# Patient Record
Sex: Female | Born: 1940
Health system: Southern US, Community
[De-identification: ages and names within clinical notes are randomized; demographics above are authoritative.]

## PROBLEM LIST (undated history)

## (undated) DIAGNOSIS — K219 Gastro-esophageal reflux disease without esophagitis: Secondary | ICD-10-CM

## (undated) DIAGNOSIS — C8590 Non-Hodgkin lymphoma, unspecified, unspecified site: Secondary | ICD-10-CM

## (undated) DIAGNOSIS — C8290 Follicular lymphoma, unspecified, unspecified site: Secondary | ICD-10-CM

## (undated) DIAGNOSIS — E785 Hyperlipidemia, unspecified: Secondary | ICD-10-CM

## (undated) DIAGNOSIS — I351 Nonrheumatic aortic (valve) insufficiency: Secondary | ICD-10-CM

## (undated) DIAGNOSIS — Z8589 Personal history of malignant neoplasm of other organs and systems: Secondary | ICD-10-CM

## (undated) DIAGNOSIS — I5189 Other ill-defined heart diseases: Secondary | ICD-10-CM

## (undated) DIAGNOSIS — Z87442 Personal history of urinary calculi: Secondary | ICD-10-CM

## (undated) DIAGNOSIS — I071 Rheumatic tricuspid insufficiency: Secondary | ICD-10-CM

## (undated) DIAGNOSIS — Z85828 Personal history of other malignant neoplasm of skin: Secondary | ICD-10-CM

## (undated) DIAGNOSIS — I340 Nonrheumatic mitral (valve) insufficiency: Secondary | ICD-10-CM

## (undated) DIAGNOSIS — F03A4 Unspecified dementia, mild, with anxiety: Secondary | ICD-10-CM

## (undated) DIAGNOSIS — C44311 Basal cell carcinoma of skin of nose: Secondary | ICD-10-CM

## (undated) DIAGNOSIS — J40 Bronchitis, not specified as acute or chronic: Secondary | ICD-10-CM

## (undated) DIAGNOSIS — Z86018 Personal history of other benign neoplasm: Secondary | ICD-10-CM

## (undated) DIAGNOSIS — I10 Essential (primary) hypertension: Secondary | ICD-10-CM

## (undated) DIAGNOSIS — K649 Unspecified hemorrhoids: Secondary | ICD-10-CM

## (undated) DIAGNOSIS — Z9289 Personal history of other medical treatment: Secondary | ICD-10-CM

## (undated) DIAGNOSIS — Z860100 Personal history of colon polyps, unspecified: Secondary | ICD-10-CM

## (undated) DIAGNOSIS — R011 Cardiac murmur, unspecified: Secondary | ICD-10-CM

## (undated) DIAGNOSIS — I4891 Unspecified atrial fibrillation: Secondary | ICD-10-CM

## (undated) DIAGNOSIS — G3184 Mild cognitive impairment, so stated: Secondary | ICD-10-CM

## (undated) DIAGNOSIS — C761 Malignant neoplasm of thorax: Secondary | ICD-10-CM

## (undated) HISTORY — DX: Basal cell carcinoma of skin of nose: C44.311

## (undated) HISTORY — DX: Personal history of colon polyps, unspecified: Z86.0100

## (undated) HISTORY — DX: Cardiac murmur, unspecified: R01.1

## (undated) HISTORY — DX: Bronchitis, not specified as acute or chronic: J40

## (undated) HISTORY — PX: OOPHORECTOMY: SHX86

## (undated) HISTORY — DX: Personal history of other malignant neoplasm of skin: Z85.828

## (undated) HISTORY — DX: Gastro-esophageal reflux disease without esophagitis: K21.9

## (undated) HISTORY — DX: Essential (primary) hypertension: I10

## (undated) HISTORY — DX: Personal history of other medical treatment: Z92.89

## (undated) HISTORY — DX: Personal history of colonic polyps: Z86.010

## (undated) HISTORY — DX: Unspecified hemorrhoids: K64.9

## (undated) HISTORY — DX: Personal history of urinary calculi: Z87.442

## (undated) HISTORY — DX: Malignant neoplasm of thorax: C76.1

## (undated) HISTORY — DX: Hyperlipidemia, unspecified: E78.5

## (undated) HISTORY — DX: Personal history of other benign neoplasm: Z86.018

## (undated) HISTORY — DX: Personal history of malignant neoplasm of other organs and systems: Z85.89

---

## 1969-05-15 HISTORY — PX: TONSILLECTOMY: SHX5217

## 1973-05-15 HISTORY — PX: TUBAL LIGATION: SHX77

## 1982-05-15 HISTORY — PX: ABDOMINAL HYSTERECTOMY: SHX81

## 1999-11-13 HISTORY — PX: VAGINAL PROLAPSE REPAIR: SHX830

## 2001-05-15 HISTORY — PX: COLONOSCOPY: SHX174

## 2001-12-05 DIAGNOSIS — Z86018 Personal history of other benign neoplasm: Secondary | ICD-10-CM

## 2001-12-05 HISTORY — DX: Personal history of other benign neoplasm: Z86.018

## 2002-05-15 DIAGNOSIS — C761 Malignant neoplasm of thorax: Secondary | ICD-10-CM

## 2002-05-15 HISTORY — PX: OTHER SURGICAL HISTORY: SHX169

## 2002-05-15 HISTORY — DX: Malignant neoplasm of thorax: C76.1

## 2003-05-16 HISTORY — PX: LITHOTRIPSY: SUR834

## 2003-06-01 HISTORY — PX: CHOLECYSTECTOMY: SHX55

## 2004-05-19 ENCOUNTER — Ambulatory Visit: Payer: Self-pay | Admitting: Urology

## 2004-05-22 ENCOUNTER — Emergency Department: Payer: Self-pay | Admitting: Internal Medicine

## 2004-11-07 ENCOUNTER — Ambulatory Visit: Payer: Self-pay | Admitting: Gastroenterology

## 2004-11-09 ENCOUNTER — Ambulatory Visit: Payer: Self-pay | Admitting: Gastroenterology

## 2004-12-08 ENCOUNTER — Ambulatory Visit: Payer: Self-pay | Admitting: Surgery

## 2005-02-07 DIAGNOSIS — Z8589 Personal history of malignant neoplasm of other organs and systems: Secondary | ICD-10-CM

## 2005-02-07 HISTORY — DX: Personal history of malignant neoplasm of other organs and systems: Z85.89

## 2005-10-31 ENCOUNTER — Ambulatory Visit: Payer: Self-pay | Admitting: Internal Medicine

## 2006-08-25 ENCOUNTER — Emergency Department: Payer: Self-pay | Admitting: Emergency Medicine

## 2006-11-08 ENCOUNTER — Ambulatory Visit: Payer: Self-pay | Admitting: Internal Medicine

## 2007-05-16 LAB — HM COLONOSCOPY: HM Colonoscopy: NORMAL

## 2007-07-31 ENCOUNTER — Ambulatory Visit: Payer: Self-pay | Admitting: Unknown Physician Specialty

## 2007-11-11 ENCOUNTER — Ambulatory Visit: Payer: Self-pay | Admitting: Gastroenterology

## 2008-02-11 ENCOUNTER — Ambulatory Visit: Payer: Self-pay | Admitting: Internal Medicine

## 2008-02-13 ENCOUNTER — Ambulatory Visit: Payer: Self-pay | Admitting: Internal Medicine

## 2008-05-15 HISTORY — PX: BLADDER SUSPENSION: SHX72

## 2008-11-20 ENCOUNTER — Ambulatory Visit: Payer: Self-pay | Admitting: Urology

## 2008-11-30 ENCOUNTER — Ambulatory Visit: Payer: Self-pay | Admitting: Urology

## 2009-10-20 ENCOUNTER — Ambulatory Visit: Payer: Self-pay | Admitting: Internal Medicine

## 2010-10-11 ENCOUNTER — Ambulatory Visit: Payer: Self-pay | Admitting: Internal Medicine

## 2010-10-26 ENCOUNTER — Ambulatory Visit: Payer: Self-pay | Admitting: Internal Medicine

## 2011-03-31 ENCOUNTER — Emergency Department: Payer: Self-pay | Admitting: *Deleted

## 2011-05-16 LAB — HM MAMMOGRAPHY: HM Mammogram: NORMAL

## 2011-06-17 ENCOUNTER — Ambulatory Visit: Payer: Self-pay | Admitting: Sports Medicine

## 2012-01-23 ENCOUNTER — Ambulatory Visit: Payer: Self-pay | Admitting: Internal Medicine

## 2012-04-03 ENCOUNTER — Ambulatory Visit: Payer: Self-pay | Admitting: Family Medicine

## 2012-09-30 ENCOUNTER — Ambulatory Visit: Payer: Self-pay | Admitting: General Surgery

## 2012-10-24 ENCOUNTER — Encounter: Payer: Self-pay | Admitting: General Surgery

## 2012-10-24 ENCOUNTER — Ambulatory Visit (INDEPENDENT_AMBULATORY_CARE_PROVIDER_SITE_OTHER): Payer: Medicare Other | Admitting: General Surgery

## 2012-10-24 VITALS — BP 140/78 | HR 72 | Resp 12 | Ht 63.0 in | Wt 146.0 lb

## 2012-10-24 DIAGNOSIS — Z1211 Encounter for screening for malignant neoplasm of colon: Secondary | ICD-10-CM | POA: Insufficient documentation

## 2012-10-24 DIAGNOSIS — Z86010 Personal history of colonic polyps: Secondary | ICD-10-CM

## 2012-10-24 LAB — POC HEMOCCULT BLD/STL (OFFICE/1-CARD/DIAGNOSTIC): Fecal Occult Blood, POC: NEGATIVE

## 2012-10-24 NOTE — Progress Notes (Signed)
Patient ID: Whitney Molina, female   DOB: [birth year], 72 y.o.   MRN: [id number]  Chief Complaint  Patient presents with  . Other    screening colonoscopy    HPI Whitney Molina is a 72 y.o. female who presents for a consult regarding a screening colonoscopy. Patient had her last colonoscopy in 2009 done by Dr. Mechele Collin. The patient reports that she has had a long history of intermittent difficulty with bowel movements and occasional hemorrhoids. On rare occasion she will see a spot of blood on the toilet tissue when wiping. This is markedly improved over last 2 years and she has begun a Gen. Exercise program. She had previously undergone hemorrhoid surgery with Dr. Katrinka Blazing.    HPI  Past Medical History  Diagnosis Date  . Bronchitis   . Hypertension   . Hyperlipidemia   . Cancer     skin  . Hemorrhoids     Past Surgical History  Procedure Laterality Date  . Tonsillectomy  1971  . Tubal ligation  1975  . Abdominal hysterectomy  1984  . Vaginal prolapse repair  July 2001    Pelvic Prolapse  . Cholecystectomy  06-01-03  . Lithotripsy  2005  . Bladder suspension  2010  . Colonoscopy  2009    Family History  Problem Relation Age of Onset  . Cancer Mother     breast   . Stroke Mother   . Stroke Father   . Cancer Brother     bladder  . Stroke Brother     Social History History  Substance Use Topics  . Smoking status: Never Smoker   . Smokeless tobacco: Never Used  . Alcohol Use: No    No Known Allergies  Current Outpatient Prescriptions  Medication Sig Dispense Refill  . amLODipine (NORVASC) 5 MG tablet Take 5 mg by mouth daily.      Marland Kitchen atorvastatin (LIPITOR) 20 MG tablet Take 20 mg by mouth 2 (two) times a week.      . Coenzyme Q10 (CO Q 10) 100 MG CAPS Take 1 capsule by mouth daily.      Marland Kitchen docusate calcium (SURFAK) 240 MG capsule Take 240 mg by mouth daily.      Marland Kitchen escitalopram (LEXAPRO) 10 MG tablet Take 10 mg by mouth daily.      . fish oil-omega-3  fatty acids 1000 MG capsule Take 2 g by mouth daily.      . hydrochlorothiazide (HYDRODIURIL) 25 MG tablet Take 25 mg by mouth as needed.      Marland Kitchen omeprazole (PRILOSEC) 40 MG capsule Take 40 mg by mouth daily.      . Probiotic Product (PROBIOTIC DAILY PO) Take 1 tablet by mouth daily.      . Red Yeast Rice Extract 600 MG CAPS Take 2 capsules by mouth daily.       No current facility-administered medications for this visit.    Review of Systems Review of Systems  Constitutional: Negative.   Respiratory: Negative.   Cardiovascular: Negative.   Gastrointestinal: Negative.     Blood pressure 140/78, pulse 72, resp. rate 12, height 5\' 3"  (1.6 m), weight 146 lb (66.225 kg).  Physical Exam Physical Exam  Constitutional: She appears well-developed and well-nourished.  Cardiovascular: Normal rate, regular rhythm and normal heart sounds.   Pulmonary/Chest: Breath sounds normal.  Lymphadenopathy:    She has no cervical adenopathy.  The external anal examination was unremarkable. Digital rectal exam was undertaken without discomfort. Normal sphincter  tone. No masses. Hemoccult-negative stool.  Data Reviewed The colonoscopy of 2009 showed a single polyp in the rectum. This was a hyperplastic polyp. The 2006 hemorrhoidectomy and polyp removal showed a hyperplastic polyp as well as hemorrhoidal varices with chronic inflammation.  Assessment    Hyperplastic polyps, no history of adenomatous polyps. No family history of colon cancer.     Plan    Unless the patient notices a significant change in her rare episodes of bright red blood on the toilet paper or change in her bowel function, a screening colonoscopy would not be indicated into 2019. She was encouraged to call if any changes occur in regards to her bowel function.        Whitney Molina 10/24/2012, 8:41 PM

## 2012-10-24 NOTE — Patient Instructions (Addendum)
Patient to have a colonoscopy  2019

## 2013-03-30 ENCOUNTER — Emergency Department: Payer: Self-pay | Admitting: Emergency Medicine

## 2013-03-30 LAB — URINALYSIS, COMPLETE
Bilirubin,UR: NEGATIVE
Blood: NEGATIVE
Glucose,UR: NEGATIVE mg/dL (ref 0–75)
Ketone: NEGATIVE
Leukocyte Esterase: NEGATIVE
Ph: 7 (ref 4.5–8.0)
Protein: NEGATIVE
RBC,UR: 1 /HPF (ref 0–5)
Specific Gravity: 1.008 (ref 1.003–1.030)
Squamous Epithelial: 3

## 2013-03-30 LAB — COMPREHENSIVE METABOLIC PANEL
Alkaline Phosphatase: 112 U/L (ref 50–136)
Anion Gap: 4 — ABNORMAL LOW (ref 7–16)
BUN: 16 mg/dL (ref 7–18)
Chloride: 101 mmol/L (ref 98–107)
Co2: 33 mmol/L — ABNORMAL HIGH (ref 21–32)
Creatinine: 0.98 mg/dL (ref 0.60–1.30)
EGFR (Non-African Amer.): 58 — ABNORMAL LOW
Potassium: 2.8 mmol/L — ABNORMAL LOW (ref 3.5–5.1)
SGOT(AST): 64 U/L — ABNORMAL HIGH (ref 15–37)
SGPT (ALT): 60 U/L (ref 12–78)
Sodium: 138 mmol/L (ref 136–145)

## 2013-03-30 LAB — CBC
HGB: 12.6 g/dL (ref 12.0–16.0)
RDW: 13.5 % (ref 11.5–14.5)
WBC: 21.8 10*3/uL — ABNORMAL HIGH (ref 3.6–11.0)

## 2013-04-04 LAB — CULTURE, BLOOD (SINGLE)

## 2013-08-06 ENCOUNTER — Telehealth: Payer: Self-pay

## 2013-08-06 NOTE — Telephone Encounter (Signed)
When appt is scheduled, please call [name] at [facility] at [phone] to keep her updated with the appt info.  Thank you.

## 2013-08-06 NOTE — Telephone Encounter (Signed)
Spoke with [name] at [facility].  Pt needs to be seen in [place] office with BQ but there is no rush on this appt.  She just needs to establish with pulmonary.  I Spoke to the pt and explained that we could get her in much quicker at the [place] office.  She prefers to wait until a later date at [place].  After we spoke, I saw that our schedule is open through May in the [place] office.  LMTCB X1 to offer pt a first available appt, currently looking at 5.4.15.  Please schedule pt for a 30 minute slot in [place] when she returns call.

## 2013-08-07 NOTE — Telephone Encounter (Signed)
LMTCB

## 2013-08-07 NOTE — Telephone Encounter (Signed)
[name] is returning Whitney Molina's call. [phone]

## 2013-08-07 NOTE — Telephone Encounter (Signed)
lmomtcb x1 for nicole-pt just needs to schedule consult appt with BQ in [place]

## 2013-08-07 NOTE — Telephone Encounter (Signed)
nicole called back ok to leave msg on her phone [phone]

## 2013-08-11 NOTE — Telephone Encounter (Signed)
Pt has appt scheduled for 09/15/13 @11  in [place].  LM for [name] at [facility] to make her aware.  Nothing further needed at this time.

## 2013-09-10 ENCOUNTER — Emergency Department: Payer: Self-pay | Admitting: Emergency Medicine

## 2013-09-10 LAB — COMPREHENSIVE METABOLIC PANEL
ALK PHOS: 78 U/L
Albumin: 3.5 g/dL (ref 3.4–5.0)
Anion Gap: 6 — ABNORMAL LOW (ref 7–16)
BUN: 13 mg/dL (ref 7–18)
Bilirubin,Total: 0.6 mg/dL (ref 0.2–1.0)
CALCIUM: 9.2 mg/dL (ref 8.5–10.1)
Chloride: 100 mmol/L (ref 98–107)
Co2: 32 mmol/L (ref 21–32)
Creatinine: 0.73 mg/dL (ref 0.60–1.30)
EGFR (African American): >60
EGFR (Non-African Amer.): >60
Glucose: 85 mg/dL (ref 65–99)
OSMOLALITY: 275 (ref 275–301)
Potassium: 3.3 mmol/L — ABNORMAL LOW (ref 3.5–5.1)
SGOT(AST): 23 U/L (ref 15–37)
SGPT (ALT): 28 U/L (ref 12–78)
Sodium: 138 mmol/L (ref 136–145)
Total Protein: 8.3 g/dL — ABNORMAL HIGH (ref 6.4–8.2)

## 2013-09-10 LAB — URINALYSIS, COMPLETE
BACTERIA: NONE SEEN
Bilirubin,UR: NEGATIVE
GLUCOSE, UR: NEGATIVE mg/dL (ref 0–75)
KETONE: NEGATIVE
Leukocyte Esterase: NEGATIVE
Nitrite: NEGATIVE
Ph: 8 (ref 4.5–8.0)
Protein: NEGATIVE
SPECIFIC GRAVITY: 1.006 (ref 1.003–1.030)
Squamous Epithelial: 1
WBC UR: 1 /HPF (ref 0–5)

## 2013-09-10 LAB — CBC
HCT: 39.5 % (ref 35.0–47.0)
HGB: 13.1 g/dL (ref 12.0–16.0)
MCH: 30.9 pg (ref 26.0–34.0)
MCHC: 33.1 g/dL (ref 32.0–36.0)
MCV: 93 fL (ref 80–100)
Platelet: 233 10*3/uL (ref 150–440)
RBC: 4.23 10*6/uL (ref 3.80–5.20)
RDW: 13.2 % (ref 11.5–14.5)
WBC: 6.9 10*3/uL (ref 3.6–11.0)

## 2013-09-15 ENCOUNTER — Encounter: Payer: Self-pay | Admitting: Pulmonary Disease

## 2013-09-15 ENCOUNTER — Ambulatory Visit (INDEPENDENT_AMBULATORY_CARE_PROVIDER_SITE_OTHER): Payer: Medicare Other | Admitting: Pulmonary Disease

## 2013-09-15 VITALS — BP 138/68 | HR 77 | Ht 63.0 in | Wt 141.0 lb

## 2013-09-15 DIAGNOSIS — J479 Bronchiectasis, uncomplicated: Secondary | ICD-10-CM

## 2013-09-15 DIAGNOSIS — R0609 Other forms of dyspnea: Secondary | ICD-10-CM

## 2013-09-15 DIAGNOSIS — R0989 Other specified symptoms and signs involving the circulatory and respiratory systems: Secondary | ICD-10-CM

## 2013-09-15 DIAGNOSIS — R0600 Dyspnea, unspecified: Secondary | ICD-10-CM

## 2013-09-15 DIAGNOSIS — R059 Cough, unspecified: Secondary | ICD-10-CM | POA: Insufficient documentation

## 2013-09-15 DIAGNOSIS — R05 Cough: Secondary | ICD-10-CM

## 2013-09-15 MED ORDER — AEROCHAMBER MV MISC: Status: DC

## 2013-09-15 NOTE — Patient Instructions (Signed)
Take the symbicort twice a day with a spacer Bring [place] a sample of your sputum (phlegm) when you can produce some We will set up a lung function test for you We will see you back in 3-4 months or sooner if needed

## 2013-09-15 NOTE — Assessment & Plan Note (Signed)
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  Plan: -As outlined above

## 2013-09-15 NOTE — Assessment & Plan Note (Signed)
I think her cough is multifactorial: Postnasal drip from allergic rhinitis, acid reflux, and chronic bronchitis are all contributing.  I agree completely with the plan of action outlined by Dr. Richardson Landry. She should continue acid reflux lifestyle modification, taking the proton pump inhibitor, and continue with plans for upcoming allergy testing.  I do question whether or not she has some degree of chronic bronchitis or even bronchiectasis. There appeared to be some very mild bronchiectasis on the CT chest which I reviewed. There was bronchial thickening noted. There is also significant amount of mucus plugging in the left lower lobe. She denies aspiration. 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 Today in clinic she described a variety of chemical agents which could have contributed to airways disease.  I also question whether or not she could have a chronic respiratory infection with Mycobacterium avium complex.  Plan: -Obtain full pulmonary function testing -Continue Symbicort, advised to use twice a day and with a spacer -Sputum culture> bacterial fungal and AFB - Continue care with Dr. Richardson Landry

## 2013-09-15 NOTE — Progress Notes (Signed)
Subjective:    Patient ID: Whitney Molina, female    DOB: [birth year], 73 y.o.   MRN: [id number]  HPI  This is a very pleasant 73 year old female who is referred to [place] by Dr. Richardson Landry with otolaryngology here in town. She has had a cough for 3-4 years which is just not gone away. She states that she never smoked cigarettes and has never been told in the past that she has a lung problem. She had a normal childhood without respiratory illnesses or asthma. She has never had a bad case of pneumonia that she can remember.  For the last 3-4 years she has had ongoing cough with mucus production. This illness has been characterized by periodic flareups which required treatment with antibiotics. She has never had a hospital for admission but she has had multiple ER visits for this. She says that even when she is doing well she feels like there is mucus in her chest that she needs to cough out. Often the mucus will be green to black in color. Typically antibiotics will clear it up. She does have sinus congestion and postnasal drip as well as acid reflux symptoms. She has recently started seeing [facility] and has been treated with a proton pump inhibitor which has helped with the acid reflux. However, she continues to have cough with mucus production on a daily basis.  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   Past Medical History  Diagnosis Date  . Bronchitis   . Hypertension   . Hyperlipidemia   .  Cancer     skin  . Hemorrhoids      Family History  Problem Relation Age of Onset  . Cancer Mother     breast   . Stroke Mother   . Stroke Father   . Cancer Brother     bladder  . Stroke Brother      History   Social History  . Marital Status: Married    Spouse Name: N/A    Number of Children: N/A  . Years of Education: N/A   Occupational History  . Not on file.   Social History Main Topics  . Smoking status: Never Smoker   . Smokeless tobacco: Never Used  . Alcohol Use: No  . Drug Use: No  . Sexual Activity: Not on file   Other Topics Concern  . Not on file   Social History Narrative  . No narrative on file     No Known Allergies   Outpatient Prescriptions Prior to Visit  Medication Sig Dispense Refill  . amLODipine (NORVASC) 5 MG tablet Take 5 mg by mouth daily.      Marland Kitchen atorvastatin (LIPITOR) 20 MG tablet Take 20 mg by mouth 2 (two) times a week.      . docusate calcium (SURFAK) 240 MG capsule Take 240 mg by mouth daily.      Marland Kitchen escitalopram (LEXAPRO) 10 MG tablet Take 10 mg by mouth daily.      Marland Kitchen  hydrochlorothiazide (HYDRODIURIL) 25 MG tablet Take 25 mg by mouth as needed.      . Probiotic Product (PROBIOTIC DAILY PO) Take 1 tablet by mouth every other day.       . Red Yeast Rice Extract 600 MG CAPS Take 1 capsule by mouth every other day.       . Coenzyme Q10 (CO Q 10) 100 MG CAPS Take 1 capsule by mouth daily.      . fish oil-omega-3 fatty acids 1000 MG capsule Take 2 g by mouth daily.      Marland Kitchen omeprazole (PRILOSEC) 40 MG capsule Take 40 mg by mouth daily.       No facility-administered medications prior to visit.      Review of Systems  Constitutional: Negative for fever and unexpected weight change.  HENT: Positive for congestion. Negative for dental problem, ear pain, nosebleeds, postnasal drip, rhinorrhea, sinus pressure, sneezing, sore throat and trouble swallowing.   Eyes: Negative for redness and itching.  Respiratory: Positive for cough and  shortness of breath. Negative for chest tightness and wheezing.   Cardiovascular: Negative for palpitations and leg swelling.  Gastrointestinal: Negative for nausea and vomiting.  Genitourinary: Negative for dysuria.  Musculoskeletal: Negative for joint swelling.  Skin: Negative for rash.  Neurological: Negative for headaches.  Hematological: Does not bruise/bleed easily.  Psychiatric/Behavioral: Negative for dysphoric mood. The patient is not nervous/anxious.        Objective:   Physical Exam Filed Vitals:   09/15/13 1113  BP: 138/68  Pulse: 77  Height: 5\' 3"  (1.6 m)  Weight: 141 lb (63.957 kg)  SpO2: 97%  RA  Gen: well appearing, no acute distress HEENT: NCAT, PERRL, EOMi, OP clear, neck supple without masses PULM: crackles left base greater than right, no wheezes CV: RRR, slight systolic murmur, no JVD AB: BS+, soft, nontender, no hsm Ext: warm, trace ankle edema, no clubbing, no cyanosis Derm: no rash or skin breakdown Neuro: A&Ox4, CN II-XII intact, strength 5/5 in all 4 extremities        Assessment & Plan:   Cough I think her cough is multifactorial: Postnasal drip from allergic rhinitis, acid reflux, and chronic bronchitis are all contributing.  I agree completely with the plan of action outlined by Dr. Richardson Landry. She should continue acid reflux lifestyle modification, taking the proton pump inhibitor, and continue with plans for upcoming allergy testing.  I do question whether or not she has some degree of chronic bronchitis or even bronchiectasis. There appeared to be some very mild bronchiectasis on the CT chest which I reviewed. There was bronchial thickening noted. There is also significant amount of mucus plugging in the left lower lobe. She denies aspiration. 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 Today in clinic she described a variety of chemical agents which  could have contributed to airways disease.  I also question whether or not she could have a chronic respiratory infection with Mycobacterium avium complex.  Plan: -Obtain full pulmonary function testing -Continue Symbicort, advised to use twice a day and with a spacer -Sputum culture> bacterial fungal and AFB - Continue care with Dr. Richardson Landry  Bronchiectasis  I think she has either mild bronchiectasis or chronic bronchitis related to years of inhalational injury from working with cleaning solutions.  Plan: -As outlined above   Updated Medication List Outpatient Encounter Prescriptions as of 09/15/2013  Medication Sig  . amLODipine (NORVASC)  5 MG tablet Take 5 mg by mouth daily.  Marland Kitchen atorvastatin (LIPITOR) 20 MG tablet Take 20 mg by mouth 2 (two) times a week.  . docusate calcium (SURFAK) 240 MG capsule Take 240 mg by mouth daily.  Marland Kitchen escitalopram (LEXAPRO) 10 MG tablet Take 10 mg by mouth daily.  Marland Kitchen guaiFENesin-codeine (ROBITUSSIN AC) 100-10 MG/5ML syrup Take 5 mLs by mouth at bedtime as needed for cough.  . hydrochlorothiazide (HYDRODIURIL) 25 MG tablet Take 25 mg by mouth as needed.  . pantoprazole (PROTONIX) 40 MG tablet Take 40 mg by mouth daily.  . Probiotic Product (PROBIOTIC DAILY PO) Take 1 tablet by mouth every other day.   . Red Yeast Rice Extract 600 MG CAPS Take 1 capsule by mouth every other day.   Marland Kitchen Spacer/Aero-Holding Chambers (AEROCHAMBER MV) inhaler Use as instructed  . [DISCONTINUED] Coenzyme Q10 (CO Q 10) 100 MG CAPS Take 1 capsule by mouth daily.  . [DISCONTINUED] fish oil-omega-3 fatty acids 1000 MG capsule Take 2 g by mouth daily.  . [DISCONTINUED] omeprazole (PRILOSEC) 40 MG capsule Take 40 mg by mouth daily.

## 2013-09-22 ENCOUNTER — Ambulatory Visit: Payer: Self-pay | Admitting: Pulmonary Disease

## 2013-10-03 ENCOUNTER — Ambulatory Visit (INDEPENDENT_AMBULATORY_CARE_PROVIDER_SITE_OTHER): Payer: Medicare Other | Admitting: Adult Health

## 2013-10-03 ENCOUNTER — Encounter: Payer: Self-pay | Admitting: Adult Health

## 2013-10-03 VITALS — BP 129/73 | HR 68 | Temp 98.2°F | Resp 14 | Ht 63.0 in | Wt 141.2 lb

## 2013-10-03 DIAGNOSIS — Z1239 Encounter for other screening for malignant neoplasm of breast: Secondary | ICD-10-CM

## 2013-10-03 DIAGNOSIS — Z23 Encounter for immunization: Secondary | ICD-10-CM

## 2013-10-03 MED ORDER — ZOSTER VACCINE LIVE 19400 UNT/0.65ML ~~LOC~~ SOLR: 0.6500 mL | Freq: Once | SUBCUTANEOUS | Status: DC

## 2013-10-03 NOTE — Patient Instructions (Signed)
   Call to schedule your mammogram at [facility].  I have sent in the prescription for the shingles vaccine to [facility] for them to administer.  Please schedule your Medicare Wellness Exam for August.  Have a wonderful summer. Call with any questions or concerns.

## 2013-10-03 NOTE — Progress Notes (Signed)
Pre visit review using our clinic review tool, if applicable. No additional management support is needed unless otherwise documented below in the visit note. 

## 2013-10-03 NOTE — Progress Notes (Signed)
Patient ID: Whitney Molina, female   DOB: 27-Jan-1941, 73 y.o.   MRN: [id number]    Subjective:    Patient ID: Whitney Molina, female    DOB: 05/19/1940, 73 y.o.   MRN: [id number]  HPI Pt is a pleasant 73 y/o female who presents to establish care. Previously followed by Dr. Benita Stabile. She has recently established with Dr. Lake Bells and reports feeling so good that she "has to pinch herself to make sure it is her". She has had hx of chronic episodes of bronchitis on multiple antibiotics but never really improving. When she started seeing Dr. Lake Bells and medications were changed she reports having a significant improvement. She is active and feels very well. Medicare Wellness exam will be due in August. She missed her mammogram last year 2/2 taking care of her mother, so she would like to get that done as soon as possible. No longer needs PAP since she is s/p total hysterectomy without any previous hx of cervical cancer. She is up to date on her vaccine except for the shingles and I will send in a prescription to her pharmacy today for administration.    Past Medical History  Diagnosis Date  . Bronchitis   . Hypertension   . Hyperlipidemia   . Cancer     skin  . Hemorrhoids   . Heart murmur   . GERD (gastroesophageal reflux disease)   . History of kidney stones   . History of colon polyps      Past Surgical History  Procedure Laterality Date  . Tonsillectomy  1971  . Tubal ligation  1975  . Abdominal hysterectomy  1984  . Vaginal prolapse repair  July 2001    Pelvic Prolapse  . Cholecystectomy  06-01-03  . Lithotripsy  2005  . Bladder suspension  2010     Family History  Problem Relation Age of Onset  . Cancer Mother     breast   . Stroke Mother   . Stroke Father   . Cancer Brother     bladder  . Stroke Brother      History   Social History  . Marital Status: Married    Spouse Name: N/A    Number of Children: N/A  . Years of Education: N/A    Occupational History  . Not on file.   Social History Main Topics  . Smoking status: Never Smoker   . Smokeless tobacco: Never Used  . Alcohol Use: No  . Drug Use: No  . Sexual Activity: Not on file   Other Topics Concern  . Not on file   Social History Narrative  . No narrative on file     Current Outpatient Prescriptions on File Prior to Visit  Medication Sig Dispense Refill  . amLODipine (NORVASC) 5 MG tablet Take 5 mg by mouth daily.      Marland Kitchen atorvastatin (LIPITOR) 20 MG tablet Take 20 mg by mouth 2 (two) times a week.      . docusate calcium (SURFAK) 240 MG capsule Take 240 mg by mouth daily.      Marland Kitchen escitalopram (LEXAPRO) 10 MG tablet Take 10 mg by mouth daily.      . hydrochlorothiazide (HYDRODIURIL) 25 MG tablet Take 25 mg by mouth as needed.      . pantoprazole (PROTONIX) 40 MG tablet Take 40 mg by mouth daily.      . Probiotic Product (PROBIOTIC DAILY PO) Take 1 tablet by mouth every other day.       Marland Kitchen  Red Yeast Rice Extract 600 MG CAPS Take 1 capsule by mouth every other day.       Marland Kitchen Spacer/Aero-Holding Chambers (AEROCHAMBER MV) inhaler Use as instructed  1 each  0   No current facility-administered medications on file prior to visit.     Review of Systems  Constitutional: Negative.   HENT: Negative.   Eyes: Negative.   Respiratory: Negative.   Cardiovascular: Negative.   Gastrointestinal: Negative.   Endocrine: Negative.   Genitourinary: Negative.   Musculoskeletal: Negative.   Skin: Negative.   Allergic/Immunologic: Negative.   Neurological: Negative.   Hematological: Negative.   Psychiatric/Behavioral: Negative.        Objective:  BP 129/73  Pulse 68  Temp(Src) 98.2 F (36.8 C) (Oral)  Resp 14  Ht 5\' 3"  (1.6 m)  Wt 141 lb 4 oz (64.071 kg)  BMI 25.03 kg/m2  SpO2 96%   Physical Exam  Constitutional: She is oriented to person, place, and time. No distress.  HENT:  Head: Normocephalic and atraumatic.  Eyes: Conjunctivae and EOM are normal.   Neck: Normal range of motion. Neck supple.  Cardiovascular: Normal rate, regular rhythm, normal heart sounds and intact distal pulses.  Exam reveals no gallop and no friction rub.   No murmur heard. Pulmonary/Chest: Effort normal and breath sounds normal. No respiratory distress. She has no wheezes. She has no rales.  Musculoskeletal: Normal range of motion.  Neurological: She is alert and oriented to person, place, and time. She has normal reflexes. Coordination normal.  Skin: Skin is warm and dry.  Psychiatric: She has a normal mood and affect. Her behavior is normal. Judgment and thought content normal.      Assessment & Plan:   1. Screening for breast cancer Order for mammogram. She will self schedule at [facility]; Future  2. Need for shingles vaccine Prescription sent to [facility].

## 2013-10-14 ENCOUNTER — Encounter: Payer: Self-pay | Admitting: Pulmonary Disease

## 2013-10-15 ENCOUNTER — Other Ambulatory Visit: Payer: Self-pay

## 2013-10-15 ENCOUNTER — Telehealth: Payer: Self-pay

## 2013-10-15 MED ORDER — BUDESONIDE-FORMOTEROL FUMARATE 160-4.5 MCG/ACT IN AERO: 2.0000 | INHALATION_SPRAY | Freq: Two times a day (BID) | RESPIRATORY_TRACT | Status: DC

## 2013-10-15 NOTE — Telephone Encounter (Signed)
Message copied by Len Blalock on Wed Oct 15, 2013  9:31 AM ------      Message from: Juanito Doom      Created: Tue Oct 14, 2013  9:40 PM       A,            Please let her know that her PFT did not show significant airflow obstruction which would be seen in severe bronchiectasis.  This is good.            Thanks      B ------

## 2013-10-15 NOTE — Telephone Encounter (Signed)
Spoke with pt, she is aware of her results.  Also stated that she needed a refill on her symbicort that we had given her at the office.  I refilled this for her.  Nothing further needed at this time.

## 2013-11-19 ENCOUNTER — Ambulatory Visit: Payer: Self-pay | Admitting: Adult Health

## 2013-11-19 LAB — HM MAMMOGRAPHY: HM Mammogram: NORMAL

## 2013-12-01 ENCOUNTER — Encounter: Payer: Self-pay | Admitting: Adult Health

## 2013-12-16 ENCOUNTER — Encounter: Payer: Medicare Other | Admitting: Adult Health

## 2013-12-18 ENCOUNTER — Ambulatory Visit (INDEPENDENT_AMBULATORY_CARE_PROVIDER_SITE_OTHER): Payer: Medicare Other | Admitting: Adult Health

## 2013-12-18 ENCOUNTER — Encounter: Payer: Self-pay | Admitting: Adult Health

## 2013-12-18 VITALS — BP 120/70 | HR 80 | Temp 98.3°F | Resp 14 | Ht 63.0 in | Wt 145.5 lb

## 2013-12-18 DIAGNOSIS — Z131 Encounter for screening for diabetes mellitus: Secondary | ICD-10-CM

## 2013-12-18 DIAGNOSIS — Z0000 Encounter for general adult medical examination without abnormal findings: Secondary | ICD-10-CM

## 2013-12-18 DIAGNOSIS — Z13820 Encounter for screening for osteoporosis: Secondary | ICD-10-CM

## 2013-12-18 DIAGNOSIS — Z136 Encounter for screening for cardiovascular disorders: Secondary | ICD-10-CM

## 2013-12-18 NOTE — Progress Notes (Signed)
Pre visit review using our clinic review tool, if applicable. No additional management support is needed unless otherwise documented below in the visit note. 

## 2013-12-18 NOTE — Progress Notes (Signed)
Subjective:    Whitney Molina is a 73 y.o. female who presents for Medicare Annual/Subsequent preventive examination.  Preventive Screening-Counseling & Management  Tobacco History  Smoking status  . Never Smoker   Smokeless tobacco  . Never Used     Problems Prior to Visit 1.   Current Problems (verified) Patient Active Problem List   Diagnosis Date Noted  . Cough 09/15/2013  . Bronchiectasis  09/15/2013  . History of colon polyps 10/24/2012    Medications Prior to Visit Current Outpatient Prescriptions on File Prior to Visit  Medication Sig Dispense Refill  . amLODipine (NORVASC) 5 MG tablet Take 5 mg by mouth daily.      Marland Kitchen atorvastatin (LIPITOR) 20 MG tablet Take 20 mg by mouth 2 (two) times a week.      . budesonide-formoterol (SYMBICORT) 160-4.5 MCG/ACT inhaler Inhale 2 puffs into the lungs 2 (two) times daily.  1 Inhaler  6  . docusate calcium (SURFAK) 240 MG capsule Take 240 mg by mouth daily.      Marland Kitchen escitalopram (LEXAPRO) 10 MG tablet Take 10 mg by mouth daily.      . fluticasone (FLONASE) 50 MCG/ACT nasal spray Place 2 sprays into both nostrils daily as needed for allergies or rhinitis.      . hydrochlorothiazide (HYDRODIURIL) 25 MG tablet Take 25 mg by mouth as needed.      . pantoprazole (PROTONIX) 40 MG tablet Take 40 mg by mouth daily.      . Probiotic Product (PROBIOTIC DAILY PO) Take 1 tablet by mouth every other day.       . Red Yeast Rice Extract 600 MG CAPS Take 1 capsule by mouth every other day.       Marland Kitchen Spacer/Aero-Holding Chambers (AEROCHAMBER MV) inhaler Use as instructed  1 each  0  . zoster vaccine live, PF, (ZOSTAVAX) 10272 UNT/0.65ML injection Inject 19,400 Units into the skin once.  1 each  0   No current facility-administered medications on file prior to visit.    Current Medications (verified) Current Outpatient Prescriptions  Medication Sig Dispense Refill  . amLODipine (NORVASC) 5 MG tablet Take 5 mg by mouth daily.      Marland Kitchen  atorvastatin (LIPITOR) 20 MG tablet Take 20 mg by mouth 2 (two) times a week.      . budesonide-formoterol (SYMBICORT) 160-4.5 MCG/ACT inhaler Inhale 2 puffs into the lungs 2 (two) times daily.  1 Inhaler  6  . docusate calcium (SURFAK) 240 MG capsule Take 240 mg by mouth daily.      Marland Kitchen escitalopram (LEXAPRO) 10 MG tablet Take 10 mg by mouth daily.      . fluticasone (FLONASE) 50 MCG/ACT nasal spray Place 2 sprays into both nostrils daily as needed for allergies or rhinitis.      . hydrochlorothiazide (HYDRODIURIL) 25 MG tablet Take 25 mg by mouth as needed.      . pantoprazole (PROTONIX) 40 MG tablet Take 40 mg by mouth daily.      . Probiotic Product (PROBIOTIC DAILY PO) Take 1 tablet by mouth every other day.       . Red Yeast Rice Extract 600 MG CAPS Take 1 capsule by mouth every other day.       Marland Kitchen Spacer/Aero-Holding Chambers (AEROCHAMBER MV) inhaler Use as instructed  1 each  0  . zoster vaccine live, PF, (ZOSTAVAX) 53664 UNT/0.65ML injection Inject 19,400 Units into the skin once.  1 each  0   No current facility-administered  medications for this visit.     Allergies (verified) Review of patient's allergies indicates no known allergies.   PAST HISTORY  Family History Family History  Problem Relation Age of Onset  . Cancer Mother     breast   . Stroke Mother   . Stroke Father   . Cancer Brother     bladder  . Stroke Brother     Social History History  Substance Use Topics  . Smoking status: Never Smoker   . Smokeless tobacco: Never Used  . Alcohol Use: No     Are there smokers in your home (other than you)? No  Risk Factors Current exercise habits: The patient does not participate in regular exercise at present.  Dietary issues discussed: eats healthy diet. Small, frequent meals. Fruits, vegetables, lean protein   Cardiac risk factors: advanced age (older than 44 for men, 92 for women), dyslipidemia and hypertension.  Depression Screen (Note: if answer to either  of the following is "Yes", a more complete depression screening is indicated)   Over the past two weeks, have you felt down, depressed or hopeless? No  Over the past two weeks, have you felt little interest or pleasure in doing things? No  Have you lost interest or pleasure in daily life? No  Do you often feel hopeless? No  Do you cry easily over simple problems? No  Activities of Daily Living In your present state of health, do you have any difficulty performing the following activities?:  Driving? No Managing money?  No Feeding yourself? No Getting from bed to chair? NoNo exam performed today, wellness exam Climbing a flight of stairs? No Preparing food and eating?: No Bathing or showering? No Getting dressed: No Getting to the toilet? No Using the toilet:No Moving around from place to place: No In the past year have you fallen or had a near fall?:No   Are you sexually active?  Yes  Do you have more than one partner?  No  Hearing Difficulties: No Do you often ask people to speak up or repeat themselves? No Do you experience ringing or noises in your ears? No Do you have difficulty understanding soft or whispered voices? No   Do you feel that you have a problem with memory? No  Do you often misplace items? No  Do you feel safe at home?  Yes  Cognitive Testing  Alert? Yes  Normal Appearance?Yes  Oriented to person? Yes  Place? Yes   Time? Yes  Recall of three objects?  Yes  Can perform simple calculations? Yes  Displays appropriate judgment?Yes  Can read the correct time from a watch face?Yes   Advanced Directives have been discussed with the patient? No  List the Names of Other Physician/Practitioners you currently use: 1.    Indicate any recent Medical Services you may have received from other than [facility] providers in the past year (date may be approximate).  Immunization History  Administered Date(s) Administered  . Influenza Split 01/16/2013  .  Pneumococcal-Unspecified 05/16/2011  . Td 11/12/2012    Screening Tests Health Maintenance  Topic Date Due  . Zostavax  07/28/2001  . Influenza Vaccine  12/13/2013  . Mammogram  11/20/2015  . Colonoscopy  05/15/2017  . Tetanus/tdap  11/13/2022  . Pneumococcal Polysaccharide Vaccine Age 31 And Over  Completed    All answers were reviewed with the patient and necessary referrals were made:  Rey,Raquel, NP   12/18/2013   History reviewed: allergies, current medications, past family  history, past medical history, past social history, past surgical history and problem list  Review of Systems Medicare Wellness Exam    Objective:     Vision by Snellen chart: right eye:20/20, left eye:20/20 Uses glasses and sees well. Bifocals for reading.  Body mass index is 25.78 kg/(m^2). BP 120/70  Pulse 80  Temp(Src) 98.3 F (36.8 C) (Oral)  Resp 14  Ht 5\' 3"  (1.6 m)  Wt 145 lb 8 oz (65.998 kg)  BMI 25.78 kg/m2  SpO2 95%  No exam performed today, Medicare Wellness.     Assessment:      This is a routine wellness  examination for this patient . I reviewed all health maintenance protocols including mammography, colonoscopy, bone density Needed referrals were placed. Age and diagnosis  appropriate screening labs were ordered. Her immunization history was reviewed and appropriate vaccinations were ordered. Her current medications and allergies were reviewed and needed refills of her chronic medications were ordered. The plan for yearly health maintenance was discussed all orders and referrals were made as appropriate.      Plan:     During the course of the visit the patient was educated and counseled about appropriate screening and preventive services including:    Bone densitometry screening  Diabetes screening  Advanced directives: has NO advanced directive  - add't info requested. Referral to SW: no  HLD  Diet review for nutrition referral? Yes ____  Not Indicated  __x__   Patient Instructions (the written plan) was given to the patient.  Medicare Attestation I have personally reviewed: The patient's medical and social history Their use of alcohol, tobacco or illicit drugs Their current medications and supplements The patient's functional ability including ADLs,fall risks, home safety risks, cognitive, and hearing and visual impairment Diet and physical activities Evidence for depression or mood disorders  The patient's weight, height, BMI, and visual acuity have been recorded in the chart.  I have made referrals, counseling, and provided education to the patient based on review of the above and I have provided the patient with a written personalized care plan for preventive services.     Rey,Raquel, NP   12/18/2013

## 2013-12-18 NOTE — Patient Instructions (Signed)
  You had your medicare wellness exam today.  You will have this exam for free once yearly.  Please return for your fasting labs to check cholesterol and blood sugar/electrolytes.   Please see Hoyle Sauer today to schedule your bone density.

## 2013-12-22 ENCOUNTER — Encounter: Payer: Self-pay | Admitting: *Deleted

## 2013-12-22 ENCOUNTER — Other Ambulatory Visit (INDEPENDENT_AMBULATORY_CARE_PROVIDER_SITE_OTHER): Payer: Medicare Other

## 2013-12-22 DIAGNOSIS — Z0000 Encounter for general adult medical examination without abnormal findings: Secondary | ICD-10-CM

## 2013-12-22 LAB — LIPID PANEL
CHOL/HDL RATIO: 5
Cholesterol: 176 mg/dL (ref 0–200)
HDL: 34 mg/dL — AB (ref >39.00)
LDL CALC: 118 mg/dL — AB (ref 0–99)
NONHDL: 142
Triglycerides: 119 mg/dL (ref 0.0–149.0)
VLDL: 23.8 mg/dL (ref 0.0–40.0)

## 2013-12-23 ENCOUNTER — Encounter: Payer: Self-pay | Admitting: *Deleted

## 2013-12-23 LAB — BASIC METABOLIC PANEL WITH GFR
BUN: 15 mg/dL (ref 6–23)
CO2: 29 mEq/L (ref 19–32)
Calcium: 9 mg/dL (ref 8.4–10.5)
Chloride: 101 mEq/L (ref 96–112)
Creat: 0.96 mg/dL (ref 0.50–1.10)
GFR, Est African American: 68 mL/min
GFR, Est Non African American: 59 mL/min — ABNORMAL LOW
GLUCOSE: 91 mg/dL (ref 70–99)
POTASSIUM: 3.5 meq/L (ref 3.5–5.3)
Sodium: 140 mEq/L (ref 135–145)

## 2014-02-03 ENCOUNTER — Other Ambulatory Visit: Payer: Self-pay | Admitting: *Deleted

## 2014-02-03 MED ORDER — HYDROCHLOROTHIAZIDE 25 MG PO TABS: 25.0000 mg | ORAL_TABLET | Freq: Every day | ORAL | Status: DC

## 2014-02-04 ENCOUNTER — Encounter: Payer: Self-pay | Admitting: *Deleted

## 2014-02-04 ENCOUNTER — Ambulatory Visit: Payer: Self-pay | Admitting: Adult Health

## 2014-02-04 LAB — HM DEXA SCAN

## 2014-02-05 ENCOUNTER — Telehealth: Payer: Self-pay | Admitting: Internal Medicine

## 2014-02-05 DIAGNOSIS — M810 Age-related osteoporosis without current pathological fracture: Secondary | ICD-10-CM

## 2014-02-05 NOTE — Telephone Encounter (Signed)
Whitney Molina's patinet had a DEXA that was diagnostic for osteoporosis in the right hip.  She can make an appt to discuss therapy.  She will be assigned a new provider once ouyr new nurse practictioner starts, so she can wait until then if she prefers/   I recommend getting the majority of your calcium and Vitamin D  through diet rather than supplements given the recent association of calcium supplements with increased coronary artery calcium scores (You need 1800 mg daily of calcium and 1000 units of Vitamin D  )   Unsweetened almond/coconut milk is a great low calorie low carb, cholesterol free  way to increase your dietary calcium and vitamin D.

## 2014-02-09 NOTE — Telephone Encounter (Signed)
Pt notified and verbalized understanding. Will wait until already scheduled follow up appointment in February with Dr. Derrel Nip to discuss.

## 2014-02-10 ENCOUNTER — Telehealth: Payer: Self-pay | Admitting: Internal Medicine

## 2014-02-10 NOTE — Telephone Encounter (Signed)
docusate calcium (SURFAK) 240 MG capsule

## 2014-02-11 ENCOUNTER — Telehealth: Payer: Self-pay | Admitting: Internal Medicine

## 2014-02-11 MED ORDER — POTASSIUM CHLORIDE CRYS ER 20 MEQ PO TBCR: 20.0000 meq | EXTENDED_RELEASE_TABLET | Freq: Every day | ORAL | Status: DC

## 2014-02-11 MED ORDER — DOCUSATE CALCIUM 240 MG PO CAPS: 240.0000 mg | ORAL_CAPSULE | Freq: Every day | ORAL | Status: DC

## 2014-02-11 NOTE — Telephone Encounter (Signed)
Yes you can call in potassium chloride 20 MeQ one tablet daily with food.  #90 1 refill

## 2014-02-11 NOTE — Telephone Encounter (Signed)
Rx sent to pharmacy by escript, pt notified.

## 2014-02-11 NOTE — Telephone Encounter (Signed)
Patient staed she has always taken potassium and failed to notify at appointment please advise can I fill potassium 20 MeQ ?

## 2014-03-09 ENCOUNTER — Other Ambulatory Visit: Payer: Self-pay | Admitting: *Deleted

## 2014-03-09 MED ORDER — ESCITALOPRAM OXALATE 10 MG PO TABS: 10.0000 mg | ORAL_TABLET | Freq: Every day | ORAL | Status: DC

## 2014-03-16 ENCOUNTER — Encounter: Payer: Self-pay | Admitting: Adult Health

## 2014-03-23 ENCOUNTER — Other Ambulatory Visit: Payer: Self-pay

## 2014-03-23 MED ORDER — AMLODIPINE BESYLATE 5 MG PO TABS: 5.0000 mg | ORAL_TABLET | Freq: Every day | ORAL | Status: DC

## 2014-05-15 DIAGNOSIS — Z9289 Personal history of other medical treatment: Secondary | ICD-10-CM

## 2014-05-15 HISTORY — DX: Personal history of other medical treatment: Z92.89

## 2014-06-24 ENCOUNTER — Ambulatory Visit (INDEPENDENT_AMBULATORY_CARE_PROVIDER_SITE_OTHER): Payer: PPO | Admitting: Internal Medicine

## 2014-06-24 ENCOUNTER — Encounter: Payer: Self-pay | Admitting: Internal Medicine

## 2014-06-24 ENCOUNTER — Other Ambulatory Visit: Payer: Self-pay | Admitting: *Deleted

## 2014-06-24 VITALS — BP 150/84 | HR 77 | Temp 98.3°F | Resp 14 | Ht 63.0 in | Wt 145.0 lb

## 2014-06-24 DIAGNOSIS — Z23 Encounter for immunization: Secondary | ICD-10-CM

## 2014-06-24 DIAGNOSIS — N952 Postmenopausal atrophic vaginitis: Secondary | ICD-10-CM

## 2014-06-24 DIAGNOSIS — I129 Hypertensive chronic kidney disease with stage 1 through stage 4 chronic kidney disease, or unspecified chronic kidney disease: Secondary | ICD-10-CM | POA: Insufficient documentation

## 2014-06-24 DIAGNOSIS — E785 Hyperlipidemia, unspecified: Secondary | ICD-10-CM

## 2014-06-24 DIAGNOSIS — I10 Essential (primary) hypertension: Secondary | ICD-10-CM | POA: Insufficient documentation

## 2014-06-24 LAB — LIPID PANEL
Cholesterol: 165 mg/dL (ref 0–200)
HDL: 39.2 mg/dL (ref >39.00)
LDL Cholesterol: 104 mg/dL — ABNORMAL HIGH (ref 0–99)
NonHDL: 125.8
Total CHOL/HDL Ratio: 4
Triglycerides: 110 mg/dL (ref 0.0–149.0)
VLDL: 22 mg/dL (ref 0.0–40.0)

## 2014-06-24 LAB — COMPREHENSIVE METABOLIC PANEL
ALT: 22 U/L (ref 0–35)
AST: 21 U/L (ref 0–37)
Albumin: 4.3 g/dL (ref 3.5–5.2)
Alkaline Phosphatase: 81 U/L (ref 39–117)
BUN: 14 mg/dL (ref 6–23)
CO2: 28 mEq/L (ref 19–32)
Calcium: 9.5 mg/dL (ref 8.4–10.5)
Chloride: 103 mEq/L (ref 96–112)
Creatinine, Ser: 0.86 mg/dL (ref 0.40–1.20)
GFR: 68.76 mL/min (ref >60.00)
Glucose, Bld: 97 mg/dL (ref 70–99)
POTASSIUM: 3.8 meq/L (ref 3.5–5.1)
Sodium: 138 mEq/L (ref 135–145)
Total Bilirubin: 0.6 mg/dL (ref 0.2–1.2)
Total Protein: 7.9 g/dL (ref 6.0–8.3)

## 2014-06-24 MED ORDER — LOSARTAN POTASSIUM 100 MG PO TABS: 100.0000 mg | ORAL_TABLET | Freq: Every day | ORAL | Status: DC

## 2014-06-24 MED ORDER — PANTOPRAZOLE SODIUM 40 MG PO TBEC: 40.0000 mg | DELAYED_RELEASE_TABLET | Freq: Every day | ORAL | Status: DC

## 2014-06-24 MED ORDER — ATORVASTATIN CALCIUM 20 MG PO TABS: 20.0000 mg | ORAL_TABLET | ORAL | Status: DC

## 2014-06-24 MED ORDER — ESCITALOPRAM OXALATE 10 MG PO TABS
10.0000 mg | ORAL_TABLET | Freq: Every day | ORAL | Status: DC
Start: 2014-06-24 — End: 2015-01-25

## 2014-06-24 MED ORDER — HYOSCYAMINE SULFATE ER 0.375 MG PO TB12
0.3750 mg | ORAL_TABLET | Freq: Every day | ORAL | Status: DC
Start: 2014-06-24 — End: 2015-07-16

## 2014-06-24 MED ORDER — ATORVASTATIN CALCIUM 20 MG PO TABS: 20.0000 mg | ORAL_TABLET | Freq: Two times a day (BID) | ORAL | Status: DC

## 2014-06-24 NOTE — Progress Notes (Signed)
Pre-visit discussion using our clinic review tool. No additional management support is needed unless otherwise documented below in the visit note.  

## 2014-06-24 NOTE — Patient Instructions (Signed)
I am CHANGING YOUR MEDICATIONS TODAY  I AM STOPPING POTASSIUM,  AMLODIPINE AND HCTZ    I AM STARTING LOSARTAN FOR YOUR BLOOD PRESSURE ,  ONCE DAILY   YOU RECEIVED THE NEW PNEUMONIA VACCINE   PLEASE RETURN IN 1 WEEK FOR A POTASSIUM AND AND A BP CHECK   YOU CAN RESUME VAGINAL ESTROGEN NIGHTLY FOR 2 WEEKS,  THEN MAINTENANCE DOSE IS 2/WEEKLY.  THIS WILL HELP YOUR DISCOMFORT DURING INTERCOURSE

## 2014-06-24 NOTE — Progress Notes (Signed)
Patient ID: Whitney Molina, female   DOB: 09-09-1940, 74 y.o.   MRN: [id number]  Patient Active Problem List   Diagnosis Date Noted  . Hyperlipidemia LDL goal <100 06/26/2014  . Essential hypertension 06/24/2014  . Osteoporosis, unspecified 02/05/2014  . Cough 09/15/2013  . Bronchiectasis  09/15/2013  . History of colon polyps 10/24/2012    Subjective:  CC:   Chief Complaint  Patient presents with  . Follow-up    6 month, patient experiencing nose bleed patient has been using saline nasal spray.  . Fatigue    Patient wants to ask about the HCTZ. making her feel tired    HPI:   Whitney Molina is a 74 y.o. female who presents for 6 month follow up on multiple chronic conditions including hypertension, hyoperlipidemia  and GAD.    She has noted that daily use of hctz has been making her feel very drained and pre syncopal.  She is currently only taking it prn leg swelling ,  Not daily    She has been having recurrent nosebleeds which have been more frequent  over the winter months.  Has seen ENT  Who stopped flonase a few weeks ago.  She notes that she did not have one the ntire week she was staying in [facility].  Does not have a humidifier in her [place] home.   She has been taking Lexapro for her GAD with lexapro,  And finds that it helps keep her BP down  Vaginal dryness;  She has been using her premarin cream too infrequently and has been having discomfort during intercourse.  Denies discahrge and bleeding . Using it less than once a month, with intercourse.  Marland Kitchen     Past Medical History  Diagnosis Date  . Bronchitis   . Hypertension   . Hyperlipidemia   . Cancer     skin  . Hemorrhoids   . Heart murmur   . GERD (gastroesophageal reflux disease)   . History of kidney stones   . History of colon polyps     Past Surgical History  Procedure Laterality Date  . Tonsillectomy  1971  . Tubal ligation  1975  . Abdominal hysterectomy  1984  . Vaginal prolapse repair   July 2001    Pelvic Prolapse  . Cholecystectomy  06-01-03  . Lithotripsy  2005  . Bladder suspension  2010       The following portions of the patient's history were reviewed and updated as appropriate: Allergies, current medications, and problem list.    Review of Systems:   Patient denies headache, fevers, malaise, unintentional weight loss, skin rash, eye pain, sinus congestion and sinus pain, sore throat, dysphagia,  hemoptysis , cough, dyspnea, wheezing, chest pain, palpitations, orthopnea, edema, abdominal pain, nausea, melena, diarrhea, constipation, flank pain, dysuria, hematuria, urinary  Frequency, nocturia, numbness, tingling, seizures,  Focal weakness, Loss of consciousness,  Tremor, insomnia, depression, anxiety, and suicidal ideation.     History   Social History  . Marital Status: Married    Spouse Name: N/A  . Number of Children: N/A  . Years of Education: N/A   Occupational History  . Not on file.   Social History Main Topics  . Smoking status: Never Smoker   . Smokeless tobacco: Never Used  . Alcohol Use: No  . Drug Use: No  . Sexual Activity: Not on file   Other Topics Concern  . Not on file   Social History Narrative    Objective:  Filed Vitals:   06/24/14 0913  BP: 150/84  Pulse: 77  Temp: 98.3 F (36.8 C)  Resp: 14     General appearance: alert, cooperative and appears stated age Ears: normal TM's and external ear canals both ears Throat: lips, mucosa, and tongue normal; teeth and gums normal Neck: no adenopathy, no carotid bruit, supple, symmetrical, trachea midline and thyroid not enlarged, symmetric, no tenderness/mass/nodules Back: symmetric, no curvature. ROM normal. No CVA tenderness. Lungs: clear to auscultation bilaterally Heart: regular rate and rhythm, S1, S2 normal, no murmur, click, rub or gallop Abdomen: soft, non-tender; bowel sounds normal; no masses,  no organomegaly Pulses: 2+ and symmetric Skin: Skin color,  texture, turgor normal. No rashes or lesions Lymph nodes: Cervical, supraclavicular, and axillary nodes normal.  Assessment and Plan:  Problem List Items Addressed This Visit    Postmenopausal atrophic vaginitis    Advised to resume nightly use for two weeks,  Then continue twice weekly as maintenance.       Hyperlipidemia LDL goal <100    LDL and triglycerides are at goal on current medications. sHe has no side effects and liver enzymes are normal. No changes today.  Lab Results  Component Value Date   CHOL 165 06/24/2014   HDL 39.20 06/24/2014   LDLCALC 104* 06/24/2014   TRIG 110.0 06/24/2014   CHOLHDL 4 06/24/2014   Lab Results  Component Value Date   ALT 22 06/24/2014   AST 21 06/24/2014   ALKPHOS 81 06/24/2014   BILITOT 0.6 06/24/2014          Relevant Medications   losartan (COZAAR) tablet   atorvastatin (LIPITOR) tablet   Essential hypertension - Primary    changing regimen to simply and aviod side effects.  Stopping amlodipine, potassium and hctz.  Starting losartan.  Lab Results  Component Value Date   NA 138 06/24/2014   K 3.8 06/24/2014   CL 103 06/24/2014   CO2 28 06/24/2014   Lab Results  Component Value Date   CREATININE 0.86 06/24/2014         Relevant Medications   losartan (COZAAR) tablet   atorvastatin (LIPITOR) tablet   Other Relevant Orders   Comprehensive metabolic panel (Completed)    Other Visit Diagnoses    Hyperlipidemia        Relevant Medications    losartan (COZAAR) tablet    atorvastatin (LIPITOR) tablet    Other Relevant Orders    Lipid panel (Completed)    Need for prophylactic vaccination against Streptococcus pneumoniae (pneumococcus)        Relevant Orders    Pneumococcal conjugate vaccine 13-valent (Completed)      A total of 25 minutes of face to face time was spent with patient more than half of which was spent in counselling on the above mentioned issues.

## 2014-06-26 ENCOUNTER — Telehealth: Payer: Self-pay | Admitting: Internal Medicine

## 2014-06-26 ENCOUNTER — Encounter: Payer: Self-pay | Admitting: Internal Medicine

## 2014-06-26 DIAGNOSIS — N952 Postmenopausal atrophic vaginitis: Secondary | ICD-10-CM | POA: Insufficient documentation

## 2014-06-26 DIAGNOSIS — E785 Hyperlipidemia, unspecified: Secondary | ICD-10-CM | POA: Insufficient documentation

## 2014-06-26 MED ORDER — ATORVASTATIN CALCIUM 20 MG PO TABS: 40.0000 mg | ORAL_TABLET | Freq: Every day | ORAL | Status: DC

## 2014-06-26 NOTE — Assessment & Plan Note (Signed)
changing regimen to simply and aviod side effects.  Stopping amlodipine, potassium and hctz.  Starting losartan.  Lab Results  Component Value Date   NA 138 06/24/2014   K 3.8 06/24/2014   CL 103 06/24/2014   CO2 28 06/24/2014   Lab Results  Component Value Date   CREATININE 0.86 06/24/2014

## 2014-06-26 NOTE — Telephone Encounter (Signed)
emmi mailed  °

## 2014-06-26 NOTE — Assessment & Plan Note (Signed)
LDL and triglycerides are at goal on current medications. sHe has no side effects and liver enzymes are normal. No changes today.  Lab Results  Component Value Date   CHOL 165 06/24/2014   HDL 39.20 06/24/2014   LDLCALC 104* 06/24/2014   TRIG 110.0 06/24/2014   CHOLHDL 4 06/24/2014   Lab Results  Component Value Date   ALT 22 06/24/2014   AST 21 06/24/2014   ALKPHOS 81 06/24/2014   BILITOT 0.6 06/24/2014

## 2014-06-26 NOTE — Assessment & Plan Note (Signed)
Advised to resume nightly use for two weeks,  Then continue twice weekly as maintenance.

## 2014-07-01 ENCOUNTER — Other Ambulatory Visit: Payer: PPO

## 2014-07-16 ENCOUNTER — Telehealth: Payer: Self-pay | Admitting: Internal Medicine

## 2014-07-16 NOTE — Telephone Encounter (Signed)
Patient Name: Whitney Molina Gender: Female DOB: December 09, 1940 Age: 74 Y 11 M 16 D Return Phone Number: [phone] (Primary) Address: City/State/Zip: [place] [address] Client [facility] Client Site [place] - Day Physician [place], [place] Type Call Call Type Triage / Clinical Relationship To Patient Self Return Phone Number [phone] (Primary) Chief Complaint Headache Initial Comment Caller states just had BP med changed Feb 10th , has been getting high reading 150-165/ 89 -90's, having headaches, and doesn't feel well PreDisposition Call Doctor Nurse Assessment Nurse: Genoveva Ill, RN, Lattie Haw Date/Time (Eastern Time): 07/16/2014 2:35:53 PM Confirm and document reason for call. If symptomatic, describe symptoms. ---Caller states just had BP med changed Feb 10th , has been getting high reading daily, this am was 165/94, has mild h/a now and tired Has the patient traveled out of the country within the last 30 days? ---No Does the patient require triage? ---Yes Related visit to physician within the last 2 weeks? ---No Does the PT have any chronic conditions? (i.e. diabetes, asthma, etc.) ---Yes List chronic conditions. ---HTN Guidelines Guideline Title Affirmed Question Affirmed Notes Nurse Date/Time (Eastern Time) High Blood Pressure BP # 160/100 Burress, RN, Lattie Haw 07/16/2014 2:38:08 PM Disp. Time Eilene Ghazi Time) Disposition Final User 07/16/2014 2:43:25 PM See PCP When Office is Open (within 3 days) Yes Burress, RN, Leland Johns Understands: Yes Disagree/Comply: Comply PLEASE NOTE: All timestamps contained within this report are represented as [place] Standard Time. CONFIDENTIALTY NOTICE: This fax transmission is intended only for the addressee. It contains information that is legally privileged, confidential or otherwise protected from use or disclosure. If you are not the intended recipient, you  are strictly prohibited from reviewing, disclosing, copying using or disseminating any of this information or taking any action in reliance on or regarding this information. If you have received this fax in error, please notify us immediately by telephone so that we can arrange for its return to [place]. Phone: [phone], Toll-Free: [phone], Fax: [phone] Page: 2 of 2 Call Id: [id number] Care Advice Given Per Guideline SEE PCP WITHIN 3 DAYS: You need to be examined within 2 or 3 days. Call your doctor during regular office hours and make an appointment. (Note: if office will be open tomorrow, tell caller to call then, not in 3 days). * Untreated high blood pressure may cause damage to your heart, brain, kidneys, and eyes. * EAT HEALTHY: Eat a diet rich in fresh fruits and vegetables, dietary fiber, nonanimal protein (e.g., soy), and low-fat dairy products. Avoid foods with a high content of saturated fat or cholesterol. * DECREASE SODIUM INTAKE: Aim to eat less than 1,500 mg of sodium each day. Unfortunately 75% of the salt in the average person's diet is in pre-processed foods. * EXERCISE, BE MORE PHYSICALLY ACTIVE: Do 30-60 minutes of moderate intensity exercise four to seven times a week. Examples include aerobic activities like brisk walking, cycling, and swimming. * REDUCE WEIGHT AND WAIST LINE: It is important to maintain a normal body weight. The goal should be a BMI (body mass index) under 25 for men and women, a waist circumference under 40 inches (102 cm) in men, and a waist circumference under 35 inches (88 cm) in women. * REDUCE STRESS: Find activities that help reduce your stress. Examples might include meditation, yoga, or even a restful walk in a park. CALL BACK IF: * Weakness or numbness of the face, arm or leg on one side of the body occurs *  Difficulty walking, difficulty talking, or severe headache occurs * Chest pain or difficulty breathing occurs * Your blood pressure is  over 180/110 * You become worse. CARE ADVICE given per High Blood Pressure (Adult) guideline. After Care Instructions Given Call Event

## 2014-07-17 ENCOUNTER — Ambulatory Visit: Payer: Self-pay | Admitting: Nurse Practitioner

## 2014-07-20 ENCOUNTER — Ambulatory Visit (INDEPENDENT_AMBULATORY_CARE_PROVIDER_SITE_OTHER): Payer: PPO | Admitting: Internal Medicine

## 2014-07-20 ENCOUNTER — Encounter: Payer: Self-pay | Admitting: Internal Medicine

## 2014-07-20 VITALS — BP 160/92 | HR 71 | Temp 98.9°F | Resp 16 | Ht 63.0 in | Wt 141.5 lb

## 2014-07-20 DIAGNOSIS — R040 Epistaxis: Secondary | ICD-10-CM

## 2014-07-20 DIAGNOSIS — E785 Hyperlipidemia, unspecified: Secondary | ICD-10-CM

## 2014-07-20 DIAGNOSIS — I10 Essential (primary) hypertension: Secondary | ICD-10-CM

## 2014-07-20 MED ORDER — HYDROCHLOROTHIAZIDE 25 MG PO TABS: 25.0000 mg | ORAL_TABLET | Freq: Every day | ORAL | Status: DC

## 2014-07-20 NOTE — Progress Notes (Signed)
Pre-visit discussion using our clinic review tool. No additional management support is needed unless otherwise documented below in the visit note.  

## 2014-07-20 NOTE — Assessment & Plan Note (Signed)
Adding Hct 25 mg daily.  Return in one week for BP check re and BMET.

## 2014-07-20 NOTE — Patient Instructions (Addendum)
Continue the losartan and add 25 mg of hctz daily in the morning.  If you want to take the losartan at night  That's ok to split them up  If you tolerate them and the combination works,  We can combine them or keep them separate for night and day dosing   Return in one week to check your potassium level and have your BP rechecked.     Your cholesterol is fine alternating between red yeast rice and lipitor    If you want to stop the protonix,  You can use famotidine or ranitidine as needed for indigestion ,  Can be taken before or after meals

## 2014-07-20 NOTE — Progress Notes (Signed)
Patient ID: Whitney Molina, female   DOB: 1940-08-25, 74 y.o.   MRN: [id number] .   Patient Active Problem List   Diagnosis Date Noted  . Epistaxis 07/20/2014  . Hyperlipidemia LDL goal <100 06/26/2014  . Postmenopausal atrophic vaginitis 06/26/2014  . Essential hypertension 06/24/2014  . Osteoporosis, unspecified 02/05/2014  . Bronchiectasis  09/15/2013  . History of colon polyps 10/24/2012    Subjective:  CC:   Chief Complaint  Patient presents with  . Hypertension    Monitor at home AM 156/94    HPI:   Whitney Molina is a 74 y.o. female who presents for  Follow up on multiple issus   Saw [facility] for recurrent nosebleeds.  He  cautizerized nose,  But two days later had another severe episode and called 911 who stopped it, but it recurred again after EMS came out  Had to use AFrin nasal spray .  Using Barnes and an antibiotic ointment for MRSA,  Has not bled i n a week   BPs have been elevated for the past  2 weeks on losartan alone,  Has taken hctz intermittently .  Has been measuring at home. 148/98  Pulse 75,   Last week 159/85    137/87  150/90  156/94.  Not on medications for congestion      Hyperlipidemia:  Has been alternating between lipitor 20 mg and RYR daily to prevent leg cramps.  And lipids have improved.    Past Medical History  Diagnosis Date  . Bronchitis   . Hypertension   . Hyperlipidemia   . Cancer     skin  . Hemorrhoids   . Heart murmur   . GERD (gastroesophageal reflux disease)   . History of kidney stones   . History of colon polyps     Past Surgical History  Procedure Laterality Date  . Tonsillectomy  1971  . Tubal ligation  1975  . Abdominal hysterectomy  1984  . Vaginal prolapse repair  July 2001    Pelvic Prolapse  . Cholecystectomy  06-01-03  . Lithotripsy  2005  . Bladder suspension  2010       The following portions of the patient's history were reviewed and updated as appropriate: Allergies, current medications,  and problem list.    Review of Systems:   Patient denies headache, fevers, malaise, unintentional weight loss, skin rash, eye pain, sinus congestion and sinus pain, sore throat, dysphagia,  hemoptysis , cough, dyspnea, wheezing, chest pain, palpitations, orthopnea, edema, abdominal pain, nausea, melena, diarrhea, constipation, flank pain, dysuria, hematuria, urinary  Frequency, nocturia, numbness, tingling, seizures,  Focal weakness, Loss of consciousness,  Tremor, insomnia, depression, anxiety, and suicidal ideation.     History   Social History  . Marital Status: Married    Spouse Name: N/A  . Number of Children: N/A  . Years of Education: N/A   Occupational History  . Not on file.   Social History Main Topics  . Smoking status: Never Smoker   . Smokeless tobacco: Never Used  . Alcohol Use: No  . Drug Use: No  . Sexual Activity: Not on file   Other Topics Concern  . Not on file   Social History Narrative    Objective:  Filed Vitals:   07/20/14 0953  BP: 160/92  Pulse: 71  Temp: 98.9 F (37.2 C)  Resp: 16     General appearance: alert, cooperative and appears stated age Ears: normal TM's and external ear canals both  ears Throat: lips, mucosa, and tongue normal; teeth and gums normal Neck: no adenopathy, no carotid bruit, supple, symmetrical, trachea midline and thyroid not enlarged, symmetric, no tenderness/mass/nodules Back: symmetric, no curvature. ROM normal. No CVA tenderness. Lungs: clear to auscultation bilaterally Heart: regular rate and rhythm, S1, S2 normal, no murmur, click, rub or gallop Abdomen: soft, non-tender; bowel sounds normal; no masses,  no organomegaly Pulses: 2+ and symmetric Skin: Skin color, texture, turgor normal. No rashes or lesions Lymph nodes: Cervical, supraclavicular, and axillary nodes normal.  Assessment and Plan:  Essential hypertension Adding Hct 25 mg daily.  Return in one week for BP check re and BMET.     Hyperlipidemia LDL goal <100 LDL and triglycerides are at goal on current medications. She is alternating between lipitor and RYR daily to avoid leg cramps,  No changes today    Epistaxis Continue Ayr and antibacterial topical ointment.   A total of 25 minutes of face to face time was spent with patient more than half of which was spent in counselling about the above mentioned conditions, reviewing recent labs  and coordination of care   Updated Medication List Outpatient Encounter Prescriptions as of 07/20/2014  Medication Sig  . atorvastatin (LIPITOR) 20 MG tablet Take 2 tablets (40 mg total) by mouth at bedtime.  . budesonide-formoterol (SYMBICORT) 160-4.5 MCG/ACT inhaler Inhale 2 puffs into the lungs 2 (two) times daily.  Marland Kitchen docusate calcium (SURFAK) 240 MG capsule Take 1 capsule (240 mg total) by mouth daily.  Marland Kitchen escitalopram (LEXAPRO) 10 MG tablet Take 1 tablet (10 mg total) by mouth daily.  . hyoscyamine (LEVBID) 0.375 MG 12 hr tablet Take 1 tablet (0.375 mg total) by mouth daily.  Marland Kitchen losartan (COZAAR) 100 MG tablet Take 1 tablet (100 mg total) by mouth daily.  Marland Kitchen Spacer/Aero-Holding Chambers (AEROCHAMBER MV) inhaler Use as instructed  . hydrochlorothiazide (HYDRODIURIL) 25 MG tablet Take 1 tablet (25 mg total) by mouth daily.  Marland Kitchen zoster vaccine live, PF, (ZOSTAVAX) 44920 UNT/0.65ML injection Inject 19,400 Units into the skin once. (Patient not taking: Reported on 07/20/2014)  . [DISCONTINUED] pantoprazole (PROTONIX) 40 MG tablet Take 1 tablet (40 mg total) by mouth daily. (Patient not taking: Reported on 07/20/2014)     Orders Placed This Encounter  Procedures  . Basic metabolic panel    Return in about 1 week (around 07/27/2014).

## 2014-07-20 NOTE — Assessment & Plan Note (Signed)
LDL and triglycerides are at goal on current medications. She is alternating between lipitor and RYR daily to avoid leg cramps,  No changes today

## 2014-07-20 NOTE — Assessment & Plan Note (Signed)
Continue Ayr and antibacterial topical ointment.

## 2014-07-22 ENCOUNTER — Telehealth: Payer: Self-pay | Admitting: Internal Medicine

## 2014-07-22 NOTE — Telephone Encounter (Signed)
Patient cough up black liver consistency, Patient had nose cauterized about 4 weeks ago, for nose bleeds no discharge from nose.  Patient 180/110  HR 74, 10 minutes later 163/106 no heart rate recorded by patient,  About 2.5 hrs later BP 142/96 no heart rate recorded. Patient coughed black liver consistency one other time when patient was diagnosed with pneumonia.

## 2014-07-22 NOTE — Telephone Encounter (Signed)
Ok.  The bp is up due to anxiety but she has also been prescribed a new medication and needs to return in one week for BP check and BMEt   she needs to see ENT uf she is still coughing up black stuff becuae of her history of nosebleeds. She had no signs or symptoms of pneumonia when she saw me two days ago

## 2014-07-22 NOTE — Telephone Encounter (Signed)
Patient notified and voiced understanding.

## 2014-07-28 ENCOUNTER — Other Ambulatory Visit (INDEPENDENT_AMBULATORY_CARE_PROVIDER_SITE_OTHER): Payer: PPO

## 2014-07-28 ENCOUNTER — Ambulatory Visit: Payer: PPO | Admitting: *Deleted

## 2014-07-28 VITALS — BP 156/84 | HR 86

## 2014-07-28 DIAGNOSIS — I10 Essential (primary) hypertension: Secondary | ICD-10-CM

## 2014-07-28 LAB — BASIC METABOLIC PANEL
BUN: 15 mg/dL (ref 6–23)
CALCIUM: 9.6 mg/dL (ref 8.4–10.5)
CO2: 33 mEq/L — ABNORMAL HIGH (ref 19–32)
Chloride: 100 mEq/L (ref 96–112)
Creatinine, Ser: 0.98 mg/dL (ref 0.40–1.20)
GFR: 59.13 mL/min — ABNORMAL LOW (ref >60.00)
Glucose, Bld: 98 mg/dL (ref 70–99)
Potassium: 3.4 mEq/L — ABNORMAL LOW (ref 3.5–5.1)
Sodium: 138 mEq/L (ref 135–145)

## 2014-07-28 NOTE — Progress Notes (Addendum)
Pt presents for labs and BP check. Doing well without complaints. BP 156/84, HR 86. Confirmed taking Losartan 100 mg daily and HCTZ 25 mg daily. Advised we would contact with any changes once Dr. Derrel Nip has reviewed readings and labs,  verbalized understanding    I have reviewed the above information and agree with above.  Medication changes will be addressed.  Deborra Medina, MD

## 2014-07-29 ENCOUNTER — Other Ambulatory Visit: Payer: Self-pay | Admitting: Internal Medicine

## 2014-07-29 MED ORDER — TRIAMTERENE-HCTZ 37.5-25 MG PO TABS: 1.0000 | ORAL_TABLET | Freq: Every day | ORAL | Status: DC

## 2014-07-29 MED ORDER — METOPROLOL SUCCINATE ER 25 MG PO TB24: 25.0000 mg | ORAL_TABLET | Freq: Every day | ORAL | Status: DC

## 2014-07-30 ENCOUNTER — Telehealth: Payer: Self-pay | Admitting: Internal Medicine

## 2014-07-30 NOTE — Progress Notes (Unsigned)
Patient verbalized understanding of added medication and change, patient reported she has been taking 20 Meq of potassium given at hospital would you like for her to continue she has 30 day supply left ?

## 2014-07-30 NOTE — Telephone Encounter (Signed)
Yes since her level was slightly low, she can continue one tablet daily for a week,  Then stop . The new diuretic should take care of the low potassium

## 2014-07-30 NOTE — Telephone Encounter (Signed)
Patient notified and voiced understanding with repeat of directions.

## 2014-08-05 ENCOUNTER — Telehealth: Payer: Self-pay | Admitting: Internal Medicine

## 2014-08-05 ENCOUNTER — Other Ambulatory Visit: Payer: Self-pay | Admitting: Internal Medicine

## 2014-08-05 ENCOUNTER — Ambulatory Visit: Payer: PPO | Admitting: *Deleted

## 2014-08-05 ENCOUNTER — Other Ambulatory Visit (INDEPENDENT_AMBULATORY_CARE_PROVIDER_SITE_OTHER): Payer: PPO

## 2014-08-05 DIAGNOSIS — I10 Essential (primary) hypertension: Secondary | ICD-10-CM

## 2014-08-05 LAB — BASIC METABOLIC PANEL
BUN: 16 mg/dL (ref 6–23)
CHLORIDE: 101 meq/L (ref 96–112)
CO2: 30 meq/L (ref 19–32)
Calcium: 9.2 mg/dL (ref 8.4–10.5)
Creatinine, Ser: 0.92 mg/dL (ref 0.40–1.20)
GFR: 63.6 mL/min (ref >60.00)
Glucose, Bld: 99 mg/dL (ref 70–99)
POTASSIUM: 4.7 meq/L (ref 3.5–5.1)
Sodium: 135 mEq/L (ref 135–145)

## 2014-08-05 NOTE — Addendum Note (Signed)
Addended by: Johnsie Cancel on: 08/05/2014 10:41 AM   Modules accepted: Orders

## 2014-08-05 NOTE — Telephone Encounter (Signed)
BP is now at goal  Continue maxzide in am and losartan/metoprolol in PM

## 2014-08-05 NOTE — Telephone Encounter (Signed)
Patient presented for Blood pressure check per One week follow up after adding Maxzide ( patient reported taking at night was taking Metoprolol in the morning.) directions were to take each morning.with Losartan and Metoprolol in the evening. Patient gave list of BP since change  as follows,  3/17[ BP 148/87 Hr 74 AM ], [ 139/92 HR 90 PM], [ 143/83 Hr 86 bedtime], 07/31/14 [141/79 HR 61 AM], later in the AM same day [ 139/78 HR 61] 08/01/14 [ 137/81 HR 75 PM] , 08/02/14 [139/83 HR 70 AM], 08/03/14 [ 98/64 HR 82 AM], [ 97/58 HR 76 PM } patient held Maxzide one night and 08/04/14 [ 127/79 Hr 68 AM], [ 126/79 HR 73 PM] started Maxzide back at evening dose,08/05/14 (127/77 HR 69 AM]] :  Patient BP in office today for nurse visit was 122/66 heart rate of 67, advised patient that Maxzide was to be taken in the Am and MD orders stated losartan and Metoprolol in the PM. Patient voiced understanding and wrote directions for patient.

## 2014-08-05 NOTE — Progress Notes (Signed)
Patient presented for Blood pressure check per One week follow up after adding Maxzide ( patient reported taking at night was taking Metoprolol in the morning.) directions were to take each morning.with Losartan and Metoprolol in the evening. Patient gave list of BP since change  as follows,  3/17[ BP 148/87 Hr 74 AM ], [ 139/92 HR 90 PM], [ 143/83 Hr 86 bedtime], 07/31/14 [141/79 HR 61 AM], later in the AM same day [ 139/78 HR 61] 08/01/14 [ 137/81 HR 75 PM] , 08/02/14 [139/83 HR 70 AM], 08/03/14 [ 98/64 HR 82 AM], [ 97/58 HR 76 PM } patient held Maxzide one night and 08/04/14 [ 127/79 Hr 68 AM], [ 126/79 HR 73 PM] started Maxzide back at evening dose,08/05/14 (127/77 HR 69 AM]] :  Patient BP in office today for nurse visit was 122/66 heart rate of 67, advised patient that Maxzide was to be taken in the Am and MD orders stated losartan and Metoprolol in the PM. Patient voiced understanding and wrote directions for patient.

## 2014-08-06 NOTE — Telephone Encounter (Signed)
Patient notified and voiced understanding.

## 2014-12-23 ENCOUNTER — Encounter (INDEPENDENT_AMBULATORY_CARE_PROVIDER_SITE_OTHER): Payer: Self-pay

## 2014-12-23 ENCOUNTER — Encounter: Payer: Self-pay | Admitting: Internal Medicine

## 2014-12-23 ENCOUNTER — Ambulatory Visit (INDEPENDENT_AMBULATORY_CARE_PROVIDER_SITE_OTHER): Payer: PPO | Admitting: Internal Medicine

## 2014-12-23 VITALS — BP 128/80 | HR 64 | Temp 97.9°F | Resp 14 | Ht 63.0 in | Wt 148.0 lb

## 2014-12-23 DIAGNOSIS — Z716 Tobacco abuse counseling: Secondary | ICD-10-CM

## 2014-12-23 DIAGNOSIS — G4700 Insomnia, unspecified: Secondary | ICD-10-CM

## 2014-12-23 DIAGNOSIS — M810 Age-related osteoporosis without current pathological fracture: Secondary | ICD-10-CM

## 2014-12-23 DIAGNOSIS — E785 Hyperlipidemia, unspecified: Secondary | ICD-10-CM | POA: Diagnosis not present

## 2014-12-23 DIAGNOSIS — E876 Hypokalemia: Secondary | ICD-10-CM | POA: Diagnosis not present

## 2014-12-23 DIAGNOSIS — I10 Essential (primary) hypertension: Secondary | ICD-10-CM

## 2014-12-23 DIAGNOSIS — J479 Bronchiectasis, uncomplicated: Secondary | ICD-10-CM

## 2014-12-23 LAB — COMPREHENSIVE METABOLIC PANEL
ALBUMIN: 4.3 g/dL (ref 3.5–5.2)
ALT: 17 U/L (ref 0–35)
AST: 19 U/L (ref 0–37)
Alkaline Phosphatase: 72 U/L (ref 39–117)
BILIRUBIN TOTAL: 0.7 mg/dL (ref 0.2–1.2)
BUN: 16 mg/dL (ref 6–23)
CO2: 30 mEq/L (ref 19–32)
CREATININE: 1.05 mg/dL (ref 0.40–1.20)
Calcium: 9.6 mg/dL (ref 8.4–10.5)
Chloride: 102 mEq/L (ref 96–112)
GFR: 54.54 mL/min — ABNORMAL LOW (ref >60.00)
GLUCOSE: 93 mg/dL (ref 70–99)
Potassium: 4.1 mEq/L (ref 3.5–5.1)
Sodium: 140 mEq/L (ref 135–145)
Total Protein: 7.9 g/dL (ref 6.0–8.3)

## 2014-12-23 LAB — LIPID PANEL
Cholesterol: 167 mg/dL (ref 0–200)
HDL: 34 mg/dL — AB (ref >39.00)
LDL Cholesterol: 100 mg/dL — ABNORMAL HIGH (ref 0–99)
NonHDL: 132.82
Total CHOL/HDL Ratio: 5
Triglycerides: 166 mg/dL — ABNORMAL HIGH (ref 0.0–149.0)
VLDL: 33.2 mg/dL (ref 0.0–40.0)

## 2014-12-23 MED ORDER — DOCUSATE CALCIUM 240 MG PO CAPS: 100.0000 mg | ORAL_CAPSULE | Freq: Every day | ORAL | Status: DC

## 2014-12-23 MED ORDER — TRIAMTERENE-HCTZ 37.5-25 MG PO TABS
0.5000 | ORAL_TABLET | Freq: Every day | ORAL | Status: DC
Start: 2014-12-23 — End: 2017-04-16

## 2014-12-23 NOTE — Progress Notes (Signed)
Subjective:  Patient ID: Whitney Molina, female    DOB: 02-27-1941  Age: 74 y.o. MRN: [id number]  CC: The primary encounter diagnosis was Hyperlipidemia. Diagnoses of Hypokalemia, Tobacco abuse counseling, Insomnia, Hyperlipidemia LDL goal <100, Essential hypertension, Bronchiectasis without complication, and Osteoporosis were also pertinent to this visit.  HPI [name] presents for  Follow up on hypertension and bronchiectasis Her chief complaint today is fatigue and chronic  insomnia.   Doesn't fall asleepfor over an hour.   Has a daily nap anywhere from 30 min to 2 hours.  Then lies awake  until 11 pm.  Watches TV after dinner or gardens, no computer work,  No caffeine after lunch;  coffee is decaf. One void per night.  No use of blue lit screens. Has not tried melatonin   Bronchiectasis:  Has had the cough improved. Has reduced symbicort to once weekly for bronchiectasis,  Has not seen mcquaid  Getting allergy shots , has helped a lot .    Otitis externa right ear.  using cream pn [facility]   HTN: she has reduce her dose of maxzide to half of maxzide in the am bc it made her too tired.Has not been checking her BPs       Outpatient Prescriptions Prior to Visit  Medication Sig Dispense Refill  . atorvastatin (LIPITOR) 20 MG tablet Take 2 tablets (40 mg total) by mouth at bedtime. 180 tablet 1  . budesonide-formoterol (SYMBICORT) 160-4.5 MCG/ACT inhaler Inhale 2 puffs into the lungs 2 (two) times daily. 1 Inhaler 6  . escitalopram (LEXAPRO) 10 MG tablet Take 1 tablet (10 mg total) by mouth daily. 90 tablet 1  . hyoscyamine (LEVBID) 0.375 MG 12 hr tablet Take 1 tablet (0.375 mg total) by mouth daily. 90 tablet 1  . losartan (COZAAR) 100 MG tablet Take 1 tablet (100 mg total) by mouth daily. 90 tablet 3  . metoprolol succinate (TOPROL-XL) 25 MG 24 hr tablet Take 1 tablet (25 mg total) by mouth daily. 90 tablet 3  . Spacer/Aero-Holding Chambers (AEROCHAMBER MV) inhaler  Use as instructed 1 each 0  . zoster vaccine live, PF, (ZOSTAVAX) 85631 UNT/0.65ML injection Inject 19,400 Units into the skin once. 1 each 0  . docusate calcium (SURFAK) 240 MG capsule Take 1 capsule (240 mg total) by mouth daily. (Patient taking differently: Take 100 mg by mouth daily. ) 30 capsule 5  . hydrochlorothiazide (HYDRODIURIL) 25 MG tablet Take 1 tablet (25 mg total) by mouth daily. 90 tablet 3  . triamterene-hydrochlorothiazide (MAXZIDE-25) 37.5-25 MG per tablet Take 1 tablet by mouth daily. 90 tablet 3   No facility-administered medications prior to visit.    Review of Systems;  Patient denies headache, fevers, malaise, unintentional weight loss, skin rash, eye pain, sinus congestion and sinus pain, sore throat, dysphagia,  hemoptysis , cough, dyspnea, wheezing, chest pain, palpitations, orthopnea, edema, abdominal pain, nausea, melena, diarrhea, constipation, flank pain, dysuria, hematuria, urinary  Frequency, nocturia, numbness, tingling, seizures,  Focal weakness, Loss of consciousness,  Tremor, insomnia, depression, anxiety, and suicidal ideation.      Objective:  BP 128/80 mmHg  Pulse 64  Temp(Src) 97.9 F (36.6 C) (Oral)  Resp 14  Ht 5\' 3"  (1.6 m)  Wt 148 lb (67.132 kg)  BMI 26.22 kg/m2  SpO2 97%  BP Readings from Last 3 Encounters:  12/23/14 128/80  07/28/14 156/84  07/20/14 160/92    Wt Readings from Last 3 Encounters:  12/23/14 148 lb (67.132 kg)  07/20/14  141 lb 8 oz (64.184 kg)  06/24/14 145 lb (65.772 kg)    General appearance: alert, cooperative and appears stated age Ears: normal TM's and external ear erythematous without discharge bilaterally  Throat: lips, mucosa, and tongue normal; teeth and gums normal Neck: no adenopathy, no carotid bruit, supple, symmetrical, trachea midline and thyroid not enlarged, symmetric, no tenderness/mass/nodules Back: symmetric, no curvature. ROM normal. No CVA tenderness. Lungs: clear to auscultation  bilaterally Heart: regular rate and rhythm, S1, S2 normal, no murmur, click, rub or gallop Abdomen: soft, non-tender; bowel sounds normal; no masses,  no organomegaly Pulses: 2+ and symmetric Skin: Skin color, texture, turgor normal. No rashes or lesions Lymph nodes: Cervical, supraclavicular, and axillary nodes normal.  No results found for: HGBA1C  Lab Results  Component Value Date   CREATININE 1.05 12/23/2014   CREATININE 0.92 08/05/2014   CREATININE 0.98 07/28/2014    Lab Results  Component Value Date   WBC 6.9 09/10/2013   HGB 13.1 09/10/2013   HCT 39.5 09/10/2013   PLT 233 09/10/2013   GLUCOSE 93 12/23/2014   CHOL 167 12/23/2014   TRIG 166.0* 12/23/2014   HDL 34.00* 12/23/2014   LDLCALC 100* 12/23/2014   ALT 17 12/23/2014   AST 19 12/23/2014   NA 140 12/23/2014   K 4.1 12/23/2014   CL 102 12/23/2014   CREATININE 1.05 12/23/2014   BUN 16 12/23/2014   CO2 30 12/23/2014    No results found.  Assessment & Plan:   Problem List Items Addressed This Visit      Medium   Hyperlipidemia LDL goal <100    LDL and triglycerides are at goal on current medications. She is alternating between lipitor and RYR daily to avoid leg cramps,  No changes today   Lab Results  Component Value Date   CHOL 167 12/23/2014   HDL 34.00* 12/23/2014   LDLCALC 100* 12/23/2014   TRIG 166.0* 12/23/2014   CHOLHDL 5 12/23/2014         Relevant Medications   triamterene-hydrochlorothiazide (MAXZIDE-25) 37.5-25 MG per tablet     Unprioritized   Bronchiectasis     Has reduced use of symbicort to once weekly with no  Recurrence of symptoms.       Osteoporosis    T scores -2.4 Sept 2015.  No history of fractures.  No hhistoryof prior treatment. Will recommend starting alendronate.      Essential hypertension    Well controlled on current regimen of 1/2 maxzide tablet and topril 25 mg . Renal function stable, no changes today.  Lab Results  Component Value Date   NA 140  12/23/2014   K 4.1 12/23/2014   CL 102 12/23/2014   CO2 30 12/23/2014   Lab Results  Component Value Date   CREATININE 1.05 12/23/2014         Relevant Medications   triamterene-hydrochlorothiazide (MAXZIDE-25) 37.5-25 MG per tablet   Insomnia    Chronic, with no trial of using over-the-counter first generation antihistamines. Reviewed principles of good sleep hygiene. Although she is a snorer, there is no report of apneic spells by husband.  Recommended trial of melatonin and limiting daytime naps to 30 mintues.       RESOLVED: Tobacco abuse counseling    Other Visit Diagnoses    Hyperlipidemia    -  Primary    Relevant Medications    triamterene-hydrochlorothiazide (MAXZIDE-25) 37.5-25 MG per tablet    Other Relevant Orders    Lipid panel (Completed)  Hypokalemia        Relevant Orders    Comprehensive metabolic panel (Completed)       I have discontinued Ms. Ferber's hydrochlorothiazide. I have also changed her triamterene-hydrochlorothiazide. Additionally, I am having her maintain her AEROCHAMBER MV, zoster vaccine live (PF), budesonide-formoterol, escitalopram, hyoscyamine, losartan, atorvastatin, metoprolol succinate, and docusate calcium.  Meds ordered this encounter  Medications  . triamterene-hydrochlorothiazide (MAXZIDE-25) 37.5-25 MG per tablet    Sig: Take 0.5 tablets by mouth daily.    Dispense:  90 tablet    Refill:  3  . docusate calcium (SURFAK) 240 MG capsule    Sig: Take 1 capsule (240 mg total) by mouth daily.    Dispense:  30 capsule    Refill:  5    Medications Discontinued During This Encounter  Medication Reason  . hydrochlorothiazide (HYDRODIURIL) 25 MG tablet   . triamterene-hydrochlorothiazide (MAXZIDE-25) 37.5-25 MG per tablet Reorder  . docusate calcium (SURFAK) 240 MG capsule Reorder   A total of 40 minutes of face to face time was spent with patient more than half of which was spent in counselling and coordination of care   Follow-up: Return in about 3 months (around 03/25/2015).   [name], MD

## 2014-12-23 NOTE — Progress Notes (Signed)
Pre-visit discussion using our clinic review tool. No additional management support is needed unless otherwise documented below in the visit note.  

## 2014-12-23 NOTE — Patient Instructions (Addendum)
I recommend trying melatonin  3 to 5 mg one hour before bedtime ) or take after dinner) for your insomnia   try to limit your daytime naps to 30 minutes or less  Return i n 3 months  For your annual wellness exam

## 2014-12-25 DIAGNOSIS — G4700 Insomnia, unspecified: Secondary | ICD-10-CM | POA: Insufficient documentation

## 2014-12-25 NOTE — Assessment & Plan Note (Signed)
Chronic, with no trial of using over-the-counter first generation antihistamines. Reviewed principles of good sleep hygiene. Although she is a snorer, there is no report of apneic spells by husband.  Recommended trial of melatonin and limiting daytime naps to 30 mintues.

## 2014-12-25 NOTE — Assessment & Plan Note (Signed)
Well controlled on current regimen of 1/2 maxzide tablet and topril 25 mg . Renal function stable, no changes today.  Lab Results  Component Value Date   NA 140 12/23/2014   K 4.1 12/23/2014   CL 102 12/23/2014   CO2 30 12/23/2014   Lab Results  Component Value Date   CREATININE 1.05 12/23/2014

## 2014-12-25 NOTE — Assessment & Plan Note (Signed)
LDL and triglycerides are at goal on current medications. She is alternating between lipitor and RYR daily to avoid leg cramps,  No changes today   Lab Results  Component Value Date   CHOL 167 12/23/2014   HDL 34.00* 12/23/2014   LDLCALC 100* 12/23/2014   TRIG 166.0* 12/23/2014   CHOLHDL 5 12/23/2014

## 2014-12-25 NOTE — Assessment & Plan Note (Signed)
Has reduced use of symbicort to once weekly with no  Recurrence of symptoms.

## 2014-12-25 NOTE — Assessment & Plan Note (Addendum)
T scores -2.4 Sept 2015.  No history of fractures.  No hhistoryof prior treatment. Will recommend starting alendronate.

## 2014-12-28 ENCOUNTER — Encounter: Payer: Self-pay | Admitting: *Deleted

## 2015-01-25 ENCOUNTER — Other Ambulatory Visit: Payer: Self-pay | Admitting: Internal Medicine

## 2015-02-14 ENCOUNTER — Emergency Department
Admission: EM | Admit: 2015-02-14 | Discharge: 2015-02-14 | Disposition: A | Discharge status: Home / Self Care | Payer: PPO | Attending: Emergency Medicine | Admitting: Emergency Medicine

## 2015-02-14 ENCOUNTER — Emergency Department: Payer: PPO

## 2015-02-14 ENCOUNTER — Encounter: Payer: Self-pay | Admitting: Emergency Medicine

## 2015-02-14 DIAGNOSIS — Z79899 Other long term (current) drug therapy: Secondary | ICD-10-CM | POA: Diagnosis not present

## 2015-02-14 DIAGNOSIS — Z7951 Long term (current) use of inhaled steroids: Secondary | ICD-10-CM | POA: Insufficient documentation

## 2015-02-14 DIAGNOSIS — R109 Unspecified abdominal pain: Secondary | ICD-10-CM | POA: Diagnosis not present

## 2015-02-14 DIAGNOSIS — I10 Essential (primary) hypertension: Secondary | ICD-10-CM | POA: Insufficient documentation

## 2015-02-14 LAB — CBC
HCT: 37.6 % (ref 35.0–47.0)
Hemoglobin: 12.7 g/dL (ref 12.0–16.0)
MCH: 32.1 pg (ref 26.0–34.0)
MCHC: 33.8 g/dL (ref 32.0–36.0)
MCV: 95.1 fL (ref 80.0–100.0)
PLATELETS: 207 10*3/uL (ref 150–440)
RBC: 3.95 MIL/uL (ref 3.80–5.20)
RDW: 13.5 % (ref 11.5–14.5)
WBC: 8 10*3/uL (ref 3.6–11.0)

## 2015-02-14 LAB — URINALYSIS COMPLETE WITH MICROSCOPIC (ARMC ONLY)
BILIRUBIN URINE: NEGATIVE
Bacteria, UA: NONE SEEN
GLUCOSE, UA: NEGATIVE mg/dL
KETONES UR: NEGATIVE mg/dL
NITRITE: NEGATIVE
PROTEIN: NEGATIVE mg/dL
Specific Gravity, Urine: 1.01 (ref 1.005–1.030)
pH: 5 (ref 5.0–8.0)

## 2015-02-14 LAB — BASIC METABOLIC PANEL
Anion gap: 6 (ref 5–15)
BUN: 12 mg/dL (ref 6–20)
CALCIUM: 9.1 mg/dL (ref 8.9–10.3)
CO2: 30 mmol/L (ref 22–32)
CREATININE: 1.11 mg/dL — AB (ref 0.44–1.00)
Chloride: 99 mmol/L — ABNORMAL LOW (ref 101–111)
GFR, EST AFRICAN AMERICAN: 56 mL/min — AB (ref >60)
GFR, EST NON AFRICAN AMERICAN: 48 mL/min — AB (ref >60)
GLUCOSE: 99 mg/dL (ref 65–99)
Potassium: 3.3 mmol/L — ABNORMAL LOW (ref 3.5–5.1)
Sodium: 135 mmol/L (ref 135–145)

## 2015-02-14 LAB — HEPATIC FUNCTION PANEL
ALK PHOS: 63 U/L (ref 38–126)
ALT: 18 U/L (ref 14–54)
AST: 20 U/L (ref 15–41)
Albumin: 4 g/dL (ref 3.5–5.0)
BILIRUBIN TOTAL: 0.8 mg/dL (ref 0.3–1.2)
Total Protein: 7.5 g/dL (ref 6.5–8.1)

## 2015-02-14 MED ORDER — TAPENTADOL HCL 50 MG PO TABS
50.0000 mg | ORAL_TABLET | Freq: Once | ORAL | Status: AC
  Administered 2015-02-14: 50 mg via ORAL

## 2015-02-14 MED ORDER — TRAMADOL HCL 50 MG PO TABS
50.0000 mg | ORAL_TABLET | Freq: Once | ORAL | Status: AC
  Administered 2015-02-14: 50 mg via ORAL
  Filled 2015-02-14: qty 1

## 2015-02-14 MED ORDER — TAPENTADOL HCL 50 MG PO TABS: 50.0000 mg | ORAL_TABLET | Freq: Four times a day (QID) | ORAL | Status: DC | PRN

## 2015-02-14 NOTE — ED Provider Notes (Signed)
[facility] Emergency Department Provider Note  Time seen: 11:26 AM  I have reviewed the triage vital signs and the nursing notes.   HISTORY  Chief Complaint Abdominal Pain and Flank Pain    HPI Whitney Molina is a 74 y.o. female with a past medical history of hypertension, hyperlipidemia, kidney stones, presents the emergency department with left flank pain. According to the patient for the past 5-6 days she has had left flank pain.States a history of kidney stones with the last one approximately 1-2 years ago. States the pain feels more like a burning sensation, almost like a numbness to the skin per patient. There is also been itching. Denies dysuria or hematuria. Denies fever, nausea, vomiting, diarrhea. Describes the pain as moderate to severe, currently an 8/10.     Past Medical History  Diagnosis Date  . Bronchitis   . Hypertension   . Hyperlipidemia   . Cancer ([place])     skin  . Hemorrhoids   . Heart murmur   . GERD (gastroesophageal reflux disease)   . History of kidney stones   . History of colon polyps     Patient Active Problem List   Diagnosis Date Noted  . Insomnia 12/25/2014  . Epistaxis 07/20/2014  . Hyperlipidemia LDL goal <100 06/26/2014  . Postmenopausal atrophic vaginitis 06/26/2014  . Essential hypertension 06/24/2014  . Osteoporosis 02/05/2014  . Bronchiectasis  09/15/2013  . History of colon polyps 10/24/2012    Past Surgical History  Procedure Laterality Date  . Tonsillectomy  1971  . Tubal ligation  1975  . Abdominal hysterectomy  1984  . Vaginal prolapse repair  July 2001    Pelvic Prolapse  . Cholecystectomy  06-01-03  . Lithotripsy  2005  . Bladder suspension  2010    Current Outpatient Rx  Name  Route  Sig  Dispense  Refill  . atorvastatin (LIPITOR) 20 MG tablet   Oral   Take 2 tablets (40 mg total) by mouth at bedtime.   180 tablet   1   . budesonide-formoterol (SYMBICORT) 160-4.5 MCG/ACT  inhaler   Inhalation   Inhale 2 puffs into the lungs 2 (two) times daily.   1 Inhaler   6   . docusate calcium (SURFAK) 240 MG capsule   Oral   Take 1 capsule (240 mg total) by mouth daily.   30 capsule   5   . escitalopram (LEXAPRO) 10 MG tablet      TAKE ONE (1) TABLET BY MOUTH EVERY DAY   90 tablet   4   . hyoscyamine (LEVBID) 0.375 MG 12 hr tablet   Oral   Take 1 tablet (0.375 mg total) by mouth daily.   90 tablet   1   . losartan (COZAAR) 100 MG tablet   Oral   Take 1 tablet (100 mg total) by mouth daily.   90 tablet   3   . metoprolol succinate (TOPROL-XL) 25 MG 24 hr tablet   Oral   Take 1 tablet (25 mg total) by mouth daily.   90 tablet   3   . Spacer/Aero-Holding Chambers (AEROCHAMBER MV) inhaler      Use as instructed   1 each   0   . triamterene-hydrochlorothiazide (MAXZIDE-25) 37.5-25 MG per tablet   Oral   Take 0.5 tablets by mouth daily.   90 tablet   3   . zoster vaccine live, PF, (ZOSTAVAX) 94854 UNT/0.65ML injection   Subcutaneous   Inject  19,400 Units into the skin once.   1 each   0     Allergies Review of patient's allergies indicates no known allergies.  Family History  Problem Relation Age of Onset  . Cancer Mother     breast   . Stroke Mother   . Stroke Father   . Cancer Brother     bladder  . Stroke Brother     Social History Social History  Substance Use Topics  . Smoking status: Never Smoker   . Smokeless tobacco: Never Used  . Alcohol Use: No    Review of Systems Constitutional: Negative for fever. Cardiovascular: Negative for chest pain. Respiratory: Negative for shortness of breath. Gastrointestinal: Positive for left flank pain. Genitourinary: Negative for dysuria. Hematuria. Musculoskeletal: Negative for back pain. Neurological: Negative for headache 10-point ROS otherwise negative.  ____________________________________________   PHYSICAL EXAM:  VITAL SIGNS: ED Triage Vitals  Enc Vitals Group      BP 02/14/15 0725 140/78 mmHg     Pulse Rate 02/14/15 0725 57     Resp 02/14/15 0725 18     Temp 02/14/15 0725 98.4 F (36.9 C)     Temp Source 02/14/15 0725 Oral     SpO2 02/14/15 0725 99 %     Weight 02/14/15 0725 145 lb (65.772 kg)     Height 02/14/15 0725 5\' 4"  (1.626 m)     Head Cir --      Peak Flow --      Pain Score 02/14/15 0725 6     Pain Loc --      Pain Edu? --      Excl. in [place]? --     Constitutional: Alert and oriented. Well appearing and in no distress. Eyes: Normal exam ENT   Head: Normocephalic and atraumatic.   Mouth/Throat: Mucous membranes are moist. Cardiovascular: Normal rate, regular rhythm. No murmur Respiratory: Normal respiratory effort without tachypnea nor retractions. Breath sounds are clear and equal bilaterally. No wheezes/rales/rhonchi. Gastrointestinal: Soft and nontender. No distention.  There is no CVA tenderness. Musculoskeletal: Nontender with normal range of motion in all extremities Neurologic:  Normal speech and language. No gross focal neurologic deficits are appreciated. Speech is normal. Skin:  Skin is warm, dry and intact. No rash noted. Psychiatric: Mood and affect are normal.  ____________________________________________   RADIOLOGY  CT shows no acute abnormality, but enlarged lymph nodes.  ____________________________________________   INITIAL IMPRESSION / ASSESSMENT AND PLAN / ED COURSE  Pertinent labs & imaging results that were available during my care of the patient were reviewed by me and considered in my medical decision making (see chart for details).  Patient presents with left flank pain for the last 5-6 days. She describes more of a superficial pain. There is no rash present. Patient has a history of kidney stones but denies dysuria or hematuria. Her labs are largely within normal limits. I will add on a urine culture given equivocal urinalysis results. Her symptoms sound most suggestive of possible  shingles, but no rashes present at this time. We will treat the patient's discomfort, she states she is taking Nucynta in the past with good response other narcotics have made her nauseated. We will check a CT renal scan to rule out ureterolithiasis.  CT shows no acute abnormalities. No ureterolithiasis. It does show enlarged lymph nodes around the aorta. I discussed this finding with the patient will follow-up with Dr. Loni Muse of oncology to arrange a PET scan. She will also follow up  with her primary care physician. I printed a report of the CT scan for her to take to her primary care physician. We will treat the patient's discomfort until she can follow up. Given the area of discomfort, burning/itching pain, it does appear consistent with shingles, however there is no rash present at this time. Up with a primary care doctor. ____________________________________________   FINAL CLINICAL IMPRESSION(S) / ED DIAGNOSES  Left flank pain   Harvest Dark, MD 02/14/15 1353

## 2015-02-14 NOTE — Discharge Instructions (Signed)
Please take your pain medication as needed, as prescribed. Please follow up with her primary care physician as soon as possible regarding her left flank pain. Please follow-up with Dr. Loni Muse by calling the number provided to arrange for a PET scan given urine large abdominal lymph nodes.   Flank Pain Flank pain refers to pain that is located on the side of the body between the upper abdomen and the back. The pain may occur over a short period of time (acute) or may be long-term or reoccurring (chronic). It may be mild or severe. Flank pain can be caused by many things. CAUSES  Some of the more common causes of flank pain include:  Muscle strains.   Muscle spasms.   A disease of your spine (vertebral disk disease).   A lung infection (pneumonia).   Fluid around your lungs (pulmonary edema).   A kidney infection.   Kidney stones.   A very painful skin rash caused by the chickenpox virus (shingles).   Gallbladder disease.  [place] care will depend on the cause of your pain. In general,  Rest as directed by your caregiver.  Drink enough fluids to keep your urine clear or pale yellow.  Only take over-the-counter or prescription medicines as directed by your caregiver. Some medicines may help relieve the pain.  Tell your caregiver about any changes in your pain.  Follow up with your caregiver as directed. SEEK IMMEDIATE MEDICAL CARE IF:   Your pain is not controlled with medicine.   You have new or worsening symptoms.  Your pain increases.   You have abdominal pain.   You have shortness of breath.   You have persistent nausea or vomiting.   You have swelling in your abdomen.   You feel faint or pass out.   You have blood in your urine.  You have a fever or persistent symptoms for more than 2-3 days.  You have a fever and your symptoms suddenly get worse. MAKE SURE YOU:   Understand these instructions.  Will watch your  condition.  Will get help right away if you are not doing well or get worse. Document Released: 06/22/2005 Document Revised: 01/24/2012 Document Reviewed: 12/14/2011 [facility] Patient Information 2015 [place], [place]. This information is not intended to replace advice given to you by your health care provider. Make sure you discuss any questions you have with your health care provider.

## 2015-02-14 NOTE — ED Notes (Signed)
Discharge instructions, follow-up care, and medications reviewed with patient. No questions or concerns at this time. Pt stable at discharge.

## 2015-02-14 NOTE — ED Notes (Signed)
Pt states she has a "hot/burning" pain in her LLQ and flank that began on Tu. Denies burning with urination, does have a hx of kidney stones.

## 2015-02-14 NOTE — ED Notes (Signed)
Pt appears in no pain at this time, states a warm bath usually helps her kidney stone pain, this time, however, it has provided no relief.

## 2015-02-15 ENCOUNTER — Ambulatory Visit (INDEPENDENT_AMBULATORY_CARE_PROVIDER_SITE_OTHER): Payer: PPO | Admitting: Internal Medicine

## 2015-02-15 ENCOUNTER — Encounter: Payer: Self-pay | Admitting: Internal Medicine

## 2015-02-15 VITALS — BP 122/76 | HR 89 | Temp 98.5°F | Resp 14 | Ht 63.0 in | Wt 150.0 lb

## 2015-02-15 DIAGNOSIS — E876 Hypokalemia: Secondary | ICD-10-CM | POA: Diagnosis not present

## 2015-02-15 DIAGNOSIS — R1032 Left lower quadrant pain: Secondary | ICD-10-CM | POA: Diagnosis not present

## 2015-02-15 DIAGNOSIS — R1012 Left upper quadrant pain: Secondary | ICD-10-CM

## 2015-02-15 DIAGNOSIS — R109 Unspecified abdominal pain: Secondary | ICD-10-CM

## 2015-02-15 MED ORDER — LACTULOSE 20 GM/30ML PO SOLN: ORAL | Status: DC

## 2015-02-15 MED ORDER — POTASSIUM CHLORIDE CRYS ER 20 MEQ PO TBCR: 20.0000 meq | EXTENDED_RELEASE_TABLET | Freq: Every day | ORAL | Status: DC

## 2015-02-15 NOTE — Patient Instructions (Addendum)
Please drink lots of gatorade tonight , but keep yorurdiet simple (mashed potatoes, no salads, no grease)  I will call in lactulose to help you move your  Bowels, and potassium to replace what you lost  CT scan with IV and oral contrast to be set up to rule out appendicitis and diverticulitis

## 2015-02-15 NOTE — Progress Notes (Signed)
Subjective:    Patient ID: Whitney Molina, female    DOB: 20-May-1940  Age: 74 y.o. MRN: [id number]  CC: The primary encounter diagnosis was Hypokalemia. Diagnoses of Acute left flank pain and Abdominal pain, left lower quadrant were also pertinent to this visit.  ER follow up for retroperitoneal and peraortic lymphadenopathy in the setting of persistent left lower back pain radiating to left flank.    History: Left side started hurting a week ago.  .  Thought it was due to  a kidney a stone so she tried treating it with increased  Hydration. Lots of gatorade daily.Lost a lot of sleep every night due to increased pain    .   Finally went to Er on Sunday for persistent pain . Labs reviewedL   CBC normal,    UA 6-30 WBCS, NO RBCS .  BUT CT SCAN was done  WITHOUT CONTRAST due to   history of stones.  No stones were seen but r/p and  perioaortic lymphadenopathy was noted. ED phyisicna tolder her she probably had early shingles and recommended oncology follow up with PET scan due to "history of cancer " (non melanoma skin cancer per patient) .  Was not given acyclovir. No rash has occurred in over one week of pain.   Addititonal history :  Symptoms of left back pain radiating to flank were preceded by  a 2 week period of loose watery stools, multiple,  3-5 times every morning upon rising,  before breakfast , none throughout  the day as long as she didn't eat much.  Denies nausea, did have some  mild cramping,  No blood im stools or urine.  No fevers during that time. Mar[place] Kitchen  Resolved spontaneously over a week ago, followed by constipation and flank pain.   No travel,  No farm animals .    Left flank too tender to touch, and numb feeling on the affected side.  The  back pain is aggravated by bending over to garden and vacuum.     She has Diverticulosis  On CT.   Outpatient Prescriptions Prior to Visit  Medication Sig Dispense Refill  . atorvastatin (LIPITOR) 20 MG tablet Take 2 tablets (40 mg total) by  mouth at bedtime. 180 tablet 1  . budesonide-formoterol (SYMBICORT) 160-4.5 MCG/ACT inhaler Inhale 2 puffs into the lungs 2 (two) times daily. 1 Inhaler 6  . docusate calcium (SURFAK) 240 MG capsule Take 1 capsule (240 mg total) by mouth daily. 30 capsule 5  . escitalopram (LEXAPRO) 10 MG tablet TAKE ONE (1) TABLET BY MOUTH EVERY DAY 90 tablet 4  . hyoscyamine (LEVBID) 0.375 MG 12 hr tablet Take 1 tablet (0.375 mg total) by mouth daily. 90 tablet 1  . losartan (COZAAR) 100 MG tablet Take 1 tablet (100 mg total) by mouth daily. 90 tablet 3  . metoprolol succinate (TOPROL-XL) 25 MG 24 hr tablet Take 1 tablet (25 mg total) by mouth daily. 90 tablet 3  . Spacer/Aero-Holding Chambers (AEROCHAMBER MV) inhaler Use as instructed 1 each 0  . tapentadol (NUCYNTA) 50 MG TABS tablet Take 1 tablet (50 mg total) by mouth every 6 (six) hours as needed for moderate pain. 20 tablet 0  . triamterene-hydrochlorothiazide (MAXZIDE-25) 37.5-25 MG per tablet Take 0.5 tablets by mouth daily. 90 tablet 3  . zoster vaccine live, PF, (ZOSTAVAX) 82800 UNT/0.65ML injection Inject 19,400 Units into the skin once. 1 each 0   No facility-administered medications prior to visit.    Review  of Systems;  Patient denies headache, fevers, malaise, unintentional weight loss, skin rash, eye pain, sinus congestion and sinus pain, sore throat, dysphagia,  hemoptysis , cough, dyspnea, wheezing, chest pain, palpitations, orthopnea, edema, abdominal pain, nausea, melena, diarrhea, constipation, flank pain, dysuria, hematuria, urinary  Frequency, nocturia, numbness, tingling, seizures,  Focal weakness, Loss of consciousness,  Tremor, insomnia, depression, anxiety, and suicidal ideation.      Objective:  BP 122/76 mmHg  Pulse 89  Temp(Src) 98.5 F (36.9 C) (Oral)  Resp 14  Ht 5\' 3"  (1.6 m)  Wt 150 lb (68.04 kg)  BMI 26.58 kg/m2  SpO2 97%  BP Readings from Last 3 Encounters:  02/15/15 122/76  02/14/15 165/88  12/23/14 128/80     Wt Readings from Last 3 Encounters:  02/15/15 150 lb (68.04 kg)  02/14/15 145 lb (65.772 kg)  12/23/14 148 lb (67.132 kg)    General appearance: alert, cooperative and appears stated age Ears: normal TM's and external ear canals both ears Throat: lips, mucosa, and tongue normal; teeth and gums normal Neck: no adenopathy, no carotid bruit, supple, symmetrical, trachea midline and thyroid not enlarged, symmetric, no tenderness/mass/nodules Back: symmetric, no curvature. ROM normal. No CVA tenderness. Lungs: clear to auscultation bilaterally Heart: regular rate and rhythm, S1, S2 normal, no murmur, click, rub or gallop Abdomen: soft,  Tender in LLQ , bowel sounds quiet ; no masses,  no organomegaly Pulses: 2+ and symmetric Skin: Skin color, texture, turgor normal. No rashes or lesions Lymph nodes: Cervical, supraclavicular, and axillary nodes normal.  No results found for: HGBA1C  Lab Results  Component Value Date   CREATININE 1.11* 02/14/2015   CREATININE 1.05 12/23/2014   CREATININE 0.92 08/05/2014    Lab Results  Component Value Date   WBC 8.0 02/14/2015   HGB 12.7 02/14/2015   HCT 37.6 02/14/2015   PLT 207 02/14/2015   GLUCOSE 99 02/14/2015   CHOL 167 12/23/2014   TRIG 166.0* 12/23/2014   HDL 34.00* 12/23/2014   LDLCALC 100* 12/23/2014   ALT 18 02/14/2015   AST 20 02/14/2015   NA 135 02/14/2015   K 3.3* 02/14/2015   CL 99* 02/14/2015   CREATININE 1.11* 02/14/2015   BUN 12 02/14/2015   CO2 30 02/14/2015    Ct Renal Stone Study  02/14/2015   CLINICAL DATA:  Left lower quadrant and left flank pain beginning on Tuesday. History kidney stones.  EXAM: CT ABDOMEN AND PELVIS WITHOUT CONTRAST  TECHNIQUE: Multidetector CT imaging of the abdomen and pelvis was performed following the standard protocol without IV contrast.  COMPARISON:  03/30/2013  FINDINGS: Lower chest: Clear lung bases. Mild cardiomegaly, without pericardial or pleural effusion.  Hepatobiliary: Normal  liver. Cholecystectomy, without biliary ductal dilatation.  Pancreas: Pancreatic atrophy which is moderate.  No duct dilatation.  Spleen: Normal in size, without focal abnormality.  Adrenals/Urinary Tract: Normal adrenal glands. Renal cortical thinning bilaterally. An upper pole left renal fluid density lesion measures 4.0 cm and is likely a cyst. Again demonstrated is a left renal sinus dominant cyst. 11.8 x 8.2 cm, similar. Interpolar left-sided stone measures 7 mm. Minimal mass effect upon the left renal collecting system secondary to the dominant cyst. This is not significantly changed. No hydroureter or ureteric calculi. No bladder calculi.  Stomach/Bowel: Normal stomach, without wall thickening. Scattered colonic diverticula. Normal terminal ileum. Appendix is not visualized but there is no evidence of right lower quadrant inflammation. Normal small bowel.  Vascular/Lymphatic: Aortic and branch vessel atherosclerosis. Moderate left periaortic  retroperitoneal adenopathy. 2.6 x 2.1 cm on image 30. Compare 9 x 9 mm at the same level on the prior. No pelvic adenopathy.  Reproductive: Hysterectomy.  No adnexal mass.  Other: No significant free fluid.  Lumbosacral spondylosis.  Musculoskeletal: Lumbosacral spondylosis.  IMPRESSION: 1. Similar appearance of the left kidney. Left nephrolithiasis with mild caliectasis, likely secondary to a dominant left renal sinus cyst. 2. No hydroureter or obstructive uropathy. 3. Development of moderate left periaortic retroperitoneal adenopathy. The clinical history describes "Cancer/HCC" . Therefore, considerations include metastatic disease or a lymphoproliferative process such as lymphoma. Consider oncology consultation with eventual PET.   Electronically Signed   By: Abigail Miyamoto M.D.   On: 02/14/2015 12:39    Assessment & Plan:   Problem List Items Addressed This Visit    Abdominal pain, left lower quadrant    She will need a contrasted CT to rule out chronic  appendicitis and diverticulitis given her persistent symptoms and LAD seen on noncontrasted CT. Advised to increase her hydration overnight given the bump in cr and repeat BMET tonight. Lactulosr prn constipation       Hypokalemia - Primary    Not addressed during ER visit,  Repeat k and checking mg given decreased PO intake.   Lab Results  Component Value Date   NA 135 02/14/2015   K 3.3* 02/14/2015   CL 99* 02/14/2015   CO2 30 02/14/2015   Lab Results  Component Value Date   NA 135 02/14/2015   K 3.3* 02/14/2015   CL 99* 02/14/2015   CO2 30 02/14/2015   Lab Results  Component Value Date   CREATININE 1.11* 02/14/2015         Relevant Orders   Magnesium   Basic metabolic panel    Other Visit Diagnoses    Acute left flank pain        Relevant Orders    Sedimentation rate    C-reactive protein    CBC with Differential/Platelet    CT Abdomen Pelvis W Contrast      A total of 40 minutes was spent with patient in face to face time,  more than half of which was spent in reviewing ER visit including labs and CT images , explaining recent labs and imaging studies done, and coordination of care.   I am having Ms. Cotten start on Lactulose and potassium chloride SA. I am also having her maintain her AEROCHAMBER MV, zoster vaccine live (PF), budesonide-formoterol, hyoscyamine, losartan, atorvastatin, metoprolol succinate, triamterene-hydrochlorothiazide, docusate calcium, escitalopram, and tapentadol.  Meds ordered this encounter  Medications  . Lactulose 20 GM/30ML SOLN    Sig: 30 ml every 4 hours until constipation is relieved    Dispense:  236 mL    Refill:  3  . potassium chloride SA (K-DUR,KLOR-CON) 20 MEQ tablet    Sig: Take 1 tablet (20 mEq total) by mouth daily.    Dispense:  30 tablet    Refill:  3    There are no discontinued medications.  Follow-up: No Follow-up on file.   [name], MD

## 2015-02-16 ENCOUNTER — Encounter: Payer: Self-pay | Admitting: Internal Medicine

## 2015-02-16 DIAGNOSIS — R1032 Left lower quadrant pain: Secondary | ICD-10-CM | POA: Insufficient documentation

## 2015-02-16 DIAGNOSIS — E876 Hypokalemia: Secondary | ICD-10-CM | POA: Insufficient documentation

## 2015-02-16 LAB — CBC WITH DIFFERENTIAL/PLATELET
BASOS PCT: 0.3 % (ref 0.0–3.0)
Basophils Absolute: 0 10*3/uL (ref 0.0–0.1)
EOS ABS: 0.1 10*3/uL (ref 0.0–0.7)
EOS PCT: 1.5 % (ref 0.0–5.0)
HEMATOCRIT: 36.2 % (ref 36.0–46.0)
Hemoglobin: 12.1 g/dL (ref 12.0–15.0)
LYMPHS PCT: 27.6 % (ref 12.0–46.0)
Lymphs Abs: 2.4 10*3/uL (ref 0.7–4.0)
MCHC: 33.3 g/dL (ref 30.0–36.0)
MCV: 97.2 fl (ref 78.0–100.0)
Monocytes Absolute: 0.6 10*3/uL (ref 0.1–1.0)
Monocytes Relative: 7.3 % (ref 3.0–12.0)
NEUTROS ABS: 5.5 10*3/uL (ref 1.4–7.7)
Neutrophils Relative %: 63.3 % (ref 43.0–77.0)
PLATELETS: 213 10*3/uL (ref 150.0–400.0)
RBC: 3.72 Mil/uL — ABNORMAL LOW (ref 3.87–5.11)
RDW: 14.1 % (ref 11.5–15.5)
WBC: 8.7 10*3/uL (ref 4.0–10.5)

## 2015-02-16 LAB — BASIC METABOLIC PANEL
BUN: 12 mg/dL (ref 6–23)
CALCIUM: 9 mg/dL (ref 8.4–10.5)
CO2: 32 mEq/L (ref 19–32)
CREATININE: 1.11 mg/dL (ref 0.40–1.20)
Chloride: 101 mEq/L (ref 96–112)
GFR: 51.13 mL/min — AB (ref >60.00)
GLUCOSE: 81 mg/dL (ref 70–99)
POTASSIUM: 4.1 meq/L (ref 3.5–5.1)
Sodium: 140 mEq/L (ref 135–145)

## 2015-02-16 LAB — MAGNESIUM: Magnesium: 1.9 mg/dL (ref 1.5–2.5)

## 2015-02-16 LAB — C-REACTIVE PROTEIN: CRP: 1.2 mg/dL (ref 0.5–20.0)

## 2015-02-16 LAB — SEDIMENTATION RATE: Sed Rate: 28 mm/hr — ABNORMAL HIGH (ref 0–22)

## 2015-02-16 NOTE — Assessment & Plan Note (Signed)
She will need a contrasted CT to rule out chronic appendicitis and diverticulitis given her persistent symptoms and LAD seen on noncontrasted CT. Advised to increase her hydration overnight given the bump in cr and repeat BMET tonight.

## 2015-02-16 NOTE — Assessment & Plan Note (Signed)
Not addressed during ER visit,  Repeat k and checking mg given decreased PO intake.   Lab Results  Component Value Date   NA 135 02/14/2015   K 3.3* 02/14/2015   CL 99* 02/14/2015   CO2 30 02/14/2015   Lab Results  Component Value Date   NA 135 02/14/2015   K 3.3* 02/14/2015   CL 99* 02/14/2015   CO2 30 02/14/2015   Lab Results  Component Value Date   CREATININE 1.11* 02/14/2015

## 2015-02-17 ENCOUNTER — Telehealth: Payer: Self-pay

## 2015-02-17 NOTE — Telephone Encounter (Signed)
Patient called the triage line and requested a call back, attempted to call and left message to call back herself.

## 2015-02-19 ENCOUNTER — Ambulatory Visit
Admission: RE | Admit: 2015-02-19 | Discharge: 2015-02-19 | Disposition: A | Discharge status: Home / Self Care | Payer: PPO | Source: Ambulatory Visit | Attending: Internal Medicine | Admitting: Internal Medicine

## 2015-02-19 DIAGNOSIS — N281 Cyst of kidney, acquired: Secondary | ICD-10-CM | POA: Insufficient documentation

## 2015-02-19 DIAGNOSIS — R109 Unspecified abdominal pain: Secondary | ICD-10-CM | POA: Diagnosis present

## 2015-02-19 DIAGNOSIS — K5730 Diverticulosis of large intestine without perforation or abscess without bleeding: Secondary | ICD-10-CM | POA: Insufficient documentation

## 2015-02-19 MED ORDER — IOHEXOL 300 MG/ML  SOLN
80.0000 mL | Freq: Once | INTRAMUSCULAR | Status: AC | PRN
  Administered 2015-02-19: 80 mL via INTRAVENOUS

## 2015-02-22 ENCOUNTER — Ambulatory Visit (INDEPENDENT_AMBULATORY_CARE_PROVIDER_SITE_OTHER): Payer: PPO | Admitting: Family Medicine

## 2015-02-22 ENCOUNTER — Encounter: Payer: Self-pay | Admitting: Family Medicine

## 2015-02-22 VITALS — BP 140/70 | HR 61 | Temp 98.0°F | Wt 142.0 lb

## 2015-02-22 DIAGNOSIS — B029 Zoster without complications: Secondary | ICD-10-CM | POA: Diagnosis not present

## 2015-02-22 DIAGNOSIS — N179 Acute kidney failure, unspecified: Secondary | ICD-10-CM | POA: Diagnosis not present

## 2015-02-22 DIAGNOSIS — Q6100 Congenital renal cyst, unspecified: Secondary | ICD-10-CM

## 2015-02-22 DIAGNOSIS — N281 Cyst of kidney, acquired: Secondary | ICD-10-CM | POA: Insufficient documentation

## 2015-02-22 DIAGNOSIS — K5732 Diverticulitis of large intestine without perforation or abscess without bleeding: Secondary | ICD-10-CM | POA: Diagnosis not present

## 2015-02-22 DIAGNOSIS — R590 Localized enlarged lymph nodes: Secondary | ICD-10-CM

## 2015-02-22 LAB — BASIC METABOLIC PANEL
BUN: 11 mg/dL (ref 6–23)
CALCIUM: 9 mg/dL (ref 8.4–10.5)
CHLORIDE: 103 meq/L (ref 96–112)
CO2: 30 meq/L (ref 19–32)
CREATININE: 0.97 mg/dL (ref 0.40–1.20)
GFR: 59.74 mL/min — ABNORMAL LOW (ref >60.00)
Glucose, Bld: 74 mg/dL (ref 70–99)
Potassium: 3.8 mEq/L (ref 3.5–5.1)
Sodium: 139 mEq/L (ref 135–145)

## 2015-02-22 MED ORDER — TAPENTADOL HCL 50 MG PO TABS: 50.0000 mg | ORAL_TABLET | Freq: Four times a day (QID) | ORAL | Status: DC | PRN

## 2015-02-22 MED ORDER — AMOXICILLIN-POT CLAVULANATE 875-125 MG PO TABS: 1.0000 | ORAL_TABLET | Freq: Two times a day (BID) | ORAL | Status: DC

## 2015-02-22 NOTE — Progress Notes (Signed)
Pre visit review using our clinic review tool, if applicable. No additional management support is needed unless otherwise documented below in the visit note. 

## 2015-02-22 NOTE — Assessment & Plan Note (Signed)
Rash appearance and distribution consistent with shingles. She is >72 hours from onset of rash and thus antiviral therapy would not be of great benefit. Will treat with pain control with nucynta. Discussed potential for serotonin syndrome with nucynta and lexapro. Patient has taken this combination in the past with no adverse effects. Advised of symptoms of this and patient acknowledged the possibility of this and would like to proceed with this for pain management. Given return precautions.

## 2015-02-22 NOTE — Assessment & Plan Note (Signed)
Patient with periaortic mass likely an enlarged lymph node and retroperitoneal lymph nodes concerning for possible malignant process. Could potentially be inflammatory reaction, though location does not seem to correlate with colonic area of inflammation. Has history of non-melanoma skin cancer. No other known cancers. No sweats, chills, or weight loss of unknown origin. Had discussion regarding further work up of these lesions and patient opted for oncology referral. Will place referral.

## 2015-02-22 NOTE — Patient Instructions (Addendum)
Nice to meet you. We will treat you with augmentin for the diverticulitis.  We will treat your pain from shingles.  Please drink a clear liquid diet for the next 3-4 days. If you develop mental status changes (eg, agitation, hallucinations, delirium, coma); autonomic instability (eg, tachycardia, labile blood pressure, diaphoresis); neuromuscular changes (eg, tremor, rigidity, myoclonus); GI symptoms (eg, nausea, vomiting, diarrhea); and/or seizures please seek medical attention.  If you develop abdominal pain, nausea, vomiting, diarrhea, fever, chills, sweats, worsening rash, or feel poorly please seek medical attention.

## 2015-02-22 NOTE — Assessment & Plan Note (Signed)
Findings of acute diverticulitis on CT scan last week. Has continued symptoms of pain in left flank and left lower quadrant. Given persistent symptoms will treat for diverticulitis with augmentin. Benign abdominal exam today. Discomfort could also be related to shingles. Clear liquid diet. Given return precautions.

## 2015-02-22 NOTE — Progress Notes (Signed)
Patient ID: [name], female   DOB: [birth year], 74 y.o.   MRN: [id number]  Tommi Rumps, MD Phone: [phone]  Whitney Molina is a 75 y.o. female who presents today for f/u.  Shingles: notes onset of pain in left flank and left anterior abdomen 1.5 weeks ago. No rash until last Thursday. Has grouping of erythematous papules, though no vesicles yet. The rash is painful. No new lesions since yesterday. No history of shingles. No rash elsewhere. No crusting. Has been seen in ED and by PCP for this pain, though had no rash at that time.   Diverticulitis: patient with onset of pain 1.5 weeks ago. Seen in ED for this and by PCP for this. CT abd/pelvis w contrast performed last week and patient found to have diverticulitis in left lower abdomen. Also noted to have periaortic mass and retroperitoneal lymph nodes. Also with renal cysts noted as well. She denies abdominal pain, nausea, vomiting, diarrhea, fevers, chills, and sweats. Has history of total hysterectomy and BSO. Has been losing weight, though has been trying to lose weight. She is up to date on her cancer screening. Urinating normally. No elevation of WBC last week. CRP in normal range. ESR normal range for age.   PMH: nonsmoker.    ROS see HPI  Objective  Physical Exam Filed Vitals:   02/22/15 0950  BP: 140/70  Pulse: 61  Temp: 98 F (36.7 C)    Physical Exam  Constitutional: She is well-developed, well-nourished, and in no distress.  Cardiovascular: Normal rate, regular rhythm and normal heart sounds.  Exam reveals no gallop and no friction rub.   No murmur heard. Pulmonary/Chest: Effort normal and breath sounds normal. No respiratory distress. She has no wheezes. She has no rales.  Abdominal: Soft. Bowel sounds are normal. She exhibits no distension. There is no tenderness. There is no rebound and no guarding.  Neurological: She is alert. Gait normal.  Skin: Skin is warm and dry. She is not diaphoretic.    Left L1 dermatome with 2-3 clusters of 3-4 erythematous papules with no vesicles or pustules, no induration or fluctuance, no drainage     Assessment/Plan: Please see individual problem list.  Shingles Rash appearance and distribution consistent with shingles. She is >72 hours from onset of rash and thus antiviral therapy would not be of great benefit. Will treat with pain control with nucynta. Discussed potential for serotonin syndrome with nucynta and lexapro. Patient has taken this combination in the past with no adverse effects. Advised of symptoms of this and patient acknowledged the possibility of this and would like to proceed with this for pain management. Given return precautions.   Diverticulitis of colon Findings of acute diverticulitis on CT scan last week. Has continued symptoms of pain in left flank and left lower quadrant. Given persistent symptoms will treat for diverticulitis with augmentin. Benign abdominal exam today. Discomfort could also be related to shingles. Clear liquid diet. Given return precautions.   Abdominal lymphadenopathy Patient with periaortic mass likely an enlarged lymph node and retroperitoneal lymph nodes concerning for possible malignant process. Could potentially be inflammatory reaction, though location does not seem to correlate with colonic area of inflammation. Has history of non-melanoma skin cancer. No other known cancers. No sweats, chills, or weight loss of unknown origin. Had discussion regarding further work up of these lesions and patient opted for oncology referral. Will place referral.   Renal cyst Noted on CT scan. Has mild caliectasis with this. Mildly decreased renal  function in the past several months. Normal urination. Will check BMET. Will refer to urology for further evaluation.     Orders Placed This Encounter  Procedures  . Basic Metabolic Panel (BMET)  . Ambulatory referral to Oncology    Referral Priority:  Routine    Referral  Type:  Consultation    Referral Reason:  Specialty Services Required    Number of Visits Requested:  1  . Ambulatory referral to Urology    Referral Priority:  Routine    Referral Type:  Consultation    Referral Reason:  Specialty Services Required    Requested Specialty:  Urology    Number of Visits Requested:  1    Meds ordered this encounter  Medications  . amoxicillin-clavulanate (AUGMENTIN) 875-125 MG tablet    Sig: Take 1 tablet by mouth 2 (two) times daily.    Dispense:  14 tablet    Refill:  0  . tapentadol (NUCYNTA) 50 MG TABS tablet    Sig: Take 1 tablet (50 mg total) by mouth every 6 (six) hours as needed for moderate pain.    Dispense:  20 tablet    Refill:  0   Tommi Rumps

## 2015-02-22 NOTE — Assessment & Plan Note (Addendum)
Noted on CT scan. Has mild caliectasis with this. Mildly decreased renal function in the past several months. Normal urination. Will check BMET. Will refer to urology for further evaluation.

## 2015-02-23 ENCOUNTER — Encounter: Payer: Self-pay | Admitting: Family Medicine

## 2015-02-25 ENCOUNTER — Encounter: Payer: Self-pay | Admitting: Family Medicine

## 2015-02-25 ENCOUNTER — Ambulatory Visit (INDEPENDENT_AMBULATORY_CARE_PROVIDER_SITE_OTHER): Payer: PPO | Admitting: Family Medicine

## 2015-02-25 VITALS — BP 122/82 | HR 66 | Temp 98.3°F | Ht 63.0 in | Wt 146.4 lb

## 2015-02-25 DIAGNOSIS — R590 Localized enlarged lymph nodes: Secondary | ICD-10-CM | POA: Diagnosis not present

## 2015-02-25 DIAGNOSIS — Q6100 Congenital renal cyst, unspecified: Secondary | ICD-10-CM | POA: Diagnosis not present

## 2015-02-25 DIAGNOSIS — K5732 Diverticulitis of large intestine without perforation or abscess without bleeding: Secondary | ICD-10-CM | POA: Diagnosis not present

## 2015-02-25 DIAGNOSIS — B029 Zoster without complications: Secondary | ICD-10-CM

## 2015-02-25 DIAGNOSIS — N281 Cyst of kidney, acquired: Secondary | ICD-10-CM

## 2015-02-25 MED ORDER — LORATADINE 10 MG PO TABS: 10.0000 mg | ORAL_TABLET | Freq: Every day | ORAL | Status: DC

## 2015-02-25 MED ORDER — VALACYCLOVIR HCL 1 G PO TABS: 1000.0000 mg | ORAL_TABLET | Freq: Two times a day (BID) | ORAL | Status: DC

## 2015-02-25 NOTE — Assessment & Plan Note (Signed)
Pain is resolved. Patient is much improved. No symptoms at this time. She will complete the course of Augmentin. She would prefer to stick with a clear liquid diet until she completes the course of antibiotics. Abdominal exam benign today. Patient well appearing. We will continue to monitor. Given return precautions.

## 2015-02-25 NOTE — Patient Instructions (Signed)
Nice to see you. Please complete the course of antibiotics.  Please continue to take the nucynta as needed for pain.  We will start you on claritin for itching.  Keep the rash covered. Avoid touching or scratching the rash. Wash your hands often to prevent the spread of varicella zoster virus. Until your rash has developed crusts, avoid contact with  pregnant women who have never had chickenpox or the chickenpox vaccine;  premature or low birth weight infants; and  people with weakened immune systems, such as people receiving immunosuppressive medications or undergoing chemotherapy, organ transplant recipients, and people with human immunodeficiency virus (HIV) infection.   If you develop abdominal pain, fever, worsening rash, pain, nausea, vomiting, diarrhea, or feel poorly please seek medical attention.

## 2015-02-25 NOTE — Progress Notes (Signed)
Patient ID: Whitney Molina, female   DOB: 1940-10-17, 74 y.o.   MRN: [id number]  Tommi Rumps, MD Phone: [phone]  Whitney Molina is a 74 y.o. female who presents today for follow-up.  Diverticulitis: Patient reports pain is much improved. Previously had left lower quadrant abdominal pain. Now there is no pain. Denies nausea vomiting and diarrhea. No she has been taking the Augmentin. Has 4 more days of this. No fever.   Shingles: Patient reports she still has rash in the left L1 dermatome. Notes there have been a few more patches of erythematous papules come up yesterday. They are painful. Some of the previous papules have begun to crust. She has been taking the Nucynta once a day. Since this helps with her pain. There is some mild itching with this. She notes no additional rash elsewhere. No fevers. She denies contact with pregnant women, immunosuppressed people, and people who have not had chickenpox (with the exception of her husband who is unsure if he has had chickenpox).  Kidney cyst: Patient notes she saw urology yesterday. States that they were aware of the cyst. She has a kidney stone as well. Per the patient urology will plan to monitor this moving forward.  Patient additionally notes that she is going to see oncology next week. This is for follow-up of her prior CT scan with possible intra-abdominal and retroperitoneal enlarged lymph nodes. She notes that she has had a single area in her thoracic paraspinous muscles that have been tender intermittently for decades. She was unsure if this had anything to do with the enlarged lymph nodes or mass in her abdomen.  PMH: nonsmoker   ROS See history of present illness.  Objective  Physical Exam Filed Vitals:   02/25/15 1014  BP: 122/82  Pulse: 66  Temp: 98.3 F (36.8 C)    Physical Exam  Constitutional: She is well-developed, well-nourished, and in no distress.  HENT:  Head: Normocephalic and atraumatic.    Cardiovascular: Normal rate, regular rhythm and normal heart sounds.  Exam reveals no gallop and no friction rub.   No murmur heard. Pulmonary/Chest: Effort normal and breath sounds normal. No respiratory distress. She has no wheezes. She has no rales.  Abdominal: Soft. Bowel sounds are normal. She exhibits no distension. There is no tenderness. There is no rebound and no guarding.  Musculoskeletal:  No midline spine tenderness, minimal tenderness right paraspinous muscles midthoracic region, no step off. No swelling. No erythema. No mass lesion.  Neurological:  5 out of 5 strength in bilateral quads, hamstrings, plantar flexion, dorsiflexion, sensation to light touch intact in bilateral lower extremities, 2+ patellar reflex bilaterally  Skin: Skin is warm and dry. She is not diaphoretic.  L1 dermatome with scattered erythematous papules, there are 2-3 more clusters of these papules today, prior papules appear to have a scab on them, no surrounding erythema and no warmth     Assessment/Plan: Please see individual problem list.  Diverticulitis of colon Pain is resolved. Patient is much improved. No symptoms at this time. She will complete the course of Augmentin. She would prefer to stick with a clear liquid diet until she completes the course of antibiotics. Abdominal exam benign today. Patient well appearing. We will continue to monitor. Given return precautions.  Shingles Patient with continued rash in the left L1 dermatome. Rash is consistent with shingles given distribution and appearance. She had new lesions yesterday. Had a discussion with the patient regarding benefit of antiviral medication. Advised that given  greater than 72 hours of symptoms there is an unknown benefit to antiviral medicine though with new lesions she could benefit from this medicine potentially to help limit further replication of the virus and curtail the illness. Patient opted for treatment with Valtrex given  continued occurrence of lesions. Creatinine clearance is calculated to be a range of 47-54 and given this we'll treat with Valtrex 1 g twice a day for 7 days. Claritin for itching. Advised patient to avoid contact with immunosuppressed people, pregnant women, and people who have not had chickenpox. She is advised to keep the lesions covered up. She was advised to do these things until the lesions crust over. Discussed avoiding contact with her husband who is unsure of his chickenpox status. Given return precautions.   Abdominal lymphadenopathy Has follow-up with oncology next week. Do not suspect that the area in the paraspinous muscles of her back is related to the lymphadenopathy/mass seen on CT abdomen and pelvis. She is neurologically intact with regards to the tenderness. There was minimal discomfort today. This is a chronic issue that has been going on for many decades. There is no apparent abnormality or mass lesion. Could be that this area is a trigger point. Advised patient to mention this to oncologist when she sees them.  Renal cyst Evaluated by urology yesterday. Per patient they'll continue to monitor this.    Meds ordered this encounter  Medications  . loratadine (CLARITIN) 10 MG tablet    Sig: Take 1 tablet (10 mg total) by mouth daily.    Dispense:  30 tablet    Refill:  0  . valACYclovir (VALTREX) 1000 MG tablet    Sig: Take 1 tablet (1,000 mg total) by mouth 2 (two) times daily.    Dispense:  14 tablet    Refill:  0    Tommi Rumps

## 2015-02-25 NOTE — Progress Notes (Signed)
Pre visit review using our clinic review tool, if applicable. No additional management support is needed unless otherwise documented below in the visit note. 

## 2015-02-25 NOTE — Assessment & Plan Note (Signed)
Evaluated by urology yesterday. Per patient they'll continue to monitor this.

## 2015-02-25 NOTE — Assessment & Plan Note (Signed)
Has follow-up with oncology next week. Do not suspect that the area in the paraspinous muscles of her back is related to the lymphadenopathy/mass seen on CT abdomen and pelvis. She is neurologically intact with regards to the tenderness. There was minimal discomfort today. This is a chronic issue that has been going on for many decades. There is no apparent abnormality or mass lesion. Could be that this area is a trigger point. Advised patient to mention this to oncologist when she sees them.

## 2015-02-25 NOTE — Assessment & Plan Note (Addendum)
Patient with continued rash in the left L1 dermatome. Rash is consistent with shingles given distribution and appearance. She had new lesions yesterday. Had a discussion with the patient regarding benefit of antiviral medication. Advised that given greater than 72 hours of symptoms there is an unknown benefit to antiviral medicine though with new lesions she could benefit from this medicine potentially to help limit further replication of the virus and curtail the illness. Patient opted for treatment with Valtrex given continued occurrence of lesions. Creatinine clearance is calculated to be a range of 47-54 and given this we'll treat with Valtrex 1 g twice a day for 7 days. Claritin for itching. Advised patient to avoid contact with immunosuppressed people, pregnant women, and people who have not had chickenpox. She is advised to keep the lesions covered up. She was advised to do these things until the lesions crust over. Discussed avoiding contact with her husband who is unsure of his chickenpox status. Given return precautions.

## 2015-03-02 ENCOUNTER — Encounter: Payer: Self-pay | Admitting: Internal Medicine

## 2015-03-02 ENCOUNTER — Inpatient Hospital Stay: Payer: PPO | Attending: Internal Medicine | Admitting: Internal Medicine

## 2015-03-02 VITALS — BP 166/82 | HR 56 | Temp 97.8°F | Resp 18 | Ht 64.0 in | Wt 141.1 lb

## 2015-03-02 DIAGNOSIS — Z87442 Personal history of urinary calculi: Secondary | ICD-10-CM | POA: Insufficient documentation

## 2015-03-02 DIAGNOSIS — I7090 Unspecified atherosclerosis: Secondary | ICD-10-CM | POA: Diagnosis not present

## 2015-03-02 DIAGNOSIS — N281 Cyst of kidney, acquired: Secondary | ICD-10-CM | POA: Diagnosis not present

## 2015-03-02 DIAGNOSIS — C8590 Non-Hodgkin lymphoma, unspecified, unspecified site: Secondary | ICD-10-CM | POA: Insufficient documentation

## 2015-03-02 DIAGNOSIS — E785 Hyperlipidemia, unspecified: Secondary | ICD-10-CM | POA: Diagnosis not present

## 2015-03-02 DIAGNOSIS — Z79899 Other long term (current) drug therapy: Secondary | ICD-10-CM | POA: Diagnosis not present

## 2015-03-02 DIAGNOSIS — R590 Localized enlarged lymph nodes: Secondary | ICD-10-CM | POA: Diagnosis not present

## 2015-03-02 DIAGNOSIS — R011 Cardiac murmur, unspecified: Secondary | ICD-10-CM | POA: Diagnosis not present

## 2015-03-02 DIAGNOSIS — K5792 Diverticulitis of intestine, part unspecified, without perforation or abscess without bleeding: Secondary | ICD-10-CM | POA: Diagnosis not present

## 2015-03-02 DIAGNOSIS — I517 Cardiomegaly: Secondary | ICD-10-CM | POA: Diagnosis not present

## 2015-03-02 DIAGNOSIS — Z8589 Personal history of malignant neoplasm of other organs and systems: Secondary | ICD-10-CM | POA: Diagnosis not present

## 2015-03-02 DIAGNOSIS — I10 Essential (primary) hypertension: Secondary | ICD-10-CM | POA: Insufficient documentation

## 2015-03-02 DIAGNOSIS — R634 Abnormal weight loss: Secondary | ICD-10-CM | POA: Diagnosis not present

## 2015-03-02 DIAGNOSIS — C8280 Other types of follicular lymphoma, unspecified site: Secondary | ICD-10-CM

## 2015-03-02 DIAGNOSIS — Z86010 Personal history of colonic polyps: Secondary | ICD-10-CM | POA: Diagnosis not present

## 2015-03-02 DIAGNOSIS — K219 Gastro-esophageal reflux disease without esophagitis: Secondary | ICD-10-CM

## 2015-03-02 NOTE — Patient Instructions (Addendum)
Lymphadenopathy  Lymphadenopathy refers to swollen or enlarged lymph glands, also called lymph nodes. Lymph glands are part of your body's defense (immune) system, which protects the body from infections, germs, and diseases. Lymph glands are found in many locations in your body, including the neck, underarm, and groin.  Many things can cause lymph glands to become enlarged. When your immune system responds to germs, such as viruses or bacteria, infection-fighting cells and fluid build up. This causes the glands to grow in size. Usually, this is not something to worry about. The swelling and any soreness often go away without treatment. However, swollen lymph glands can also be caused by a number of diseases. Your health care provider may do various tests to help determine the cause. If the cause of your swollen lymph glands cannot be found, it is important to monitor your condition to make sure the swelling goes away.  HOME CARE INSTRUCTIONS Watch your condition for any changes. The following actions may help to lessen any discomfort you are feeling:  Get plenty of rest.  Take medicines only as directed by your health care provider. Your health care provider may recommend over-the-counter medicines for pain.  Apply moist heat compresses to the site of swollen lymph nodes as directed by your health care provider. This can help reduce any pain.  Check your lymph nodes daily for any changes.  Keep all follow-up visits as directed by your health care provider. This is important.  SEEK MEDICAL CARE IF:  Your lymph nodes are still swollen after 2 weeks.  Your swelling increases or spreads to other areas.  Your lymph nodes are hard, seem fixed to the skin, or are growing rapidly.  Your skin over the lymph nodes is red and inflamed.  You have a fever.  You have chills.  You have fatigue.  You develop a sore throat.  You have abdominal pain.  You have weight loss.  You have night  sweats.  SEEK IMMEDIATE MEDICAL CARE IF:  You notice fluid leaking from the area of the enlarged lymph node.  You have severe pain in any area of your body.  You have chest pain.  You have shortness of breath.   This information is not intended to replace advice given to you by your health care provider. Make sure you discuss any questions you have with your health care provider.   Document Released: 02/08/2008 Document Revised: 05/22/2014 Document Reviewed: 12/04/2013 Elsevier Interactive Patient Education 2016 [employer]   PET Scan A PET scan, also called positron emission tomography, is a test that creates pictures of the inside of the body. A PET scan requires a small dose of a harmless radioactive material to be injected into a vein. When this material combines with certain substances in the body, it produces tiny particles that can be detected by a scanner and converted into pictures.  The pictures created during a PET scan can be used to study a disease. They are often used to study cancer and cancer therapy. The colors and brightness on the pictures show different levels of organ and tissue function. For example, cancer tissue appears brighter than normal tissue on a PET scan picture. LET [facility] CARE PROVIDER KNOW ABOUT:   Any allergies you have.  All medicines you are taking, including vitamins, herbs, eye drops, creams, and over-the-counter medicines.  Previous problems you or members of your family have had with the use of anesthetics.  Any blood disorders you have.  Previous surgeries  you have had.  Medical conditions you have.  If you are afraid of cramped spaces (claustrophobic). If claustrophobia is a problem, it usually can be relieved with mild sedatives or antianxiety medicines.  If you have trouble staying still for long periods of time. BEFORE THE PROCEDURE   Do not eat or drink anything after midnight on the night before the procedure or as  directed by your health care provider.  Take medicines only as directed by your health care provider.  If you have diabetes, ask your health care provider for diet guidelines to control sugar (glucose) levels on the day of the test. PROCEDURE   A small amount of radioactive material will be injected into a vein. The test will begin 30-60 minutes after the injection, when the material has traveled around your body.  You will lie on a cushioned table, and the table will be moved through the center of a machine that looks like a large donut. It will take about 30-60 minutes for the machine to produce pictures of your body. You will need to stay still during this time. AFTER THE PROCEDURE  You may resume your normal diet and activities.  Drink several 8 oz glasses of water following the test to flush the radioactive material out of your body.   This information is not intended to replace advice given to you by your health care provider. Make sure you discuss any questions you have with your health care provider.   Document Released: 11/05/2002 Document Revised: 05/22/2014 Document Reviewed: 08/13/2013 Elsevier Interactive Patient Education [employer].

## 2015-03-02 NOTE — Progress Notes (Signed)
[place] NOTE  Patient Care Team: [name], MD as PCP - General (Internal Medicine) Robert Bellow, MD (General Surgery)  CHIEF COMPLAINTS/PURPOSE OF CONSULTATION:   Abdominal/retroperitoneal adenopathy  HISTORY OF PRESENTING ILLNESS:  Whitney Molina 74 y.o.  female with no prior history of malignancy noted to have recent bout of diverticulitis with diarrhea for about 3 weeks. She was recently seen in the emergency room for flank pain for which she got a renal protocol CT that was suggestive of adenopathy in the retroperitoneum. This was appropriately followed by a CT of the abdomen and pelvis with contrast that showed about a 2 cm left periaortic lymph node.   Patient admits to mild weight loss of about 6 pounds in the last few weeks which is attributed to her Liquid diet for diverticulitis. She denies any unusual fevers or chills. Denies any night sweats. Denies any new lumps or bumps.  ROS: A complete 10 point review of system is done which is negative except mentioned above in history of present illness  MEDICAL HISTORY:  Past Medical History  Diagnosis Date  . Bronchitis   . Hypertension   . Hyperlipidemia   . Hemorrhoids   . Heart murmur   . GERD (gastroesophageal reflux disease)   . History of kidney stones   . History of colon polyps   . 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 Basal cell carcinoma of nose   . History of mammogram 2016    SURGICAL HISTORY: Past Surgical History  Procedure Laterality Date  . Tonsillectomy  1971  . Tubal ligation  1975  . Abdominal hysterectomy  1984  . Vaginal prolapse repair  July 2001    Pelvic Prolapse  . Cholecystectomy  06-01-03  . Lithotripsy  2005  . Bladder suspension  2010  . Local exicsion skin cancer  2004    squamous cell of chest wall. left chest  . Colonoscopy  2003    SOCIAL HISTORY: Social History   Social History  . Marital  Status: Married    Spouse Name: N/A  . Number of Children: N/A  . Years of Education: N/A   Occupational History  . Not on file.   Social History Main Topics  . Smoking status: Never Smoker   . Smokeless tobacco: Never Used  . Alcohol Use: No  . Drug Use: No  . Sexual Activity: Not on file   Other Topics Concern  . Not on file   Social History Narrative    FAMILY HISTORY: Family History  Problem Relation Age of Onset  . Cancer Mother     breast   . Stroke Mother   . Stroke Father   . Cancer Brother     bladder  . Stroke Brother     ALLERGIES:  has No Known Allergies.  MEDICATIONS:  Current Outpatient Prescriptions  Medication Sig Dispense Refill  . atorvastatin (LIPITOR) 20 MG tablet Take 2 tablets (40 mg total) by mouth at bedtime. 180 tablet 1  . budesonide-formoterol (SYMBICORT) 160-4.5 MCG/ACT inhaler Inhale 2 puffs into the lungs 2 (two) times daily. 1 Inhaler 6  . escitalopram (LEXAPRO) 10 MG tablet TAKE ONE (1) TABLET BY MOUTH EVERY DAY 90 tablet 4  . hyoscyamine (LEVBID) 0.375 MG 12 hr tablet Take 1 tablet (0.375 mg total) by mouth daily. 90 tablet 1  . loratadine (CLARITIN) 10 MG tablet Take 1 tablet (10 mg  total) by mouth daily. 30 tablet 0  . losartan (COZAAR) 100 MG tablet Take 1 tablet (100 mg total) by mouth daily. 90 tablet 3  . metoprolol succinate (TOPROL-XL) 25 MG 24 hr tablet Take 1 tablet (25 mg total) by mouth daily. 90 tablet 3  . potassium chloride SA (K-DUR,KLOR-CON) 20 MEQ tablet Take 1 tablet (20 mEq total) by mouth daily. 30 tablet 3  . Spacer/Aero-Holding Chambers (AEROCHAMBER MV) inhaler Use as instructed 1 each 0  . tapentadol (NUCYNTA) 50 MG TABS tablet Take 1 tablet (50 mg total) by mouth every 6 (six) hours as needed for moderate pain. 20 tablet 0  . triamterene-hydrochlorothiazide (MAXZIDE-25) 37.5-25 MG per tablet Take 0.5 tablets by mouth daily. 90 tablet 3  . valACYclovir (VALTREX) 1000 MG tablet Take 1 tablet (1,000 mg total) by  mouth 2 (two) times daily. 14 tablet 0  . zoster vaccine live, PF, (ZOSTAVAX) 41030 UNT/0.65ML injection Inject 19,400 Units into the skin once. 1 each 0   No current facility-administered medications for this visit.      Marland Kitchen  PHYSICAL EXAMINATION: ECOG PERFORMANCE STATUS: 0 - Asymptomatic  Filed Vitals:   03/02/15 1153  BP: 166/82  Pulse: 56  Temp: 97.8 F (36.6 C)  Resp: 18   Filed Weights   03/02/15 1153  Weight: 141 lb 1.5 oz (64 kg)    GENERAL: Well-nourished well-developed; Alert, no distress and comfortable.   Accompanied her husband. EYES: no pallor or icterus OROPHARYNX: no thrush or ulceration; good dentition  NECK: supple, no masses felt LYMPH:  no palpable lymphadenopathy in the cervical, axillary or inguinal regions LUNGS: clear to auscultation and  No wheeze or crackles HEART/CVS: regular rate & rhythm and no murmurs; No lower extremity edema ABDOMEN: abdomen soft, non-tender and normal bowel sounds Musculoskeletal:no cyanosis of digits and no clubbing  PSYCH: alert & oriented x 3 with fluent speech NEURO: no focal motor/sensory deficits SKIN:  no rashes or significant lesions  LABORATORY DATA:  I have reviewed the data as listed Lab Results  Component Value Date   WBC 8.7 02/15/2015   HGB 12.1 02/15/2015   HCT 36.2 02/15/2015   MCV 97.2 02/15/2015   PLT 213.0 02/15/2015    Recent Labs  06/24/14 0956  12/23/14 1049 02/14/15 0726 02/15/15 1833 02/22/15 1034  NA 138  < > 140 135 140 139  K 3.8  < > 4.1 3.3* 4.1 3.8  CL 103  < > 102 99* 101 103  CO2 28  < > 30 30 32 30  GLUCOSE 97  < > 93 99 81 74  BUN 14  < > 16 12 12 11   CREATININE 0.86  < > 1.05 1.11* 1.11 0.97  CALCIUM 9.5  < > 9.6 9.1 9.0 9.0  GFRNONAA  --   --   --  48*  --   --   GFRAA  --   --   --  56*  --   --   PROT 7.9  --  7.9 7.5  --   --   ALBUMIN 4.3  --  4.3 4.0  --   --   AST 21  --  19 20  --   --   ALT 22  --  17 18  --   --   ALKPHOS 81  --  72 63  --   --   BILITOT  0.6  --  0.7 0.8  --   --   BILIDIR  --   --   --  <  0.1*  --   --   IBILI  --   --   --  NOT CALCULATED  --   --   < > = values in this interval not displayed.  RADIOGRAPHIC STUDIES: I have personally reviewed the radiological images as listed and agreed with the findings in the report. Ct Abdomen Pelvis W Contrast  02/19/2015  CLINICAL DATA:  Patient with left flank pain radiating to the left lower quadrant for 2 weeks. Diarrhea. EXAM: CT ABDOMEN AND PELVIS WITH CONTRAST TECHNIQUE: Multidetector CT imaging of the abdomen and pelvis was performed using the standard protocol following bolus administration of intravenous contrast. CONTRAST:  10mL OMNIPAQUE IOHEXOL 300 MG/ML  SOLN COMPARISON:  CT abdomen pelvis 02/14/2015 FINDINGS: Lower chest: No consolidative opacities. No pleural effusion. Normal heart size. Hepatobiliary: Liver is normal in size and contour. No focal hepatic lesion is identified. Status post cholecystectomy. No intrahepatic or extrahepatic biliary ductal dilatation. Pancreas: Stable 5 mm cystic lesion within the uncinate process (image 28; series 2). No evidence for ductal dilatation. No inflammatory process. Spleen: Unremarkable Adrenals/Urinary Tract: The adrenal glands are normal. Unchanged 11.7 x 8.7 cm cyst within the left renal sinus. Additionally there is an unchanged 4 cm cyst within the superior pole of the left kidney. Unchanged nonobstructing stone measuring 7 mm within the inferior pole of the left kidney. Persistent mass effect from these large cysts on the left renal collecting system which is mildly dilated. The right kidney is unremarkable. Urinary bladder is unremarkable. Stomach/Bowel: Extensive sigmoid colonic diverticulosis. Mild inflammatory change about a descending colonic diverticulum (image 49; series 2). No evidence for bowel obstruction. Vascular/Lymphatic: Normal caliber abdominal aorta. Unchanged 2.3 x 2.2 cm left periaortic mass favored represent an enlarged  lymph node (image 27; series 2). Multiple additional predominantly sub cm retroperitoneal lymph nodes are demonstrated. Other: Status post hysterectomy. Musculoskeletal: Lower lumbar spine degenerative changes. No aggressive or acute appearing osseous lesions. IMPRESSION: Focal inflammation about a diverticulum off of the descending colon compatible with acute diverticulitis. No evidence for perforation or surrounding abscess formation. Re- demonstrated left periaortic mass favored to represent adenopathy. This may represent metastatic disease or a lymphoproliferative process such as lymphoma. Consider oncologic referral and/or PET-CT for further evaluation. Re- demonstrated dominant left renal cyst which results in mild left caliectasis likely secondary to mass effect. Unchanged 7 mm nonobstructing stone inferior pole left kidney. Electronically Signed   By: Lovey Newcomer M.D.   On: 02/19/2015 16:14   Ct Renal Stone Study  02/14/2015  CLINICAL DATA:  Left lower quadrant and left flank pain beginning on Tuesday. History kidney stones. EXAM: CT ABDOMEN AND PELVIS WITHOUT CONTRAST TECHNIQUE: Multidetector CT imaging of the abdomen and pelvis was performed following the standard protocol without IV contrast. COMPARISON:  03/30/2013 FINDINGS: Lower chest: Clear lung bases. Mild cardiomegaly, without pericardial or pleural effusion. Hepatobiliary: Normal liver. Cholecystectomy, without biliary ductal dilatation. Pancreas: Pancreatic atrophy which is moderate.  No duct dilatation. Spleen: Normal in size, without focal abnormality. Adrenals/Urinary Tract: Normal adrenal glands. Renal cortical thinning bilaterally. An upper pole left renal fluid density lesion measures 4.0 cm and is likely a cyst. Again demonstrated is a left renal sinus dominant cyst. 11.8 x 8.2 cm, similar. Interpolar left-sided stone measures 7 mm. Minimal mass effect upon the left renal collecting system secondary to the dominant cyst. This is not  significantly changed. No hydroureter or ureteric calculi. No bladder calculi. Stomach/Bowel: Normal stomach, without wall thickening. Scattered colonic diverticula. Normal terminal ileum.  Appendix is not visualized but there is no evidence of right lower quadrant inflammation. Normal small bowel. Vascular/Lymphatic: Aortic and branch vessel atherosclerosis. Moderate left periaortic retroperitoneal adenopathy. 2.6 x 2.1 cm on image 30. Compare 9 x 9 mm at the same level on the prior. No pelvic adenopathy. Reproductive: Hysterectomy.  No adnexal mass. Other: No significant free fluid.  Lumbosacral spondylosis. Musculoskeletal: Lumbosacral spondylosis. IMPRESSION: 1. Similar appearance of the left kidney. Left nephrolithiasis with mild caliectasis, likely secondary to a dominant left renal sinus cyst. 2. No hydroureter or obstructive uropathy. 3. Development of moderate left periaortic retroperitoneal adenopathy. The clinical history describes "Cancer/HCC" . Therefore, considerations include metastatic disease or a lymphoproliferative process such as lymphoma. Consider oncology consultation with eventual PET. Electronically Signed   By: Abigail Miyamoto M.D.   On: 02/14/2015 12:39    ASSESSMENT & PLAN:   # Retroperitoneal adenopathy approximately 2 cm in size- approximately 2 years ago; that lymph node was was approximately a centimeter in size. The etiology is unclear; lymphoma versus metastatic disease versus inflammatory cause.  I would recommend a PET scan for further evaluation. Patient will follow-up with me in approximately one to 2 weeks to review the results. If this is concerning for malignancy then biopsy would be recommended.  Thank you Dr.Tullo for allowing me to participate in the care of your pleasant patient. Please do not hesitate to contact me with questions or concerns in the interim.  All questions were answered. The patient knows to call the clinic with any problems, questions or  concerns.   I spent 20 minutes counseling the patient face to face. The total time spent in the appointment was 30 minutes and more than 50% was on counseling.     Cammie Sickle, MD 03/02/2015 12:36 PM

## 2015-03-09 ENCOUNTER — Other Ambulatory Visit: Payer: Self-pay | Admitting: *Deleted

## 2015-03-09 DIAGNOSIS — R590 Localized enlarged lymph nodes: Secondary | ICD-10-CM

## 2015-03-09 DIAGNOSIS — R9389 Abnormal findings on diagnostic imaging of other specified body structures: Secondary | ICD-10-CM

## 2015-03-11 ENCOUNTER — Ambulatory Visit: Payer: Self-pay

## 2015-03-11 ENCOUNTER — Ambulatory Visit
Admission: RE | Admit: 2015-03-11 | Discharge: 2015-03-11 | Disposition: A | Discharge status: Home / Self Care | Payer: PPO | Source: Ambulatory Visit | Attending: Internal Medicine | Admitting: Internal Medicine

## 2015-03-11 DIAGNOSIS — C8280 Other types of follicular lymphoma, unspecified site: Secondary | ICD-10-CM | POA: Diagnosis not present

## 2015-03-11 LAB — GLUCOSE, CAPILLARY: Glucose-Capillary: 73 mg/dL (ref 65–99)

## 2015-03-11 MED ORDER — FLUDEOXYGLUCOSE F - 18 (FDG) INJECTION
12.3300 | Freq: Once | INTRAVENOUS | Status: DC | PRN
  Administered 2015-03-11: 12.33 via INTRAVENOUS
  Filled 2015-03-11: qty 12.33

## 2015-03-12 ENCOUNTER — Inpatient Hospital Stay (HOSPITAL_BASED_OUTPATIENT_CLINIC_OR_DEPARTMENT_OTHER): Payer: PPO | Admitting: Internal Medicine

## 2015-03-12 VITALS — BP 156/87 | HR 64 | Temp 96.3°F | Wt 142.2 lb

## 2015-03-12 DIAGNOSIS — R590 Localized enlarged lymph nodes: Secondary | ICD-10-CM

## 2015-03-12 DIAGNOSIS — Z86010 Personal history of colonic polyps: Secondary | ICD-10-CM

## 2015-03-12 DIAGNOSIS — C8218 Follicular lymphoma grade II, lymph nodes of multiple sites: Secondary | ICD-10-CM

## 2015-03-12 DIAGNOSIS — I7090 Unspecified atherosclerosis: Secondary | ICD-10-CM

## 2015-03-12 DIAGNOSIS — Z79899 Other long term (current) drug therapy: Secondary | ICD-10-CM

## 2015-03-12 DIAGNOSIS — K5792 Diverticulitis of intestine, part unspecified, without perforation or abscess without bleeding: Secondary | ICD-10-CM

## 2015-03-12 DIAGNOSIS — I10 Essential (primary) hypertension: Secondary | ICD-10-CM

## 2015-03-12 DIAGNOSIS — Z87442 Personal history of urinary calculi: Secondary | ICD-10-CM

## 2015-03-12 DIAGNOSIS — K219 Gastro-esophageal reflux disease without esophagitis: Secondary | ICD-10-CM

## 2015-03-12 DIAGNOSIS — R011 Cardiac murmur, unspecified: Secondary | ICD-10-CM

## 2015-03-12 DIAGNOSIS — Z8589 Personal history of malignant neoplasm of other organs and systems: Secondary | ICD-10-CM

## 2015-03-12 DIAGNOSIS — N281 Cyst of kidney, acquired: Secondary | ICD-10-CM | POA: Diagnosis not present

## 2015-03-12 DIAGNOSIS — R634 Abnormal weight loss: Secondary | ICD-10-CM | POA: Diagnosis not present

## 2015-03-12 DIAGNOSIS — E785 Hyperlipidemia, unspecified: Secondary | ICD-10-CM

## 2015-03-12 DIAGNOSIS — I517 Cardiomegaly: Secondary | ICD-10-CM

## 2015-03-12 NOTE — Progress Notes (Signed)
[place] OFFICE PROGRESS NOTE  Patient Care Team: [name], MD as PCP - General (Internal Medicine) Robert Bellow, MD (General Surgery)   SUMMARY OF ONCOLOGIC HISTORY:  # RETROPERITONEAL/LEFT Peri-aortic LN ~ 2.5CM [incidental on CT scan in 2016; CT- Nov 2014- ~1CM]/ PET 2016-Left Peri-aortic LN; Suv ~15; Ext Iliac LN 6 mm- Recm Bx  INTERVAL HISTORY:  A very pleasant 74 year old female patient with above history of incidental finding of retroperitoneal adenopathy on the CT scan that was done for diarrhea. She had a PET scan/she is here to review the results.  Diarrhea has improved. Patient still has intermittent pain on the abdominal wall from her recent shingles. Continues to deny any weight loss or any night sweats or fevers.   REVIEW OF SYSTEMS:  A complete 10 point review of system is done which is negative except mentioned above/history of present illness.   PAST MEDICAL HISTORY :  Past Medical History  Diagnosis Date  . Bronchitis   . Hypertension   . Hyperlipidemia   . Hemorrhoids   . Heart murmur   . GERD (gastroesophageal reflux disease)   . History of kidney stones   . History of colon polyps   . Primary squamous cell carcinoma of chest wall ([place]) 2004    removed at [facility]  . Basal cell carcinoma of nose   . History of mammogram 2016    PAST SURGICAL HISTORY :   Past Surgical History  Procedure Laterality Date  . Tonsillectomy  1971  . Tubal ligation  1975  . Abdominal hysterectomy  1984  . Vaginal prolapse repair  July 2001    Pelvic Prolapse  . Cholecystectomy  06-01-03  . Lithotripsy  2005  . Bladder suspension  2010  . Local exicsion skin cancer  2004    squamous cell of chest wall. left chest  . Colonoscopy  2003    FAMILY HISTORY :   Family History  Problem Relation Age of Onset  . Cancer Mother     breast   . Stroke Mother   . Stroke Father   . Cancer Brother     bladder  . Stroke Brother     SOCIAL HISTORY:    Social History  Substance Use Topics  . Smoking status: Never Smoker   . Smokeless tobacco: Never Used  . Alcohol Use: No    ALLERGIES:  has No Known Allergies.  MEDICATIONS:  Current Outpatient Prescriptions  Medication Sig Dispense Refill  . atorvastatin (LIPITOR) 20 MG tablet Take 2 tablets (40 mg total) by mouth at bedtime. 180 tablet 1  . escitalopram (LEXAPRO) 10 MG tablet TAKE ONE (1) TABLET BY MOUTH EVERY DAY 90 tablet 4  . hyoscyamine (LEVBID) 0.375 MG 12 hr tablet Take 1 tablet (0.375 mg total) by mouth daily. 90 tablet 1  . losartan (COZAAR) 100 MG tablet Take 1 tablet (100 mg total) by mouth daily. 90 tablet 3  . metoprolol succinate (TOPROL-XL) 25 MG 24 hr tablet Take 1 tablet (25 mg total) by mouth daily. 90 tablet 3  . potassium chloride SA (K-DUR,KLOR-CON) 20 MEQ tablet Take 1 tablet (20 mEq total) by mouth daily. 30 tablet 3  . triamterene-hydrochlorothiazide (MAXZIDE-25) 37.5-25 MG per tablet Take 0.5 tablets by mouth daily. 90 tablet 3  . valACYclovir (VALTREX) 1000 MG tablet Take 1 tablet (1,000 mg total) by mouth 2 (two) times daily. 14 tablet 0  . zoster vaccine live, PF, (ZOSTAVAX) 49675 UNT/0.65ML injection Inject 19,400  Units into the skin once. 1 each 0  . Spacer/Aero-Holding Chambers (AEROCHAMBER MV) inhaler Use as instructed 1 each 0   No current facility-administered medications for this visit.   Facility-Administered Medications Ordered in Other Visits  Medication Dose Route Frequency Provider Last Rate Last Dose  . fludeoxyglucose F - 18 (FDG) injection 12.33 milli Curie  12.33 milli Curie Intravenous Once PRN Cammie Sickle, MD   12.33 milli Curie at 03/11/15 [phone]    PHYSICAL EXAMINATION: ECOG PERFORMANCE STATUS: 0 - Asymptomatic  BP 156/87 mmHg  Pulse 64  Temp(Src) 96.3 F (35.7 C) (Tympanic)  Wt 142 lb 3.2 oz (64.5 kg)  Filed Weights   03/12/15 1153  Weight: 142 lb 3.2 oz (64.5 kg)    GENERAL: Well-nourished well-developed; Alert,  no distress and comfortable.  Accompanied by husband.  LABORATORY DATA:  I have reviewed the data as listed    Component Value Date/Time   NA 139 02/22/2015 1034   NA 138 09/10/2013 0758   K 3.8 02/22/2015 1034   K 3.3* 09/10/2013 0758   CL 103 02/22/2015 1034   CL 100 09/10/2013 0758   CO2 30 02/22/2015 1034   CO2 32 09/10/2013 0758   GLUCOSE 74 02/22/2015 1034   GLUCOSE 85 09/10/2013 0758   BUN 11 02/22/2015 1034   BUN 13 09/10/2013 0758   CREATININE 0.97 02/22/2015 1034   CREATININE 0.96 12/22/2013 0857   CREATININE 0.73 09/10/2013 0758   CALCIUM 9.0 02/22/2015 1034   CALCIUM 9.2 09/10/2013 0758   PROT 7.5 02/14/2015 0726   PROT 8.3* 09/10/2013 0758   ALBUMIN 4.0 02/14/2015 0726   ALBUMIN 3.5 09/10/2013 0758   AST 20 02/14/2015 0726   AST 23 09/10/2013 0758   ALT 18 02/14/2015 0726   ALT 28 09/10/2013 0758   ALKPHOS 63 02/14/2015 0726   ALKPHOS 78 09/10/2013 0758   BILITOT 0.8 02/14/2015 0726   BILITOT 0.6 09/10/2013 0758   GFRNONAA 48* 02/14/2015 0726   GFRNONAA 59* 12/22/2013 0857   GFRNONAA >60 09/10/2013 0758   GFRAA 56* 02/14/2015 0726   GFRAA 68 12/22/2013 0857   GFRAA >60 09/10/2013 0758    No results found for: SPEP, UPEP  Lab Results  Component Value Date   WBC 8.7 02/15/2015   NEUTROABS 5.5 02/15/2015   HGB 12.1 02/15/2015   HCT 36.2 02/15/2015   MCV 97.2 02/15/2015   PLT 213.0 02/15/2015      Chemistry      Component Value Date/Time   NA 139 02/22/2015 1034   NA 138 09/10/2013 0758   K 3.8 02/22/2015 1034   K 3.3* 09/10/2013 0758   CL 103 02/22/2015 1034   CL 100 09/10/2013 0758   CO2 30 02/22/2015 1034   CO2 32 09/10/2013 0758   BUN 11 02/22/2015 1034   BUN 13 09/10/2013 0758   CREATININE 0.97 02/22/2015 1034   CREATININE 0.96 12/22/2013 0857   CREATININE 0.73 09/10/2013 0758      Component Value Date/Time   CALCIUM 9.0 02/22/2015 1034   CALCIUM 9.2 09/10/2013 0758   ALKPHOS 63 02/14/2015 0726   ALKPHOS 78 09/10/2013 0758    AST 20 02/14/2015 0726   AST 23 09/10/2013 0758   ALT 18 02/14/2015 0726   ALT 28 09/10/2013 0758   BILITOT 0.8 02/14/2015 0726   BILITOT 0.6 09/10/2013 0758       RADIOGRAPHIC STUDIES: I have personally reviewed the radiological images as listed and agreed with the findings in the  report. Nm Pet Image Initial (pi) Skull Base To Thigh  03/11/2015  CLINICAL DATA:  Initial treatment strategy for retroperitoneal adenopathy. EXAM: NUCLEAR MEDICINE PET SKULL BASE TO THIGH TECHNIQUE: 12.4 and mCi F-18 FDG was injected intravenously. Full-ring PET imaging was performed from the skull base to thigh after the radiotracer. CT data was obtained and used for attenuation correction and anatomic localization. FASTING BLOOD GLUCOSE:  Value: 73 mg/dl COMPARISON:  CT 02/19/2015, 02/14/2015 FINDINGS: NECK No hypermetabolic lymph nodes in the neck. CHEST No hypermetabolic mediastinal or hilar nodes. No suspicious pulmonary nodules on the CT scan. ABDOMEN/PELVIS There is enlarged hypermetabolic lymph node within the retroperitoneum LEFT aorta at the level of the LEFT renal vein measuring 2.5 cm with SUV max equal 15.5. There is adjacent 9 mm short axis lymph node just inferior on image 155 with more mild metabolic activity with SUV max equal 4.9. Sub cm lymph nodes LEFT aorta on image 162 likely has metabolic activity. In the pelvis single hypermetabolic LEFT external iliac lymph node measures 6 mm short axis (image 209, series 4) with moderate metabolic activity (SUV max equal 5.0) Again demonstrated large cyst within the LEFT renal pelvis. No abnormal metabolic activity in the spleen. No abnormal metabolic activity in the liver, pancreas, or adrenal glands SKELETON No hypermetabolic foci within the marrow. IMPRESSION: 1. Intensely hypermetabolic large LEFT periaortic retroperitoneal lymph node is most consistent with lymphoma. 2. Several smaller hypermetabolic lymph nodes  LEFT of the aorta. 3. Single hypermetabolic  pelves lymph node at the LEFT external iliac nodal station. This may be most assessable for biopsy although the lymph node is small. 4. No evidence of bone marrow metastasis. Electronically Signed   By: Suzy Bouchard M.D.   On: 03/11/2015 11:08     ASSESSMENT & PLAN:   # RETROPERITONEAL LN [incidental/asymptomatic]- PET scan shows SUV around 15/2.5 cm left periaortic lymph node; with few subcentimeter lymph nodes. This is highly suspicious for lymphoma; however only proven with biopsy.  I reviewed the CT scan done in November 2014 noncontrast- that showed a centimeter sized lymph node in the retroperitoneum/left periaortic area.   # Clinically this appears to be a low-grade lymphoma; however biopsy is absolutely needed to confirm the diagnosis. Also to reassure the patient I did tell them that sometimes we watch and wait low-grade lymphomas. Again no decisions yet; as we are still waiting on the biopsy.  Patient will follow-up with me in approximately 4-5 days after the biopsy to review the next plan Of care.  Orders Placed This Encounter  Procedures  . CT Biopsy    Standing Status: Future     Number of Occurrences:      Standing Expiration Date: 03/11/2016    Order Specific Question:  Lab orders requested (DO NOT place separate lab orders, these will be automatically ordered during procedure specimen collection):    Answer:  Surgical Pathology     Comments:  send for flowcytometry    Order Specific Question:  Reason for Exam (SYMPTOM  OR DIAGNOSIS REQUIRED)    Answer:  enlarged retroperitoneal LN/Left external Iliac LN    Order Specific Question:  Preferred imaging location?    Answer:  [facility]    Order Specific Question:  Call Report- Best Contact Number?    Answer:  call Dr.Brahmanday- at [phone] if questions  . Lactate dehydrogenase    Standing Status: Future     Number of Occurrences:      Standing Expiration Date: 03/11/2016  All questions were answered. The  patient knows to call the clinic with any problems, questions or concerns. No barriers to learning was detected. I spent 25 minutes counseling the patient face to face. The total time spent in the appointment was 30 minutes and more than 50% was on counseling and review of test results     Cammie Sickle, MD 03/12/2015 12:29 PM

## 2015-03-12 NOTE — Progress Notes (Signed)
Pt alert x 3, ambulates with out assistance.  Patient denies pain or discomfort.  Family in room with patient.

## 2015-03-15 ENCOUNTER — Inpatient Hospital Stay: Payer: PPO | Admitting: Internal Medicine

## 2015-03-23 ENCOUNTER — Other Ambulatory Visit: Payer: Self-pay | Admitting: Radiology

## 2015-03-23 ENCOUNTER — Ambulatory Visit
Admission: RE | Admit: 2015-03-23 | Discharge: 2015-03-23 | Disposition: A | Discharge status: Home / Self Care | Payer: PPO | Source: Ambulatory Visit | Attending: Internal Medicine | Admitting: Internal Medicine

## 2015-03-24 ENCOUNTER — Ambulatory Visit
Admission: RE | Admit: 2015-03-24 | Discharge: 2015-03-24 | Disposition: A | Discharge status: Home / Self Care | Payer: PPO | Source: Ambulatory Visit | Attending: Internal Medicine | Admitting: Internal Medicine

## 2015-03-24 DIAGNOSIS — C8218 Follicular lymphoma grade II, lymph nodes of multiple sites: Secondary | ICD-10-CM

## 2015-03-24 DIAGNOSIS — Z01812 Encounter for preprocedural laboratory examination: Secondary | ICD-10-CM | POA: Insufficient documentation

## 2015-03-24 DIAGNOSIS — R590 Localized enlarged lymph nodes: Secondary | ICD-10-CM | POA: Diagnosis present

## 2015-03-24 DIAGNOSIS — C8290 Follicular lymphoma, unspecified, unspecified site: Secondary | ICD-10-CM | POA: Diagnosis not present

## 2015-03-24 LAB — CBC
HCT: 38.8 % (ref 35.0–47.0)
Hemoglobin: 13.4 g/dL (ref 12.0–16.0)
MCH: 32.5 pg (ref 26.0–34.0)
MCHC: 34.5 g/dL (ref 32.0–36.0)
MCV: 94.1 fL (ref 80.0–100.0)
Platelets: 211 10*3/uL (ref 150–440)
RBC: 4.12 MIL/uL (ref 3.80–5.20)
RDW: 13.1 % (ref 11.5–14.5)
WBC: 7.2 10*3/uL (ref 3.6–11.0)

## 2015-03-24 LAB — APTT: aPTT: 27 seconds (ref 24–36)

## 2015-03-24 LAB — PROTIME-INR
INR: 1.02
Prothrombin Time: 13.6 seconds (ref 11.4–15.0)

## 2015-03-24 MED ORDER — SODIUM CHLORIDE 0.9 % IV SOLN
Freq: Once | INTRAVENOUS | Status: AC
  Administered 2015-03-24: 09:00:00 via INTRAVENOUS

## 2015-03-24 MED ORDER — MIDAZOLAM HCL 5 MG/5ML IJ SOLN
INTRAMUSCULAR | Status: AC | PRN
  Administered 2015-03-24 (×2): 1 mg via INTRAVENOUS

## 2015-03-24 MED ORDER — FENTANYL CITRATE (PF) 100 MCG/2ML IJ SOLN
INTRAMUSCULAR | Status: AC | PRN
  Administered 2015-03-24: 50 ug via INTRAVENOUS

## 2015-03-24 NOTE — Procedures (Signed)
Interventional Radiology Procedure Note  Procedure: Successful CT guided bx of left retroperitoneal adenopathy  Complications: None  Estimated Blood Loss: 0  Recommendations: - Bedrest x 1hr - DC home  Signed,  Criselda Peaches, MD

## 2015-03-24 NOTE — Discharge Instructions (Signed)
Take dressing off biopsy site tonight put band aid on you may wash around the site but do not shower until tomorrow.  Leave band aid on for 2 days

## 2015-03-26 ENCOUNTER — Inpatient Hospital Stay: Payer: PPO

## 2015-03-26 ENCOUNTER — Telehealth: Payer: Self-pay | Admitting: *Deleted

## 2015-03-26 ENCOUNTER — Inpatient Hospital Stay: Payer: PPO | Admitting: Internal Medicine

## 2015-03-26 NOTE — Telephone Encounter (Signed)
Left msg on patient's home phone and cell phone. Need to r/s her appointment to next Thursday 04/01/15 at 3pm for labs and 330pm for md. Results are not yet back.

## 2015-04-01 ENCOUNTER — Inpatient Hospital Stay (HOSPITAL_BASED_OUTPATIENT_CLINIC_OR_DEPARTMENT_OTHER): Payer: PPO | Admitting: Internal Medicine

## 2015-04-01 ENCOUNTER — Inpatient Hospital Stay: Payer: PPO | Attending: Internal Medicine

## 2015-04-01 VITALS — BP 139/72 | HR 76 | Temp 97.0°F | Wt 145.5 lb

## 2015-04-01 DIAGNOSIS — C8218 Follicular lymphoma grade II, lymph nodes of multiple sites: Secondary | ICD-10-CM

## 2015-04-01 DIAGNOSIS — Z8052 Family history of malignant neoplasm of bladder: Secondary | ICD-10-CM

## 2015-04-01 DIAGNOSIS — Z8589 Personal history of malignant neoplasm of other organs and systems: Secondary | ICD-10-CM | POA: Insufficient documentation

## 2015-04-01 DIAGNOSIS — E785 Hyperlipidemia, unspecified: Secondary | ICD-10-CM | POA: Insufficient documentation

## 2015-04-01 DIAGNOSIS — Z86010 Personal history of colonic polyps: Secondary | ICD-10-CM | POA: Diagnosis not present

## 2015-04-01 DIAGNOSIS — B029 Zoster without complications: Secondary | ICD-10-CM

## 2015-04-01 DIAGNOSIS — C8210 Follicular lymphoma grade II, unspecified site: Secondary | ICD-10-CM | POA: Insufficient documentation

## 2015-04-01 DIAGNOSIS — Z803 Family history of malignant neoplasm of breast: Secondary | ICD-10-CM | POA: Diagnosis not present

## 2015-04-01 DIAGNOSIS — I10 Essential (primary) hypertension: Secondary | ICD-10-CM | POA: Diagnosis not present

## 2015-04-01 DIAGNOSIS — Z79899 Other long term (current) drug therapy: Secondary | ICD-10-CM | POA: Diagnosis not present

## 2015-04-01 DIAGNOSIS — R011 Cardiac murmur, unspecified: Secondary | ICD-10-CM | POA: Diagnosis not present

## 2015-04-01 DIAGNOSIS — K649 Unspecified hemorrhoids: Secondary | ICD-10-CM | POA: Diagnosis not present

## 2015-04-01 DIAGNOSIS — K219 Gastro-esophageal reflux disease without esophagitis: Secondary | ICD-10-CM | POA: Diagnosis not present

## 2015-04-01 DIAGNOSIS — Z87442 Personal history of urinary calculi: Secondary | ICD-10-CM | POA: Diagnosis not present

## 2015-04-01 LAB — LACTATE DEHYDROGENASE: LDH: 160 U/L (ref 98–192)

## 2015-04-01 LAB — SURGICAL PATHOLOGY

## 2015-04-01 NOTE — Progress Notes (Signed)
[facility] OFFICE PROGRESS NOTE  Patient Care Team: [name], MD as PCP - General (Internal Medicine) Robert Bellow, MD (General Surgery)   SUMMARY OF ONCOLOGIC HISTORY:  # NOV 3810- FOLLICULAR LYMPHOMA G-2; [s/p Bx- RETROPERITONEAL/LEFT Peri-aortic LN ~ 2.5CM [incidental on CT scan in 2016; CT- Nov 2014- ~1CM]/ PET 2016-Left Peri-aortic LN; Suv ~15; Ext Iliac LN 6 mm;   INTERVAL HISTORY: A very pleasant 74 year old female patient with above history of incidental finding of retroperitoneal adenopathy is here to review the results of her biopsy.  Patient continues to have has intermittent pain on the abdominal wall from her recent shingles. Continues to deny any weight loss or any night sweats or fevers.   REVIEW OF SYSTEMS:  A complete 10 point review of system is done which is negative except mentioned above/history of present illness.   PAST MEDICAL HISTORY :  Past Medical History  Diagnosis Date  . Bronchitis   . Hypertension   . Hyperlipidemia   . Hemorrhoids   . Heart murmur   . GERD (gastroesophageal reflux disease)   . History of kidney stones   . History of colon polyps   . 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 Basal cell carcinoma of nose   . History of mammogram 2016    PAST SURGICAL HISTORY :   Past Surgical History  Procedure Laterality Date  . Tonsillectomy  1971  . Tubal ligation  1975  . Abdominal hysterectomy  1984  . Vaginal prolapse repair  July 2001    Pelvic Prolapse  . Cholecystectomy  06-01-03  . Lithotripsy  2005  . Bladder suspension  2010  . Local exicsion skin cancer  2004    squamous cell of chest wall. left chest  . Colonoscopy  2003    FAMILY HISTORY :   Family History  Problem Relation Age of Onset  . Cancer Mother     breast   . Stroke Mother   . Stroke Father   . Cancer Brother     bladder  . Stroke Brother     SOCIAL HISTORY:   Social History  Substance  Use Topics  . Smoking status: Never Smoker   . Smokeless tobacco: Never Used  . Alcohol Use: No    ALLERGIES:  has No Known Allergies.  MEDICATIONS:  Current Outpatient Prescriptions  Medication Sig Dispense Refill  . atorvastatin (LIPITOR) 20 MG tablet Take 2 tablets (40 mg total) by mouth at bedtime. 180 tablet 1  . escitalopram (LEXAPRO) 10 MG tablet TAKE ONE (1) TABLET BY MOUTH EVERY DAY 90 tablet 4  . hyoscyamine (LEVBID) 0.375 MG 12 hr tablet Take 1 tablet (0.375 mg total) by mouth daily. 90 tablet 1  . losartan (COZAAR) 100 MG tablet Take 1 tablet (100 mg total) by mouth daily. 90 tablet 3  . metoprolol succinate (TOPROL-XL) 25 MG 24 hr tablet Take 1 tablet (25 mg total) by mouth daily. 90 tablet 3  . potassium chloride SA (K-DUR,KLOR-CON) 20 MEQ tablet Take 1 tablet (20 mEq total) by mouth daily. 30 tablet 3  . Spacer/Aero-Holding Chambers (AEROCHAMBER MV) inhaler Use as instructed 1 each 0  . triamterene-hydrochlorothiazide (MAXZIDE-25) 37.5-25 MG per tablet Take 0.5 tablets by mouth daily. 90 tablet 3  . valACYclovir (VALTREX) 1000 MG tablet Take 1 tablet (1,000 mg total) by mouth 2 (two) times daily. 14 tablet 0  . zoster vaccine live, PF, (  ZOSTAVAX) 93810 UNT/0.65ML injection Inject 19,400 Units into the skin once. 1 each 0   No current facility-administered medications for this visit.    PHYSICAL EXAMINATION: ECOG PERFORMANCE STATUS: 0 - Asymptomatic  There were no vitals taken for this visit.  There were no vitals filed for this visit.  GENERAL: Well-nourished well-developed; Alert, no distress and comfortable.  Accompanied by husband.  LABORATORY DATA:  I have reviewed the data as listed    Component Value Date/Time   NA 139 02/22/2015 1034   NA 138 09/10/2013 0758   K 3.8 02/22/2015 1034   K 3.3* 09/10/2013 0758   CL 103 02/22/2015 1034   CL 100 09/10/2013 0758   CO2 30 02/22/2015 1034   CO2 32 09/10/2013 0758   GLUCOSE 74 02/22/2015 1034   GLUCOSE 85  09/10/2013 0758   BUN 11 02/22/2015 1034   BUN 13 09/10/2013 0758   CREATININE 0.97 02/22/2015 1034   CREATININE 0.96 12/22/2013 0857   CREATININE 0.73 09/10/2013 0758   CALCIUM 9.0 02/22/2015 1034   CALCIUM 9.2 09/10/2013 0758   PROT 7.5 02/14/2015 0726   PROT 8.3* 09/10/2013 0758   ALBUMIN 4.0 02/14/2015 0726   ALBUMIN 3.5 09/10/2013 0758   AST 20 02/14/2015 0726   AST 23 09/10/2013 0758   ALT 18 02/14/2015 0726   ALT 28 09/10/2013 0758   ALKPHOS 63 02/14/2015 0726   ALKPHOS 78 09/10/2013 0758   BILITOT 0.8 02/14/2015 0726   BILITOT 0.6 09/10/2013 0758   GFRNONAA 48* 02/14/2015 0726   GFRNONAA 59* 12/22/2013 0857   GFRNONAA >60 09/10/2013 0758   GFRAA 56* 02/14/2015 0726   GFRAA 68 12/22/2013 0857   GFRAA >60 09/10/2013 0758    No results found for: SPEP, UPEP  Lab Results  Component Value Date   WBC 7.2 03/24/2015   NEUTROABS 5.5 02/15/2015   HGB 13.4 03/24/2015   HCT 38.8 03/24/2015   MCV 94.1 03/24/2015   PLT 211 03/24/2015      Chemistry      Component Value Date/Time   NA 139 02/22/2015 1034   NA 138 09/10/2013 0758   K 3.8 02/22/2015 1034   K 3.3* 09/10/2013 0758   CL 103 02/22/2015 1034   CL 100 09/10/2013 0758   CO2 30 02/22/2015 1034   CO2 32 09/10/2013 0758   BUN 11 02/22/2015 1034   BUN 13 09/10/2013 0758   CREATININE 0.97 02/22/2015 1034   CREATININE 0.96 12/22/2013 0857   CREATININE 0.73 09/10/2013 0758      Component Value Date/Time   CALCIUM 9.0 02/22/2015 1034   CALCIUM 9.2 09/10/2013 0758   ALKPHOS 63 02/14/2015 0726   ALKPHOS 78 09/10/2013 0758   AST 20 02/14/2015 0726   AST 23 09/10/2013 0758   ALT 18 02/14/2015 0726   ALT 28 09/10/2013 0758   BILITOT 0.8 02/14/2015 0726   BILITOT 0.6 09/10/2013 0758       RADIOGRAPHIC STUDIES: I have personally reviewed the radiological images as listed and agreed with the findings in the report. No results found.   ASSESSMENT & PLAN:  # RETROPERITONEAL LN [incidental/asymptomatic]-  follicular lymphoma grade 2; likely stage II [bone marrow biopsy not done]. I discussed the role of bone marrow biopsy- however since her CBC is within normal limits; I think it's reasonable to hold off bone marrow biopsy at this time.   Again the treatment options including #1 radiation #2 use of systemic therapy like Rituxan plus minus chemotherapy bendamustine/CVP etc. #  3- observation/surveillance. Discussed the pros and cons of each modality; radiation might put her risk of side effects like diarrhea. However, chemotherapy can put her risk of shingles- which she is recently recovering from.  # I would defer to Dr. Donella Stade for further evaluation and recommendations. She'll follow-up with me in approximately 4 weeks/or sooner based upon the visit with Dr. Donella Stade.    All questions were answered. The patient knows to call the clinic with any problems, questions or concerns. No barriers to learning was detected.  I spent 25 minutes counseling the patient face to face. The total time spent in the appointment was 30 minutes and more than 50% was on counseling and review of test results     Cammie Sickle, MD 04/01/2015 3:15 PM

## 2015-04-01 NOTE — Progress Notes (Signed)
Patient brought to exam room 4, ambulates independently w/o assistance.  Patient denies pain or discomfort.

## 2015-04-02 ENCOUNTER — Encounter: Payer: Self-pay | Admitting: Internal Medicine

## 2015-04-05 ENCOUNTER — Ambulatory Visit
Admission: RE | Admit: 2015-04-05 | Discharge: 2015-04-05 | Disposition: A | Discharge status: Home / Self Care | Payer: PPO | Source: Ambulatory Visit | Attending: Radiation Oncology | Admitting: Radiation Oncology

## 2015-04-05 ENCOUNTER — Encounter: Payer: Self-pay | Admitting: Radiation Oncology

## 2015-04-05 VITALS — BP 146/83 | HR 74 | Temp 97.4°F | Resp 18 | Wt 146.2 lb

## 2015-04-05 DIAGNOSIS — C8290 Follicular lymphoma, unspecified, unspecified site: Secondary | ICD-10-CM

## 2015-04-05 NOTE — Consult Note (Signed)
Except an outstanding is perfect of Radiation Oncology NEW PATIENT EVALUATION  Name: Whitney Molina  MRN: [id number]  Date:   04/05/2015     DOB: 05-22-1940   This 74 y.o. female patient presents to the clinic for initial evaluation of B-cell follicular lymphoma.  REFERRING PHYSICIAN: [name], MD  CHIEF COMPLAINT:  Chief Complaint  Patient presents with  . Cancer    Initial consult regarding radiation treatment for Folicular Lymphoma    DIAGNOSIS: The encounter diagnosis was Follicular lymphoma, unspecified body region, unspecified follicular lymphoma type ([place]).   PREVIOUS INVESTIGATIONS:  PET CT scans and CT scans reviewed Clinical notes reviewed Surgical pathology report reviewed  HPI: Patient is a pleasant 74 year old female who presented with questionable abdominal pain possibly renal stone was found to have an enlarged retroperitoneal lymph node. She's recently all had also had a bout of herpes zoster effecting her abdominal wall. PET CT scan demonstrated hypermetabolic nodes in the pelvis as well as enlarged lymph node in the retroperitoneum. She underwent biopsy of her enlarged retroperitoneal lymph node which was positive for B-cell follicular lymphoma [id number] positive. This is most consistent with grade 2 disease. She's been seen by medical oncology and treatment options including observation to chemotherapy and Rituxan as well as radiation therapy were all discussed. She seen today for radiation oncology opinion. She is doing well she does have some back pain but this is mostly associated with chronic condition. She denies fever sweats chills or night sweats.  PLANNED TREATMENT REGIMEN: Observation this time with possible Rituxan  PAST MEDICAL HISTORY:  has a past medical history of Bronchitis; Hypertension; Hyperlipidemia; Hemorrhoids; Heart murmur; GERD (gastroesophageal reflux disease); History of kidney stones; History of colon polyps; Primary squamous cell  carcinoma of chest wall ([place]) (2004); Basal cell carcinoma of nose; and History of mammogram (2016).    PAST SURGICAL HISTORY:  Past Surgical History  Procedure Laterality Date  . Tonsillectomy  1971  . Tubal ligation  1975  . Abdominal hysterectomy  1984  . Vaginal prolapse repair  July 2001    Pelvic Prolapse  . Cholecystectomy  06-01-03  . Lithotripsy  2005  . Bladder suspension  2010  . Local exicsion skin cancer  2004    squamous cell of chest wall. left chest  . Colonoscopy  2003    FAMILY HISTORY: family history includes Cancer in her brother and mother; Stroke in her brother, father, and mother.  SOCIAL HISTORY:  reports that she has never smoked. She has never used smokeless tobacco. She reports that she does not drink alcohol or use illicit drugs.  ALLERGIES: Review of patient's allergies indicates no known allergies.  MEDICATIONS:  Current Outpatient Prescriptions  Medication Sig Dispense Refill  . atorvastatin (LIPITOR) 20 MG tablet Take 2 tablets (40 mg total) by mouth at bedtime. 180 tablet 1  . escitalopram (LEXAPRO) 10 MG tablet TAKE ONE (1) TABLET BY MOUTH EVERY DAY 90 tablet 4  . hyoscyamine (LEVBID) 0.375 MG 12 hr tablet Take 1 tablet (0.375 mg total) by mouth daily. 90 tablet 1  . losartan (COZAAR) 100 MG tablet Take 1 tablet (100 mg total) by mouth daily. 90 tablet 3  . metoprolol succinate (TOPROL-XL) 25 MG 24 hr tablet Take 1 tablet (25 mg total) by mouth daily. 90 tablet 3  . potassium chloride SA (K-DUR,KLOR-CON) 20 MEQ tablet Take 1 tablet (20 mEq total) by mouth daily. 30 tablet 3  . Spacer/Aero-Holding Chambers (AEROCHAMBER MV) inhaler Use as instructed  1 each 0  . triamterene-hydrochlorothiazide (MAXZIDE-25) 37.5-25 MG per tablet Take 0.5 tablets by mouth daily. 90 tablet 3  . zoster vaccine live, PF, (ZOSTAVAX) 16109 UNT/0.65ML injection Inject 19,400 Units into the skin once. 1 each 0   No current facility-administered medications for this encounter.     ECOG PERFORMANCE STATUS:  0 - Asymptomatic  REVIEW OF SYSTEMS:  Patient denies any weight loss, fatigue, weakness, fever, chills or night sweats. Patient denies any loss of vision, blurred vision. Patient denies any ringing  of the ears or hearing loss. No irregular heartbeat. Patient denies heart murmur or history of fainting. Patient denies any chest pain or pain radiating to her upper extremities. Patient denies any shortness of breath, difficulty breathing at night, cough or hemoptysis. Patient denies any swelling in the lower legs. Patient denies any nausea vomiting, vomiting of blood, or coffee ground material in the vomitus. Patient denies any stomach pain. Patient states has had normal bowel movements no significant constipation or diarrhea. Patient denies any dysuria, hematuria or significant nocturia. Patient denies any problems walking, swelling in the joints or loss of balance. Patient denies any skin changes, loss of hair or loss of weight. Patient denies any excessive worrying or anxiety or significant depression. Patient denies any problems with insomnia. Patient denies excessive thirst, polyuria, polydipsia. Patient denies any swollen glands, patient denies easy bruising or easy bleeding. Patient denies any recent infections, allergies or URI. Patient "s visual fields have not changed significantly in recent time.    PHYSICAL EXAM: BP 146/83 mmHg  Pulse 74  Temp(Src) 97.4 F (36.3 C)  Resp 18  Wt 146 lb 2.6 oz (66.3 kg) No peripheral adenopathy identified. No evidence of cervical supra clavicular axillary or inguinal adenopathy is noted. Well-developed well-nourished patient in NAD. HEENT reveals PERLA, EOMI, discs not visualized.  Oral cavity is clear. No oral mucosal lesions are identified. Neck is clear without evidence of cervical or supraclavicular adenopathy. Lungs are clear to A&P. Cardiac examination is essentially unremarkable with regular rate and rhythm without murmur  rub or thrill. Abdomen is benign with no organomegaly or masses noted. Motor sensory and DTR levels are equal and symmetric in the upper and lower extremities. Cranial nerves II through XII are grossly intact. Proprioception is intact. No peripheral adenopathy or edema is identified. No motor or sensory levels are noted. Crude visual fields are within normal range.  LABORATORY DATA: Surgical pathology report is reviewed    RADIOLOGY RESULTS: PET CT scan and CT scans reviewed   IMPRESSION: Infradiaphragmatic stage II follicular lymphoma B cell type CDXX positive asymptomatic in 74 year old female  PLAN: At this time I favor either observation or going ahead with Rituxan. Do not believe radiation therapy office any significant benefit at this point in time since she is asymptomatic. Possibly down the road of his Zevalin treatment was also discussed with the patient and her husband. I will discuss the case personally with medical oncology. She or he has follow-up appointments with him. Could always use radiation therapy to treat any lesion that becomes symptomatic without significant morbidity. Again all of this was discussed with patient she will continue follow-up care with medical oncology.  I would like to take this opportunity for allowing me to participate in the care of your patient.Armstead Peaks., MD

## 2015-04-21 ENCOUNTER — Ambulatory Visit (INDEPENDENT_AMBULATORY_CARE_PROVIDER_SITE_OTHER): Payer: PPO

## 2015-04-21 DIAGNOSIS — Z23 Encounter for immunization: Secondary | ICD-10-CM

## 2015-04-30 ENCOUNTER — Telehealth: Payer: Self-pay | Admitting: Internal Medicine

## 2015-04-30 NOTE — Telephone Encounter (Signed)
Left msg for pt to call the office to schedule AWV/msn °

## 2015-05-03 ENCOUNTER — Inpatient Hospital Stay: Payer: PPO | Attending: Internal Medicine | Admitting: Internal Medicine

## 2015-05-03 VITALS — BP 149/79 | HR 60 | Temp 97.8°F | Resp 18 | Ht 64.0 in | Wt 145.5 lb

## 2015-05-03 DIAGNOSIS — Z85828 Personal history of other malignant neoplasm of skin: Secondary | ICD-10-CM | POA: Insufficient documentation

## 2015-05-03 DIAGNOSIS — Z87442 Personal history of urinary calculi: Secondary | ICD-10-CM | POA: Insufficient documentation

## 2015-05-03 DIAGNOSIS — K219 Gastro-esophageal reflux disease without esophagitis: Secondary | ICD-10-CM | POA: Insufficient documentation

## 2015-05-03 DIAGNOSIS — E785 Hyperlipidemia, unspecified: Secondary | ICD-10-CM

## 2015-05-03 DIAGNOSIS — Z86010 Personal history of colonic polyps: Secondary | ICD-10-CM | POA: Insufficient documentation

## 2015-05-03 DIAGNOSIS — Z8589 Personal history of malignant neoplasm of other organs and systems: Secondary | ICD-10-CM | POA: Insufficient documentation

## 2015-05-03 DIAGNOSIS — Z8052 Family history of malignant neoplasm of bladder: Secondary | ICD-10-CM | POA: Insufficient documentation

## 2015-05-03 DIAGNOSIS — C8218 Follicular lymphoma grade II, lymph nodes of multiple sites: Secondary | ICD-10-CM

## 2015-05-03 DIAGNOSIS — Z803 Family history of malignant neoplasm of breast: Secondary | ICD-10-CM | POA: Diagnosis not present

## 2015-05-03 DIAGNOSIS — C8210 Follicular lymphoma grade II, unspecified site: Secondary | ICD-10-CM | POA: Diagnosis not present

## 2015-05-03 DIAGNOSIS — Z79899 Other long term (current) drug therapy: Secondary | ICD-10-CM | POA: Insufficient documentation

## 2015-05-03 DIAGNOSIS — Z872 Personal history of diseases of the skin and subcutaneous tissue: Secondary | ICD-10-CM | POA: Diagnosis not present

## 2015-05-03 DIAGNOSIS — I10 Essential (primary) hypertension: Secondary | ICD-10-CM | POA: Diagnosis not present

## 2015-05-03 NOTE — Patient Instructions (Signed)
Rituximab injection What is this medicine? RITUXIMAB (ri TUX i mab) is a monoclonal antibody. It is used commonly to treat non-Hodgkin lymphoma and other conditions. It is also used to treat rheumatoid arthritis (RA). In RA, this medicine slows the inflammatory process and help reduce joint pain and swelling. This medicine is often used with other cancer or arthritis medications. This medicine may be used for other purposes; ask your health care provider or pharmacist if you have questions. What should I tell my health care provider before I take this medicine? They need to know if you have any of these conditions: -blood disorders -heart disease -history of hepatitis B -infection (especially a virus infection such as chickenpox, cold sores, or herpes) -irregular heartbeat -kidney disease -lung or breathing disease, like asthma -lupus -an unusual or allergic reaction to rituximab, mouse proteins, other medicines, foods, dyes, or preservatives -pregnant or trying to get pregnant -breast-feeding How should I use this medicine? This medicine is for infusion into a vein. It is administered in a hospital or clinic by a specially trained health care professional. A special MedGuide will be given to you by the pharmacist with each prescription and refill. Be sure to read this information carefully each time. Talk to your pediatrician regarding the use of this medicine in children. This medicine is not approved for use in children. Overdosage: If you think you have taken too much of this medicine contact a poison control center or emergency room at once. NOTE: This medicine is only for you. Do not share this medicine with others. What if I miss a dose? It is important not to miss a dose. Call your doctor or health care professional if you are unable to keep an appointment. What may interact with this medicine? -cisplatin -medicines for blood pressure -some other medicines for  arthritis -vaccines This list may not describe all possible interactions. Give your health care provider a list of all the medicines, herbs, non-prescription drugs, or dietary supplements you use. Also tell them if you smoke, drink alcohol, or use illegal drugs. Some items may interact with your medicine. What should I watch for while using this medicine? Report any side effects that you notice during your treatment right away, such as changes in your breathing, fever, chills, dizziness or lightheadedness. These effects are more common with the first dose. Visit your prescriber or health care professional for checks on your progress. You will need to have regular blood work. Report any other side effects. The side effects of this medicine can continue after you finish your treatment. Continue your course of treatment even though you feel ill unless your doctor tells you to stop. Call your doctor or health care professional for advice if you get a fever, chills or sore throat, or other symptoms of a cold or flu. Do not treat yourself. This drug decreases your body's ability to fight infections. Try to avoid being around people who are sick. This medicine may increase your risk to bruise or bleed. Call your doctor or health care professional if you notice any unusual bleeding. Be careful brushing and flossing your teeth or using a toothpick because you may get an infection or bleed more easily. If you have any dental work done, tell your dentist you are receiving this medicine. Avoid taking products that contain aspirin, acetaminophen, ibuprofen, naproxen, or ketoprofen unless instructed by your doctor. These medicines may hide a fever. Do not become pregnant while taking this medicine. Women should inform their doctor if   they wish to become pregnant or think they might be pregnant. There is a potential for serious side effects to an unborn child. Talk to your health care professional or pharmacist for more  information. Do not breast-feed an infant while taking this medicine. What side effects may I notice from receiving this medicine? Side effects that you should report to your doctor or health care professional as soon as possible: -allergic reactions like skin rash, itching or hives, swelling of the face, lips, or tongue -low blood counts - this medicine may decrease the number of white blood cells, red blood cells and platelets. You may be at increased risk for infections and bleeding. -signs of infection - fever or chills, cough, sore throat, pain or difficulty passing urine -signs of decreased platelets or bleeding - bruising, pinpoint red spots on the skin, black, tarry stools, blood in the urine -signs of decreased red blood cells - unusually weak or tired, fainting spells, lightheadedness -breathing problems -confused, not responsive -chest pain -fast, irregular heartbeat -feeling faint or lightheaded, falls -mouth sores -redness, blistering, peeling or loosening of the skin, including inside the mouth -stomach pain -swelling of the ankles, feet, or hands -trouble passing urine or change in the amount of urine Side effects that usually do not require medical attention (report to your doctor or other health care professional if they continue or are bothersome): -anxiety -headache -loss of appetite -muscle aches -nausea -night sweats This list may not describe all possible side effects. Call your doctor for medical advice about side effects. You may report side effects to FDA at 1-800-FDA-1088. Where should I keep my medicine? This drug is given in a hospital or clinic and will not be stored at home. NOTE: This sheet is a summary. It may not cover all possible information. If you have questions about this medicine, talk to your doctor, pharmacist, or health care provider.    2016, [employer]vier/Gold Standard. (2014-07-08 22:30:56)  

## 2015-05-03 NOTE — Progress Notes (Signed)
[place] OFFICE PROGRESS NOTE  Patient Care Team: [name], MD as PCP - General (Internal Medicine) Robert Bellow, MD (General Surgery)   SUMMARY OF ONCOLOGIC HISTORY:  # NOV 0272- FOLLICULAR LYMPHOMA G-2; [s/p Bx- RETROPERITONEAL/LEFT Peri-aortic LN ~ 2.5CM [incidental on CT scan in 2016; CT- Nov 2014- ~1CM]/ PET 2016-Left Peri-aortic LN; Suv ~15; Ext Iliac LN 6 mm; Surveillance  # Hx of Shingles [Sep 2016]  INTERVAL HISTORY: A very pleasant 74 year old female patient with above history of incidental finding of retroperitoneal adenopathy is here after her recent evaluation with radiation oncology.   Patient denies any worsening abdominal pain. Continues to deny any weight loss or any night sweats or fevers.   REVIEW OF SYSTEMS:  A complete 10 point review of system is done which is negative except mentioned above/history of present illness.   PAST MEDICAL HISTORY :  Past Medical History  Diagnosis Date  . Bronchitis   . Hypertension   . Hyperlipidemia   . Hemorrhoids   . Heart murmur   . GERD (gastroesophageal reflux disease)   . History of kidney stones   . History of colon polyps   . 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 Basal cell carcinoma of nose   . History of mammogram 2016    PAST SURGICAL HISTORY :   Past Surgical History  Procedure Laterality Date  . Tonsillectomy  1971  . Tubal ligation  1975  . Abdominal hysterectomy  1984  . Vaginal prolapse repair  July 2001    Pelvic Prolapse  . Cholecystectomy  06-01-03  . Lithotripsy  2005  . Bladder suspension  2010  . Local exicsion skin cancer  2004    squamous cell of chest wall. left chest  . Colonoscopy  2003    FAMILY HISTORY :   Family History  Problem Relation Age of Onset  . Cancer Mother     breast   . Stroke Mother   . Stroke Father   . Cancer Brother     bladder  . Stroke Brother     SOCIAL HISTORY:   Social History   Substance Use Topics  . Smoking status: Never Smoker   . Smokeless tobacco: Never Used  . Alcohol Use: No    ALLERGIES:  has No Known Allergies.  MEDICATIONS:  Current Outpatient Prescriptions  Medication Sig Dispense Refill  . atorvastatin (LIPITOR) 20 MG tablet Take 20 mg by mouth daily.    Marland Kitchen escitalopram (LEXAPRO) 10 MG tablet TAKE ONE (1) TABLET BY MOUTH EVERY DAY 90 tablet 4  . hyoscyamine (LEVBID) 0.375 MG 12 hr tablet Take 1 tablet (0.375 mg total) by mouth daily. 90 tablet 1  . losartan (COZAAR) 100 MG tablet Take 1 tablet (100 mg total) by mouth daily. 90 tablet 3  . metoprolol succinate (TOPROL-XL) 25 MG 24 hr tablet Take 1 tablet (25 mg total) by mouth daily. 90 tablet 3  . potassium chloride SA (K-DUR,KLOR-CON) 20 MEQ tablet Take 1 tablet (20 mEq total) by mouth daily. 30 tablet 3  . Probiotic Product (PROBIOTIC PO) Take 1 capsule by mouth daily.    Marland Kitchen triamterene-hydrochlorothiazide (MAXZIDE-25) 37.5-25 MG per tablet Take 0.5 tablets by mouth daily. 90 tablet 3  . zoster vaccine live, PF, (ZOSTAVAX) 53664 UNT/0.65ML injection Inject 19,400 Units into the skin once. 1 each 0  . Spacer/Aero-Holding Chambers (AEROCHAMBER MV) inhaler Use as instructed (Patient not taking:  Reported on 05/03/2015) 1 each 0   No current facility-administered medications for this visit.    PHYSICAL EXAMINATION: ECOG PERFORMANCE STATUS: 0 - Asymptomatic  BP 149/79 mmHg  Pulse 60  Temp(Src) 97.8 F (36.6 C) (Tympanic)  Resp 18  Ht _0  (1.626 m)  Wt 145 lb 8.1 oz (66 kg)  BMI 24.96 kg/m2  Filed Weights   05/03/15 1400  Weight: 145 lb 8.1 oz (66 kg)    GENERAL: Well-nourished well-developed; Alert, no distress and comfortable.  Accompanied by husband.  LABORATORY DATA:  I have reviewed the data as listed    Component Value Date/Time   NA 139 02/22/2015 1034   NA 138 09/10/2013 0758   K 3.8 02/22/2015 1034   K 3.3* 09/10/2013 0758   CL 103 02/22/2015 1034   CL 100 09/10/2013  0758   CO2 30 02/22/2015 1034   CO2 32 09/10/2013 0758   GLUCOSE 74 02/22/2015 1034   GLUCOSE 85 09/10/2013 0758   BUN 11 02/22/2015 1034   BUN 13 09/10/2013 0758   CREATININE 0.97 02/22/2015 1034   CREATININE 0.96 12/22/2013 0857   CREATININE 0.73 09/10/2013 0758   CALCIUM 9.0 02/22/2015 1034   CALCIUM 9.2 09/10/2013 0758   PROT 7.5 02/14/2015 0726   PROT 8.3* 09/10/2013 0758   ALBUMIN 4.0 02/14/2015 0726   ALBUMIN 3.5 09/10/2013 0758   AST 20 02/14/2015 0726   AST 23 09/10/2013 0758   ALT 18 02/14/2015 0726   ALT 28 09/10/2013 0758   ALKPHOS 63 02/14/2015 0726   ALKPHOS 78 09/10/2013 0758   BILITOT 0.8 02/14/2015 0726   BILITOT 0.6 09/10/2013 0758   GFRNONAA 48* 02/14/2015 0726   GFRNONAA 59* 12/22/2013 0857   GFRNONAA >60 09/10/2013 0758   GFRAA 56* 02/14/2015 0726   GFRAA 68 12/22/2013 0857   GFRAA >60 09/10/2013 0758    No results found for: SPEP, UPEP  Lab Results  Component Value Date   WBC 7.2 03/24/2015   NEUTROABS 5.5 02/15/2015   HGB 13.4 03/24/2015   HCT 38.8 03/24/2015   MCV 94.1 03/24/2015   PLT 211 03/24/2015      Chemistry      Component Value Date/Time   NA 139 02/22/2015 1034   NA 138 09/10/2013 0758   K 3.8 02/22/2015 1034   K 3.3* 09/10/2013 0758   CL 103 02/22/2015 1034   CL 100 09/10/2013 0758   CO2 30 02/22/2015 1034   CO2 32 09/10/2013 0758   BUN 11 02/22/2015 1034   BUN 13 09/10/2013 0758   CREATININE 0.97 02/22/2015 1034   CREATININE 0.96 12/22/2013 0857   CREATININE 0.73 09/10/2013 0758      Component Value Date/Time   CALCIUM 9.0 02/22/2015 1034   CALCIUM 9.2 09/10/2013 0758   ALKPHOS 63 02/14/2015 0726   ALKPHOS 78 09/10/2013 0758   AST 20 02/14/2015 0726   AST 23 09/10/2013 0758   ALT 18 02/14/2015 0726   ALT 28 09/10/2013 0758   BILITOT 0.8 02/14/2015 0726   BILITOT 0.6 09/10/2013 0758       RADIOGRAPHIC STUDIES: I have personally reviewed the radiological images as listed and agreed with the findings in the  report. No results found.   ASSESSMENT & PLAN:  # RETROPERITONEAL LN [incidental/asymptomatic]- follicular lymphoma grade 2; likely stage II [bone marrow biopsy not done]. Patient is currently asymptomatic. We'll plan to get a PET scan in approximately 2 months from now/4 months from her previous PET scan.  Based upon the results of the scan- plan treatment. In specific discussed the treatment options with single agent Rituxan IV every week 4. Discussed the potential infusion reactions; issues with infections. Given her history of prior shingles; I would recommend shingles prophylaxis while getting rituximab. Also reviewed the options of chemotherapy immunotherapy- which I think would be too aggressive for her low-grade lymphoma at this time.   # 25 minutes face-to-face with the patient discussing the above plan of care; more than 50% of time spent on prognosis/ natural history; counseling and coordination.     Cammie Sickle, MD 05/03/2015 2:19 PM

## 2015-05-19 DIAGNOSIS — J301 Allergic rhinitis due to pollen: Secondary | ICD-10-CM | POA: Diagnosis not present

## 2015-05-20 ENCOUNTER — Ambulatory Visit (INDEPENDENT_AMBULATORY_CARE_PROVIDER_SITE_OTHER): Payer: Medicare Other

## 2015-05-20 VITALS — BP 130/80 | HR 76 | Temp 97.1°F | Resp 12 | Ht 63.0 in | Wt 146.0 lb

## 2015-05-20 DIAGNOSIS — Z0000 Encounter for general adult medical examination without abnormal findings: Secondary | ICD-10-CM | POA: Diagnosis not present

## 2015-05-20 NOTE — Progress Notes (Signed)
Subjective:   Whitney Molina is a 75 y.o. female who presents for Medicare Annual (Subsequent) preventive examination.  Review of Systems:  No ROS.  Medicare Wellness Visit.  Cardiac Risk Factors include: advanced age (>44men, >79 women);hypertension     Objective:     Vitals: BP 130/80 mmHg  Pulse 76  Temp(Src) 97.1 F (36.2 C) (Oral)  Resp 12  Ht 5\' 3"  (1.6 m)  Wt 146 lb (66.225 kg)  BMI 25.87 kg/m2  SpO2 98%  LMP   Tobacco History  Smoking status  . Never Smoker   Smokeless tobacco  . Never Used     Counseling given: Not Answered   Past Medical History  Diagnosis Date  . Bronchitis   . Hypertension   . Hyperlipidemia   . Hemorrhoids   . Heart murmur   . GERD (gastroesophageal reflux disease)   . History of kidney stones   . History of colon polyps   . Primary squamous cell carcinoma of chest wall ([place]) 2004    removed at [facility]  . Basal cell carcinoma of nose   . History of mammogram 2016   Past Surgical History  Procedure Laterality Date  . Tonsillectomy  1971  . Tubal ligation  1975  . Abdominal hysterectomy  1984  . Vaginal prolapse repair  July 2001    Pelvic Prolapse  . Cholecystectomy  06-01-03  . Lithotripsy  2005  . Bladder suspension  2010  . Local exicsion skin cancer  2004    squamous cell of chest wall. left chest  . Colonoscopy  2003   Family History  Problem Relation Age of Onset  . Cancer Mother     breast   . Stroke Mother   . Stroke Father   . Cancer Brother     bladder  . Stroke Brother    History  Sexual Activity  . Sexual Activity: Yes    Outpatient Encounter Prescriptions as of 05/20/2015  Medication Sig  . atorvastatin (LIPITOR) 20 MG tablet Take 20 mg by mouth daily.  Marland Kitchen escitalopram (LEXAPRO) 10 MG tablet TAKE ONE (1) TABLET BY MOUTH EVERY DAY  . hyoscyamine (LEVBID) 0.375 MG 12 hr tablet Take 1 tablet (0.375 mg total) by mouth daily.  Marland Kitchen losartan (COZAAR) 100 MG tablet Take 1 tablet (100 mg total) by  mouth daily.  . metoprolol succinate (TOPROL-XL) 25 MG 24 hr tablet Take 1 tablet (25 mg total) by mouth daily.  . potassium chloride SA (K-DUR,KLOR-CON) 20 MEQ tablet Take 1 tablet (20 mEq total) by mouth daily.  . Probiotic Product (PROBIOTIC PO) Take 1 capsule by mouth daily.  Marland Kitchen Spacer/Aero-Holding Chambers (AEROCHAMBER MV) inhaler Use as instructed  . triamterene-hydrochlorothiazide (MAXZIDE-25) 37.5-25 MG per tablet Take 0.5 tablets by mouth daily.  Marland Kitchen zoster vaccine live, PF, (ZOSTAVAX) 60454 UNT/0.65ML injection Inject 19,400 Units into the skin once. (Patient not taking: Reported on 05/20/2015)   No facility-administered encounter medications on file as of 05/20/2015.    Activities of Daily Living In your present state of health, do you have any difficulty performing the following activities: 05/20/2015  Hearing? N  Vision? N  Difficulty concentrating or making decisions? N  Walking or climbing stairs? N  Dressing or bathing? N  Doing errands, shopping? N  Preparing Food and eating ? N  Using the Toilet? N  In the past six months, have you accidently leaked urine? N  Do you have problems with loss of bowel control? N  Managing  your Medications? N  Managing your Finances? N  Housekeeping or managing your Housekeeping? N    Patient Care Team: [name], MD as PCP - General (Internal Medicine) Robert Bellow, MD (General Surgery)    Assessment:   This is a routine wellness examination for Whitney Molina. The goal of the wellness visit is to assist the patient how to close the gaps in care and create a preventative care plan for the patient.   Osteoporosis risk reviewed.  Medications reviewed; taking without issues or barriers.   Safety issues reviewed; smoke detectors in the home. Firearms locked in a secure area in the home. Wears seatbelts when driving or riding with others. No violence in the home.  No identified risk were noted; The patient was oriented x 3;  appropriate in dress and manner and no objective failures at ADL's or IADL's.   Oth lymp unsp xtrnd-stable and followed by Dr. Rogue Bussing  Non-Hodgkin lymph-stable and followed by Dr. Rogue Bussing Bronchiectas w/o ac-stable and followed by Dr. Derrel Nip Bronchiectasis, uncomplicated-stable and followed by Dr. Derrel Nip  Patient Concerns:  Some hard to watery bowel movements 1-2 weekly; Onset 3 months ago.  Denies pain.  Deferred to PCP for follow up.  Declined appointment to be scheduled today.  Encouraged to inform PCP if condition worsens and/or pain begins.  Exercise Activities and Dietary recommendations Current Exercise Habits:: Home exercise routine (Chair and floor exercises), Type of exercise: stretching, Time (Minutes): 15, Intensity: Mild  Goals    . Increase physical activity     Begin walking again up to 1- 2 miles daily.      Fall Risk Fall Risk  05/20/2015 12/23/2014 12/18/2013 10/03/2013 10/03/2013  Falls in the past year? No No No No No   Depression Screen PHQ 2/9 Scores 05/20/2015 12/23/2014 12/18/2013 10/03/2013  PHQ - 2 Score 0 0 0 0     Cognitive Testing MMSE - Mini Mental State Exam 05/20/2015  Orientation to time 5  Orientation to Place 5  Registration 3  Attention/ Calculation 5  Recall 3  Language- name 2 objects 2  Language- repeat 1  Language- follow 3 step command 3  Language- read & follow direction 1  Write a sentence 1  Copy design 1  Total score 30    Immunization History  Administered Date(s) Administered  . Influenza Split 01/16/2013, 02/26/2014  . Influenza,inj,Quad PF,36+ Mos 04/21/2015  . Pneumococcal Conjugate-13 06/24/2014  . Pneumococcal-Unspecified 05/16/2011  . Td 11/12/2012  . Tdap 11/21/2012  . Zoster 02/23/2015   Screening Tests Health Maintenance  Topic Date Due  . MAMMOGRAM  11/20/2015  . INFLUENZA VACCINE  12/14/2015  . COLONOSCOPY  05/15/2017  . TETANUS/TDAP  11/22/2022  . DEXA SCAN  Completed  . ZOSTAVAX  Completed  . PNA vac Low  Risk Adult  Completed      Plan:   End of life planning; Advance aging; Advanced directives discussed. Currently working on [employer] with husband.  Copy requested upon completion.  Follow up with PCP as needed.   During the course of the visit the patient was educated and counseled about the following appropriate screening and preventive services:   Vaccines to include Pneumoccal, Influenza, Hepatitis B, Td, Zostavax, HCV  Electrocardiogram  Cardiovascular Disease  Colorectal cancer screening  Bone density screening  Diabetes screening  Glaucoma screening  Mammography/PAP  Nutrition counseling   Patient Instructions (the written plan) was given to the patient.   Varney Biles, LPN  [phone]

## 2015-05-20 NOTE — Patient Instructions (Addendum)
Whitney Molina,  Thank you for taking time to come for your Medicare Wellness Visit.  I appreciate your ongoing commitment to your health goals. Please review the following plan we discussed and let me know if I can assist you in the future.  Return in April for annual physical with Dr. Derrel Nip  Happy New Year!  Health Maintenance, Female Adopting a healthy lifestyle and getting preventive care can go a long way to promote health and wellness. Talk with your health care provider about what schedule of regular examinations is right for you. This is a good chance for you to check in with your provider about disease prevention and staying healthy. In between checkups, there are plenty of things you can do on your own. Experts have done a lot of research about which lifestyle changes and preventive measures are most likely to keep you healthy. Ask your health care provider for more information. WEIGHT AND DIET  Eat a healthy diet  Be sure to include plenty of vegetables, fruits, low-fat dairy products, and lean protein.  Do not eat a lot of foods high in solid fats, added sugars, or salt.  Get regular exercise. This is one of the most important things you can do for your health.  Most adults should exercise for at least 150 minutes each week. The exercise should increase your heart rate and make you sweat (moderate-intensity exercise).  Most adults should also do strengthening exercises at least twice a week. This is in addition to the moderate-intensity exercise.  Maintain a healthy weight  Body mass index (BMI) is a measurement that can be used to identify possible weight problems. It estimates body fat based on height and weight. Your health care provider can help determine your BMI and help you achieve or maintain a healthy weight.  For females 88 years of age and older:   A BMI below 18.5 is considered underweight.  A BMI of 18.5 to 24.9 is normal.  A BMI of 25 to 29.9 is  considered overweight.  A BMI of 30 and above is considered obese.  Watch levels of cholesterol and blood lipids  You should start having your blood tested for lipids and cholesterol at 74 years of age, then have this test every 5 years.  You may need to have your cholesterol levels checked more often if:  Your lipid or cholesterol levels are high.  You are older than 74 years of age.  You are at high risk for heart disease.  CANCER SCREENING   Lung Cancer  Lung cancer screening is recommended for adults 23-75 years old who are at high risk for lung cancer because of a history of smoking.  A yearly low-dose CT scan of the lungs is recommended for people who:  Currently smoke.  Have quit within the past 15 years.  Have at least a 30-pack-year history of smoking. A pack year is smoking an average of one pack of cigarettes a day for 1 year.  Yearly screening should continue until it has been 15 years since you quit.  Yearly screening should stop if you develop a health problem that would prevent you from having lung cancer treatment.  Breast Cancer  Practice breast self-awareness. This means understanding how your breasts normally appear and feel.  It also means doing regular breast self-exams. Let your health care provider know about any changes, no matter how small.  If you are in your 20s or 30s, you should have a clinical breast exam (  CBE) by a health care provider every 1-3 years as part of a regular health exam.  If you are 48 or older, have a CBE every year. Also consider having a breast X-ray (mammogram) every year.  If you have a family history of breast cancer, talk to your health care provider about genetic screening.  If you are at high risk for breast cancer, talk to your health care provider about having an MRI and a mammogram every year.  Breast cancer gene (BRCA) assessment is recommended for women who have family members with BRCA-related cancers.  BRCA-related cancers include:  Breast.  Ovarian.  Tubal.  Peritoneal cancers.  Results of the assessment will determine the need for genetic counseling and BRCA1 and BRCA2 testing. Cervical Cancer Your health care provider may recommend that you be screened regularly for cancer of the pelvic organs (ovaries, uterus, and vagina). This screening involves a pelvic examination, including checking for microscopic changes to the surface of your cervix (Pap test). You may be encouraged to have this screening done every 3 years, beginning at age 31.  For women ages 63-65, health care providers may recommend pelvic exams and Pap testing every 3 years, or they may recommend the Pap and pelvic exam, combined with testing for human papilloma virus (HPV), every 5 years. Some types of HPV increase your risk of cervical cancer. Testing for HPV may also be done on women of any age with unclear Pap test results.  Other health care providers may not recommend any screening for nonpregnant women who are considered low risk for pelvic cancer and who do not have symptoms. Ask your health care provider if a screening pelvic exam is right for you.  If you have had past treatment for cervical cancer or a condition that could lead to cancer, you need Pap tests and screening for cancer for at least 20 years after your treatment. If Pap tests have been discontinued, your risk factors (such as having a new sexual partner) need to be reassessed to determine if screening should resume. Some women have medical problems that increase the chance of getting cervical cancer. In these cases, your health care provider may recommend more frequent screening and Pap tests. Colorectal Cancer  This type of cancer can be detected and often prevented.  Routine colorectal cancer screening usually begins at 74 years of age and continues through 74 years of age.  Your health care provider may recommend screening at an earlier age if you  have risk factors for colon cancer.  Your health care provider may also recommend using home test kits to check for hidden blood in the stool.  A small camera at the end of a tube can be used to examine your colon directly (sigmoidoscopy or colonoscopy). This is done to check for the earliest forms of colorectal cancer.  Routine screening usually begins at age 88.  Direct examination of the colon should be repeated every 5-10 years through 74 years of age. However, you may need to be screened more often if early forms of precancerous polyps or small growths are found. Skin Cancer  Check your skin from head to toe regularly.  Tell your health care provider about any new moles or changes in moles, especially if there is a change in a mole's shape or color.  Also tell your health care provider if you have a mole that is larger than the size of a pencil eraser.  Always use sunscreen. Apply sunscreen liberally and repeatedly throughout  the day.  Protect yourself by wearing long sleeves, pants, a wide-brimmed hat, and sunglasses whenever you are outside. HEART DISEASE, DIABETES, AND HIGH BLOOD PRESSURE   High blood pressure causes heart disease and increases the risk of stroke. High blood pressure is more likely to develop in:  People who have blood pressure in the high end of the normal range (130-139/85-89 mm Hg).  People who are overweight or obese.  People who are African American.  If you are 104-76 years of age, have your blood pressure checked every 3-5 years. If you are 61 years of age or older, have your blood pressure checked every year. You should have your blood pressure measured twice--once when you are at a hospital or clinic, and once when you are not at a hospital or clinic. Record the average of the two measurements. To check your blood pressure when you are not at a hospital or clinic, you can use:  An automated blood pressure machine at a pharmacy.  A home blood pressure  monitor.  If you are between 39 years and 63 years old, ask your health care provider if you should take aspirin to prevent strokes.  Have regular diabetes screenings. This involves taking a blood sample to check your fasting blood sugar level.  If you are at a normal weight and have a low risk for diabetes, have this test once every three years after 74 years of age.  If you are overweight and have a high risk for diabetes, consider being tested at a younger age or more often. PREVENTING INFECTION  Hepatitis B  If you have a higher risk for hepatitis B, you should be screened for this virus. You are considered at high risk for hepatitis B if:  You were born in a country where hepatitis B is common. Ask your health care provider which countries are considered high risk.  Your parents were born in a high-risk country, and you have not been immunized against hepatitis B (hepatitis B vaccine).  You have HIV or AIDS.  You use needles to inject street drugs.  You live with someone who has hepatitis B.  You have had sex with someone who has hepatitis B.  You get hemodialysis treatment.  You take certain medicines for conditions, including cancer, organ transplantation, and autoimmune conditions. Hepatitis C  Blood testing is recommended for:  Everyone born from [birth year] through 1964.  Anyone with known risk factors for hepatitis C. Sexually transmitted infections (STIs)  You should be screened for sexually transmitted infections (STIs) including gonorrhea and chlamydia if:  You are sexually active and are younger than 74 years of age.  You are older than 74 years of age and your health care provider tells you that you are at risk for this type of infection.  Your sexual activity has changed since you were last screened and you are at an increased risk for chlamydia or gonorrhea. Ask your health care provider if you are at risk.  If you do not have HIV, but are at risk, it may be  recommended that you take a prescription medicine daily to prevent HIV infection. This is called pre-exposure prophylaxis (PrEP). You are considered at risk if:  You are sexually active and do not regularly use condoms or know the HIV status of your partner(s).  You take drugs by injection.  You are sexually active with a partner who has HIV. Talk with your health care provider about whether you are at high risk  of being infected with HIV. If you choose to begin PrEP, you should first be tested for HIV. You should then be tested every 3 months for as long as you are taking PrEP.  PREGNANCY   If you are premenopausal and you may become pregnant, ask your health care provider about preconception counseling.  If you may become pregnant, take 400 to 800 micrograms (mcg) of folic acid every day.  If you want to prevent pregnancy, talk to your health care provider about birth control (contraception). OSTEOPOROSIS AND MENOPAUSE   Osteoporosis is a disease in which the bones lose minerals and strength with aging. This can result in serious bone fractures. Your risk for osteoporosis can be identified using a bone density scan.  If you are 22 years of age or older, or if you are at risk for osteoporosis and fractures, ask your health care provider if you should be screened.  Ask your health care provider whether you should take a calcium or vitamin D supplement to lower your risk for osteoporosis.  Menopause may have certain physical symptoms and risks.  Hormone replacement therapy may reduce some of these symptoms and risks. Talk to your health care provider about whether hormone replacement therapy is right for you.  HOME CARE INSTRUCTIONS   Schedule regular health, dental, and eye exams.  Stay current with your immunizations.   Do not use any tobacco products including cigarettes, chewing tobacco, or electronic cigarettes.  If you are pregnant, do not drink alcohol.  If you are  breastfeeding, limit how much and how often you drink alcohol.  Limit alcohol intake to no more than 1 drink per day for nonpregnant women. One drink equals 12 ounces of beer, 5 ounces of wine, or 1 ounces of hard liquor.  Do not use street drugs.  Do not share needles.  Ask your health care provider for help if you need support or information about quitting drugs.  Tell your health care provider if you often feel depressed.  Tell your health care provider if you have ever been abused or do not feel safe at home.   This information is not intended to replace advice given to you by your health care provider. Make sure you discuss any questions you have with your health care provider.   Document Released: 11/14/2010 Document Revised: 05/22/2014 Document Reviewed: 04/02/2013 Elsevier Interactive Patient Education [employer].

## 2015-05-20 NOTE — Progress Notes (Signed)
  I have reviewed the above information and agree with above.   Josephyne Tarter, MD 

## 2015-05-24 ENCOUNTER — Other Ambulatory Visit: Payer: Self-pay | Admitting: Internal Medicine

## 2015-05-24 ENCOUNTER — Other Ambulatory Visit: Payer: Self-pay | Admitting: Pulmonary Disease

## 2015-05-24 DIAGNOSIS — J301 Allergic rhinitis due to pollen: Secondary | ICD-10-CM | POA: Diagnosis not present

## 2015-05-27 ENCOUNTER — Other Ambulatory Visit: Payer: Self-pay | Admitting: Pulmonary Disease

## 2015-05-31 DIAGNOSIS — J301 Allergic rhinitis due to pollen: Secondary | ICD-10-CM | POA: Diagnosis not present

## 2015-06-01 DIAGNOSIS — L720 Epidermal cyst: Secondary | ICD-10-CM | POA: Diagnosis not present

## 2015-06-01 DIAGNOSIS — Z411 Encounter for cosmetic surgery: Secondary | ICD-10-CM | POA: Diagnosis not present

## 2015-06-01 DIAGNOSIS — L821 Other seborrheic keratosis: Secondary | ICD-10-CM | POA: Diagnosis not present

## 2015-06-01 DIAGNOSIS — L570 Actinic keratosis: Secondary | ICD-10-CM | POA: Diagnosis not present

## 2015-06-03 DIAGNOSIS — L700 Acne vulgaris: Secondary | ICD-10-CM | POA: Diagnosis not present

## 2015-06-04 DIAGNOSIS — J301 Allergic rhinitis due to pollen: Secondary | ICD-10-CM | POA: Diagnosis not present

## 2015-06-07 DIAGNOSIS — J301 Allergic rhinitis due to pollen: Secondary | ICD-10-CM | POA: Diagnosis not present

## 2015-06-14 DIAGNOSIS — L821 Other seborrheic keratosis: Secondary | ICD-10-CM | POA: Diagnosis not present

## 2015-06-14 DIAGNOSIS — L820 Inflamed seborrheic keratosis: Secondary | ICD-10-CM | POA: Diagnosis not present

## 2015-06-14 DIAGNOSIS — J301 Allergic rhinitis due to pollen: Secondary | ICD-10-CM | POA: Diagnosis not present

## 2015-06-17 DIAGNOSIS — L700 Acne vulgaris: Secondary | ICD-10-CM | POA: Diagnosis not present

## 2015-06-21 DIAGNOSIS — J301 Allergic rhinitis due to pollen: Secondary | ICD-10-CM | POA: Diagnosis not present

## 2015-06-23 ENCOUNTER — Other Ambulatory Visit: Payer: Self-pay | Admitting: Internal Medicine

## 2015-06-25 DIAGNOSIS — J301 Allergic rhinitis due to pollen: Secondary | ICD-10-CM | POA: Diagnosis not present

## 2015-06-28 DIAGNOSIS — J301 Allergic rhinitis due to pollen: Secondary | ICD-10-CM | POA: Diagnosis not present

## 2015-06-29 ENCOUNTER — Ambulatory Visit
Admission: RE | Admit: 2015-06-29 | Discharge: 2015-06-29 | Disposition: A | Discharge status: Home / Self Care | Payer: Medicare Other | Source: Ambulatory Visit | Attending: Internal Medicine | Admitting: Internal Medicine

## 2015-06-29 DIAGNOSIS — C8218 Follicular lymphoma grade II, lymph nodes of multiple sites: Secondary | ICD-10-CM | POA: Insufficient documentation

## 2015-06-29 DIAGNOSIS — C8590 Non-Hodgkin lymphoma, unspecified, unspecified site: Secondary | ICD-10-CM | POA: Diagnosis not present

## 2015-06-29 LAB — GLUCOSE, CAPILLARY: GLUCOSE-CAPILLARY: 70 mg/dL (ref 65–99)

## 2015-06-29 MED ORDER — FLUDEOXYGLUCOSE F - 18 (FDG) INJECTION
12.6100 | Freq: Once | INTRAVENOUS | Status: AC | PRN
  Administered 2015-06-29: 12.61 via INTRAVENOUS

## 2015-06-30 ENCOUNTER — Inpatient Hospital Stay (HOSPITAL_BASED_OUTPATIENT_CLINIC_OR_DEPARTMENT_OTHER): Payer: Medicare Other | Admitting: Internal Medicine

## 2015-06-30 ENCOUNTER — Inpatient Hospital Stay: Payer: Medicare Other | Attending: Internal Medicine

## 2015-06-30 VITALS — BP 135/78 | HR 61 | Temp 98.5°F | Ht 63.0 in | Wt 145.0 lb

## 2015-06-30 DIAGNOSIS — Z803 Family history of malignant neoplasm of breast: Secondary | ICD-10-CM

## 2015-06-30 DIAGNOSIS — B029 Zoster without complications: Secondary | ICD-10-CM | POA: Diagnosis not present

## 2015-06-30 DIAGNOSIS — K219 Gastro-esophageal reflux disease without esophagitis: Secondary | ICD-10-CM | POA: Insufficient documentation

## 2015-06-30 DIAGNOSIS — C8218 Follicular lymphoma grade II, lymph nodes of multiple sites: Secondary | ICD-10-CM | POA: Diagnosis not present

## 2015-06-30 DIAGNOSIS — Z79899 Other long term (current) drug therapy: Secondary | ICD-10-CM | POA: Insufficient documentation

## 2015-06-30 DIAGNOSIS — R011 Cardiac murmur, unspecified: Secondary | ICD-10-CM

## 2015-06-30 DIAGNOSIS — Z85828 Personal history of other malignant neoplasm of skin: Secondary | ICD-10-CM

## 2015-06-30 DIAGNOSIS — Z8551 Personal history of malignant neoplasm of bladder: Secondary | ICD-10-CM | POA: Insufficient documentation

## 2015-06-30 DIAGNOSIS — I10 Essential (primary) hypertension: Secondary | ICD-10-CM | POA: Diagnosis not present

## 2015-06-30 DIAGNOSIS — Z86010 Personal history of colonic polyps: Secondary | ICD-10-CM

## 2015-06-30 DIAGNOSIS — Z8589 Personal history of malignant neoplasm of other organs and systems: Secondary | ICD-10-CM | POA: Diagnosis not present

## 2015-06-30 DIAGNOSIS — E785 Hyperlipidemia, unspecified: Secondary | ICD-10-CM

## 2015-06-30 DIAGNOSIS — Z87442 Personal history of urinary calculi: Secondary | ICD-10-CM | POA: Diagnosis not present

## 2015-06-30 DIAGNOSIS — C8213 Follicular lymphoma grade II, intra-abdominal lymph nodes: Secondary | ICD-10-CM

## 2015-06-30 LAB — CBC WITH DIFFERENTIAL/PLATELET
BASOS PCT: 1 %
Basophils Absolute: 0.1 10*3/uL (ref 0–0.1)
EOS ABS: 0.2 10*3/uL (ref 0–0.7)
EOS PCT: 2 %
HCT: 37.9 % (ref 35.0–47.0)
HEMOGLOBIN: 13.2 g/dL (ref 12.0–16.0)
LYMPHS ABS: 2.3 10*3/uL (ref 1.0–3.6)
Lymphocytes Relative: 28 %
MCH: 32.2 pg (ref 26.0–34.0)
MCHC: 34.8 g/dL (ref 32.0–36.0)
MCV: 92.5 fL (ref 80.0–100.0)
MONO ABS: 0.8 10*3/uL (ref 0.2–0.9)
MONOS PCT: 10 %
NEUTROS PCT: 59 %
Neutro Abs: 5 10*3/uL (ref 1.4–6.5)
Platelets: 239 10*3/uL (ref 150–440)
RBC: 4.09 MIL/uL (ref 3.80–5.20)
RDW: 13.8 % (ref 11.5–14.5)
WBC: 8.4 10*3/uL (ref 3.6–11.0)

## 2015-06-30 LAB — COMPREHENSIVE METABOLIC PANEL
ALBUMIN: 4.4 g/dL (ref 3.5–5.0)
ALK PHOS: 69 U/L (ref 38–126)
ALT: 20 U/L (ref 14–54)
AST: 24 U/L (ref 15–41)
Anion gap: 5 (ref 5–15)
BUN: 16 mg/dL (ref 6–20)
CALCIUM: 8.7 mg/dL — AB (ref 8.9–10.3)
CHLORIDE: 102 mmol/L (ref 101–111)
CO2: 28 mmol/L (ref 22–32)
CREATININE: 1.03 mg/dL — AB (ref 0.44–1.00)
GFR calc non Af Amer: 53 mL/min — ABNORMAL LOW (ref >60)
GLUCOSE: 86 mg/dL (ref 65–99)
Potassium: 3.5 mmol/L (ref 3.5–5.1)
SODIUM: 135 mmol/L (ref 135–145)
Total Bilirubin: 0.7 mg/dL (ref 0.3–1.2)
Total Protein: 8.2 g/dL — ABNORMAL HIGH (ref 6.5–8.1)

## 2015-06-30 MED ORDER — ACYCLOVIR 400 MG PO TABS: 400.0000 mg | ORAL_TABLET | Freq: Two times a day (BID) | ORAL | Status: DC

## 2015-06-30 NOTE — Progress Notes (Signed)
[facility] OFFICE PROGRESS NOTE  Patient Care Team: [name], MD as PCP - General (Internal Medicine) Robert Bellow, MD (General Surgery)   SUMMARY OF ONCOLOGIC HISTORY:  # NOV 6948- FOLLICULAR LYMPHOMA G-2; [s/p Bx- RETROPERITONEAL/LEFT Peri-aortic LN ~ 2.5CM [incidental on CT scan in 2016; CT- Nov 2014- ~1CM]/ PET 2016-Left Peri-aortic LN; Suv ~15; Ext Iliac LN 6 mm; Feb 2017- Unchanged LN; START Rituxan q week x4.   # Hx of Shingles [Sep 2016]-   INTERVAL HISTORY: A very pleasant 75 year old female patient with history of follicular lymphoma grade 2 retroperitoneal currently under surveillance is here for follow-up/to review the results of the PET scan.   Patient complains of no back pain or any worsening abdominal pain. Continues to deny any weight loss or any night sweats or fevers.   REVIEW OF SYSTEMS:  A complete 10 point review of system is done which is negative except mentioned above/history of present illness.   PAST MEDICAL HISTORY :  Past Medical History  Diagnosis Date  . Bronchitis   . Hypertension   . Hyperlipidemia   . Hemorrhoids   . Heart murmur   . GERD (gastroesophageal reflux disease)   . History of kidney stones   . History of colon polyps   . 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 Basal cell carcinoma of nose   . History of mammogram 2016    PAST SURGICAL HISTORY :   Past Surgical History  Procedure Laterality Date  . Tonsillectomy  1971  . Tubal ligation  1975  . Abdominal hysterectomy  1984  . Vaginal prolapse repair  July 2001    Pelvic Prolapse  . Cholecystectomy  06-01-03  . Lithotripsy  2005  . Bladder suspension  2010  . Local exicsion skin cancer  2004    squamous cell of chest wall. left chest  . Colonoscopy  2003    FAMILY HISTORY :   Family History  Problem Relation Age of Onset  . Cancer Mother     breast   . Stroke Mother   . Stroke Father   . Cancer  Brother     bladder  . Stroke Brother     SOCIAL HISTORY:   Social History  Substance Use Topics  . Smoking status: Never Smoker   . Smokeless tobacco: Never Used  . Alcohol Use: No    ALLERGIES:  has No Known Allergies.  MEDICATIONS:  Current Outpatient Prescriptions  Medication Sig Dispense Refill  . acyclovir (ZOVIRAX) 400 MG tablet Take 1 tablet (400 mg total) by mouth 2 (two) times daily. 120 tablet 2  . atorvastatin (LIPITOR) 20 MG tablet Take 20 mg by mouth daily.    Marland Kitchen escitalopram (LEXAPRO) 10 MG tablet TAKE ONE (1) TABLET BY MOUTH EVERY DAY 90 tablet 4  . hyoscyamine (LEVBID) 0.375 MG 12 hr tablet Take 1 tablet (0.375 mg total) by mouth daily. 90 tablet 1  . losartan (COZAAR) 100 MG tablet TAKE ONE (1) TABLET BY MOUTH EVERY DAY 90 tablet 1  . metoprolol succinate (TOPROL-XL) 25 MG 24 hr tablet Take 1 tablet (25 mg total) by mouth daily. 90 tablet 3  . potassium chloride SA (K-DUR,KLOR-CON) 20 MEQ tablet TAKE ONE (1) TABLET EACH DAY 30 tablet 3  . Probiotic Product (PROBIOTIC PO) Take 1 capsule by mouth daily.    Marland Kitchen Spacer/Aero-Holding Chambers (AEROCHAMBER MV) inhaler Use as instructed 1 each 0  .  triamterene-hydrochlorothiazide (MAXZIDE-25) 37.5-25 MG per tablet Take 0.5 tablets by mouth daily. 90 tablet 3  . zoster vaccine live, PF, (ZOSTAVAX) 56314 UNT/0.65ML injection Inject 19,400 Units into the skin once. (Patient not taking: Reported on 05/20/2015) 1 each 0   No current facility-administered medications for this visit.    PHYSICAL EXAMINATION: ECOG PERFORMANCE STATUS: 0 - Asymptomatic  BP 135/78 mmHg  Pulse 61  Temp(Src) 98.5 F (36.9 C) (Tympanic)  Ht 5' 3"  (1.6 m)  Wt 145 lb (65.772 kg)  BMI 25.69 kg/m2  Filed Weights   06/30/15 1505  Weight: 145 lb (65.772 kg)   GENERAL: Well-nourished well-developed; Alert, no distress and comfortable. Accompanied her husband. EYES: no pallor or icterus OROPHARYNX: no thrush or ulceration; good dentition  NECK:  supple, no masses felt LYMPH: no palpable lymphadenopathy in the cervical, axillary or inguinal regions LUNGS: clear to auscultation and No wheeze or crackles HEART/CVS: regular rate & rhythm and no murmurs; No lower extremity edema ABDOMEN: abdomen soft, non-tender and normal bowel sounds Musculoskeletal:no cyanosis of digits and no clubbing  PSYCH: alert & oriented x 3 with fluent speech NEURO: no focal motor/sensory deficits SKIN: no rashes or significant lesions  LABORATORY DATA:  I have reviewed the data as listed    Component Value Date/Time   NA 135 06/30/2015 1448   NA 138 09/10/2013 0758   K 3.5 06/30/2015 1448   K 3.3* 09/10/2013 0758   CL 102 06/30/2015 1448   CL 100 09/10/2013 0758   CO2 28 06/30/2015 1448   CO2 32 09/10/2013 0758   GLUCOSE 86 06/30/2015 1448   GLUCOSE 85 09/10/2013 0758   BUN 16 06/30/2015 1448   BUN 13 09/10/2013 0758   CREATININE 1.03* 06/30/2015 1448   CREATININE 0.96 12/22/2013 0857   CREATININE 0.73 09/10/2013 0758   CALCIUM 8.7* 06/30/2015 1448   CALCIUM 9.2 09/10/2013 0758   PROT 8.2* 06/30/2015 1448   PROT 8.3* 09/10/2013 0758   ALBUMIN 4.4 06/30/2015 1448   ALBUMIN 3.5 09/10/2013 0758   AST 24 06/30/2015 1448   AST 23 09/10/2013 0758   ALT 20 06/30/2015 1448   ALT 28 09/10/2013 0758   ALKPHOS 69 06/30/2015 1448   ALKPHOS 78 09/10/2013 0758   BILITOT 0.7 06/30/2015 1448   BILITOT 0.6 09/10/2013 0758   GFRNONAA 53* 06/30/2015 1448   GFRNONAA 59* 12/22/2013 0857   GFRNONAA >60 09/10/2013 0758   GFRAA >60 06/30/2015 1448   GFRAA 68 12/22/2013 0857   GFRAA >60 09/10/2013 0758    No results found for: SPEP, UPEP  Lab Results  Component Value Date   WBC 8.4 06/30/2015   NEUTROABS 5.0 06/30/2015   HGB 13.2 06/30/2015   HCT 37.9 06/30/2015   MCV 92.5 06/30/2015   PLT 239 06/30/2015      Chemistry      Component Value Date/Time   NA 135 06/30/2015 1448   NA 138 09/10/2013 0758   K 3.5 06/30/2015 1448   K 3.3*  09/10/2013 0758   CL 102 06/30/2015 1448   CL 100 09/10/2013 0758   CO2 28 06/30/2015 1448   CO2 32 09/10/2013 0758   BUN 16 06/30/2015 1448   BUN 13 09/10/2013 0758   CREATININE 1.03* 06/30/2015 1448   CREATININE 0.96 12/22/2013 0857   CREATININE 0.73 09/10/2013 0758      Component Value Date/Time   CALCIUM 8.7* 06/30/2015 1448   CALCIUM 9.2 09/10/2013 0758   ALKPHOS 69 06/30/2015 1448   ALKPHOS 78  09/10/2013 0758   AST 24 06/30/2015 1448   AST 23 09/10/2013 0758   ALT 20 06/30/2015 1448   ALT 28 09/10/2013 0758   BILITOT 0.7 06/30/2015 1448   BILITOT 0.6 09/10/2013 0758       RADIOGRAPHIC STUDIES: I have personally reviewed the radiological images as listed and agreed with the findings in the report. Nm Pet Image Restag (ps) Skull Base To Thigh  06/29/2015  CLINICAL DATA:  Subsequent treatment strategy for lymphoma. EXAM: NUCLEAR MEDICINE PET SKULL BASE TO THIGH TECHNIQUE: 12.6 mCi F-18 FDG was injected intravenously. Full-ring PET imaging was performed from the skull base to thigh after the radiotracer. CT data was obtained and used for attenuation correction and anatomic localization. FASTING BLOOD GLUCOSE:  Value: 70 mg/dl COMPARISON:  03/11/2015. FINDINGS: NECK No hypermetabolic lymph nodes in the neck. CHEST No hypermetabolic mediastinal or hilar nodes. Aortic atherosclerosis. No suspicious pulmonary nodules on the CT scan. ABDOMEN/PELVIS No abnormal hypermetabolic activity within the liver, pancreas, adrenal glands, or spleen. Periaortic lymph node measures 2.3 cm and has an SUV max equal 14.98. Previously this measured 2.75 an SUV max equal to 15.6. Adjacent lymph node measures 8 mm and has an SUV max equal to 3.75. Previously this node measured 0.9 cm and had an SUV max equal to 4.9. SKELETON No focal hypermetabolic activity to suggest skeletal metastasis. IMPRESSION: 1. Persistent hypermetabolic periaortic adenopathy. This is not significantly changed when compared with  previous exam. 2. No new or progressive disease identified. Electronically Signed   By: Kerby Moors M.D.   On: 06/29/2015 13:30     ASSESSMENT & PLAN:  # RETROPERITONEAL LN [incidental/asymptomatic]- follicular lymphoma grade 2; likely stage II [bone marrow biopsy not done]. Patient is currently asymptomatic. PET scan- shows stable retroperitoneal adenopathy/pelvic adenopathy. I reviewed the images myself;reviewed the images with the patient and her husband.  # I again discussed the options of continued surveillance versus single agent rituximab IV every weekly 4 versus chemoimmunotherapy. I think it single agent Rituxan every week 4 would be a reasonable option/especially given her non-bulky disease. Discussed that low-grade lymphomas are usually incurable; however single agent Rituxan can  Cause 70% response rates.  I discussed the potential side effects of Rituxan including but not limited to infusion reaction; the risk of atypical infections etc.  # recent history of shingles/ recommend acyclovir prophylaxis for 3 months. New prescription sent over to her pharmacy.  # 25 minutes face-to-face with the patient discussing the above plan of care; more than 50% of time spent on prognosis/ natural history; counseling and coordination.     Cammie Sickle, MD 06/30/2015 3:37 PM

## 2015-06-30 NOTE — Patient Instructions (Signed)
Rituximab injection What is this medicine? RITUXIMAB (ri TUX i mab) is a monoclonal antibody. It is used commonly to treat non-Hodgkin lymphoma and other conditions. It is also used to treat rheumatoid arthritis (RA). In RA, this medicine slows the inflammatory process and help reduce joint pain and swelling. This medicine is often used with other cancer or arthritis medications. This medicine may be used for other purposes; ask your health care provider or pharmacist if you have questions. What should I tell my health care provider before I take this medicine? They need to know if you have any of these conditions: -blood disorders -heart disease -history of hepatitis B -infection (especially a virus infection such as chickenpox, cold sores, or herpes) -irregular heartbeat -kidney disease -lung or breathing disease, like asthma -lupus -an unusual or allergic reaction to rituximab, mouse proteins, other medicines, foods, dyes, or preservatives -pregnant or trying to get pregnant -breast-feeding How should I use this medicine? This medicine is for infusion into a vein. It is administered in a hospital or clinic by a specially trained health care professional. A special MedGuide will be given to you by the pharmacist with each prescription and refill. Be sure to read this information carefully each time. Talk to your pediatrician regarding the use of this medicine in children. This medicine is not approved for use in children. Overdosage: If you think you have taken too much of this medicine contact a poison control center or emergency room at once. NOTE: This medicine is only for you. Do not share this medicine with others. What if I miss a dose? It is important not to miss a dose. Call your doctor or health care professional if you are unable to keep an appointment. What may interact with this medicine? -cisplatin -medicines for blood pressure -some other medicines for  arthritis -vaccines This list may not describe all possible interactions. Give your health care provider a list of all the medicines, herbs, non-prescription drugs, or dietary supplements you use. Also tell them if you smoke, drink alcohol, or use illegal drugs. Some items may interact with your medicine. What should I watch for while using this medicine? Report any side effects that you notice during your treatment right away, such as changes in your breathing, fever, chills, dizziness or lightheadedness. These effects are more common with the first dose. Visit your prescriber or health care professional for checks on your progress. You will need to have regular blood work. Report any other side effects. The side effects of this medicine can continue after you finish your treatment. Continue your course of treatment even though you feel ill unless your doctor tells you to stop. Call your doctor or health care professional for advice if you get a fever, chills or sore throat, or other symptoms of a cold or flu. Do not treat yourself. This drug decreases your body's ability to fight infections. Try to avoid being around people who are sick. This medicine may increase your risk to bruise or bleed. Call your doctor or health care professional if you notice any unusual bleeding. Be careful brushing and flossing your teeth or using a toothpick because you may get an infection or bleed more easily. If you have any dental work done, tell your dentist you are receiving this medicine. Avoid taking products that contain aspirin, acetaminophen, ibuprofen, naproxen, or ketoprofen unless instructed by your doctor. These medicines may hide a fever. Do not become pregnant while taking this medicine. Women should inform their doctor if   they wish to become pregnant or think they might be pregnant. There is a potential for serious side effects to an unborn child. Talk to your health care professional or pharmacist for more  information. Do not breast-feed an infant while taking this medicine. What side effects may I notice from receiving this medicine? Side effects that you should report to your doctor or health care professional as soon as possible: -allergic reactions like skin rash, itching or hives, swelling of the face, lips, or tongue -low blood counts - this medicine may decrease the number of white blood cells, red blood cells and platelets. You may be at increased risk for infections and bleeding. -signs of infection - fever or chills, cough, sore throat, pain or difficulty passing urine -signs of decreased platelets or bleeding - bruising, pinpoint red spots on the skin, black, tarry stools, blood in the urine -signs of decreased red blood cells - unusually weak or tired, fainting spells, lightheadedness -breathing problems -confused, not responsive -chest pain -fast, irregular heartbeat -feeling faint or lightheaded, falls -mouth sores -redness, blistering, peeling or loosening of the skin, including inside the mouth -stomach pain -swelling of the ankles, feet, or hands -trouble passing urine or change in the amount of urine Side effects that usually do not require medical attention (report to your doctor or other health care professional if they continue or are bothersome): -anxiety -headache -loss of appetite -muscle aches -nausea -night sweats This list may not describe all possible side effects. Call your doctor for medical advice about side effects. You may report side effects to FDA at 1-800-FDA-1088. Where should I keep my medicine? This drug is given in a hospital or clinic and will not be stored at home. NOTE: This sheet is a summary. It may not cover all possible information. If you have questions about this medicine, talk to your doctor, pharmacist, or health care provider.    2016, [employer]vier/Gold Standard. (2014-07-08 22:30:56)  

## 2015-07-01 LAB — HEPATITIS B SURFACE ANTIBODY, QUANTITATIVE: Hepatitis B-Post: <3.1 m[IU]/mL — ABNORMAL LOW (ref >9.9)

## 2015-07-05 DIAGNOSIS — J301 Allergic rhinitis due to pollen: Secondary | ICD-10-CM | POA: Diagnosis not present

## 2015-07-07 ENCOUNTER — Inpatient Hospital Stay: Payer: Medicare Other

## 2015-07-07 VITALS — BP 146/94 | HR 74 | Resp 20

## 2015-07-07 DIAGNOSIS — K219 Gastro-esophageal reflux disease without esophagitis: Secondary | ICD-10-CM | POA: Diagnosis not present

## 2015-07-07 DIAGNOSIS — E785 Hyperlipidemia, unspecified: Secondary | ICD-10-CM | POA: Diagnosis not present

## 2015-07-07 DIAGNOSIS — C8218 Follicular lymphoma grade II, lymph nodes of multiple sites: Secondary | ICD-10-CM | POA: Diagnosis not present

## 2015-07-07 DIAGNOSIS — C8213 Follicular lymphoma grade II, intra-abdominal lymph nodes: Secondary | ICD-10-CM

## 2015-07-07 DIAGNOSIS — R011 Cardiac murmur, unspecified: Secondary | ICD-10-CM | POA: Diagnosis not present

## 2015-07-07 DIAGNOSIS — B029 Zoster without complications: Secondary | ICD-10-CM | POA: Diagnosis not present

## 2015-07-07 DIAGNOSIS — I10 Essential (primary) hypertension: Secondary | ICD-10-CM | POA: Diagnosis not present

## 2015-07-07 MED ORDER — DIPHENHYDRAMINE HCL 25 MG PO CAPS
50.0000 mg | ORAL_CAPSULE | Freq: Once | ORAL | Status: AC
  Administered 2015-07-07: 50 mg via ORAL
  Filled 2015-07-07: qty 2

## 2015-07-07 MED ORDER — SODIUM CHLORIDE 0.9 % IV SOLN
375.0000 mg/m2 | Freq: Once | INTRAVENOUS | Status: AC
  Administered 2015-07-07: 600 mg via INTRAVENOUS
  Filled 2015-07-07: qty 50

## 2015-07-07 MED ORDER — ACETAMINOPHEN 325 MG PO TABS
650.0000 mg | ORAL_TABLET | Freq: Once | ORAL | Status: AC
  Administered 2015-07-07: 650 mg via ORAL
  Filled 2015-07-07: qty 2

## 2015-07-07 MED ORDER — SODIUM CHLORIDE 0.9 % IV SOLN
Freq: Once | INTRAVENOUS | Status: AC
  Administered 2015-07-07: 11:00:00 via INTRAVENOUS
  Filled 2015-07-07: qty 1000

## 2015-07-12 DIAGNOSIS — J301 Allergic rhinitis due to pollen: Secondary | ICD-10-CM | POA: Diagnosis not present

## 2015-07-14 ENCOUNTER — Inpatient Hospital Stay: Payer: Medicare Other | Attending: Internal Medicine

## 2015-07-14 VITALS — BP 137/82 | HR 65 | Temp 97.0°F | Resp 20

## 2015-07-14 DIAGNOSIS — K219 Gastro-esophageal reflux disease without esophagitis: Secondary | ICD-10-CM | POA: Diagnosis not present

## 2015-07-14 DIAGNOSIS — Z8052 Family history of malignant neoplasm of bladder: Secondary | ICD-10-CM | POA: Diagnosis not present

## 2015-07-14 DIAGNOSIS — I10 Essential (primary) hypertension: Secondary | ICD-10-CM | POA: Insufficient documentation

## 2015-07-14 DIAGNOSIS — Z8589 Personal history of malignant neoplasm of other organs and systems: Secondary | ICD-10-CM | POA: Diagnosis not present

## 2015-07-14 DIAGNOSIS — Z5112 Encounter for antineoplastic immunotherapy: Secondary | ICD-10-CM | POA: Insufficient documentation

## 2015-07-14 DIAGNOSIS — Z87442 Personal history of urinary calculi: Secondary | ICD-10-CM | POA: Diagnosis not present

## 2015-07-14 DIAGNOSIS — Z79899 Other long term (current) drug therapy: Secondary | ICD-10-CM | POA: Insufficient documentation

## 2015-07-14 DIAGNOSIS — Z803 Family history of malignant neoplasm of breast: Secondary | ICD-10-CM | POA: Diagnosis not present

## 2015-07-14 DIAGNOSIS — R011 Cardiac murmur, unspecified: Secondary | ICD-10-CM | POA: Insufficient documentation

## 2015-07-14 DIAGNOSIS — Z86010 Personal history of colonic polyps: Secondary | ICD-10-CM | POA: Diagnosis not present

## 2015-07-14 DIAGNOSIS — Z85828 Personal history of other malignant neoplasm of skin: Secondary | ICD-10-CM | POA: Insufficient documentation

## 2015-07-14 DIAGNOSIS — E785 Hyperlipidemia, unspecified: Secondary | ICD-10-CM | POA: Diagnosis not present

## 2015-07-14 DIAGNOSIS — C8213 Follicular lymphoma grade II, intra-abdominal lymph nodes: Secondary | ICD-10-CM | POA: Diagnosis not present

## 2015-07-14 MED ORDER — SODIUM CHLORIDE 0.9 % IV SOLN
Freq: Once | INTRAVENOUS | Status: AC
  Administered 2015-07-14: 09:00:00 via INTRAVENOUS
  Filled 2015-07-14: qty 1000

## 2015-07-14 MED ORDER — DIPHENHYDRAMINE HCL 25 MG PO CAPS
50.0000 mg | ORAL_CAPSULE | Freq: Once | ORAL | Status: AC
  Administered 2015-07-14: 50 mg via ORAL
  Filled 2015-07-14: qty 2

## 2015-07-14 MED ORDER — SODIUM CHLORIDE 0.9 % IV SOLN: 375.0000 mg/m2 | Freq: Once | INTRAVENOUS | Status: DC

## 2015-07-14 MED ORDER — RITUXIMAB CHEMO INJECTION 500 MG/50ML
375.0000 mg/m2 | Freq: Once | INTRAVENOUS | Status: AC
  Administered 2015-07-14: 600 mg via INTRAVENOUS
  Filled 2015-07-14: qty 50

## 2015-07-14 MED ORDER — ACETAMINOPHEN 325 MG PO TABS
650.0000 mg | ORAL_TABLET | Freq: Once | ORAL | Status: AC
  Administered 2015-07-14: 650 mg via ORAL
  Filled 2015-07-14: qty 2

## 2015-07-16 ENCOUNTER — Other Ambulatory Visit: Payer: Self-pay | Admitting: Internal Medicine

## 2015-07-16 NOTE — Telephone Encounter (Signed)
Please advise refill, per patient she takes off a pill a day.  thanks

## 2015-07-18 NOTE — Telephone Encounter (Signed)
Ok to refill,  Refill sent  

## 2015-07-19 DIAGNOSIS — J301 Allergic rhinitis due to pollen: Secondary | ICD-10-CM | POA: Diagnosis not present

## 2015-07-21 ENCOUNTER — Inpatient Hospital Stay: Payer: Medicare Other

## 2015-07-21 VITALS — BP 134/84 | HR 73 | Temp 95.4°F | Resp 20

## 2015-07-21 DIAGNOSIS — C8213 Follicular lymphoma grade II, intra-abdominal lymph nodes: Secondary | ICD-10-CM

## 2015-07-21 DIAGNOSIS — K219 Gastro-esophageal reflux disease without esophagitis: Secondary | ICD-10-CM | POA: Diagnosis not present

## 2015-07-21 DIAGNOSIS — R011 Cardiac murmur, unspecified: Secondary | ICD-10-CM | POA: Diagnosis not present

## 2015-07-21 DIAGNOSIS — Z5112 Encounter for antineoplastic immunotherapy: Secondary | ICD-10-CM | POA: Diagnosis not present

## 2015-07-21 DIAGNOSIS — E785 Hyperlipidemia, unspecified: Secondary | ICD-10-CM | POA: Diagnosis not present

## 2015-07-21 DIAGNOSIS — I10 Essential (primary) hypertension: Secondary | ICD-10-CM | POA: Diagnosis not present

## 2015-07-21 MED ORDER — ACETAMINOPHEN 325 MG PO TABS
650.0000 mg | ORAL_TABLET | Freq: Once | ORAL | Status: AC
  Administered 2015-07-21: 650 mg via ORAL
  Filled 2015-07-21: qty 2

## 2015-07-21 MED ORDER — SODIUM CHLORIDE 0.9 % IV SOLN
600.0000 mg | Freq: Once | INTRAVENOUS | Status: AC
  Administered 2015-07-21: 600 mg via INTRAVENOUS
  Filled 2015-07-21: qty 50

## 2015-07-21 MED ORDER — SODIUM CHLORIDE 0.9 % IV SOLN: 375.0000 mg/m2 | Freq: Once | INTRAVENOUS | Status: DC

## 2015-07-21 MED ORDER — DIPHENHYDRAMINE HCL 25 MG PO CAPS
50.0000 mg | ORAL_CAPSULE | Freq: Once | ORAL | Status: AC
  Administered 2015-07-21: 50 mg via ORAL
  Filled 2015-07-21: qty 2

## 2015-07-21 MED ORDER — SODIUM CHLORIDE 0.9 % IV SOLN
Freq: Once | INTRAVENOUS | Status: AC
  Administered 2015-07-21: 10:00:00 via INTRAVENOUS
  Filled 2015-07-21: qty 1000

## 2015-07-26 DIAGNOSIS — J301 Allergic rhinitis due to pollen: Secondary | ICD-10-CM | POA: Diagnosis not present

## 2015-07-28 ENCOUNTER — Inpatient Hospital Stay: Payer: Medicare Other

## 2015-07-28 ENCOUNTER — Other Ambulatory Visit: Payer: Self-pay | Admitting: *Deleted

## 2015-07-28 ENCOUNTER — Other Ambulatory Visit: Payer: Self-pay | Admitting: Internal Medicine

## 2015-07-28 VITALS — BP 155/83 | HR 61 | Resp 18

## 2015-07-28 DIAGNOSIS — I10 Essential (primary) hypertension: Secondary | ICD-10-CM | POA: Diagnosis not present

## 2015-07-28 DIAGNOSIS — R011 Cardiac murmur, unspecified: Secondary | ICD-10-CM | POA: Diagnosis not present

## 2015-07-28 DIAGNOSIS — C8213 Follicular lymphoma grade II, intra-abdominal lymph nodes: Secondary | ICD-10-CM | POA: Diagnosis not present

## 2015-07-28 DIAGNOSIS — Z5112 Encounter for antineoplastic immunotherapy: Secondary | ICD-10-CM | POA: Diagnosis not present

## 2015-07-28 DIAGNOSIS — K219 Gastro-esophageal reflux disease without esophagitis: Secondary | ICD-10-CM | POA: Diagnosis not present

## 2015-07-28 DIAGNOSIS — E785 Hyperlipidemia, unspecified: Secondary | ICD-10-CM | POA: Diagnosis not present

## 2015-07-28 LAB — COMPREHENSIVE METABOLIC PANEL
ALT: 24 U/L (ref 14–54)
AST: 23 U/L (ref 15–41)
Albumin: 4.2 g/dL (ref 3.5–5.0)
Alkaline Phosphatase: 70 U/L (ref 38–126)
Anion gap: 6 (ref 5–15)
BUN: 16 mg/dL (ref 6–20)
CHLORIDE: 99 mmol/L — AB (ref 101–111)
CO2: 28 mmol/L (ref 22–32)
CREATININE: 0.95 mg/dL (ref 0.44–1.00)
Calcium: 8.7 mg/dL — ABNORMAL LOW (ref 8.9–10.3)
GFR calc non Af Amer: 58 mL/min — ABNORMAL LOW (ref >60)
Glucose, Bld: 99 mg/dL (ref 65–99)
POTASSIUM: 3.7 mmol/L (ref 3.5–5.1)
SODIUM: 133 mmol/L — AB (ref 135–145)
Total Bilirubin: 0.8 mg/dL (ref 0.3–1.2)
Total Protein: 8.1 g/dL (ref 6.5–8.1)

## 2015-07-28 LAB — CBC WITH DIFFERENTIAL/PLATELET
BASOS ABS: 0 10*3/uL (ref 0–0.1)
Basophils Relative: 1 %
EOS ABS: 0.2 10*3/uL (ref 0–0.7)
EOS PCT: 3 %
HCT: 37.3 % (ref 35.0–47.0)
Hemoglobin: 13.1 g/dL (ref 12.0–16.0)
LYMPHS PCT: 23 %
Lymphs Abs: 1.5 10*3/uL (ref 1.0–3.6)
MCH: 32.5 pg (ref 26.0–34.0)
MCHC: 35.1 g/dL (ref 32.0–36.0)
MCV: 92.6 fL (ref 80.0–100.0)
Monocytes Absolute: 0.6 10*3/uL (ref 0.2–0.9)
Monocytes Relative: 9 %
NEUTROS ABS: 4.5 10*3/uL (ref 1.4–6.5)
NEUTROS PCT: 66 %
PLATELETS: 226 10*3/uL (ref 150–440)
RBC: 4.03 MIL/uL (ref 3.80–5.20)
RDW: 13.4 % (ref 11.5–14.5)
WBC: 6.8 10*3/uL (ref 3.6–11.0)

## 2015-07-28 MED ORDER — SODIUM CHLORIDE 0.9 % IV SOLN
375.0000 mg/m2 | Freq: Once | INTRAVENOUS | Status: AC
  Administered 2015-07-28: 600 mg via INTRAVENOUS
  Filled 2015-07-28: qty 50

## 2015-07-28 MED ORDER — DIPHENHYDRAMINE HCL 25 MG PO CAPS
50.0000 mg | ORAL_CAPSULE | Freq: Once | ORAL | Status: AC
  Administered 2015-07-28: 50 mg via ORAL
  Filled 2015-07-28: qty 2

## 2015-07-28 MED ORDER — SODIUM CHLORIDE 0.9 % IV SOLN: 375.0000 mg/m2 | Freq: Once | INTRAVENOUS | Status: DC

## 2015-07-28 MED ORDER — SODIUM CHLORIDE 0.9 % IV SOLN
Freq: Once | INTRAVENOUS | Status: AC
  Administered 2015-07-28: 10:00:00 via INTRAVENOUS
  Filled 2015-07-28: qty 1000

## 2015-07-28 MED ORDER — ACETAMINOPHEN 325 MG PO TABS
650.0000 mg | ORAL_TABLET | Freq: Once | ORAL | Status: AC
  Administered 2015-07-28: 650 mg via ORAL
  Filled 2015-07-28: qty 2

## 2015-08-02 DIAGNOSIS — J301 Allergic rhinitis due to pollen: Secondary | ICD-10-CM | POA: Diagnosis not present

## 2015-08-09 ENCOUNTER — Other Ambulatory Visit: Payer: Self-pay | Admitting: Internal Medicine

## 2015-08-09 DIAGNOSIS — J301 Allergic rhinitis due to pollen: Secondary | ICD-10-CM | POA: Diagnosis not present

## 2015-08-11 ENCOUNTER — Inpatient Hospital Stay (HOSPITAL_BASED_OUTPATIENT_CLINIC_OR_DEPARTMENT_OTHER): Payer: Medicare Other | Admitting: Internal Medicine

## 2015-08-11 VITALS — BP 135/87 | HR 67 | Temp 97.2°F | Resp 18 | Wt 148.8 lb

## 2015-08-11 DIAGNOSIS — I10 Essential (primary) hypertension: Secondary | ICD-10-CM | POA: Diagnosis not present

## 2015-08-11 DIAGNOSIS — Z87442 Personal history of urinary calculi: Secondary | ICD-10-CM

## 2015-08-11 DIAGNOSIS — Z803 Family history of malignant neoplasm of breast: Secondary | ICD-10-CM

## 2015-08-11 DIAGNOSIS — C8213 Follicular lymphoma grade II, intra-abdominal lymph nodes: Secondary | ICD-10-CM

## 2015-08-11 DIAGNOSIS — C8280 Other types of follicular lymphoma, unspecified site: Secondary | ICD-10-CM

## 2015-08-11 DIAGNOSIS — R011 Cardiac murmur, unspecified: Secondary | ICD-10-CM

## 2015-08-11 DIAGNOSIS — Z8589 Personal history of malignant neoplasm of other organs and systems: Secondary | ICD-10-CM

## 2015-08-11 DIAGNOSIS — E785 Hyperlipidemia, unspecified: Secondary | ICD-10-CM

## 2015-08-11 DIAGNOSIS — Z8052 Family history of malignant neoplasm of bladder: Secondary | ICD-10-CM

## 2015-08-11 DIAGNOSIS — Z79899 Other long term (current) drug therapy: Secondary | ICD-10-CM

## 2015-08-11 DIAGNOSIS — Z85828 Personal history of other malignant neoplasm of skin: Secondary | ICD-10-CM

## 2015-08-11 DIAGNOSIS — Z5112 Encounter for antineoplastic immunotherapy: Secondary | ICD-10-CM | POA: Diagnosis not present

## 2015-08-11 DIAGNOSIS — K219 Gastro-esophageal reflux disease without esophagitis: Secondary | ICD-10-CM | POA: Diagnosis not present

## 2015-08-11 DIAGNOSIS — Z86010 Personal history of colonic polyps: Secondary | ICD-10-CM

## 2015-08-11 NOTE — Progress Notes (Signed)
[facility] OFFICE PROGRESS NOTE  Patient Care Team: [name], MD as PCP - General (Internal Medicine) Robert Bellow, MD (General Surgery)   SUMMARY OF ONCOLOGIC HISTORY:  # NOV 9458- FOLLICULAR LYMPHOMA G-2; [s/p Bx- RETROPERITONEAL/LEFT Peri-aortic LN ~ 2.5CM [incidental on CT scan in 2016; CT- Nov 2014- ~1CM]/ PET 2016-Left Peri-aortic LN; Suv ~15; Ext Iliac LN 6 mm; Feb 2017- Unchanged LN; START Rituxan q week x4 [finished march 15th]  # Hx of Shingles [Sep 2016]-   INTERVAL HISTORY: A very pleasant 75 year old female patient with history of follicular lymphoma grade 2 retroperitoneal is currently status post 4 infusions of rituximab single agent is here for follow-up. Patient finished rituxin in approximately 2 weeks ago.  Denies any skin rash or reactivation of shingles. No nausea no vomiting. Appetite is good. No diarrhea or constipation.  Patient complains of no back pain or any worsening abdominal pain. Continues to deny any weight loss or any night sweats or fevers.   REVIEW OF SYSTEMS:  A complete 10 point review of system is done which is negative except mentioned above/history of present illness.   PAST MEDICAL HISTORY :  Past Medical History  Diagnosis Date  . Bronchitis   . Hypertension   . Hyperlipidemia   . Hemorrhoids   . Heart murmur   . GERD (gastroesophageal reflux disease)   . History of kidney stones   . History of colon polyps   . 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 Basal cell carcinoma of nose   . History of mammogram 2016    PAST SURGICAL HISTORY :   Past Surgical History  Procedure Laterality Date  . Tonsillectomy  1971  . Tubal ligation  1975  . Abdominal hysterectomy  1984  . Vaginal prolapse repair  July 2001    Pelvic Prolapse  . Cholecystectomy  06-01-03  . Lithotripsy  2005  . Bladder suspension  2010  . Local exicsion skin cancer  2004    squamous cell of chest wall.  left chest  . Colonoscopy  2003    FAMILY HISTORY :   Family History  Problem Relation Age of Onset  . Cancer Mother     breast   . Stroke Mother   . Stroke Father   . Cancer Brother     bladder  . Stroke Brother     SOCIAL HISTORY:   Social History  Substance Use Topics  . Smoking status: Never Smoker   . Smokeless tobacco: Never Used  . Alcohol Use: No    ALLERGIES:  has No Known Allergies.  MEDICATIONS:  Current Outpatient Prescriptions  Medication Sig Dispense Refill  . acyclovir (ZOVIRAX) 400 MG tablet Take 1 tablet (400 mg total) by mouth 2 (two) times daily. 120 tablet 2  . atorvastatin (LIPITOR) 20 MG tablet Take 20 mg by mouth daily.    . budesonide-formoterol (SYMBICORT) 80-4.5 MCG/ACT inhaler Inhale 2 puffs into the lungs 2 (two) times daily.    Marland Kitchen escitalopram (LEXAPRO) 10 MG tablet TAKE ONE (1) TABLET BY MOUTH EVERY DAY 90 tablet 4  . hyoscyamine (LEVBID) 0.375 MG 12 hr tablet TAKE ONE (1) TABLET BY MOUTH EVERY DAY 90 tablet 2  . losartan (COZAAR) 100 MG tablet TAKE ONE (1) TABLET BY MOUTH EVERY DAY 90 tablet 1  . metoprolol succinate (TOPROL-XL) 25 MG 24 hr tablet TAKE ONE (1) TABLET BY MOUTH EVERY DAY 90 tablet  1  . potassium chloride SA (K-DUR,KLOR-CON) 20 MEQ tablet TAKE ONE (1) TABLET EACH DAY 30 tablet 3  . Spacer/Aero-Holding Chambers (AEROCHAMBER MV) inhaler Use as instructed 1 each 0  . triamterene-hydrochlorothiazide (MAXZIDE-25) 37.5-25 MG per tablet Take 0.5 tablets by mouth daily. 90 tablet 3  . zoster vaccine live, PF, (ZOSTAVAX) 81191 UNT/0.65ML injection Inject 19,400 Units into the skin once. 1 each 0   No current facility-administered medications for this visit.    PHYSICAL EXAMINATION: ECOG PERFORMANCE STATUS: 0 - Asymptomatic  BP 135/87 mmHg  Pulse 67  Temp(Src) 97.2 F (36.2 C) (Tympanic)  Resp 18  Wt 148 lb 13 oz (67.5 kg)  Filed Weights   08/11/15 0959  Weight: 148 lb 13 oz (67.5 kg)   GENERAL: Well-nourished well-developed;  Alert, no distress and comfortable. Accompanied her husband. EYES: no pallor or icterus OROPHARYNX: no thrush or ulceration; good dentition  NECK: supple, no masses felt LYMPH: no palpable lymphadenopathy in the cervical, axillary or inguinal regions LUNGS: clear to auscultation and No wheeze or crackles HEART/CVS: regular rate & rhythm and no murmurs; No lower extremity edema ABDOMEN: abdomen soft, non-tender and normal bowel sounds Musculoskeletal:no cyanosis of digits and no clubbing  PSYCH: alert & oriented x 3 with fluent speech NEURO: no focal motor/sensory deficits SKIN: no rashes or significant lesions  LABORATORY DATA:  I have reviewed the data as listed    Component Value Date/Time   NA 133* 07/28/2015 0931   NA 138 09/10/2013 0758   K 3.7 07/28/2015 0931   K 3.3* 09/10/2013 0758   CL 99* 07/28/2015 0931   CL 100 09/10/2013 0758   CO2 28 07/28/2015 0931   CO2 32 09/10/2013 0758   GLUCOSE 99 07/28/2015 0931   GLUCOSE 85 09/10/2013 0758   BUN 16 07/28/2015 0931   BUN 13 09/10/2013 0758   CREATININE 0.95 07/28/2015 0931   CREATININE 0.96 12/22/2013 0857   CREATININE 0.73 09/10/2013 0758   CALCIUM 8.7* 07/28/2015 0931   CALCIUM 9.2 09/10/2013 0758   PROT 8.1 07/28/2015 0931   PROT 8.3* 09/10/2013 0758   ALBUMIN 4.2 07/28/2015 0931   ALBUMIN 3.5 09/10/2013 0758   AST 23 07/28/2015 0931   AST 23 09/10/2013 0758   ALT 24 07/28/2015 0931   ALT 28 09/10/2013 0758   ALKPHOS 70 07/28/2015 0931   ALKPHOS 78 09/10/2013 0758   BILITOT 0.8 07/28/2015 0931   BILITOT 0.6 09/10/2013 0758   GFRNONAA 58* 07/28/2015 0931   GFRNONAA 59* 12/22/2013 0857   GFRNONAA >60 09/10/2013 0758   GFRAA >60 07/28/2015 0931   GFRAA 68 12/22/2013 0857   GFRAA >60 09/10/2013 0758    No results found for: SPEP, UPEP  Lab Results  Component Value Date   WBC 6.8 07/28/2015   NEUTROABS 4.5 07/28/2015   HGB 13.1 07/28/2015   HCT 37.3 07/28/2015   MCV 92.6 07/28/2015   PLT 226  07/28/2015      Chemistry      Component Value Date/Time   NA 133* 07/28/2015 0931   NA 138 09/10/2013 0758   K 3.7 07/28/2015 0931   K 3.3* 09/10/2013 0758   CL 99* 07/28/2015 0931   CL 100 09/10/2013 0758   CO2 28 07/28/2015 0931   CO2 32 09/10/2013 0758   BUN 16 07/28/2015 0931   BUN 13 09/10/2013 0758   CREATININE 0.95 07/28/2015 0931   CREATININE 0.96 12/22/2013 0857   CREATININE 0.73 09/10/2013 0758  Component Value Date/Time   CALCIUM 8.7* 07/28/2015 0931   CALCIUM 9.2 09/10/2013 0758   ALKPHOS 70 07/28/2015 0931   ALKPHOS 78 09/10/2013 0758   AST 23 07/28/2015 0931   AST 23 09/10/2013 0758   ALT 24 07/28/2015 0931   ALT 28 09/10/2013 0758   BILITOT 0.8 07/28/2015 0931   BILITOT 0.6 09/10/2013 0758        ASSESSMENT & PLAN:  # RETROPERITONEAL LN [incidental/asymptomatic]- follicular lymphoma grade 2; likely stage II [bone marrow biopsy not done]. Patient tolerated Rituxan weekly 4 very well. We'll plan to get a PET scan/labs in about 6 weeks.  # Recent history of shingles/ recommend acyclovir prophylaxis for 3 months. Continue prophylaxis at this time.  # patient will follow-up with me in approximately 7 weeks.      Cammie Sickle, MD 08/11/2015 10:35 AM

## 2015-08-16 DIAGNOSIS — J301 Allergic rhinitis due to pollen: Secondary | ICD-10-CM | POA: Diagnosis not present

## 2015-08-23 DIAGNOSIS — J301 Allergic rhinitis due to pollen: Secondary | ICD-10-CM | POA: Diagnosis not present

## 2015-08-24 DIAGNOSIS — J301 Allergic rhinitis due to pollen: Secondary | ICD-10-CM | POA: Diagnosis not present

## 2015-08-27 ENCOUNTER — Encounter: Payer: PPO | Admitting: Internal Medicine

## 2015-08-30 DIAGNOSIS — J301 Allergic rhinitis due to pollen: Secondary | ICD-10-CM | POA: Diagnosis not present

## 2015-09-03 ENCOUNTER — Ambulatory Visit (INDEPENDENT_AMBULATORY_CARE_PROVIDER_SITE_OTHER): Payer: Medicare Other | Admitting: Internal Medicine

## 2015-09-03 ENCOUNTER — Encounter: Payer: Self-pay | Admitting: Internal Medicine

## 2015-09-03 VITALS — BP 130/76 | HR 66 | Temp 97.9°F | Resp 12 | Ht 62.75 in | Wt 150.0 lb

## 2015-09-03 DIAGNOSIS — Z0000 Encounter for general adult medical examination without abnormal findings: Secondary | ICD-10-CM | POA: Diagnosis not present

## 2015-09-03 DIAGNOSIS — I10 Essential (primary) hypertension: Secondary | ICD-10-CM

## 2015-09-03 DIAGNOSIS — G5692 Unspecified mononeuropathy of left upper limb: Secondary | ICD-10-CM | POA: Diagnosis not present

## 2015-09-03 DIAGNOSIS — E559 Vitamin D deficiency, unspecified: Secondary | ICD-10-CM | POA: Diagnosis not present

## 2015-09-03 DIAGNOSIS — R635 Abnormal weight gain: Secondary | ICD-10-CM | POA: Diagnosis not present

## 2015-09-03 DIAGNOSIS — Z7289 Other problems related to lifestyle: Secondary | ICD-10-CM | POA: Diagnosis not present

## 2015-09-03 DIAGNOSIS — E785 Hyperlipidemia, unspecified: Secondary | ICD-10-CM | POA: Diagnosis not present

## 2015-09-03 DIAGNOSIS — R7301 Impaired fasting glucose: Secondary | ICD-10-CM | POA: Diagnosis not present

## 2015-09-03 DIAGNOSIS — R200 Anesthesia of skin: Secondary | ICD-10-CM

## 2015-09-03 DIAGNOSIS — R208 Other disturbances of skin sensation: Secondary | ICD-10-CM

## 2015-09-03 LAB — COMPREHENSIVE METABOLIC PANEL
ALK PHOS: 76 U/L (ref 39–117)
ALT: 29 U/L (ref 0–35)
AST: 26 U/L (ref 0–37)
Albumin: 4.3 g/dL (ref 3.5–5.2)
BILIRUBIN TOTAL: 0.7 mg/dL (ref 0.2–1.2)
BUN: 16 mg/dL (ref 6–23)
CALCIUM: 9.8 mg/dL (ref 8.4–10.5)
CO2: 30 meq/L (ref 19–32)
Chloride: 98 mEq/L (ref 96–112)
Creatinine, Ser: 0.95 mg/dL (ref 0.40–1.20)
GFR: 61.1 mL/min (ref >60.00)
Glucose, Bld: 83 mg/dL (ref 70–99)
Potassium: 3.8 mEq/L (ref 3.5–5.1)
Sodium: 135 mEq/L (ref 135–145)
Total Protein: 8.1 g/dL (ref 6.0–8.3)

## 2015-09-03 LAB — LIPID PANEL
CHOL/HDL RATIO: 4
Cholesterol: 163 mg/dL (ref 0–200)
HDL: 37.3 mg/dL — AB (ref >39.00)
LDL Cholesterol: 96 mg/dL (ref 0–99)
NonHDL: 125.25
TRIGLYCERIDES: 146 mg/dL (ref 0.0–149.0)
VLDL: 29.2 mg/dL (ref 0.0–40.0)

## 2015-09-03 LAB — HEMOGLOBIN A1C: Hgb A1c MFr Bld: 5.9 % (ref 4.6–6.5)

## 2015-09-03 LAB — TSH: TSH: 1.29 u[IU]/mL (ref 0.35–4.50)

## 2015-09-03 LAB — VITAMIN D 25 HYDROXY (VIT D DEFICIENCY, FRACTURES): VITD: 33.38 ng/mL (ref 30.00–100.00)

## 2015-09-03 NOTE — Patient Instructions (Signed)
EMG nerve conduction studies have been ordered to workup the numbness in your left hand    Menopause is a normal process in which your reproductive ability comes to an end. This process happens gradually over a span of months to years, usually between the ages of 17 and 18. Menopause is complete when you have missed 12 consecutive menstrual periods. It is important to talk with your health care provider about some of the most common conditions that affect postmenopausal women, such as heart disease, cancer, and bone loss (osteoporosis). Adopting a healthy lifestyle and getting preventive care can help to promote your health and wellness. Those actions can also lower your chances of developing some of these common conditions. WHAT SHOULD I KNOW ABOUT MENOPAUSE? During menopause, you may experience a number of symptoms, such as:  Moderate-to-severe hot flashes.  Night sweats.  Decrease in sex drive.  Mood swings.  Headaches.  Tiredness.  Irritability.  Memory problems.  Insomnia. Choosing to treat or not to treat menopausal changes is an individual decision that you make with your health care provider. WHAT SHOULD I KNOW ABOUT HORMONE REPLACEMENT THERAPY AND SUPPLEMENTS? Hormone therapy products are effective for treating symptoms that are associated with menopause, such as hot flashes and night sweats. Hormone replacement carries certain risks, especially as you become older. If you are thinking about using estrogen or estrogen with progestin treatments, discuss the benefits and risks with your health care provider. WHAT SHOULD I KNOW ABOUT HEART DISEASE AND STROKE? Heart disease, heart attack, and stroke become more likely as you age. This may be due, in part, to the hormonal changes that your body experiences during menopause. These can affect how your body processes dietary fats, triglycerides, and cholesterol. Heart attack and stroke are both medical emergencies. There are many  things that you can do to help prevent heart disease and stroke:  Have your blood pressure checked at least every 1-2 years. High blood pressure causes heart disease and increases the risk of stroke.  If you are 43-10 years old, ask your health care provider if you should take aspirin to prevent a heart attack or a stroke.  Do not use any tobacco products, including cigarettes, chewing tobacco, or electronic cigarettes. If you need help quitting, ask your health care provider.  It is important to eat a healthy diet and maintain a healthy weight.  Be sure to include plenty of vegetables, fruits, low-fat dairy products, and lean protein.  Avoid eating foods that are high in solid fats, added sugars, or salt (sodium).  Get regular exercise. This is one of the most important things that you can do for your health.  Try to exercise for at least 150 minutes each week. The type of exercise that you do should increase your heart rate and make you sweat. This is known as moderate-intensity exercise.  Try to do strengthening exercises at least twice each week. Do these in addition to the moderate-intensity exercise.  Know your numbers.Ask your health care provider to check your cholesterol and your blood glucose. Continue to have your blood tested as directed by your health care provider. WHAT SHOULD I KNOW ABOUT CANCER SCREENING? There are several types of cancer. Take the following steps to reduce your risk and to catch any cancer development as early as possible. Breast Cancer  Practice breast self-awareness.  This means understanding how your breasts normally appear and feel.  It also means doing regular breast self-exams. Let your health care provider know about  any changes, no matter how small.  If you are 17 or older, have a clinician do a breast exam (clinical breast exam or CBE) every year. Depending on your age, family history, and medical history, it may be recommended that you also  have a yearly breast X-ray (mammogram).  If you have a family history of breast cancer, talk with your health care provider about genetic screening.  If you are at high risk for breast cancer, talk with your health care provider about having an MRI and a mammogram every year.  Breast cancer (BRCA) gene test is recommended for women who have family members with BRCA-related cancers. Results of the assessment will determine the need for genetic counseling and BRCA1 and for BRCA2 testing. BRCA-related cancers include these types:  Breast. This occurs in males or females.  Ovarian.  Tubal. This may also be called fallopian tube cancer.  Cancer of the abdominal or pelvic lining (peritoneal cancer).  Prostate.  Pancreatic. Cervical, Uterine, and Ovarian Cancer Your health care provider may recommend that you be screened regularly for cancer of the pelvic organs. These include your ovaries, uterus, and vagina. This screening involves a pelvic exam, which includes checking for microscopic changes to the surface of your cervix (Pap test).  For women ages 21-65, health care providers may recommend a pelvic exam and a Pap test every three years. For women ages 78-65, they may recommend the Pap test and pelvic exam, combined with testing for human papilloma virus (HPV), every five years. Some types of HPV increase your risk of cervical cancer. Testing for HPV may also be done on women of any age who have unclear Pap test results.  Other health care providers may not recommend any screening for nonpregnant women who are considered low risk for pelvic cancer and have no symptoms. Ask your health care provider if a screening pelvic exam is right for you.  If you have had past treatment for cervical cancer or a condition that could lead to cancer, you need Pap tests and screening for cancer for at least 20 years after your treatment. If Pap tests have been discontinued for you, your risk factors (such as  having a new sexual partner) need to be reassessed to determine if you should start having screenings again. Some women have medical problems that increase the chance of getting cervical cancer. In these cases, your health care provider may recommend that you have screening and Pap tests more often.  If you have a family history of uterine cancer or ovarian cancer, talk with your health care provider about genetic screening.  If you have vaginal bleeding after reaching menopause, tell your health care provider.  There are currently no reliable tests available to screen for ovarian cancer. Lung Cancer Lung cancer screening is recommended for adults 3-42 years old who are at high risk for lung cancer because of a history of smoking. A yearly low-dose CT scan of the lungs is recommended if you:  Currently smoke.  Have a history of at least 30 pack-years of smoking and you currently smoke or have quit within the past 15 years. A pack-year is smoking an average of one pack of cigarettes per day for one year. Yearly screening should:  Continue until it has been 15 years since you quit.  Stop if you develop a health problem that would prevent you from having lung cancer treatment. Colorectal Cancer  This type of cancer can be detected and can often be prevented.  Routine colorectal cancer screening usually begins at age 43 and continues through age 45.  If you have risk factors for colon cancer, your health care provider may recommend that you be screened at an earlier age.  If you have a family history of colorectal cancer, talk with your health care provider about genetic screening.  Your health care provider may also recommend using home test kits to check for hidden blood in your stool.  A small camera at the end of a tube can be used to examine your colon directly (sigmoidoscopy or colonoscopy). This is done to check for the earliest forms of colorectal cancer.  Direct examination of  the colon should be repeated every 5-10 years until age 41. However, if early forms of precancerous polyps or small growths are found or if you have a family history or genetic risk for colorectal cancer, you may need to be screened more often. Skin Cancer  Check your skin from head to toe regularly.  Monitor any moles. Be sure to tell your health care provider:  About any new moles or changes in moles, especially if there is a change in a mole's shape or color.  If you have a mole that is larger than the size of a pencil eraser.  If any of your family members has a history of skin cancer, especially at a young age, talk with your health care provider about genetic screening.  Always use sunscreen. Apply sunscreen liberally and repeatedly throughout the day.  Whenever you are outside, protect yourself by wearing long sleeves, pants, a wide-brimmed hat, and sunglasses. WHAT SHOULD I KNOW ABOUT OSTEOPOROSIS? Osteoporosis is a condition in which bone destruction happens more quickly than new bone creation. After menopause, you may be at an increased risk for osteoporosis. To help prevent osteoporosis or the bone fractures that can happen because of osteoporosis, the following is recommended:  If you are 63-29 years old, get at least 1,000 mg of calcium and at least 600 mg of vitamin D per day.  If you are older than age 65 but younger than age 60, get at least 1,200 mg of calcium and at least 600 mg of vitamin D per day.  If you are older than age 42, get at least 1,200 mg of calcium and at least 800 mg of vitamin D per day. Smoking and excessive alcohol intake increase the risk of osteoporosis. Eat foods that are rich in calcium and vitamin D, and do weight-bearing exercises several times each week as directed by your health care provider. WHAT SHOULD I KNOW ABOUT HOW MENOPAUSE AFFECTS [place]? Depression may occur at any age, but it is more common as you become older. Common  symptoms of depression include:  Low or sad mood.  Changes in sleep patterns.  Changes in appetite or eating patterns.  Feeling an overall lack of motivation or enjoyment of activities that you previously enjoyed.  Frequent crying spells. Talk with your health care provider if you think that you are experiencing depression. WHAT SHOULD I KNOW ABOUT IMMUNIZATIONS? It is important that you get and maintain your immunizations. These include:  Tetanus, diphtheria, and pertussis (Tdap) booster vaccine.  Influenza every year before the flu season begins.  Pneumonia vaccine.  Shingles vaccine. Your health care provider may also recommend other immunizations.   This information is not intended to replace advice given to you by your health care provider. Make sure you discuss any questions you have with your health care  provider.   Document Released: 06/23/2005 Document Revised: 05/22/2014 Document Reviewed: 01/01/2014 Elsevier Interactive Patient Education [employer].

## 2015-09-03 NOTE — Progress Notes (Signed)
Patient ID: [name], female    DOB: Mar 05, 1941  Age: 75 y.o. MRN: [id number]  The patient is here for annual Medicare wellness examination and management of other chronic and acute problems  Diagnosed with follicular lymphoma diagnosed in  Nov 2016 after incidental finding of  retroperitoneal lymphadenopathy   Was found .  Shw ia  s/p rituximab  finsihed in March .PET scan pending   Shingles prophylkaxis with acyclvir   recommedned for 3 more months      Mammogram 2015 Colon 2009 dexa 2015 Tdap 2014    The risk factors are reflected in the social history.  The roster of all physicians providing medical care to patient - is listed in the Snapshot section of the chart.  Activities of daily living:  The patient is 100% independent in all ADLs: dressing, toileting, feeding as well as independent mobility  Home safety : The patient has smoke detectors in the home. They wear seatbelts.  There are no firearms at home. There is no violence in the home.   There is no risks for hepatitis, STDs or HIV. There is no   history of blood transfusion. They have no travel history to infectious disease endemic areas of the world.  The patient has seen their dentist in the last six month. They have seen their eye doctor in the last year. They admit to slight hearing difficulty with regard to whispered voices and some television programs.  They have deferred audiologic testing in the last year.  They do not  have excessive sun exposure. Discussed the need for sun protection: hats, long sleeves and use of sunscreen if there is significant sun exposure.   Diet: the importance of a healthy diet is discussed. They do have a healthy diet.  The benefits of regular aerobic exercise were discussed. She walks 4 times per week ,  20 minutes.   Depression screen: there are no signs or vegative symptoms of depression- irritability, change in appetite, anhedonia, sadness/tearfullness.  Cognitive  assessment: the patient manages all their financial and personal affairs and is actively engaged. They could relate day,date,year and events; recalled 2/3 objects at 3 minutes; performed clock-face test normally.  The following portions of the patient's history were reviewed and updated as appropriate: allergies, current medications, past family history, past medical history,  past surgical history, past social history  and problem list.  Visual acuity was not assessed per patient preference since she has regular follow up with her ophthalmologist. Hearing and body mass index were assessed and reviewed.   During the course of the visit the patient was educated and counseled about appropriate screening and preventive services including : fall prevention , diabetes screening, nutrition counseling, colorectal cancer screening, and recommended immunizations.    CC: The primary encounter diagnosis was Neuropathy, arm, left. Diagnoses of Other problems related to lifestyle, Impaired fasting blood sugar, Hyperlipidemia, Weight gain, Vitamin D deficiency, Medicare annual wellness visit, subsequent, Numbness of left hand, Hyperlipidemia LDL goal <100, and Essential hypertension were also pertinent to this visit.  Had an episode of diverticultis in October . Resolved without hospitalization.  Has been having left hand numbness involving thumb and 4 fingers, aggravated by driving.  She has occasional pain radiating up to shoulder on same side, and denies neck pain.    History Rakayla has a past medical history of Bronchitis; Hypertension; Hyperlipidemia; Hemorrhoids; Heart murmur; GERD (gastroesophageal reflux disease); History of kidney stones; History of colon polyps; Primary squamous cell carcinoma of  chest wall ([place]) (2004); Basal cell carcinoma of nose; and History of mammogram (2016).   She has past surgical history that includes Tonsillectomy (1971); Tubal ligation (1975); Abdominal hysterectomy (1984);  Vaginal prolapse repair (July 2001); Cholecystectomy (06-01-03); Lithotripsy (2005); Bladder suspension (2010); local exicsion skin cancer (2004); and Colonoscopy (2003).   Her family history includes Cancer in her brother and mother; Stroke in her brother, father, and mother.She reports that she has never smoked. She has never used smokeless tobacco. She reports that she does not drink alcohol or use illicit drugs.  Outpatient Prescriptions Prior to Visit  Medication Sig Dispense Refill  . atorvastatin (LIPITOR) 20 MG tablet Take 20 mg by mouth daily.    . budesonide-formoterol (SYMBICORT) 80-4.5 MCG/ACT inhaler Inhale 2 puffs into the lungs 2 (two) times daily.    Marland Kitchen escitalopram (LEXAPRO) 10 MG tablet TAKE ONE (1) TABLET BY MOUTH EVERY DAY 90 tablet 4  . hyoscyamine (LEVBID) 0.375 MG 12 hr tablet TAKE ONE (1) TABLET BY MOUTH EVERY DAY (Patient taking differently: TAKE ONE (0.5) TABLET BY MOUTH EVERY DAY) 90 tablet 2  . losartan (COZAAR) 100 MG tablet TAKE ONE (1) TABLET BY MOUTH EVERY DAY 90 tablet 1  . metoprolol succinate (TOPROL-XL) 25 MG 24 hr tablet TAKE ONE (1) TABLET BY MOUTH EVERY DAY 90 tablet 1  . potassium chloride SA (K-DUR,KLOR-CON) 20 MEQ tablet TAKE ONE (1) TABLET EACH DAY 30 tablet 3  . Spacer/Aero-Holding Chambers (AEROCHAMBER MV) inhaler Use as instructed 1 each 0  . triamterene-hydrochlorothiazide (MAXZIDE-25) 37.5-25 MG per tablet Take 0.5 tablets by mouth daily. 90 tablet 3  . zoster vaccine live, PF, (ZOSTAVAX) 60454 UNT/0.65ML injection Inject 19,400 Units into the skin once. (Patient not taking: Reported on 09/03/2015) 1 each 0  . acyclovir (ZOVIRAX) 400 MG tablet Take 1 tablet (400 mg total) by mouth 2 (two) times daily. (Patient not taking: Reported on 09/03/2015) 120 tablet 2   No facility-administered medications prior to visit.    Review of Systems   Patient denies headache, fevers, malaise, unintentional weight loss, skin rash, eye pain, sinus congestion and sinus  pain, sore throat, dysphagia,  hemoptysis , cough, dyspnea, wheezing, chest pain, palpitations, orthopnea, edema, abdominal pain, nausea, melena, diarrhea, constipation, flank pain, dysuria, hematuria, urinary  Frequency, nocturia, numbness, tingling, seizures,  Focal weakness, Loss of consciousness,  Tremor, insomnia, depression, anxiety, and suicidal ideation.     Objective:  BP 130/76 mmHg  Pulse 66  Temp(Src) 97.9 F (36.6 C) (Oral)  Resp 12  Ht 5' 2.75" (1.594 m)  Wt 150 lb (68.04 kg)  BMI 26.78 kg/m2  SpO2 95%  Physical Exam  General appearance: alert, cooperative and appears stated age Head: Normocephalic, without obvious abnormality, atraumatic Eyes: conjunctivae/corneas clear. PERRL, EOM's intact. Fundi benign. Ears: normal TM's and external ear canals both ears Nose: Nares normal. Septum midline. Mucosa normal. No drainage or sinus tenderness. Throat: lips, mucosa, and tongue normal; teeth and gums normal Neck: no adenopathy, no carotid bruit, no JVD, supple, symmetrical, trachea midline and thyroid not enlarged, symmetric, no tenderness/mass/nodules Lungs: clear to auscultation bilaterally Breasts: normal appearance, no masses or tenderness Heart: regular rate and rhythm, S1, S2 normal, no murmur, click, rub or gallop Abdomen: soft, non-tender; bowel sounds normal; no masses,  no organomegaly Extremities: extremities normal, atraumatic, no cyanosis or edema Pulses: 2+ and symmetric Skin: Skin color, texture, turgor normal. No rashes or lesions Neurologic: Alert and oriented X 3, normal strength and tone. Normal symmetric reflexes. Normal coordination  and gait.    Assessment & Plan:   Problem List Items Addressed This Visit    Hyperlipidemia LDL goal <100    LDL and triglycerides are at goal on current medications. She is alternating between lipitor and RYR daily to avoid leg cramps,  No changes today   Lab Results  Component Value Date   CHOL 163 09/03/2015   HDL  37.30* 09/03/2015   LDLCALC 96 09/03/2015   TRIG 146.0 09/03/2015   CHOLHDL 4 09/03/2015           Essential hypertension    Well controlled on current regimen. Renal function stable, no changes today.      Medicare annual wellness visit, subsequent    Annual Medicare wellness  exam was done as well as a comprehensive physical exam and management of acute and chronic conditions .  During the course of the visit the patient was educated and counseled about appropriate screening and preventive services including : fall prevention , diabetes screening, nutrition counseling, colorectal cancer screening, and recommended immunizations.  Printed recommendations for health maintenance screenings was given.       Numbness of left hand    Recommended EMG/nerve conduction studies to distinguish CTS from spinal stenosis.        Other Visit Diagnoses    Neuropathy, arm, left    -  Primary    Relevant Orders    TSH (Completed)    NCV with EMG(electromyography)    Other problems related to lifestyle        Relevant Orders    HIV antibody (Completed)    Hepatitis C antibody (Completed)    Impaired fasting blood sugar        Relevant Orders    Comprehensive metabolic panel (Completed)    Hemoglobin A1c (Completed)    Hyperlipidemia        Relevant Orders    Lipid panel (Completed)    Weight gain        Relevant Orders    TSH (Completed)    Vitamin D deficiency        Relevant Orders    VITAMIN D 25 Hydroxy (Vit-D Deficiency, Fractures) (Completed)       I have discontinued Ms. Neenan's acyclovir. I am also having her maintain her AEROCHAMBER MV, zoster vaccine live (PF), triamterene-hydrochlorothiazide, escitalopram, atorvastatin, potassium chloride SA, losartan, hyoscyamine, metoprolol succinate, and budesonide-formoterol.  No orders of the defined types were placed in this encounter.    Medications Discontinued During This Encounter  Medication Reason  . acyclovir  (ZOVIRAX) 400 MG tablet Patient Preference    Follow-up: No Follow-up on file.   [name], MD

## 2015-09-03 NOTE — Progress Notes (Signed)
Pre-visit discussion using our clinic review tool. No additional management support is needed unless otherwise documented below in the visit note.  

## 2015-09-04 LAB — HEPATITIS C ANTIBODY: HCV Ab: NEGATIVE

## 2015-09-04 LAB — HIV ANTIBODY (ROUTINE TESTING W REFLEX): HIV 1&2 Ab, 4th Generation: NONREACTIVE

## 2015-09-05 DIAGNOSIS — Z1239 Encounter for other screening for malignant neoplasm of breast: Secondary | ICD-10-CM | POA: Insufficient documentation

## 2015-09-05 DIAGNOSIS — Z0000 Encounter for general adult medical examination without abnormal findings: Secondary | ICD-10-CM | POA: Insufficient documentation

## 2015-09-05 DIAGNOSIS — R200 Anesthesia of skin: Secondary | ICD-10-CM | POA: Insufficient documentation

## 2015-09-05 NOTE — Assessment & Plan Note (Signed)

## 2015-09-05 NOTE — Assessment & Plan Note (Signed)
LDL and triglycerides are at goal on current medications. She is alternating between lipitor and RYR daily to avoid leg cramps,  No changes today   Lab Results  Component Value Date   CHOL 163 09/03/2015   HDL 37.30* 09/03/2015   LDLCALC 96 09/03/2015   TRIG 146.0 09/03/2015   CHOLHDL 4 09/03/2015

## 2015-09-05 NOTE — Assessment & Plan Note (Signed)
Well controlled on current regimen. Renal function stable, no changes today. 

## 2015-09-05 NOTE — Assessment & Plan Note (Signed)
Recommended EMG/nerve conduction studies to distinguish CTS from spinal stenosis.

## 2015-09-06 ENCOUNTER — Other Ambulatory Visit: Payer: Self-pay | Admitting: Internal Medicine

## 2015-09-06 ENCOUNTER — Encounter: Payer: Self-pay | Admitting: *Deleted

## 2015-09-06 DIAGNOSIS — J301 Allergic rhinitis due to pollen: Secondary | ICD-10-CM | POA: Diagnosis not present

## 2015-09-06 NOTE — Telephone Encounter (Signed)
Okay to refill?Listed as a historical med. Last refilled by cancer doctor Cammie Sickle, MD). Pt was here on 09/03/15.

## 2015-09-08 NOTE — Telephone Encounter (Signed)
Caller name: Keiosha Caplan Relationship to patient: patient Can be reached: [phone]   Reason for call: pt called to request refill on Lipitor ([place])

## 2015-09-10 NOTE — Telephone Encounter (Signed)
refilled 

## 2015-09-13 DIAGNOSIS — J301 Allergic rhinitis due to pollen: Secondary | ICD-10-CM | POA: Diagnosis not present

## 2015-09-22 ENCOUNTER — Ambulatory Visit
Admission: RE | Admit: 2015-09-22 | Discharge: 2015-09-22 | Disposition: A | Discharge status: Home / Self Care | Payer: Medicare Other | Source: Ambulatory Visit | Attending: Internal Medicine | Admitting: Internal Medicine

## 2015-09-22 ENCOUNTER — Inpatient Hospital Stay: Payer: Medicare Other | Attending: Internal Medicine

## 2015-09-22 DIAGNOSIS — C8218 Follicular lymphoma grade II, lymph nodes of multiple sites: Secondary | ICD-10-CM | POA: Insufficient documentation

## 2015-09-22 DIAGNOSIS — Z87442 Personal history of urinary calculi: Secondary | ICD-10-CM | POA: Diagnosis not present

## 2015-09-22 DIAGNOSIS — Z8619 Personal history of other infectious and parasitic diseases: Secondary | ICD-10-CM | POA: Diagnosis not present

## 2015-09-22 DIAGNOSIS — I10 Essential (primary) hypertension: Secondary | ICD-10-CM | POA: Diagnosis not present

## 2015-09-22 DIAGNOSIS — K219 Gastro-esophageal reflux disease without esophagitis: Secondary | ICD-10-CM | POA: Insufficient documentation

## 2015-09-22 DIAGNOSIS — E785 Hyperlipidemia, unspecified: Secondary | ICD-10-CM | POA: Insufficient documentation

## 2015-09-22 DIAGNOSIS — C8280 Other types of follicular lymphoma, unspecified site: Secondary | ICD-10-CM | POA: Diagnosis not present

## 2015-09-22 DIAGNOSIS — Z79899 Other long term (current) drug therapy: Secondary | ICD-10-CM | POA: Diagnosis not present

## 2015-09-22 DIAGNOSIS — C8293 Follicular lymphoma, unspecified, intra-abdominal lymph nodes: Secondary | ICD-10-CM | POA: Diagnosis not present

## 2015-09-22 DIAGNOSIS — Z8052 Family history of malignant neoplasm of bladder: Secondary | ICD-10-CM | POA: Diagnosis not present

## 2015-09-22 DIAGNOSIS — Z85828 Personal history of other malignant neoplasm of skin: Secondary | ICD-10-CM | POA: Insufficient documentation

## 2015-09-22 DIAGNOSIS — Z803 Family history of malignant neoplasm of breast: Secondary | ICD-10-CM | POA: Insufficient documentation

## 2015-09-22 DIAGNOSIS — R011 Cardiac murmur, unspecified: Secondary | ICD-10-CM | POA: Diagnosis not present

## 2015-09-22 DIAGNOSIS — I700 Atherosclerosis of aorta: Secondary | ICD-10-CM | POA: Insufficient documentation

## 2015-09-22 DIAGNOSIS — K5730 Diverticulosis of large intestine without perforation or abscess without bleeding: Secondary | ICD-10-CM | POA: Insufficient documentation

## 2015-09-22 DIAGNOSIS — Z86010 Personal history of colonic polyps: Secondary | ICD-10-CM | POA: Insufficient documentation

## 2015-09-22 DIAGNOSIS — N281 Cyst of kidney, acquired: Secondary | ICD-10-CM | POA: Diagnosis not present

## 2015-09-22 LAB — CBC WITH DIFFERENTIAL/PLATELET
BASOS PCT: 1 %
Basophils Absolute: 0 10*3/uL (ref 0–0.1)
Eosinophils Absolute: 0.1 10*3/uL (ref 0–0.7)
Eosinophils Relative: 2 %
HEMATOCRIT: 38.2 % (ref 35.0–47.0)
HEMOGLOBIN: 13.4 g/dL (ref 12.0–16.0)
LYMPHS ABS: 1.9 10*3/uL (ref 1.0–3.6)
Lymphocytes Relative: 29 %
MCH: 32.7 pg (ref 26.0–34.0)
MCHC: 34.9 g/dL (ref 32.0–36.0)
MCV: 93.6 fL (ref 80.0–100.0)
MONOS PCT: 11 %
Monocytes Absolute: 0.7 10*3/uL (ref 0.2–0.9)
NEUTROS ABS: 3.7 10*3/uL (ref 1.4–6.5)
NEUTROS PCT: 57 %
Platelets: 254 10*3/uL (ref 150–440)
RBC: 4.09 MIL/uL (ref 3.80–5.20)
RDW: 13.4 % (ref 11.5–14.5)
WBC: 6.5 10*3/uL (ref 3.6–11.0)

## 2015-09-22 LAB — COMPREHENSIVE METABOLIC PANEL
ALBUMIN: 4.7 g/dL (ref 3.5–5.0)
ALK PHOS: 86 U/L (ref 38–126)
ALT: 27 U/L (ref 14–54)
ANION GAP: 6 (ref 5–15)
AST: 28 U/L (ref 15–41)
BILIRUBIN TOTAL: 1.1 mg/dL (ref 0.3–1.2)
BUN: 17 mg/dL (ref 6–20)
CALCIUM: 9 mg/dL (ref 8.9–10.3)
CO2: 27 mmol/L (ref 22–32)
CREATININE: 1.09 mg/dL — AB (ref 0.44–1.00)
Chloride: 101 mmol/L (ref 101–111)
GFR calc Af Amer: 57 mL/min — ABNORMAL LOW (ref >60)
GFR calc non Af Amer: 49 mL/min — ABNORMAL LOW (ref >60)
GLUCOSE: 102 mg/dL — AB (ref 65–99)
Potassium: 3.6 mmol/L (ref 3.5–5.1)
Sodium: 134 mmol/L — ABNORMAL LOW (ref 135–145)
TOTAL PROTEIN: 8.1 g/dL (ref 6.5–8.1)

## 2015-09-22 LAB — GLUCOSE, CAPILLARY: Glucose-Capillary: 94 mg/dL (ref 65–99)

## 2015-09-22 LAB — LACTATE DEHYDROGENASE: LDH: 176 U/L (ref 98–192)

## 2015-09-22 MED ORDER — FLUDEOXYGLUCOSE F - 18 (FDG) INJECTION
12.3000 | Freq: Once | INTRAVENOUS | Status: AC | PRN
  Administered 2015-09-22: 12.3 via INTRAVENOUS

## 2015-09-27 DIAGNOSIS — J301 Allergic rhinitis due to pollen: Secondary | ICD-10-CM | POA: Diagnosis not present

## 2015-09-29 ENCOUNTER — Telehealth: Payer: Self-pay | Admitting: Internal Medicine

## 2015-09-29 ENCOUNTER — Inpatient Hospital Stay (HOSPITAL_BASED_OUTPATIENT_CLINIC_OR_DEPARTMENT_OTHER): Payer: Medicare Other | Admitting: Internal Medicine

## 2015-09-29 VITALS — BP 141/93 | HR 65 | Temp 97.9°F | Resp 20 | Wt 151.3 lb

## 2015-09-29 DIAGNOSIS — C8280 Other types of follicular lymphoma, unspecified site: Secondary | ICD-10-CM

## 2015-09-29 DIAGNOSIS — E785 Hyperlipidemia, unspecified: Secondary | ICD-10-CM

## 2015-09-29 DIAGNOSIS — C8218 Follicular lymphoma grade II, lymph nodes of multiple sites: Secondary | ICD-10-CM | POA: Diagnosis not present

## 2015-09-29 DIAGNOSIS — Z85828 Personal history of other malignant neoplasm of skin: Secondary | ICD-10-CM

## 2015-09-29 DIAGNOSIS — K219 Gastro-esophageal reflux disease without esophagitis: Secondary | ICD-10-CM

## 2015-09-29 DIAGNOSIS — R011 Cardiac murmur, unspecified: Secondary | ICD-10-CM

## 2015-09-29 DIAGNOSIS — Z79899 Other long term (current) drug therapy: Secondary | ICD-10-CM | POA: Diagnosis not present

## 2015-09-29 DIAGNOSIS — Z86010 Personal history of colonic polyps: Secondary | ICD-10-CM

## 2015-09-29 DIAGNOSIS — Z803 Family history of malignant neoplasm of breast: Secondary | ICD-10-CM

## 2015-09-29 DIAGNOSIS — Z87442 Personal history of urinary calculi: Secondary | ICD-10-CM

## 2015-09-29 DIAGNOSIS — I10 Essential (primary) hypertension: Secondary | ICD-10-CM | POA: Diagnosis not present

## 2015-09-29 DIAGNOSIS — Z8619 Personal history of other infectious and parasitic diseases: Secondary | ICD-10-CM

## 2015-09-29 DIAGNOSIS — Z8052 Family history of malignant neoplasm of bladder: Secondary | ICD-10-CM

## 2015-09-29 NOTE — Progress Notes (Signed)
[facility] OFFICE PROGRESS NOTE  [facility] Care Team: [name], MD as PCP - General (Internal Medicine) Robert Bellow, MD (General Surgery)   SUMMARY OF ONCOLOGIC HISTORY:  # NOV [id number]- FOLLICULAR LYMPHOMA G-2; [s/p Bx- RETROPERITONEAL/LEFT Peri-aortic LN ~ 2.5CM [incidental on CT scan in 2016; CT- Nov 2014- ~1CM]/ PET 2016-Left Peri-aortic LN; Suv ~15; Ext Iliac LN 6 mm; Feb 2017- Unchanged LN; START Rituxan q week x4 [finished march 15th]  # Hx of Shingles [Sep 2016]-   INTERVAL HISTORY: Whitney very pleasant 75 year old female [facility] with history of follicular lymphoma grade 2 retroperitoneal is currently status post 4 infusions of rituximab single agent is here for follow-up.  Molina finished approximately 2 months ago. She is here to review the results of her restaging PET scan.   M[facility]ina complains of no back pain or any worsening abdominal pain. Continues to deny any weight loss or any night sweats or fevers. Denies any skin rash or reactivation of shingles. She is currently offered acyclovir. No nausea no vomiting. Appetite is good. No diarrhea or constipation.   Molina had been fatigued while getting the Rituxan infusion; this is currently improved/resolved.  REVIEW OF SYSTEMS:  Whitney complete 10 point review of system is done which is negative except mentioned above/history of present illness.   PAST MEDICAL HISTORY :  Past Medical History  Diagnosis Date  . Bronchitis   . Hypertension   . Hyperlipidemia   . Hemorrhoids   . Heart murmur   . GERD (gastroesophageal reflux disease)   . History of kidney stones   . History of colon polyps   . Primary squamous cell carcinoma of chest wall ([place]) 2004    removed at [facility]  . Basal cell carcinoma of nose   . History of mammogram 2016    PAST SURGICAL HISTORY :   Past Surgical History  Procedure Laterality Date  . Tonsillectomy  1971  . Tubal ligation  1975  . Abdominal hysterectomy  1984  . Vaginal prolapse  repair  July 2001    Pelvic Prolapse  . Cholecystectomy  06-01-03  . Lithotripsy  2005  . Bladder suspension  2010  . Local exicsion skin cancer  2004    squamous cell of chest wall. left chest  . Colonoscopy  2003    FAMILY HISTORY :   Family History  Problem Relation Age of Onset  . Cancer Mother     breast   . Stroke Mother   . Stroke Father   . Cancer Brother     bladder  . Stroke Brother     SOCIAL HISTORY:   Social History  Substance Use Topics  . Smoking status: Never Smoker   . Smokeless tobacco: Never Used  . Alcohol Use: No    ALLERGIES:  has No Known Allergies.  MEDICATIONS:  Current Outpatient Prescriptions  Medication Sig Dispense Refill  . atorvastatin (LIPITOR) 20 MG tablet Take 20 mg by mouth daily.    . budesonide-formoterol (SYMBICORT) 80-4.5 MCG/ACT inhaler Inhale 2 puffs into the lungs 2 (two) times daily.    Marland Kitchen escitalopram (LEXAPRO) 10 MG tablet TAKE ONE (1) TABLET BY MOUTH EVERY DAY 90 tablet 4  . losartan (COZAAR) 100 MG tablet TAKE ONE (1) TABLET BY MOUTH EVERY DAY 90 tablet 1  . metoprolol succinate (TOPROL-XL) 25 MG 24 hr tablet TAKE ONE (1) TABLET BY MOUTH EVERY DAY 90 tablet 1  . potassium chloride SA (K-DUR,KLOR-CON) 20 MEQ tablet TAKE ONE (  1) TABLET EACH DAY 30 tablet 3  . simvastatin (ZOCOR) 40 MG tablet TAKE ONE TABLET EACH EVENING 90 tablet 1  . Spacer/Aero-Holding Chambers (AEROCHAMBER MV) inhaler Use as instructed 1 each 0  . triamterene-hydrochlorothiazide (MAXZIDE-25) 37.5-25 MG per tablet Take 0.5 tablets by mouth daily. 90 tablet 3  . zoster vaccine live, PF, (ZOSTAVAX) 16109 UNT/0.65ML injection Inject 19,400 Units into the skin once. 1 each 0  . hyoscyamine (LEVBID) 0.375 MG 12 hr tablet TAKE ONE (1) TABLET BY MOUTH EVERY DAY (Molina taking differently: TAKE ONE (0.5) TABLET BY MOUTH EVERY DAY) 90 tablet 2   No current facility-administered medications for this visit.    PHYSICAL EXAMINATION: ECOG PERFORMANCE STATUS: 0 -  Asymptomatic  BP 141/93 mmHg  Pulse 65  Temp(Src) 97.9 F (36.6 C) (Tympanic)  Resp 20  Wt 151 lb 5.5 oz (68.65 kg)  Filed Weights   09/29/15 1209  Weight: 151 lb 5.5 oz (68.65 kg)   GENERAL: Well-nourished well-developed; Alert, no distress and comfortable. Accompanied her husband. EYES: no pallor or icterus OROPHARYNX: no thrush or ulceration; good dentition  NECK: supple, no masses felt LYMPH: no palpable lymphadenopathy in the cervical, axillary or inguinal regions LUNGS: clear to auscultation and No wheeze or crackles HEART/CVS: regular rate & rhythm and no murmurs; No lower extremity edema ABDOMEN: abdomen soft, non-tender and normal bowel sounds Musculoskeletal:no cyanosis of digits and no clubbing  PSYCH: alert & oriented x 3 with fluent speech NEURO: no focal motor/sensory deficits SKIN: no rashes or significant lesions  LABORATORY DATA:  I have reviewed the data as listed    Component Value Date/Time   NA 134* 09/22/2015 0826   NA 138 09/10/2013 0758   K 3.6 09/22/2015 0826   K 3.3* 09/10/2013 0758   CL 101 09/22/2015 0826   CL 100 09/10/2013 0758   CO2 27 09/22/2015 0826   CO2 32 09/10/2013 0758   GLUCOSE 102* 09/22/2015 0826   GLUCOSE 85 09/10/2013 0758   BUN 17 09/22/2015 0826   BUN 13 09/10/2013 0758   CREATININE 1.09* 09/22/2015 0826   CREATININE 0.96 12/22/2013 0857   CREATININE 0.73 09/10/2013 0758   CALCIUM 9.0 09/22/2015 0826   CALCIUM 9.2 09/10/2013 0758   PROT 8.1 09/22/2015 0826   PROT 8.3* 09/10/2013 0758   ALBUMIN 4.7 09/22/2015 0826   ALBUMIN 3.5 09/10/2013 0758   AST 28 09/22/2015 0826   AST 23 09/10/2013 0758   ALT 27 09/22/2015 0826   ALT 28 09/10/2013 0758   ALKPHOS 86 09/22/2015 0826   ALKPHOS 78 09/10/2013 0758   BILITOT 1.1 09/22/2015 0826   BILITOT 0.6 09/10/2013 0758   GFRNONAA 49* 09/22/2015 0826   GFRNONAA 59* 12/22/2013 0857   GFRNONAA >60 09/10/2013 0758   GFRAA 57* 09/22/2015 0826   GFRAA 68 12/22/2013 0857    GFRAA >60 09/10/2013 0758    No results found for: SPEP, UPEP  Lab Results  Component Value Date   WBC 6.5 09/22/2015   NEUTROABS 3.7 09/22/2015   HGB 13.4 09/22/2015   HCT 38.2 09/22/2015   MCV 93.6 09/22/2015   PLT 254 09/22/2015      Chemistry      Component Value Date/Time   NA 134* 09/22/2015 0826   NA 138 09/10/2013 0758   K 3.6 09/22/2015 0826   K 3.3* 09/10/2013 0758   CL 101 09/22/2015 0826   CL 100 09/10/2013 0758   CO2 27 09/22/2015 0826   CO2 32 09/10/2013 0758  BUN 17 09/22/2015 0826   BUN 13 09/10/2013 0758   CREATININE 1.09* 09/22/2015 0826   CREATININE 0.96 12/22/2013 0857   CREATININE 0.73 09/10/2013 0758      Component Value Date/Time   CALCIUM 9.0 09/22/2015 0826   CALCIUM 9.2 09/10/2013 0758   ALKPHOS 86 09/22/2015 0826   ALKPHOS 78 09/10/2013 0758   AST 28 09/22/2015 0826   AST 23 09/10/2013 0758   ALT 27 09/22/2015 0826   ALT 28 09/10/2013 0758   BILITOT 1.1 09/22/2015 0826   BILITOT 0.6 09/10/2013 0758        ASSESSMENT & PLAN:  # RETROPERITONEAL LN [incidental/asymptomatic]- follicular lymphoma grade 2; likely stage II; Molina tolerated Rituxan weekly 4 Very well. Restaging PET scan shows significant improvement of the lymphadenopathy in the retroperitoneal lymphadenopathy/ pelvic adenopathy. I reviewed the images myself and with the Molina and family. Whitney copy of this report was given.  I again reviewed with the Molina and her husband that she should have Whitney good continued response. However if she has any unusual weight loss recurrent fevers or new lumps or bumps or night sweats she should contact us right away. They agree.   # Prior history of shingles/ prior to rituximab infusion- finished acyclovir Whitney month ago.  # [facility] follow-up with me approximately 4 months with labs; we'll likely get Whitney scan in 8 months/surveillance.  # 25 minutes face-to-face with the Molina discussing the above plan of care; more than 50% of time spent  on prognosis/ natural history; counseling and coordination.     Cammie Sickle, MD 09/29/2015 12:33 PM

## 2015-09-29 NOTE — Telephone Encounter (Signed)
Her EMG nerve conduction study confirmed carpal tunnel syndrome of the left wrist and she is advised to wear wrist splints at night for 1-2 months.  Does she have solid support wrist splints?

## 2015-09-29 NOTE — Telephone Encounter (Signed)
Left message for patient to return call to office. 

## 2015-09-29 NOTE — Progress Notes (Signed)
Patient ambulates without assistance, brought to exam room 4.  Patient denies pain or discomfort.  Vitals documented, medication record updated, information provided by patient.

## 2015-10-04 DIAGNOSIS — J301 Allergic rhinitis due to pollen: Secondary | ICD-10-CM | POA: Diagnosis not present

## 2015-10-05 NOTE — Telephone Encounter (Signed)
Patient has solid wrist supports and is aware to wear for next 2 months.

## 2015-10-18 DIAGNOSIS — J301 Allergic rhinitis due to pollen: Secondary | ICD-10-CM | POA: Diagnosis not present

## 2015-10-19 ENCOUNTER — Other Ambulatory Visit: Payer: Self-pay | Admitting: Internal Medicine

## 2015-11-01 DIAGNOSIS — J301 Allergic rhinitis due to pollen: Secondary | ICD-10-CM | POA: Diagnosis not present

## 2015-11-08 DIAGNOSIS — J301 Allergic rhinitis due to pollen: Secondary | ICD-10-CM | POA: Diagnosis not present

## 2015-11-15 ENCOUNTER — Other Ambulatory Visit: Payer: Self-pay | Admitting: Internal Medicine

## 2015-11-15 DIAGNOSIS — J301 Allergic rhinitis due to pollen: Secondary | ICD-10-CM | POA: Diagnosis not present

## 2015-11-19 DIAGNOSIS — J301 Allergic rhinitis due to pollen: Secondary | ICD-10-CM | POA: Diagnosis not present

## 2015-11-22 DIAGNOSIS — J301 Allergic rhinitis due to pollen: Secondary | ICD-10-CM | POA: Diagnosis not present

## 2015-11-29 DIAGNOSIS — J301 Allergic rhinitis due to pollen: Secondary | ICD-10-CM | POA: Diagnosis not present

## 2015-12-06 DIAGNOSIS — J301 Allergic rhinitis due to pollen: Secondary | ICD-10-CM | POA: Diagnosis not present

## 2015-12-13 DIAGNOSIS — J301 Allergic rhinitis due to pollen: Secondary | ICD-10-CM | POA: Diagnosis not present

## 2015-12-20 DIAGNOSIS — J301 Allergic rhinitis due to pollen: Secondary | ICD-10-CM | POA: Diagnosis not present

## 2015-12-27 DIAGNOSIS — J301 Allergic rhinitis due to pollen: Secondary | ICD-10-CM | POA: Diagnosis not present

## 2016-01-03 DIAGNOSIS — J301 Allergic rhinitis due to pollen: Secondary | ICD-10-CM | POA: Diagnosis not present

## 2016-01-07 ENCOUNTER — Other Ambulatory Visit: Payer: Self-pay

## 2016-01-07 ENCOUNTER — Other Ambulatory Visit: Payer: Self-pay | Admitting: Internal Medicine

## 2016-01-07 MED ORDER — LOSARTAN POTASSIUM 100 MG PO TABS: ORAL_TABLET | ORAL | 1 refills | Status: DC

## 2016-01-10 ENCOUNTER — Ambulatory Visit (INDEPENDENT_AMBULATORY_CARE_PROVIDER_SITE_OTHER): Payer: Medicare Other | Admitting: Internal Medicine

## 2016-01-10 ENCOUNTER — Encounter: Payer: Self-pay | Admitting: Internal Medicine

## 2016-01-10 VITALS — BP 132/78 | HR 56 | Temp 98.0°F | Resp 12 | Ht 62.75 in | Wt 149.5 lb

## 2016-01-10 DIAGNOSIS — I10 Essential (primary) hypertension: Secondary | ICD-10-CM

## 2016-01-10 DIAGNOSIS — E785 Hyperlipidemia, unspecified: Secondary | ICD-10-CM

## 2016-01-10 DIAGNOSIS — Z23 Encounter for immunization: Secondary | ICD-10-CM

## 2016-01-10 DIAGNOSIS — J301 Allergic rhinitis due to pollen: Secondary | ICD-10-CM | POA: Diagnosis not present

## 2016-01-10 DIAGNOSIS — R109 Unspecified abdominal pain: Secondary | ICD-10-CM | POA: Diagnosis not present

## 2016-01-10 DIAGNOSIS — M545 Low back pain, unspecified: Secondary | ICD-10-CM

## 2016-01-10 DIAGNOSIS — R7303 Prediabetes: Secondary | ICD-10-CM | POA: Diagnosis not present

## 2016-01-10 LAB — CBC WITH DIFFERENTIAL/PLATELET
BASOS ABS: 0 10*3/uL (ref 0.0–0.1)
Basophils Relative: 0.5 % (ref 0.0–3.0)
EOS PCT: 2.2 % (ref 0.0–5.0)
Eosinophils Absolute: 0.1 10*3/uL (ref 0.0–0.7)
HEMATOCRIT: 38.5 % (ref 36.0–46.0)
HEMOGLOBIN: 13.2 g/dL (ref 12.0–15.0)
LYMPHS ABS: 1.6 10*3/uL (ref 0.7–4.0)
LYMPHS PCT: 24 % (ref 12.0–46.0)
MCHC: 34.4 g/dL (ref 30.0–36.0)
MCV: 92.8 fl (ref 78.0–100.0)
MONOS PCT: 9 % (ref 3.0–12.0)
Monocytes Absolute: 0.6 10*3/uL (ref 0.1–1.0)
NEUTROS PCT: 64.3 % (ref 43.0–77.0)
Neutro Abs: 4.2 10*3/uL (ref 1.4–7.7)
Platelets: 255 10*3/uL (ref 150.0–400.0)
RBC: 4.15 Mil/uL (ref 3.87–5.11)
RDW: 13.8 % (ref 11.5–15.5)
WBC: 6.5 10*3/uL (ref 4.0–10.5)

## 2016-01-10 LAB — COMPREHENSIVE METABOLIC PANEL
ALK PHOS: 69 U/L (ref 39–117)
ALT: 22 U/L (ref 0–35)
AST: 21 U/L (ref 0–37)
Albumin: 4.3 g/dL (ref 3.5–5.2)
BILIRUBIN TOTAL: 0.8 mg/dL (ref 0.2–1.2)
BUN: 21 mg/dL (ref 6–23)
CO2: 30 meq/L (ref 19–32)
Calcium: 9.2 mg/dL (ref 8.4–10.5)
Chloride: 101 mEq/L (ref 96–112)
Creatinine, Ser: 1.14 mg/dL (ref 0.40–1.20)
GFR: 49.46 mL/min — AB (ref >60.00)
GLUCOSE: 96 mg/dL (ref 70–99)
Potassium: 3.8 mEq/L (ref 3.5–5.1)
SODIUM: 138 meq/L (ref 135–145)
TOTAL PROTEIN: 8 g/dL (ref 6.0–8.3)

## 2016-01-10 LAB — URINALYSIS, ROUTINE W REFLEX MICROSCOPIC
BILIRUBIN URINE: NEGATIVE
HGB URINE DIPSTICK: NEGATIVE
Ketones, ur: NEGATIVE
LEUKOCYTES UA: NEGATIVE
NITRITE: NEGATIVE
RBC / HPF: NONE SEEN (ref 0–?)
Specific Gravity, Urine: 1.025 (ref 1.000–1.030)
TOTAL PROTEIN, URINE-UPE24: 30 — AB
Urine Glucose: NEGATIVE
Urobilinogen, UA: 1 (ref 0.0–1.0)
pH: 6 (ref 5.0–8.0)

## 2016-01-10 LAB — HEMOGLOBIN A1C: Hgb A1c MFr Bld: 5.9 % (ref 4.6–6.5)

## 2016-01-10 LAB — C-REACTIVE PROTEIN: CRP: 0.1 mg/dL — AB (ref 0.5–20.0)

## 2016-01-10 LAB — SEDIMENTATION RATE: SED RATE: 19 mm/h (ref 0–30)

## 2016-01-10 NOTE — Progress Notes (Signed)
Pre-visit discussion using our clinic review tool. No additional management support is needed unless otherwise documented below in the visit note.  

## 2016-01-10 NOTE — Patient Instructions (Signed)
Your back pain may be from muscle strain  If your kidney functio has returned to normal..  I will refill the meloxica  You cna use tylenol up to 2000 mg daily,  (4000 mg ok for a few days for worse pain )

## 2016-01-10 NOTE — Progress Notes (Signed)
Subjective:  Patient ID: Whitney Molina, female    DOB: 1941-04-14  Age: 75 y.o. MRN: [id number]  CC: The primary encounter diagnosis was Prediabetes. Diagnoses of Flank pain, Hyperlipidemia, Encounter for immunization, Essential hypertension, and Back pain at L4-L5 level were also pertinent to this visit.  HPI [name] presents for follow up on chronic issues including hyperlipidemia, hypertension and prediabetes     CC: BACK PAIN AFTER WEEDING ,  BILATERAL DULL ACHE,  WORSE AFTER RESTING  The pain .RADIATES TO BOTH BUTTOCKS  RIGHT > LEFT,  And  wraps around to suprapubic and inguinal regions  Has used meloxicam  in the past  But last Cr was elevatedin May .  Thought it might have been a flare of diverticulits since her pain and stool patternwas similar to October presentation,  But stool pattern has returned to normal. She has chonic diarrhea alternationg  with constipation.  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    NOT TAKING SYMBICORT.  STOPPED USING IT IN MAY .  HAS NOT HAD ANY DYSPNEA OR COUGH  FINISHED TREATMENT IN JULY FOR LYMPHOMA,  ENERGY COMING BACK 60%  .  Has resumed regular exercise  NEXT PET SCAN IN JANUARY 2018    Fasting lipids in April  At goal  Lab Results  Component Value Date   HGBA1C 5.9 01/10/2016   .  Lab Results  Component Value Date   CHOL 163 09/03/2015   HDL 33 (L) 01/10/2016   LDLCALC 96 09/03/2015   LDLDIRECT 103 01/10/2016   TRIG 145 01/10/2016   CHOLHDL 5.3 (H) 01/10/2016       Outpatient Medications Prior to Visit  Medication Sig Dispense Refill  . atorvastatin (LIPITOR) 20 MG tablet Take 20 mg by mouth daily.    Marland Kitchen escitalopram (LEXAPRO) 10 MG tablet TAKE ONE (1) TABLET BY MOUTH EVERY DAY 90 tablet 4  . hyoscyamine (LEVBID) 0.375 MG 12 hr tablet TAKE ONE (1) TABLET BY MOUTH EVERY DAY (Patient taking differently: TAKE ONE (0.5) TABLET BY MOUTH EVERY DAY) 90 tablet 2  . losartan (COZAAR) 100 MG tablet TAKE ONE (1)  TABLET BY MOUTH EVERY DAY 90 tablet 1  . metoprolol succinate (TOPROL-XL) 25 MG 24 hr tablet TAKE ONE (1) TABLET BY MOUTH EVERY DAY 90 tablet 1  . potassium chloride SA (K-DUR,KLOR-CON) 20 MEQ tablet TAKE ONE (1) TABLET BY MOUTH EVERY DAY 30 tablet 2  . simvastatin (ZOCOR) 40 MG tablet TAKE ONE TABLET EACH EVENING 90 tablet 1  . Spacer/Aero-Holding Chambers (AEROCHAMBER MV) inhaler Use as instructed 1 each 0  . triamterene-hydrochlorothiazide (MAXZIDE-25) 37.5-25 MG per tablet Take 0.5 tablets by mouth daily. 90 tablet 3  . budesonide-formoterol (SYMBICORT) 80-4.5 MCG/ACT inhaler Inhale 2 puffs into the lungs 2 (two) times daily.    Marland Kitchen zoster vaccine live, PF, (ZOSTAVAX) 29562 UNT/0.65ML injection Inject 19,400 Units into the skin once. (Patient not taking: Reported on 01/10/2016) 1 each 0   No facility-administered medications prior to visit.     Review of Systems;  Patient denies headache, fevers, malaise, unintentional weight loss, skin rash, eye pain, sinus congestion and sinus pain, sore throat, dysphagia,  hemoptysis , cough, dyspnea, wheezing, chest pain, palpitations, orthopnea, edema, abdominal pain, nausea, melena, diarrhea, constipation, flank pain, dysuria, hematuria, urinary  Frequency, nocturia, numbness, tingling, seizures,  Focal weakness, Loss of consciousness,  Tremor, insomnia, depression, anxiety, and suicidal ideation.      Objective:  BP 132/78   Pulse Marland Kitchen)  56   Temp 98 F (36.7 C) (Oral)   Resp 12   Ht 5' 2.75" (1.594 m)   Wt 149 lb 8 oz (67.8 kg)   SpO2 98%   BMI 26.69 kg/m   BP Readings from Last 3 Encounters:  01/10/16 132/78  09/29/15 (!) 141/93  09/03/15 130/76    Wt Readings from Last 3 Encounters:  01/10/16 149 lb 8 oz (67.8 kg)  09/29/15 151 lb 5.5 oz (68.6 kg)  09/03/15 150 lb (68 kg)    General appearance: alert, cooperative and appears stated age Ears: normal TM's and external ear canals both ears Throat: lips, mucosa, and tongue normal;  teeth and gums normal Neck: no adenopathy, no carotid bruit, supple, symmetrical, trachea midline and thyroid not enlarged, symmetric, no tenderness/mass/nodules Back: symmetric, no curvature. ROM normal. No CVA tenderness. Lungs: clear to auscultation bilaterally Heart: regular rate and rhythm, S1, S2 normal, no murmur, click, rub or gallop Abdomen: soft, non-tender; bowel sounds normal; no masses,  no organomegaly Pulses: 2+ and symmetric Skin: Skin color, texture, turgor normal. No rashes or lesions Lymph nodes: Cervical, supraclavicular, and axillary nodes normal.  Lab Results  Component Value Date   HGBA1C 5.9 01/10/2016   HGBA1C 5.9 09/03/2015    Lab Results  Component Value Date   CREATININE 1.14 01/10/2016   CREATININE 1.09 (H) 09/22/2015   CREATININE 0.95 09/03/2015    Lab Results  Component Value Date   WBC 6.5 01/10/2016   HGB 13.2 01/10/2016   HCT 38.5 01/10/2016   PLT 255.0 01/10/2016   GLUCOSE 96 01/10/2016   CHOL 163 09/03/2015   TRIG 145 01/10/2016   HDL 33 (L) 01/10/2016   LDLDIRECT 103 01/10/2016   LDLCALC 96 09/03/2015   ALT 22 01/10/2016   AST 21 01/10/2016   NA 138 01/10/2016   K 3.8 01/10/2016   CL 101 01/10/2016   CREATININE 1.14 01/10/2016   BUN 21 01/10/2016   CO2 30 01/10/2016   TSH 1.29 09/03/2015   INR 1.02 03/24/2015   HGBA1C 5.9 01/10/2016    Nm Pet Image Restag (ps) Skull Base To Thigh  Result Date: 09/22/2015 CLINICAL DATA:  Subsequent treatment strategy for follicular lymphoma of the abdominal retroperitoneal space. EXAM: NUCLEAR MEDICINE PET SKULL BASE TO THIGH TECHNIQUE: 12.3 mCi F-18 FDG was injected intravenously. Full-ring PET imaging was performed from the skull base to thigh after the radiotracer. CT data was obtained and used for attenuation correction and anatomic localization. FASTING BLOOD GLUCOSE:  Value: 94 mg/dl COMPARISON:  06/29/2015 FINDINGS: NECK No hypermetabolic lymph nodes in the neck. CHEST No hypermetabolic  mediastinal or hilar nodes. No suspicious pulmonary nodules on the CT scan. ABDOMEN/PELVIS No abnormal hypermetabolic activity within the liver, pancreas, adrenal glands, or spleen. Marked interval response of the previously described para-aortic lymph nodes. The 2.3 cm short axis left para-aortic lymph node measured previously at 2.3 cm now measures 0.8 cm (image 149 series 4). No evidence for hypermetabolism in this lymph node today with uptake at or below background retroperitoneal soft tissue levels. The adjacent left para-aortic lymph node measured previously at 8 mm has decreased in size to 4 mm in the interval (image 158 series 4). Similarly, this lesion shows no evidence for hypermetabolism on today's study. Large left renal cysts are again noted, stable. Abdominal aortic atherosclerosis is evident. Patient is status post cholecystectomy. Diverticular changes are noted in the left colon without diverticulitis. SKELETON No focal hypermetabolic activity to suggest skeletal metastasis. IMPRESSION: 1. Marked  interval response to therapy with resolution of the 2 hypermetabolic enlarged lymph nodes on the previous study. Both lymph nodes measure within normal limits today in show no hypermetabolism on the current exam. FDG uptake in these lymph nodes is at or below background retroperitoneal soft tissue levels. 2. Stable appearance of large left renal cysts. 3. Abdominal aortic atherosclerosis. 4. Left colonic diverticulosis without diverticulitis. Electronically Signed   By: Misty Stanley M.D.   On: 09/22/2015 11:18    Assessment & Plan:   Problem List Items Addressed This Visit    Essential hypertension    Well controlled on current regimen. Renal function stable, no changes today.  Lab Results  Component Value Date   CREATININE 1.14 01/10/2016   Lab Results  Component Value Date   NA 138 01/10/2016   K 3.8 01/10/2016   CL 101 01/10/2016   CO2 30 01/10/2016         Back pain at L4-L5 level     Appears to be muscle strain .  meloxicam used in the past  With good results      Relevant Medications   meloxicam (MOBIC) 7.5 MG tablet    Other Visit Diagnoses    Prediabetes    -  Primary   Relevant Orders   Comprehensive metabolic panel (Completed)   Hemoglobin A1c (Completed)   Flank pain       Relevant Orders   Sedimentation rate (Completed)   C-reactive protein (Completed)   CBC with Differential/Platelet (Completed)   Urinalysis, Routine w reflex microscopic (Completed)   Hyperlipidemia       Relevant Orders   Lipid Panel w/Direct LDL:HDL Ratio (Completed)   Encounter for immunization       Relevant Orders   Flu vaccine HIGH DOSE PF (Completed)    A total of 25 minutes of face to face time was spent with patient more than half of which was spent in counselling about the above mentioned conditions  and coordination of care   I am having Ms. Tuberville start on meloxicam. I am also having her maintain her AEROCHAMBER MV, zoster vaccine live (PF), triamterene-hydrochlorothiazide, escitalopram, atorvastatin, hyoscyamine, budesonide-formoterol, simvastatin, metoprolol succinate, potassium chloride SA, and losartan.  Meds ordered this encounter  Medications  . meloxicam (MOBIC) 7.5 MG tablet    Sig: Take 1 tablet (7.5 mg total) by mouth daily.    Dispense:  30 tablet    Refill:  5    There are no discontinued medications.  Follow-up: No Follow-up on file.   [name], MD

## 2016-01-11 DIAGNOSIS — M545 Low back pain, unspecified: Secondary | ICD-10-CM | POA: Insufficient documentation

## 2016-01-11 LAB — LIPID PANEL W/DIRECT LDL/HDL RATIO
Cholesterol: 176 mg/dL (ref 125–200)
Direct LDL: 103 mg/dL (ref <130)
HDL: 33 mg/dL — ABNORMAL LOW (ref >=46)
LDL/HDL RATIO (DIRECT LDL): 3.1 ratio
TRIGLYCERIDES: 145 mg/dL (ref <150)
Total Chol/HDL Ratio: 5.3 Ratio — ABNORMAL HIGH (ref <=5.0)

## 2016-01-11 MED ORDER — MELOXICAM 7.5 MG PO TABS: 7.5000 mg | ORAL_TABLET | Freq: Every day | ORAL | 5 refills | Status: DC

## 2016-01-11 NOTE — Assessment & Plan Note (Signed)
Well controlled on current regimen. Renal function stable, no changes today.  Lab Results  Component Value Date   CREATININE 1.14 01/10/2016   Lab Results  Component Value Date   NA 138 01/10/2016   K 3.8 01/10/2016   CL 101 01/10/2016   CO2 30 01/10/2016

## 2016-01-11 NOTE — Assessment & Plan Note (Signed)
In remission after finishing treatment in July.

## 2016-01-11 NOTE — Assessment & Plan Note (Addendum)
Appears to be muscle strain .  meloxicam used in the past  With good results

## 2016-01-12 ENCOUNTER — Telehealth: Payer: Self-pay | Admitting: Internal Medicine

## 2016-01-14 ENCOUNTER — Telehealth: Payer: Self-pay | Admitting: *Deleted

## 2016-01-14 NOTE — Telephone Encounter (Signed)
Patient has requested lab results  Pt contact [phone]

## 2016-01-14 NOTE — Telephone Encounter (Signed)
Patient was informed of results.  Patient understood and no questions, comments, or concerns at this time.  

## 2016-01-24 DIAGNOSIS — J301 Allergic rhinitis due to pollen: Secondary | ICD-10-CM | POA: Diagnosis not present

## 2016-01-31 ENCOUNTER — Telehealth: Payer: Self-pay | Admitting: Internal Medicine

## 2016-01-31 DIAGNOSIS — D225 Melanocytic nevi of trunk: Secondary | ICD-10-CM | POA: Diagnosis not present

## 2016-01-31 DIAGNOSIS — L570 Actinic keratosis: Secondary | ICD-10-CM | POA: Diagnosis not present

## 2016-01-31 DIAGNOSIS — L578 Other skin changes due to chronic exposure to nonionizing radiation: Secondary | ICD-10-CM | POA: Diagnosis not present

## 2016-01-31 DIAGNOSIS — L820 Inflamed seborrheic keratosis: Secondary | ICD-10-CM | POA: Diagnosis not present

## 2016-01-31 DIAGNOSIS — Z1283 Encounter for screening for malignant neoplasm of skin: Secondary | ICD-10-CM | POA: Diagnosis not present

## 2016-01-31 DIAGNOSIS — J301 Allergic rhinitis due to pollen: Secondary | ICD-10-CM | POA: Diagnosis not present

## 2016-01-31 DIAGNOSIS — D485 Neoplasm of uncertain behavior of skin: Secondary | ICD-10-CM | POA: Diagnosis not present

## 2016-01-31 DIAGNOSIS — D229 Melanocytic nevi, unspecified: Secondary | ICD-10-CM | POA: Diagnosis not present

## 2016-01-31 DIAGNOSIS — Z85828 Personal history of other malignant neoplasm of skin: Secondary | ICD-10-CM | POA: Diagnosis not present

## 2016-01-31 NOTE — Telephone Encounter (Signed)
Pt was transferred back to lab & I sopke with her and gave her the same lab results that [facility] gave her last month. I didn't see anything else to discuss.

## 2016-01-31 NOTE — Telephone Encounter (Signed)
Left a message to return my call, thanks

## 2016-01-31 NOTE — Telephone Encounter (Signed)
Pt called to get lab results from 01/10/16. Please advise?  Call pt @ [phone]. Thank you!

## 2016-02-02 ENCOUNTER — Ambulatory Visit: Payer: Medicare Other | Admitting: Internal Medicine

## 2016-02-02 ENCOUNTER — Other Ambulatory Visit: Payer: Medicare Other

## 2016-02-04 ENCOUNTER — Inpatient Hospital Stay: Payer: Medicare Other

## 2016-02-04 ENCOUNTER — Other Ambulatory Visit: Payer: Self-pay

## 2016-02-04 ENCOUNTER — Inpatient Hospital Stay: Payer: Medicare Other | Attending: Internal Medicine | Admitting: Internal Medicine

## 2016-02-04 DIAGNOSIS — I10 Essential (primary) hypertension: Secondary | ICD-10-CM | POA: Diagnosis not present

## 2016-02-04 DIAGNOSIS — Z8052 Family history of malignant neoplasm of bladder: Secondary | ICD-10-CM | POA: Diagnosis not present

## 2016-02-04 DIAGNOSIS — C8280 Other types of follicular lymphoma, unspecified site: Secondary | ICD-10-CM

## 2016-02-04 DIAGNOSIS — Z803 Family history of malignant neoplasm of breast: Secondary | ICD-10-CM | POA: Insufficient documentation

## 2016-02-04 DIAGNOSIS — Z86010 Personal history of colonic polyps: Secondary | ICD-10-CM | POA: Insufficient documentation

## 2016-02-04 DIAGNOSIS — K649 Unspecified hemorrhoids: Secondary | ICD-10-CM | POA: Insufficient documentation

## 2016-02-04 DIAGNOSIS — R011 Cardiac murmur, unspecified: Secondary | ICD-10-CM | POA: Diagnosis not present

## 2016-02-04 DIAGNOSIS — Z85828 Personal history of other malignant neoplasm of skin: Secondary | ICD-10-CM | POA: Insufficient documentation

## 2016-02-04 DIAGNOSIS — E785 Hyperlipidemia, unspecified: Secondary | ICD-10-CM | POA: Insufficient documentation

## 2016-02-04 DIAGNOSIS — Z79899 Other long term (current) drug therapy: Secondary | ICD-10-CM | POA: Insufficient documentation

## 2016-02-04 DIAGNOSIS — C8213 Follicular lymphoma grade II, intra-abdominal lymph nodes: Secondary | ICD-10-CM | POA: Diagnosis not present

## 2016-02-04 DIAGNOSIS — Z87442 Personal history of urinary calculi: Secondary | ICD-10-CM | POA: Diagnosis not present

## 2016-02-04 DIAGNOSIS — K219 Gastro-esophageal reflux disease without esophagitis: Secondary | ICD-10-CM | POA: Insufficient documentation

## 2016-02-04 LAB — CBC WITH DIFFERENTIAL/PLATELET
BASOS ABS: 0 10*3/uL (ref 0–0.1)
Basophils Relative: 1 %
EOS ABS: 0.2 10*3/uL (ref 0–0.7)
EOS PCT: 3 %
HCT: 38.1 % (ref 35.0–47.0)
Hemoglobin: 13.4 g/dL (ref 12.0–16.0)
Lymphocytes Relative: 25 %
Lymphs Abs: 1.4 10*3/uL (ref 1.0–3.6)
MCH: 32.5 pg (ref 26.0–34.0)
MCHC: 35.1 g/dL (ref 32.0–36.0)
MCV: 92.4 fL (ref 80.0–100.0)
MONO ABS: 0.5 10*3/uL (ref 0.2–0.9)
Monocytes Relative: 10 %
Neutro Abs: 3.4 10*3/uL (ref 1.4–6.5)
Neutrophils Relative %: 61 %
PLATELETS: 219 10*3/uL (ref 150–440)
RBC: 4.12 MIL/uL (ref 3.80–5.20)
RDW: 13.4 % (ref 11.5–14.5)
WBC: 5.6 10*3/uL (ref 3.6–11.0)

## 2016-02-04 LAB — COMPREHENSIVE METABOLIC PANEL
ALT: 20 U/L (ref 14–54)
AST: 22 U/L (ref 15–41)
Albumin: 4.1 g/dL (ref 3.5–5.0)
Alkaline Phosphatase: 64 U/L (ref 38–126)
Anion gap: 6 (ref 5–15)
BUN: 16 mg/dL (ref 6–20)
CHLORIDE: 101 mmol/L (ref 101–111)
CO2: 29 mmol/L (ref 22–32)
CREATININE: 1.03 mg/dL — AB (ref 0.44–1.00)
Calcium: 9 mg/dL (ref 8.9–10.3)
GFR calc non Af Amer: 52 mL/min — ABNORMAL LOW (ref >60)
Glucose, Bld: 92 mg/dL (ref 65–99)
POTASSIUM: 3.7 mmol/L (ref 3.5–5.1)
SODIUM: 136 mmol/L (ref 135–145)
Total Bilirubin: 1 mg/dL (ref 0.3–1.2)
Total Protein: 7.8 g/dL (ref 6.5–8.1)

## 2016-02-04 LAB — LACTATE DEHYDROGENASE: LDH: 168 U/L (ref 98–192)

## 2016-02-04 NOTE — Progress Notes (Signed)
[place] OFFICE PROGRESS NOTE  Patient Care Team: [name], MD as PCP - General (Internal Medicine) Robert Bellow, MD (General Surgery)   SUMMARY OF ONCOLOGIC HISTORY:  # NOV [id number]- FOLLICULAR LYMPHOMA G-2; [s/p Bx- RETROPERITONEAL/LEFT Peri-aortic LN ~ 2.5CM [incidental on CT scan in 2016; CT- Nov 2014- ~1CM]/ PET 2016-Left Peri-aortic LN; Suv ~15; Ext Iliac LN 6 mm; Feb 2017- Unchanged LN; START Rituxan q week x4 [finished march 15th]  # Hx of Shingles [Sep 2016]-   INTERVAL HISTORY: A very pleasant 75 year old female patient with history of follicular lymphoma grade 2 retroperitoneal is currently status post 4 infusions of rituximab single agent is here for follow-up.    Continues to deny any weight loss or any night sweats or fevers. No nausea no vomiting. Appetite is good. No diarrhea or constipation.   REVIEW OF SYSTEMS:  A complete 10 point review of system is done which is negative except mentioned above/history of present illness.   PAST MEDICAL HISTORY :  Past Medical History:  Diagnosis Date  . Basal cell carcinoma of nose   . Bronchitis   . GERD (gastroesophageal reflux disease)   . Heart murmur   . Hemorrhoids   . History of colon polyps   . History of kidney stones   . History of mammogram 2016  . Hyperlipidemia   . Hypertension   . 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 ABDOMINAL HYSTERECTOMY  1984  . BLADDER SUSPENSION  2010  . CHOLECYSTECTOMY  06-01-03  . COLONOSCOPY  2003  . LITHOTRIPSY  2005  . local exicsion skin cancer  2004   squamous cell of chest wall. left chest  . TONSILLECTOMY  1971  . TUBAL LIGATION  1975  . VAGINAL PROLAPSE REPAIR  July 2001   Pelvic Prolapse    FAMILY HISTORY :   Family History  Problem Relation Age of Onset  . Cancer Mother     breast   . Stroke Mother   . Stroke Father    . Cancer Brother     bladder  . Stroke Brother     SOCIAL HISTORY:   Social History  Substance Use Topics  . Smoking status: Never Smoker  . Smokeless tobacco: Never Used  . Alcohol use No    ALLERGIES:  has No Known Allergies.  MEDICATIONS:  Current Outpatient Prescriptions  Medication Sig Dispense Refill  . atorvastatin (LIPITOR) 20 MG tablet Take 20 mg by mouth daily.    Marland Kitchen escitalopram (LEXAPRO) 10 MG tablet TAKE ONE (1) TABLET BY MOUTH EVERY DAY 90 tablet 4  . hyoscyamine (LEVBID) 0.375 MG 12 hr tablet TAKE ONE (1) TABLET BY MOUTH EVERY DAY (Patient taking differently: TAKE ONE (0.5) TABLET BY MOUTH EVERY DAY) 90 tablet 2  . losartan (COZAAR) 100 MG tablet TAKE ONE (1) TABLET BY MOUTH EVERY DAY 90 tablet 1  . metoprolol succinate (TOPROL-XL) 25 MG 24 hr tablet TAKE ONE (1) TABLET BY MOUTH EVERY DAY 90 tablet 1  . potassium chloride SA (K-DUR,KLOR-CON) 20 MEQ tablet TAKE ONE (1) TABLET BY MOUTH EVERY DAY 30 tablet 2  . simvastatin (ZOCOR) 40 MG tablet TAKE ONE TABLET EACH EVENING 90 tablet 1  . Spacer/Aero-Holding Chambers (AEROCHAMBER MV) inhaler Use as instructed 1 each 0  . triamterene-hydrochlorothiazide (MAXZIDE-25) 37.5-25 MG per tablet Take 0.5 tablets  by mouth daily. 90 tablet 3  . zoster vaccine live, PF, (ZOSTAVAX) [id number] UNT/0.65ML injection Inject 19,400 Units into the skin once. 1 each 0   No current facility-administered medications for this visit.     PHYSICAL EXAMINATION: ECOG PERFORMANCE STATUS: 0 - Asymptomatic  BP (!) 144/89 (Patient Position: Sitting)   Pulse 62   Temp 97.8 F (36.6 C) (Tympanic)   Resp 20   Ht 5' 2.75" (1.594 m)   Wt 148 lb 14.4 oz (67.5 kg)   BMI 26.59 kg/m   Filed Weights   02/04/16 1007  Weight: 148 lb 14.4 oz (67.5 kg)   GENERAL: Well-nourished well-developed; Alert, no distress and comfortable. Accompanied her husband. EYES: no pallor or icterus OROPHARYNX: no thrush or ulceration; good dentition  NECK: supple, no  masses felt LYMPH: no palpable lymphadenopathy in the cervical, axillary or inguinal regions LUNGS: clear to auscultation and No wheeze or crackles HEART/CVS: regular rate & rhythm and no murmurs; No lower extremity edema ABDOMEN: abdomen soft, non-tender and normal bowel sounds Musculoskeletal:no cyanosis of digits and no clubbing  PSYCH: alert & oriented x 3 with fluent speech NEURO: no focal motor/sensory deficits SKIN: no rashes or significant lesions  LABORATORY DATA:  I have reviewed the data as listed    Component Value Date/Time   NA 136 02/04/2016 0950   NA 138 09/10/2013 0758   K 3.7 02/04/2016 0950   K 3.3 (L) 09/10/2013 0758   CL 101 02/04/2016 0950   CL 100 09/10/2013 0758   CO2 29 02/04/2016 0950   CO2 32 09/10/2013 0758   GLUCOSE 92 02/04/2016 0950   GLUCOSE 85 09/10/2013 0758   BUN 16 02/04/2016 0950   BUN 13 09/10/2013 0758   CREATININE 1.03 (H) 02/04/2016 0950   CREATININE 0.96 12/22/2013 0857   CALCIUM 9.0 02/04/2016 0950   CALCIUM 9.2 09/10/2013 0758   PROT 7.8 02/04/2016 0950   PROT 8.3 (H) 09/10/2013 0758   ALBUMIN 4.1 02/04/2016 0950   ALBUMIN 3.5 09/10/2013 0758   AST 22 02/04/2016 0950   AST 23 09/10/2013 0758   ALT 20 02/04/2016 0950   ALT 28 09/10/2013 0758   ALKPHOS 64 02/04/2016 0950   ALKPHOS 78 09/10/2013 0758   BILITOT 1.0 02/04/2016 0950   BILITOT 0.6 09/10/2013 0758   GFRNONAA 52 (L) 02/04/2016 0950   GFRNONAA 59 (L) 12/22/2013 0857   GFRAA >60 02/04/2016 0950   GFRAA 68 12/22/2013 0857    No results found for: SPEP, UPEP  Lab Results  Component Value Date   WBC 5.6 02/04/2016   NEUTROABS 3.4 02/04/2016   HGB 13.4 02/04/2016   HCT 38.1 02/04/2016   MCV 92.4 02/04/2016   PLT 219 02/04/2016      Chemistry      Component Value Date/Time   NA 136 02/04/2016 0950   NA 138 09/10/2013 0758   K 3.7 02/04/2016 0950   K 3.3 (L) 09/10/2013 0758   CL 101 02/04/2016 0950   CL 100 09/10/2013 0758   CO2 29 02/04/2016 0950    CO2 32 09/10/2013 0758   BUN 16 02/04/2016 0950   BUN 13 09/10/2013 0758   CREATININE 1.03 (H) 02/04/2016 0950   CREATININE 0.96 12/22/2013 0857      Component Value Date/Time   CALCIUM 9.0 02/04/2016 0950   CALCIUM 9.2 09/10/2013 0758   ALKPHOS 64 02/04/2016 0950   ALKPHOS 78 09/10/2013 0758   AST 22 02/04/2016 0950   AST 23 09/10/2013 0758  ALT 20 02/04/2016 0950   ALT 28 09/10/2013 0758   BILITOT 1.0 02/04/2016 0950   BILITOT 0.6 09/10/2013 0758        ASSESSMENT & PLAN:  Follicular lymphoma grade ii, intra-abdominal lymph nodes (HCC) # RETROPERITONEAL LN [incidental/asymptomatic]- follicular lymphoma grade 2; likely stage II; Patient tolerated Rituxan weekly 4 Very well.  # MAY 2017-  Restaging PET scan shows significant improvement of the lymphadenopathy in the retroperitoneal lymphadenopathy/ pelvic adenopathy.    # Patient follow-up with me approximately 4 months with labs; PET scan.        Cammie Sickle, MD 02/04/2016 5:56 PM

## 2016-02-04 NOTE — Assessment & Plan Note (Addendum)
#   RETROPERITONEAL LN [incidental/asymptomatic]- follicular lymphoma grade 2; likely stage II; Patient tolerated Rituxan weekly 4 Very well.  # MAY 2017-  Restaging PET scan shows significant improvement of the lymphadenopathy in the retroperitoneal lymphadenopathy/ pelvic adenopathy. Clinically no evidence of progression.   # Patient follow-up with me approximately 4 months with labs; PET scan.

## 2016-02-07 ENCOUNTER — Other Ambulatory Visit: Payer: Self-pay

## 2016-02-07 DIAGNOSIS — J301 Allergic rhinitis due to pollen: Secondary | ICD-10-CM | POA: Diagnosis not present

## 2016-02-07 DIAGNOSIS — C8280 Other types of follicular lymphoma, unspecified site: Secondary | ICD-10-CM

## 2016-02-14 ENCOUNTER — Other Ambulatory Visit: Payer: Self-pay | Admitting: Internal Medicine

## 2016-02-14 DIAGNOSIS — J301 Allergic rhinitis due to pollen: Secondary | ICD-10-CM | POA: Diagnosis not present

## 2016-02-18 DIAGNOSIS — J301 Allergic rhinitis due to pollen: Secondary | ICD-10-CM | POA: Diagnosis not present

## 2016-02-21 DIAGNOSIS — J301 Allergic rhinitis due to pollen: Secondary | ICD-10-CM | POA: Diagnosis not present

## 2016-02-22 ENCOUNTER — Other Ambulatory Visit: Payer: Self-pay | Admitting: Internal Medicine

## 2016-03-06 DIAGNOSIS — J301 Allergic rhinitis due to pollen: Secondary | ICD-10-CM | POA: Diagnosis not present

## 2016-03-10 DIAGNOSIS — H04222 Epiphora due to insufficient drainage, left lacrimal gland: Secondary | ICD-10-CM | POA: Diagnosis not present

## 2016-03-13 DIAGNOSIS — J301 Allergic rhinitis due to pollen: Secondary | ICD-10-CM | POA: Diagnosis not present

## 2016-03-20 DIAGNOSIS — J301 Allergic rhinitis due to pollen: Secondary | ICD-10-CM | POA: Diagnosis not present

## 2016-03-27 DIAGNOSIS — J301 Allergic rhinitis due to pollen: Secondary | ICD-10-CM | POA: Diagnosis not present

## 2016-04-04 DIAGNOSIS — H04222 Epiphora due to insufficient drainage, left lacrimal gland: Secondary | ICD-10-CM | POA: Diagnosis not present

## 2016-04-10 DIAGNOSIS — J301 Allergic rhinitis due to pollen: Secondary | ICD-10-CM | POA: Diagnosis not present

## 2016-04-19 DIAGNOSIS — J301 Allergic rhinitis due to pollen: Secondary | ICD-10-CM | POA: Diagnosis not present

## 2016-04-24 ENCOUNTER — Other Ambulatory Visit: Payer: Self-pay | Admitting: Internal Medicine

## 2016-04-24 DIAGNOSIS — J301 Allergic rhinitis due to pollen: Secondary | ICD-10-CM | POA: Diagnosis not present

## 2016-04-27 ENCOUNTER — Ambulatory Visit: Payer: Medicare Other | Admitting: Family Medicine

## 2016-04-28 ENCOUNTER — Encounter: Payer: Self-pay | Admitting: Family Medicine

## 2016-04-28 ENCOUNTER — Ambulatory Visit (INDEPENDENT_AMBULATORY_CARE_PROVIDER_SITE_OTHER): Payer: Medicare Other | Admitting: Family Medicine

## 2016-04-28 DIAGNOSIS — J0100 Acute maxillary sinusitis, unspecified: Secondary | ICD-10-CM

## 2016-04-28 MED ORDER — BENZONATATE 200 MG PO CAPS: 200.0000 mg | ORAL_CAPSULE | Freq: Two times a day (BID) | ORAL | 0 refills | Status: DC | PRN

## 2016-04-28 MED ORDER — AMOXICILLIN-POT CLAVULANATE 875-125 MG PO TABS: 1.0000 | ORAL_TABLET | Freq: Two times a day (BID) | ORAL | 0 refills | Status: DC

## 2016-04-28 NOTE — Patient Instructions (Signed)
Nice to see you. You likely have a sinus infection. We will treat with Augmentin. You can take the Tessalon for cough. If you develop fevers, cough productive of blood, or shortness of breath please seek medical attention medially.

## 2016-04-28 NOTE — Progress Notes (Signed)
  Tommi Rumps, MD Phone: [phone]  Whitney Molina is a 75 y.o. female who presents today for same-day visit.  Patient notes at least a week of symptoms. Started with some drainage and sore throat. Then developed cough. Notes some sinus congestion. Cough productive of green mucus. No shortness of breath or wheezing. Has had some sweats and no fevers. Has been using cough drops and Mucinex. He was treated with amoxicillin about 2 weeks ago for dental issue and then her symptoms subsequently developed after this. Has a history of bronchiectasis.   ROS see history of present illness  Objective  Physical Exam Vitals:   04/28/16 0921  BP: (!) 150/90  Pulse: 69    BP Readings from Last 3 Encounters:  04/28/16 (!) 150/90  02/04/16 (!) 144/89  01/10/16 132/78   Wt Readings from Last 3 Encounters:  04/28/16 149 lb 14.4 oz (68 kg)  02/04/16 148 lb 14.4 oz (67.5 kg)  01/10/16 149 lb 8 oz (67.8 kg)    Physical Exam  Constitutional: No distress.  HENT:  Head: Normocephalic and atraumatic.  Mouth/Throat: Oropharynx is clear and moist. No oropharyngeal exudate.  Normal TMs bilaterally  Eyes: Conjunctivae are normal. Pupils are equal, round, and reactive to light.  Cardiovascular: Normal rate, regular rhythm and normal heart sounds.   Pulmonary/Chest: Effort normal and breath sounds normal.  Skin: She is not diaphoretic.     Assessment/Plan: Please see individual problem list.  Sinusitis, acute maxillary Symptoms most consistent with sinusitis. Given duration we will treat with Augmentin. Tessalon for cough. Given return precautions.   No orders of the defined types were placed in this encounter.   Meds ordered this encounter  Medications  . amoxicillin-clavulanate (AUGMENTIN) 875-125 MG tablet    Sig: Take 1 tablet by mouth 2 (two) times daily.    Dispense:  14 tablet    Refill:  0  . benzonatate (TESSALON) 200 MG capsule    Sig: Take 1 capsule (200 mg total)  by mouth 2 (two) times daily as needed for cough.    Dispense:  20 capsule    Refill:  0    Tommi Rumps, MD [place]

## 2016-04-28 NOTE — Assessment & Plan Note (Signed)
Symptoms most consistent with sinusitis. Given duration we will treat with Augmentin. Tessalon for cough. Given return precautions.

## 2016-05-01 DIAGNOSIS — J301 Allergic rhinitis due to pollen: Secondary | ICD-10-CM | POA: Diagnosis not present

## 2016-05-10 DIAGNOSIS — J301 Allergic rhinitis due to pollen: Secondary | ICD-10-CM | POA: Diagnosis not present

## 2016-05-12 DIAGNOSIS — J301 Allergic rhinitis due to pollen: Secondary | ICD-10-CM | POA: Diagnosis not present

## 2016-05-19 ENCOUNTER — Telehealth: Payer: Self-pay | Admitting: Internal Medicine

## 2016-05-19 ENCOUNTER — Ambulatory Visit: Payer: Medicare Other

## 2016-05-19 NOTE — Telephone Encounter (Signed)
Patient notified and will call to the walk in clinic

## 2016-05-19 NOTE — Telephone Encounter (Signed)
Pt called about about still needing a refill for the amoxicillin-clavulanate (AUGMENTIN) 875-125 MG tablet pt states it still lingering there. Please advise? Pt seen on 04/28/16 by Dr Caryl Bis and given medication listed above. Complaints of symptoms still present and request addl antibiotic be sent to pharmacy.   Pharmacy is [facility], [place]. Thank you!

## 2016-05-19 NOTE — Telephone Encounter (Signed)
Given that it has been 3 weeks since she was evaluated she should be re-evaluated to determine if this is the correct antibiotic for her to be on and to determine if an antibiotic is needed.

## 2016-05-19 NOTE — Telephone Encounter (Signed)
Does she need to be evaluated? Dr.Tullo patient

## 2016-05-22 DIAGNOSIS — J301 Allergic rhinitis due to pollen: Secondary | ICD-10-CM | POA: Diagnosis not present

## 2016-05-29 DIAGNOSIS — J301 Allergic rhinitis due to pollen: Secondary | ICD-10-CM | POA: Diagnosis not present

## 2016-06-05 ENCOUNTER — Inpatient Hospital Stay: Payer: Medicare Other | Admitting: Internal Medicine

## 2016-06-05 ENCOUNTER — Inpatient Hospital Stay: Payer: Medicare Other

## 2016-06-05 DIAGNOSIS — J301 Allergic rhinitis due to pollen: Secondary | ICD-10-CM | POA: Diagnosis not present

## 2016-06-12 DIAGNOSIS — J301 Allergic rhinitis due to pollen: Secondary | ICD-10-CM | POA: Diagnosis not present

## 2016-06-13 ENCOUNTER — Ambulatory Visit
Admission: RE | Admit: 2016-06-13 | Discharge: 2016-06-13 | Disposition: A | Discharge status: Home / Self Care | Payer: Medicare Other | Source: Ambulatory Visit | Attending: Internal Medicine | Admitting: Internal Medicine

## 2016-06-13 DIAGNOSIS — C8213 Follicular lymphoma grade II, intra-abdominal lymph nodes: Secondary | ICD-10-CM | POA: Insufficient documentation

## 2016-06-13 DIAGNOSIS — C8290 Follicular lymphoma, unspecified, unspecified site: Secondary | ICD-10-CM | POA: Diagnosis not present

## 2016-06-13 DIAGNOSIS — Z79899 Other long term (current) drug therapy: Secondary | ICD-10-CM | POA: Insufficient documentation

## 2016-06-13 LAB — GLUCOSE, CAPILLARY: Glucose-Capillary: 87 mg/dL (ref 65–99)

## 2016-06-13 MED ORDER — FLUDEOXYGLUCOSE F - 18 (FDG) INJECTION
12.0000 | Freq: Once | INTRAVENOUS | Status: AC | PRN
  Administered 2016-06-13: 12.36 via INTRAVENOUS

## 2016-06-19 DIAGNOSIS — J301 Allergic rhinitis due to pollen: Secondary | ICD-10-CM | POA: Diagnosis not present

## 2016-06-21 ENCOUNTER — Inpatient Hospital Stay: Payer: Medicare Other

## 2016-06-21 ENCOUNTER — Inpatient Hospital Stay: Payer: Medicare Other | Attending: Internal Medicine | Admitting: Internal Medicine

## 2016-06-21 VITALS — BP 145/73 | HR 67 | Wt 148.1 lb

## 2016-06-21 DIAGNOSIS — Z8052 Family history of malignant neoplasm of bladder: Secondary | ICD-10-CM | POA: Diagnosis not present

## 2016-06-21 DIAGNOSIS — Z803 Family history of malignant neoplasm of breast: Secondary | ICD-10-CM | POA: Insufficient documentation

## 2016-06-21 DIAGNOSIS — I10 Essential (primary) hypertension: Secondary | ICD-10-CM

## 2016-06-21 DIAGNOSIS — Z86010 Personal history of colonic polyps: Secondary | ICD-10-CM | POA: Insufficient documentation

## 2016-06-21 DIAGNOSIS — Z85828 Personal history of other malignant neoplasm of skin: Secondary | ICD-10-CM | POA: Diagnosis not present

## 2016-06-21 DIAGNOSIS — C8213 Follicular lymphoma grade II, intra-abdominal lymph nodes: Secondary | ICD-10-CM | POA: Diagnosis not present

## 2016-06-21 DIAGNOSIS — N281 Cyst of kidney, acquired: Secondary | ICD-10-CM | POA: Diagnosis not present

## 2016-06-21 DIAGNOSIS — R011 Cardiac murmur, unspecified: Secondary | ICD-10-CM | POA: Insufficient documentation

## 2016-06-21 DIAGNOSIS — C8280 Other types of follicular lymphoma, unspecified site: Secondary | ICD-10-CM

## 2016-06-21 DIAGNOSIS — E785 Hyperlipidemia, unspecified: Secondary | ICD-10-CM | POA: Insufficient documentation

## 2016-06-21 DIAGNOSIS — Z8619 Personal history of other infectious and parasitic diseases: Secondary | ICD-10-CM | POA: Diagnosis not present

## 2016-06-21 DIAGNOSIS — K219 Gastro-esophageal reflux disease without esophagitis: Secondary | ICD-10-CM | POA: Diagnosis not present

## 2016-06-21 DIAGNOSIS — Z79899 Other long term (current) drug therapy: Secondary | ICD-10-CM | POA: Insufficient documentation

## 2016-06-21 DIAGNOSIS — Z87442 Personal history of urinary calculi: Secondary | ICD-10-CM | POA: Insufficient documentation

## 2016-06-21 LAB — COMPREHENSIVE METABOLIC PANEL
ALBUMIN: 4.3 g/dL (ref 3.5–5.0)
ALK PHOS: 71 U/L (ref 38–126)
ALT: 25 U/L (ref 14–54)
AST: 27 U/L (ref 15–41)
Anion gap: 4 — ABNORMAL LOW (ref 5–15)
BILIRUBIN TOTAL: 0.7 mg/dL (ref 0.3–1.2)
BUN: 12 mg/dL (ref 6–20)
CALCIUM: 8.9 mg/dL (ref 8.9–10.3)
CO2: 30 mmol/L (ref 22–32)
CREATININE: 0.81 mg/dL (ref 0.44–1.00)
Chloride: 99 mmol/L — ABNORMAL LOW (ref 101–111)
GFR calc Af Amer: >60 mL/min (ref >60)
Glucose, Bld: 87 mg/dL (ref 65–99)
Potassium: 3.7 mmol/L (ref 3.5–5.1)
Sodium: 133 mmol/L — ABNORMAL LOW (ref 135–145)
TOTAL PROTEIN: 8.1 g/dL (ref 6.5–8.1)

## 2016-06-21 LAB — CBC WITH DIFFERENTIAL/PLATELET
BASOS PCT: 1 %
Basophils Absolute: 0 10*3/uL (ref 0–0.1)
Eosinophils Absolute: 0.2 10*3/uL (ref 0–0.7)
Eosinophils Relative: 2 %
HEMATOCRIT: 37.2 % (ref 35.0–47.0)
HEMOGLOBIN: 13.1 g/dL (ref 12.0–16.0)
LYMPHS PCT: 28 %
Lymphs Abs: 2.3 10*3/uL (ref 1.0–3.6)
MCH: 32.4 pg (ref 26.0–34.0)
MCHC: 35.2 g/dL (ref 32.0–36.0)
MCV: 91.9 fL (ref 80.0–100.0)
MONOS PCT: 7 %
Monocytes Absolute: 0.6 10*3/uL (ref 0.2–0.9)
NEUTROS ABS: 5 10*3/uL (ref 1.4–6.5)
NEUTROS PCT: 62 %
Platelets: 235 10*3/uL (ref 150–440)
RBC: 4.05 MIL/uL (ref 3.80–5.20)
RDW: 13.1 % (ref 11.5–14.5)
WBC: 8.1 10*3/uL (ref 3.6–11.0)

## 2016-06-21 LAB — LACTATE DEHYDROGENASE: LDH: 182 U/L (ref 98–192)

## 2016-06-21 NOTE — Assessment & Plan Note (Addendum)
#   RETROPERITONEAL LN [incidental/asymptomatic]- follicular lymphoma grade 2; likely stage II; Patient tolerated Rituxan weekly 4 Very well. JAN 30th PET 2018- NED.   # Hx of shingles- no recurrence at this time. Discussed to avoid live shingles vaccine.   # Large stable benign renal cyst normal kidney function. No recommendations at this time [previously followed by Dr.wolfe]  # Follow up in 6 months/labs/ no scans.   # I reviewed the blood work- with the patient in detail; also reviewed the imaging independently [as summarized above]; and with the patient in detail.   # 25 minutes face-to-face with the patient discussing the above plan of care; more than 50% of time spent on prognosis/ natural history; counseling and coordination.  Cc: Dr.Tullo 

## 2016-06-21 NOTE — Progress Notes (Signed)
[place] OFFICE PROGRESS NOTE  Patient Care Team: [name], MD as PCP - General (Internal Medicine) Robert Bellow, MD (General Surgery)   SUMMARY OF ONCOLOGIC HISTORY: Oncology History    # NOV [id number]- FOLLICULAR LYMPHOMA G-2; [s/p Bx- RETROPERITONEAL/LEFT Peri-aortic LN ~ 2.5CM [incidental on CT scan in 2016; CT- Nov 2014- ~1CM]/ PET 2016-Left Peri-aortic LN; Suv ~15; Ext Iliac LN 6 mm; Feb 2017- Unchanged LN; START Rituxan q week x4 [finished march 15th]; PET JAN 30th 2018- NED.   # Hx of Shingles [Sep [id number]-      Follicular lymphoma grade ii, intra-abdominal lymph nodes (HCC)      INTERVAL HISTORY: A very pleasant 76 year old female patient with history of follicular lymphoma grade 2 retroperitoneal is currently status post 4 infusions of rituximab single agent is here for follow-up/ review the results of PET scan.   Continues to deny any weight loss or any night sweats or fevers. No nausea no vomiting. Appetite is good. No abdominal pain.    REVIEW OF SYSTEMS:  A complete 10 point review of system is done which is negative except mentioned above/history of present illness.   PAST MEDICAL HISTORY :  Past Medical History:  Diagnosis Date  . Basal cell carcinoma of nose   . Bronchitis   . GERD (gastroesophageal reflux disease)   . Heart murmur   . Hemorrhoids   . History of colon polyps   . History of kidney stones   . History of mammogram 2016  . Hyperlipidemia   . Hypertension   . 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 ABDOMINAL HYSTERECTOMY  1984  . BLADDER SUSPENSION  2010  . CHOLECYSTECTOMY  06-01-03  . COLONOSCOPY  2003  . LITHOTRIPSY  2005  . local exicsion skin cancer  2004   squamous cell of chest wall. left chest  . TONSILLECTOMY  1971  . TUBAL LIGATION  1975  . VAGINAL PROLAPSE REPAIR  July 2001   Pelvic  Prolapse    FAMILY HISTORY :   Family History  Problem Relation Age of Onset  . Cancer Mother     breast   . Stroke Mother   . Stroke Father   . Cancer Brother     bladder  . Stroke Brother     SOCIAL HISTORY:   Social History  Substance Use Topics  . Smoking status: Never Smoker  . Smokeless tobacco: Never Used  . Alcohol use No    ALLERGIES:  has No Known Allergies.  MEDICATIONS:  Current Outpatient Prescriptions  Medication Sig Dispense Refill  . atorvastatin (LIPITOR) 20 MG tablet Take 20 mg by mouth daily.    Marland Kitchen escitalopram (LEXAPRO) 10 MG tablet TAKE ONE (1) TABLET EACH DAY 90 tablet 2  . hyoscyamine (LEVBID) 0.375 MG 12 hr tablet TAKE ONE (1) TABLET BY MOUTH EVERY DAY (Patient taking differently: TAKE ONE (0.5) TABLET BY MOUTH EVERY DAY) 90 tablet 2  . losartan (COZAAR) 100 MG tablet TAKE ONE (1) TABLET BY MOUTH EVERY DAY 90 tablet 1  . metoprolol succinate (TOPROL-XL) 25 MG 24 hr tablet TAKE ONE (1) TABLET BY MOUTH EVERY DAY 90 tablet 1  . potassium chloride SA (K-DUR,KLOR-CON) 20 MEQ tablet TAKE ONE TABLET BY MOUTH EVERY DAY 30 tablet 2  . simvastatin (ZOCOR) 40 MG tablet TAKE ONE TABLET EACH  EVENING 90 tablet 1  . triamterene-hydrochlorothiazide (MAXZIDE-25) 37.5-25 MG per tablet Take 0.5 tablets by mouth daily. 90 tablet 3   No current facility-administered medications for this visit.     PHYSICAL EXAMINATION: ECOG PERFORMANCE STATUS: 0 - Asymptomatic  BP (!) 145/73 (BP Location: Right Arm, Patient Position: Sitting)   Pulse 67   Wt 148 lb 2 oz (67.2 kg)   BMI 26.45 kg/m   Filed Weights   06/21/16 1504  Weight: 148 lb 2 oz (67.2 kg)   GENERAL: Well-nourished well-developed; Alert, no distress and comfortable. Accompanied her husband. EYES: no pallor or icterus OROPHARYNX: no thrush or ulceration; good dentition  NECK: supple, no masses felt LYMPH: no palpable lymphadenopathy in the cervical, axillary or inguinal regions LUNGS: clear to  auscultation and No wheeze or crackles HEART/CVS: regular rate & rhythm and no murmurs; No lower extremity edema ABDOMEN: abdomen soft, non-tender and normal bowel sounds Musculoskeletal:no cyanosis of digits and no clubbing  PSYCH: alert & oriented x 3 with fluent speech NEURO: no focal motor/sensory deficits SKIN: no rashes or significant lesions  LABORATORY DATA:  I have reviewed the data as listed    Component Value Date/Time   NA 133 (L) 06/21/2016 1421   NA 138 09/10/2013 0758   K 3.7 06/21/2016 1421   K 3.3 (L) 09/10/2013 0758   CL 99 (L) 06/21/2016 1421   CL 100 09/10/2013 0758   CO2 30 06/21/2016 1421   CO2 32 09/10/2013 0758   GLUCOSE 87 06/21/2016 1421   GLUCOSE 85 09/10/2013 0758   BUN 12 06/21/2016 1421   BUN 13 09/10/2013 0758   CREATININE 0.81 06/21/2016 1421   CREATININE 0.96 12/22/2013 0857   CALCIUM 8.9 06/21/2016 1421   CALCIUM 9.2 09/10/2013 0758   PROT 8.1 06/21/2016 1421   PROT 8.3 (H) 09/10/2013 0758   ALBUMIN 4.3 06/21/2016 1421   ALBUMIN 3.5 09/10/2013 0758   AST 27 06/21/2016 1421   AST 23 09/10/2013 0758   ALT 25 06/21/2016 1421   ALT 28 09/10/2013 0758   ALKPHOS 71 06/21/2016 1421   ALKPHOS 78 09/10/2013 0758   BILITOT 0.7 06/21/2016 1421   BILITOT 0.6 09/10/2013 0758   GFRNONAA >60 06/21/2016 1421   GFRNONAA 59 (L) 12/22/2013 0857   GFRAA >60 06/21/2016 1421   GFRAA 68 12/22/2013 0857    No results found for: SPEP, UPEP  Lab Results  Component Value Date   WBC 8.1 06/21/2016   NEUTROABS 5.0 06/21/2016   HGB 13.1 06/21/2016   HCT 37.2 06/21/2016   MCV 91.9 06/21/2016   PLT 235 06/21/2016      Chemistry      Component Value Date/Time   NA 133 (L) 06/21/2016 1421   NA 138 09/10/2013 0758   K 3.7 06/21/2016 1421   K 3.3 (L) 09/10/2013 0758   CL 99 (L) 06/21/2016 1421   CL 100 09/10/2013 0758   CO2 30 06/21/2016 1421   CO2 32 09/10/2013 0758   BUN 12 06/21/2016 1421   BUN 13 09/10/2013 0758   CREATININE 0.81 06/21/2016  1421   CREATININE 0.96 12/22/2013 0857      Component Value Date/Time   CALCIUM 8.9 06/21/2016 1421   CALCIUM 9.2 09/10/2013 0758   ALKPHOS 71 06/21/2016 1421   ALKPHOS 78 09/10/2013 0758   AST 27 06/21/2016 1421   AST 23 09/10/2013 0758   ALT 25 06/21/2016 1421   ALT 28 09/10/2013 0758   BILITOT 0.7 06/21/2016 1421  BILITOT 0.6 09/10/2013 0758        ASSESSMENT & PLAN:  Follicular lymphoma grade ii, intra-abdominal lymph nodes (HCC) # RETROPERITONEAL LN [incidental/asymptomatic]- follicular lymphoma grade 2; likely stage II; Patient tolerated Rituxan weekly 4 Very well. JAN 30th PET 2018- NED.   # Hx of shingles- no recurrence at this time. Discussed to avoid live shingles vaccine.   # Large stable benign renal cyst normal kidney function. No recommendations at this time [previously followed by Dr.wolfe]  # Follow up in 6 months/labs/ no scans.   # I reviewed the blood work- with the patient in detail; also reviewed the imaging independently [as summarized above]; and with the patient in detail.   # 25 minutes face-to-face with the patient discussing the above plan of care; more than 50% of time spent on prognosis/ natural history; counseling and coordination.  Cc: Dr.Tullo       Cammie Sickle, MD 06/21/2016 4:27 PM

## 2016-06-21 NOTE — Progress Notes (Signed)
Patient here today for follow up.  Patient states no new concerns today  

## 2016-06-26 DIAGNOSIS — J301 Allergic rhinitis due to pollen: Secondary | ICD-10-CM | POA: Diagnosis not present

## 2016-06-30 DIAGNOSIS — J301 Allergic rhinitis due to pollen: Secondary | ICD-10-CM | POA: Diagnosis not present

## 2016-07-03 DIAGNOSIS — J301 Allergic rhinitis due to pollen: Secondary | ICD-10-CM | POA: Diagnosis not present

## 2016-07-10 DIAGNOSIS — J301 Allergic rhinitis due to pollen: Secondary | ICD-10-CM | POA: Diagnosis not present

## 2016-07-17 DIAGNOSIS — J301 Allergic rhinitis due to pollen: Secondary | ICD-10-CM | POA: Diagnosis not present

## 2016-07-22 ENCOUNTER — Other Ambulatory Visit: Payer: Self-pay | Admitting: Internal Medicine

## 2016-07-22 IMAGING — CT CT ABD-PELV W/ CM
2 of 5 series · 15 of 46 positions shown, 17 images · IV contrast (omnipaque)
Comparison: CT abdomen pelvis 02/14/2015

CLINICAL DATA: Patient with left flank pain radiating to the left
lower quadrant for 2 weeks. Diarrhea.

EXAM:
CT ABDOMEN AND PELVIS WITH CONTRAST
TECHNIQUE: Multidetector CT imaging of the abdomen and pelvis was performed
using the standard protocol following bolus administration of
intravenous contrast.
CONTRAST:  80mL OMNIPAQUE IOHEXOL 300 MG/ML  SOLN

[Series 2: routine abd pel with · axial · 0.73mm/px · z∈[-814,-444]mm · 12 of 84 slices shown, 14 images]
[im 5/84  soft-tissue]
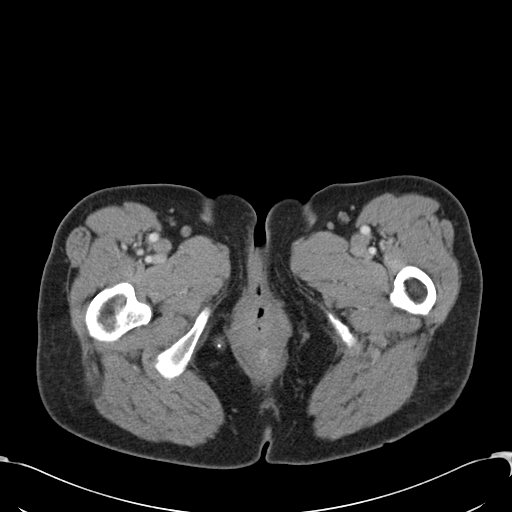
[im 5/84  bone]
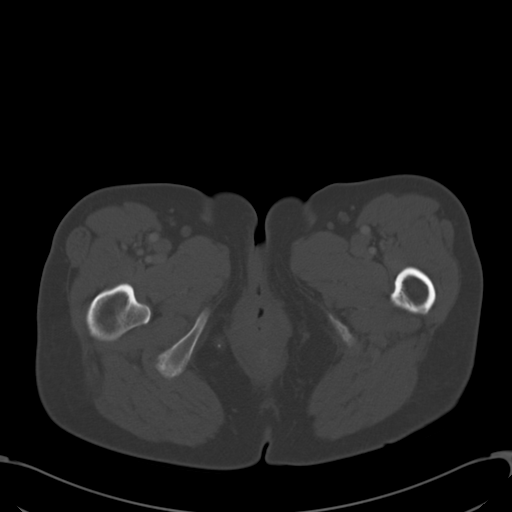
[im 15/84  soft-tissue]
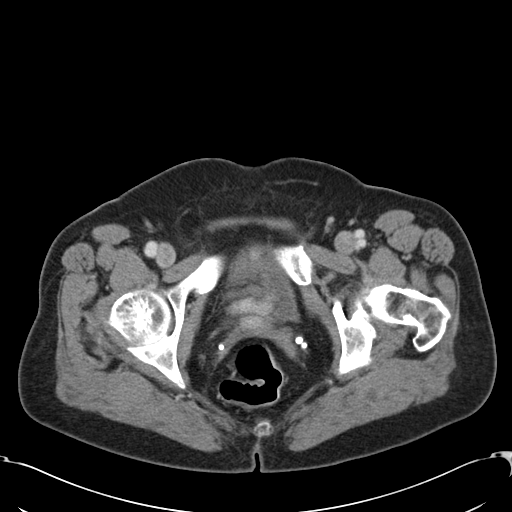
[im 20/84  soft-tissue]
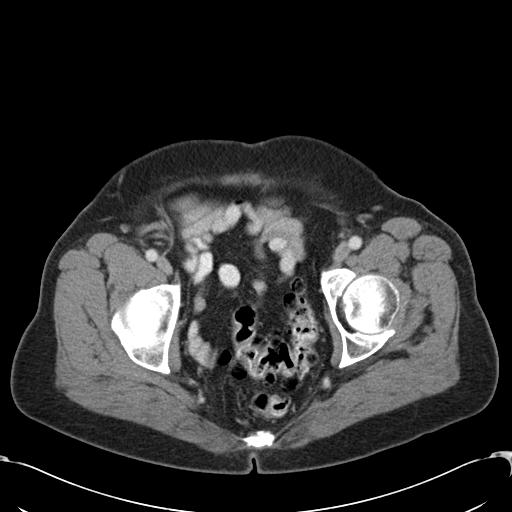
[im 25/84  soft-tissue]
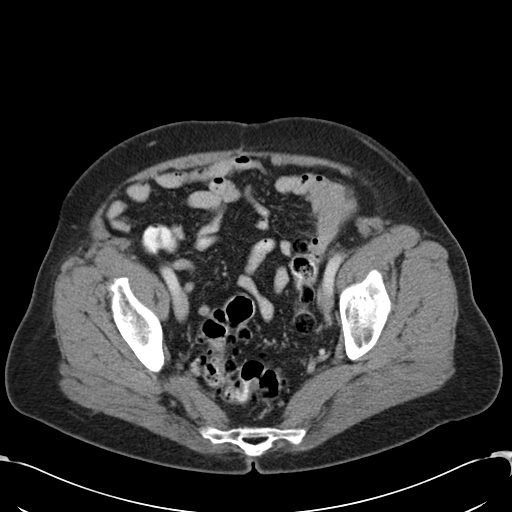
[im 35/84  soft-tissue]
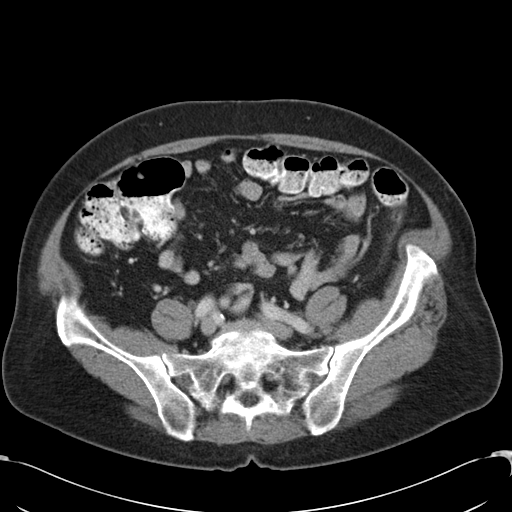
[im 40/84  soft-tissue]
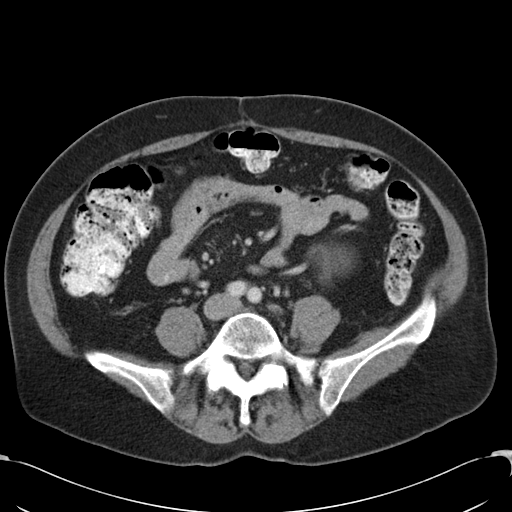
[im 44/84  soft-tissue]
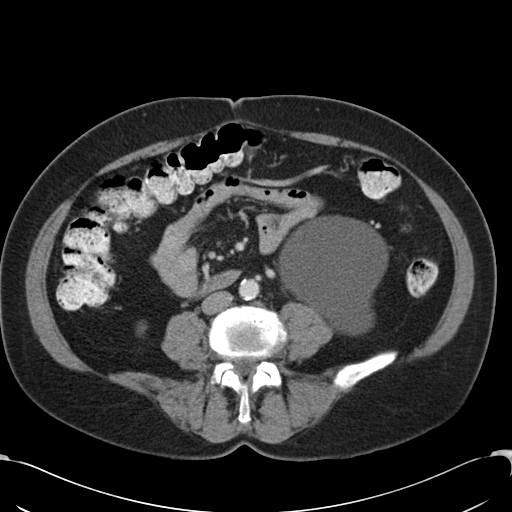
[im 54/84  soft-tissue]
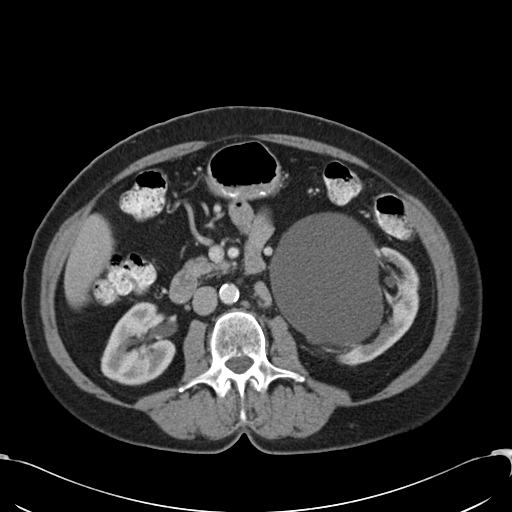
[im 59/84  soft-tissue]
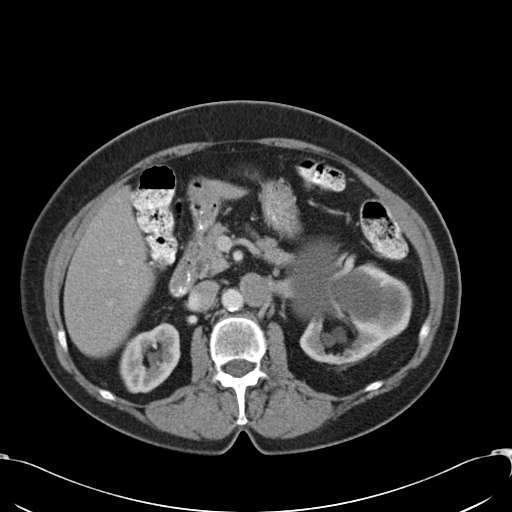
[im 59/84  bone]
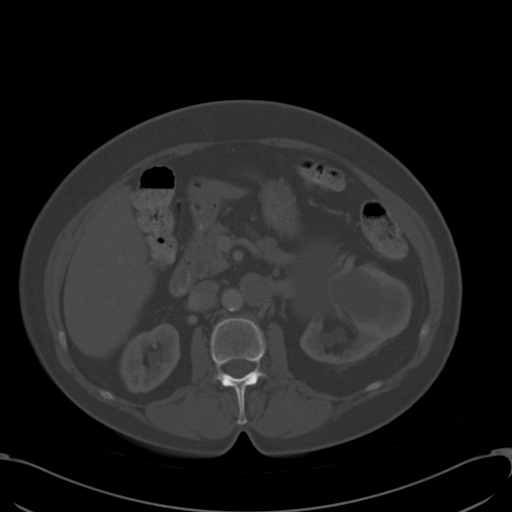
[im 64/84  soft-tissue]
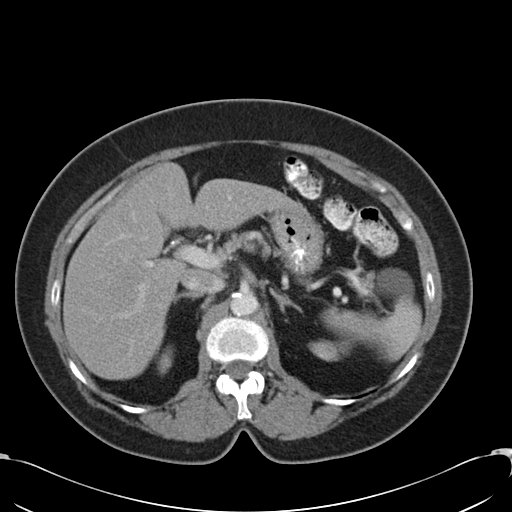
[im 74/84  soft-tissue]
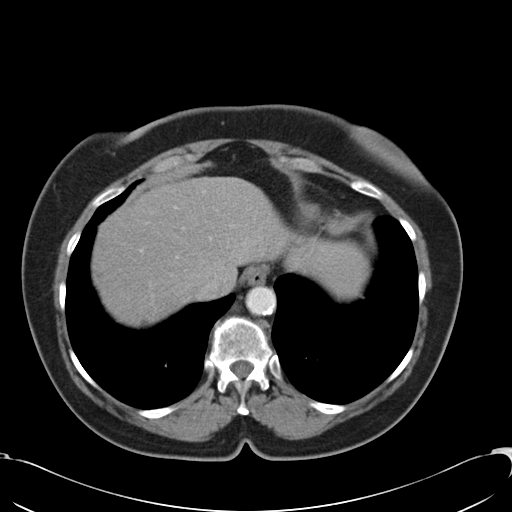
[im 79/84  soft-tissue]
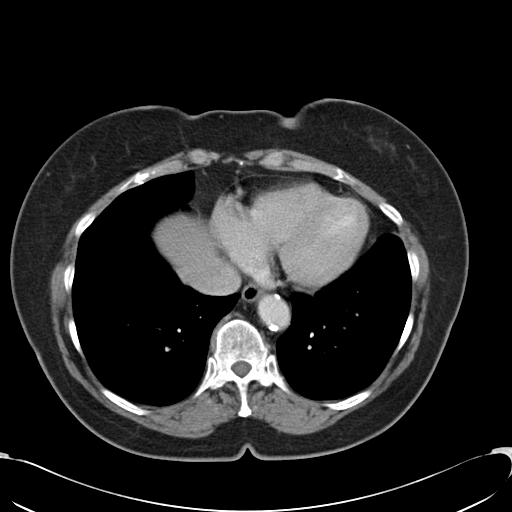

[Series 5: cor routine abd pel with · coronal · 0.69mm/px · 3 of 134 slices shown]
[im 45/134  soft-tissue]
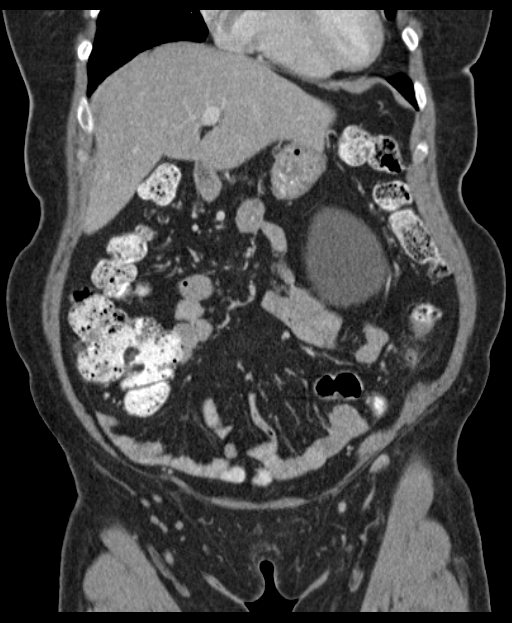
[im 60/134  soft-tissue]
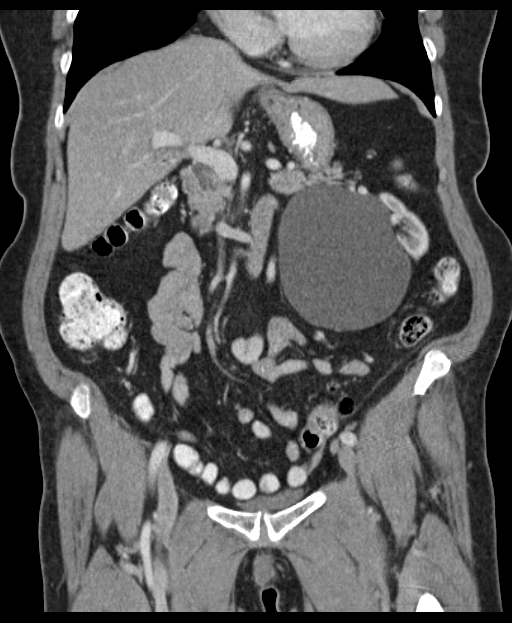
[im 74/134  soft-tissue]
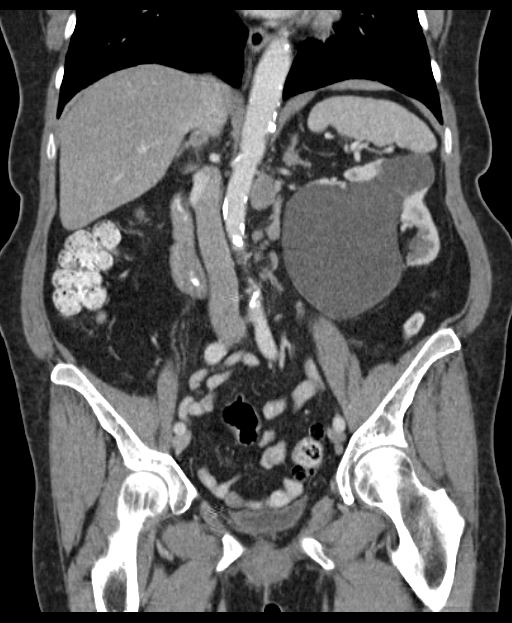

[15 of 46 positions shown; findings below may reference images not displayed]

FINDINGS: Lower chest: No consolidative opacities. No pleural effusion. Normal
heart size.

Hepatobiliary: Liver is normal in size and contour. No focal hepatic
lesion is identified. Status post cholecystectomy. No intrahepatic
or extrahepatic biliary ductal dilatation.

Pancreas: Stable 5 mm cystic lesion within the uncinate process
(image 28; series 2). No evidence for ductal dilatation. No
inflammatory process.

Spleen: Unremarkable

Adrenals/Urinary Tract: The adrenal glands are normal. Unchanged
11.7 x 8.7 cm cyst within the left renal sinus. Additionally there
is an unchanged 4 cm cyst within the superior pole of the left
kidney. Unchanged nonobstructing stone measuring 7 mm within the
inferior pole of the left kidney. Persistent mass effect from these
large cysts on the left renal collecting system which is mildly
dilated. The right kidney is unremarkable. Urinary bladder is
unremarkable.

Stomach/Bowel: Extensive sigmoid colonic diverticulosis. Mild
inflammatory change about a descending colonic diverticulum (image
49; series 2). No evidence for bowel obstruction.

Vascular/Lymphatic: Normal caliber abdominal aorta. Unchanged 2.3 x
2.2 cm left periaortic mass favored represent an enlarged lymph node
(image 27; series 2). Multiple additional predominantly sub cm
retroperitoneal lymph nodes are demonstrated.

Other: Status post hysterectomy.

Musculoskeletal: Lower lumbar spine degenerative changes. No
aggressive or acute appearing osseous lesions.
IMPRESSION: Focal inflammation about a diverticulum off of the descending colon
compatible with acute diverticulitis. No evidence for perforation or
surrounding abscess formation.

Re- demonstrated left periaortic mass favored to represent
adenopathy. This may represent metastatic disease or a
lymphoproliferative process such as lymphoma. Consider oncologic
referral and/or PET-CT for further evaluation.

Re- demonstrated dominant left renal cyst which results in mild left
caliectasis likely secondary to mass effect. Unchanged 7 mm
nonobstructing stone inferior pole left kidney.

## 2016-07-24 DIAGNOSIS — J301 Allergic rhinitis due to pollen: Secondary | ICD-10-CM | POA: Diagnosis not present

## 2016-07-25 ENCOUNTER — Other Ambulatory Visit: Payer: Self-pay | Admitting: Internal Medicine

## 2016-07-31 DIAGNOSIS — J301 Allergic rhinitis due to pollen: Secondary | ICD-10-CM | POA: Diagnosis not present

## 2016-08-07 DIAGNOSIS — J301 Allergic rhinitis due to pollen: Secondary | ICD-10-CM | POA: Diagnosis not present

## 2016-08-08 DIAGNOSIS — J301 Allergic rhinitis due to pollen: Secondary | ICD-10-CM | POA: Diagnosis not present

## 2016-08-14 ENCOUNTER — Other Ambulatory Visit: Payer: Self-pay | Admitting: Internal Medicine

## 2016-08-14 DIAGNOSIS — J301 Allergic rhinitis due to pollen: Secondary | ICD-10-CM | POA: Diagnosis not present

## 2016-08-15 ENCOUNTER — Telehealth: Payer: Self-pay | Admitting: Internal Medicine

## 2016-08-15 NOTE — Telephone Encounter (Signed)
PA started fro Hyoscyamine on cover my meds.

## 2016-08-17 ENCOUNTER — Telehealth: Payer: Self-pay

## 2016-08-17 NOTE — Telephone Encounter (Signed)
Received a letter from [employer] stating that they have denied Hyoscyamine. It states that the medication is not a part D eligible medication. Paperwork has been placed in your red folder.

## 2016-08-18 NOTE — Telephone Encounter (Signed)
Pt stated that she is taking this for IBS. She also stated that she is aware the insurance does not cover this medication and she just pays for it out of pocket.

## 2016-08-18 NOTE — Telephone Encounter (Signed)
Notify patient  Find out if she Is even using it anymore,  And for what symptom

## 2016-08-21 DIAGNOSIS — J301 Allergic rhinitis due to pollen: Secondary | ICD-10-CM | POA: Diagnosis not present

## 2016-08-22 ENCOUNTER — Other Ambulatory Visit: Payer: Self-pay | Admitting: Internal Medicine

## 2016-08-28 DIAGNOSIS — J301 Allergic rhinitis due to pollen: Secondary | ICD-10-CM | POA: Diagnosis not present

## 2016-08-29 LAB — HM DEXA SCAN

## 2016-09-04 ENCOUNTER — Ambulatory Visit (INDEPENDENT_AMBULATORY_CARE_PROVIDER_SITE_OTHER): Payer: Medicare Other

## 2016-09-04 ENCOUNTER — Telehealth: Payer: Self-pay

## 2016-09-04 VITALS — BP 142/70 | HR 61 | Temp 98.0°F | Resp 12 | Ht 63.0 in | Wt 150.8 lb

## 2016-09-04 DIAGNOSIS — Z0000 Encounter for general adult medical examination without abnormal findings: Secondary | ICD-10-CM | POA: Diagnosis not present

## 2016-09-04 DIAGNOSIS — J301 Allergic rhinitis due to pollen: Secondary | ICD-10-CM | POA: Diagnosis not present

## 2016-09-04 NOTE — Patient Instructions (Addendum)
  Whitney Molina , Thank you for taking time to come for your Medicare Wellness Visit. I appreciate your ongoing commitment to your health goals. Please review the following plan we discussed and let me know if I can assist you in the future.   Follow up with Dr. Derrel Nip as needed.    Bring a copy of your [place] and/or Living Will to be scanned into chart.  Have a great day! These are the goals we discussed: Goals    . Healthy Lifestyle          Stay active and walk for exercise daily. Stay hydrated and drink plenty of fluids. Low carb foods.  Educational material provided.       This is a list of the screening recommended for you and due dates:  Health Maintenance  Topic Date Due  . Flu Shot  12/13/2016  . Colon Cancer Screening  05/15/2017  . Tetanus Vaccine  11/22/2022  . DEXA scan (bone density measurement)  Completed  . Pneumonia vaccines  Completed

## 2016-09-04 NOTE — Progress Notes (Signed)
  I have reviewed the above information and agree with above.   Aria Pickrell, MD 

## 2016-09-04 NOTE — Progress Notes (Signed)
Subjective:   Whitney Molina is a 76 y.o. female who presents for Medicare Annual (Subsequent) preventive examination.  Review of Systems:  No ROS.  Medicare Wellness Visit.  Cardiac Risk Factors include: advanced age (>19men, >54 women);hypertension     Objective:     Vitals: BP (!) 142/70 (BP Location: Left Arm, Patient Position: Sitting, Cuff Size: Normal)   Pulse 61   Temp 98 F (36.7 C) (Oral)   Resp 12   Ht 5\' 3"  (1.6 m)   Wt 150 lb 12.8 oz (68.4 kg)   SpO2 98%   BMI 26.71 kg/m   Body mass index is 26.71 kg/m.   Tobacco History  Smoking Status  . Never Smoker  Smokeless Tobacco  . Never Used     Counseling given: Not Answered   Past Medical History:  Diagnosis Date  . Basal cell carcinoma of nose   . Bronchitis   . GERD (gastroesophageal reflux disease)   . Heart murmur   . Hemorrhoids   . History of colon polyps   . History of kidney stones   . History of mammogram 2016  . Hyperlipidemia   . Hypertension   . Primary squamous cell carcinoma of chest wall ([place]) 2004   removed at [place]   Past Surgical History:  Procedure Laterality Date  . ABDOMINAL HYSTERECTOMY  1984  . BLADDER SUSPENSION  2010  . CHOLECYSTECTOMY  06-01-03  . COLONOSCOPY  2003  . LITHOTRIPSY  2005  . local exicsion skin cancer  2004   squamous cell of chest wall. left chest  . TONSILLECTOMY  1971  . TUBAL LIGATION  1975  . VAGINAL PROLAPSE REPAIR  July 2001   Pelvic Prolapse   Family History  Problem Relation Age of Onset  . Cancer Mother     breast   . Stroke Mother   . Stroke Father   . Cancer Brother     bladder  . Stroke Brother    History  Sexual Activity  . Sexual activity: Yes    Outpatient Encounter Prescriptions as of 09/04/2016  Medication Sig  . Cholecalciferol (VITAMIN D3) 1000 units CAPS Take 1,000 Units by mouth daily.  Marland Kitchen escitalopram (LEXAPRO) 10 MG tablet TAKE ONE (1) TABLET EACH DAY  . hyoscyamine (LEVBID) 0.375 MG 12 hr tablet TAKE ONE  (1) TABLET BY MOUTH EVERY DAY  . losartan (COZAAR) 100 MG tablet TAKE ONE (1) TABLET BY MOUTH EVERY DAY  . metoprolol succinate (TOPROL-XL) 25 MG 24 hr tablet TAKE ONE (1) TABLET BY MOUTH EVERY DAY  . potassium chloride SA (K-DUR,KLOR-CON) 20 MEQ tablet TAKE ONE (1) TABLET EACH DAY  . Probiotic Product (HEALTHY COLON PO) Take 1 capsule by mouth daily.  . simvastatin (ZOCOR) 40 MG tablet TAKE ONE TABLET EACH EVENING  . triamterene-hydrochlorothiazide (MAXZIDE-25) 37.5-25 MG per tablet Take 0.5 tablets by mouth daily.  . [DISCONTINUED] atorvastatin (LIPITOR) 20 MG tablet Take 20 mg by mouth daily.   No facility-administered encounter medications on file as of 09/04/2016.     Activities of Daily Living In your present state of health, do you have any difficulty performing the following activities: 09/04/2016  Hearing? N  Vision? N  Difficulty concentrating or making decisions? N  Walking or climbing stairs? N  Dressing or bathing? N  Doing errands, shopping? N  Preparing Food and eating ? N  Using the Toilet? N  In the past six months, have you accidently leaked urine? Y  Do you have  problems with loss of bowel control? N  Managing your Medications? N  Managing your Finances? N  Housekeeping or managing your Housekeeping? N  Some recent data might be hidden    Patient Care Team: [name], MD as PCP - General (Internal Medicine) Robert Bellow, MD (General Surgery)    Assessment:    This is a routine wellness examination for [name].. The goal of the wellness visit is to assist the patient how to close the gaps in care and create a preventative care plan for the patient.   Taking calcium VIT D as appropriate/Osteoporosis reviewed.  Medications reviewed; taking without issues or barriers.  Safety issues reviewed; smoke detectors in the home. No firearms in the home. Wears seatbelts when driving or riding with others. Patient does wear sunscreen or protective  clothing when in direct sunlight. No violence in the home.  Patient is alert, normal appearance, oriented to person/place/and time. Correctly identified the president of the [place], recall of 2/3 objects, and performing simple calculations.  Patient displays appropriate judgement and can read correct time from watch face.  No new identified risk were noted.  No failures at ADL's or IADL's.   BMI- discussed the importance of a healthy diet, water intake and exercise. Educational material provided.   HTN- followed by PCP.  Dental- every six months.  Dr. Sharlett Iles.  Eye- Visual acuity not assessed per patient preference since they have regular follow up with the ophthalmologist.  Wears corrective lenses.  Sleep patterns- Sleeps 8 hours at night.  Wakes feeling rested.  Health maintenance gaps- closed.  Patient Concerns: None at this time. Follow up with PCP as needed. Exercise Activities and Dietary recommendations Current Exercise Habits: Home exercise routine, Time (Minutes): 30, Frequency (Times/Week): 5, Weekly Exercise (Minutes/Week): 150, Intensity: Mild  Goals    . Healthy Lifestyle          Stay active and walk for exercise daily. Stay hydrated and drink plenty of fluids. Low carb foods.  Educational material provided.      Fall Risk Fall Risk  09/04/2016 09/03/2015 05/20/2015 12/23/2014 12/18/2013  Falls in the past year? No No No No No   Depression Screen PHQ 2/9 Scores 09/04/2016 09/03/2015 05/20/2015 12/23/2014  PHQ - 2 Score 0 0 0 0     Cognitive Function MMSE - Mini Mental State Exam 09/04/2016 05/20/2015  Orientation to time 5 5  Orientation to Place 5 5  Registration 3 3  Attention/ Calculation 5 5  Recall 2 3  Language- name 2 objects 2 2  Language- repeat 1 1  Language- follow 3 step command 3 3  Language- read & follow direction 1 1  Write a sentence 1 1  Copy design 1 1  Total score 29 30        Immunization History  Administered Date(s) Administered  .  Influenza Split 01/16/2013, 02/26/2014  . Influenza, High Dose Seasonal PF 01/10/2016  . Influenza,inj,Quad PF,36+ Mos 04/21/2015  . Pneumococcal Conjugate-13 06/24/2014  . Pneumococcal-Unspecified 05/16/2011  . Td 11/12/2012  . Tdap 11/21/2012  . Zoster 02/23/2015   Screening Tests Health Maintenance  Topic Date Due  . INFLUENZA VACCINE  12/13/2016  . COLONOSCOPY  05/15/2017  . TETANUS/TDAP  11/22/2022  . DEXA SCAN  Completed  . PNA vac Low Risk Adult  Completed      Plan:   End of life planning; Advanced aging; Advanced directives discussed.  No HCPOA/Living Will.  Additional information declined at this time.  Medicare Attestation I have personally reviewed: The patient's medical and social history Their use of alcohol, tobacco or illicit drugs Their current medications and supplements The patient's functional ability including ADLs,fall risks, home safety risks, cognitive, and hearing and visual impairment Diet and physical activities Evidence for depression   The patient's weight, height, BMI, and visual acuity have been recorded in the chart.  I have made referrals and provided education to the patient based on review of the above and I have provided the patient with a written personalized care plan for preventive services.    During the course of the visit the patient was educated and counseled about the following appropriate screening and preventive services:   Vaccines to include Pneumoccal, Influenza, Hepatitis B, Td, Zostavax, HCV  Colorectal cancer screening-UTD  Bone density screening-UTD  Glaucoma screening-annual eye exam  Mammography-UTD  Nutrition counseling   Patient Instructions (the written plan) was given to the patient.   Varney Biles, LPN  01/16/8332

## 2016-09-04 NOTE — Telephone Encounter (Signed)
If possible, patient requests her 30 day potassium medication be changed to 90 day Rx.

## 2016-09-06 MED ORDER — POTASSIUM CHLORIDE CRYS ER 20 MEQ PO TBCR: EXTENDED_RELEASE_TABLET | ORAL | 0 refills | Status: DC

## 2016-09-06 NOTE — Addendum Note (Signed)
Addended by: Adair Laundry on: 09/06/2016 09:15 AM   Modules accepted: Orders

## 2016-09-06 NOTE — Telephone Encounter (Signed)
Medication was changed to 90 day supply.

## 2016-09-11 DIAGNOSIS — J301 Allergic rhinitis due to pollen: Secondary | ICD-10-CM | POA: Diagnosis not present

## 2016-09-18 DIAGNOSIS — J301 Allergic rhinitis due to pollen: Secondary | ICD-10-CM | POA: Diagnosis not present

## 2016-09-25 DIAGNOSIS — J301 Allergic rhinitis due to pollen: Secondary | ICD-10-CM | POA: Diagnosis not present

## 2016-10-02 DIAGNOSIS — J301 Allergic rhinitis due to pollen: Secondary | ICD-10-CM | POA: Diagnosis not present

## 2016-10-12 DIAGNOSIS — J301 Allergic rhinitis due to pollen: Secondary | ICD-10-CM | POA: Diagnosis not present

## 2016-10-19 DIAGNOSIS — J301 Allergic rhinitis due to pollen: Secondary | ICD-10-CM | POA: Diagnosis not present

## 2016-10-26 DIAGNOSIS — J301 Allergic rhinitis due to pollen: Secondary | ICD-10-CM | POA: Diagnosis not present

## 2016-11-03 DIAGNOSIS — J301 Allergic rhinitis due to pollen: Secondary | ICD-10-CM | POA: Diagnosis not present

## 2016-11-06 DIAGNOSIS — J301 Allergic rhinitis due to pollen: Secondary | ICD-10-CM | POA: Diagnosis not present

## 2016-11-23 DIAGNOSIS — J301 Allergic rhinitis due to pollen: Secondary | ICD-10-CM | POA: Diagnosis not present

## 2016-11-27 DIAGNOSIS — J301 Allergic rhinitis due to pollen: Secondary | ICD-10-CM | POA: Diagnosis not present

## 2016-12-04 DIAGNOSIS — J301 Allergic rhinitis due to pollen: Secondary | ICD-10-CM | POA: Diagnosis not present

## 2016-12-11 DIAGNOSIS — J301 Allergic rhinitis due to pollen: Secondary | ICD-10-CM | POA: Diagnosis not present

## 2016-12-18 DIAGNOSIS — J301 Allergic rhinitis due to pollen: Secondary | ICD-10-CM | POA: Diagnosis not present

## 2016-12-19 ENCOUNTER — Telehealth: Payer: Self-pay | Admitting: *Deleted

## 2016-12-19 NOTE — Telephone Encounter (Signed)
Pt called requested to speak to rn regarding apt. Call returned at 1630 today.  She needs to r/s due to illness. Pt has a fever (101). and head cold/congestion. She took 1 tyelnol 325 mg 45 mins. Ago. Requesting to r/s lab/md 8/8 apt to next Wednesday. 8/15. msg sent to sch. To r/s apt for labs at 930am and md apt at 945 am on 8/15.   Pt will go to pcp tom if symptoms do not improve. Pt did not want to expose other patients to her cold germs.

## 2016-12-20 ENCOUNTER — Other Ambulatory Visit: Payer: Medicare Other

## 2016-12-20 ENCOUNTER — Inpatient Hospital Stay: Payer: Medicare Other | Admitting: Internal Medicine

## 2016-12-20 ENCOUNTER — Inpatient Hospital Stay: Payer: Medicare Other

## 2016-12-20 ENCOUNTER — Ambulatory Visit: Payer: Medicare Other | Admitting: Internal Medicine

## 2016-12-20 ENCOUNTER — Other Ambulatory Visit: Payer: Self-pay | Admitting: Internal Medicine

## 2016-12-20 NOTE — Progress Notes (Deleted)
[place] OFFICE PROGRESS NOTE  Patient Care Team: [name], MD as PCP - General (Internal Medicine) Bary Castilla Forest Gleason, MD (General Surgery)   SUMMARY OF ONCOLOGIC HISTORY: Oncology History    # NOV 6295- FOLLICULAR LYMPHOMA G-2; [s/p Bx- RETROPERITONEAL/LEFT Peri-aortic LN ~ 2.5CM [incidental on CT scan in 2016; CT- Nov 2014- ~1CM]/ PET 2016-Left Peri-aortic LN; Suv ~15; Ext Iliac LN 6 mm; Feb 2017- Unchanged LN; START Rituxan q week x4 [finished march 15th]; PET JAN 30th 2018- NED.   # Hx of Shingles [Sep 2841]-      Follicular lymphoma grade ii, intra-abdominal lymph nodes (HCC)      INTERVAL HISTORY: A very pleasant 76 year old female patient with history of follicular lymphoma grade 2 retroperitoneal is currently status post 4 infusions of rituximab single agent is here for follow-up/ review the results of PET scan.   Continues to deny any weight loss or any night sweats or fevers. No nausea no vomiting. Appetite is good. No abdominal pain.    REVIEW OF SYSTEMS:  A complete 10 point review of system is done which is negative except mentioned above/history of present illness.   PAST MEDICAL HISTORY :  Past Medical History:  Diagnosis Date  . Basal cell carcinoma of nose   . Bronchitis   . GERD (gastroesophageal reflux disease)   . Heart murmur   . Hemorrhoids   . History of colon polyps   . History of kidney stones   . History of mammogram 2016  . Hyperlipidemia   . Hypertension   . 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 ABDOMINAL HYSTERECTOMY  1984  . BLADDER SUSPENSION  2010  . CHOLECYSTECTOMY  06-01-03  . COLONOSCOPY  2003  . LITHOTRIPSY  2005  . local exicsion skin cancer  2004   squamous cell of chest wall. left chest  . TONSILLECTOMY  1971  . TUBAL LIGATION  1975  . VAGINAL PROLAPSE REPAIR  July 2001   Pelvic  Prolapse    FAMILY HISTORY :   Family History  Problem Relation Age of Onset  . Cancer Mother        breast   . Stroke Mother   . Stroke Father   . Cancer Brother        bladder  . Stroke Brother     SOCIAL HISTORY:   Social History  Substance Use Topics  . Smoking status: Never Smoker  . Smokeless tobacco: Never Used  . Alcohol use No    ALLERGIES:  has No Known Allergies.  MEDICATIONS:  Current Outpatient Prescriptions  Medication Sig Dispense Refill  . Cholecalciferol (VITAMIN D3) 1000 units CAPS Take 1,000 Units by mouth daily.    Marland Kitchen escitalopram (LEXAPRO) 10 MG tablet TAKE ONE (1) TABLET EACH DAY 90 tablet 2  . hyoscyamine (LEVBID) 0.375 MG 12 hr tablet TAKE ONE (1) TABLET BY MOUTH EVERY DAY 90 tablet 1  . losartan (COZAAR) 100 MG tablet TAKE ONE (1) TABLET BY MOUTH EVERY DAY 90 tablet 1  . metoprolol succinate (TOPROL-XL) 25 MG 24 hr tablet TAKE ONE (1) TABLET BY MOUTH EVERY DAY 90 tablet 3  . potassium chloride SA (K-DUR,KLOR-CON) 20 MEQ tablet TAKE ONE (1) TABLET EACH DAY 90 tablet 0  . Probiotic Product (HEALTHY COLON PO) Take 1 capsule by mouth daily.    Marland Kitchen  simvastatin (ZOCOR) 40 MG tablet TAKE ONE TABLET EACH EVENING 90 tablet 1  . triamterene-hydrochlorothiazide (MAXZIDE-25) 37.5-25 MG per tablet Take 0.5 tablets by mouth daily. 90 tablet 3   No current facility-administered medications for this visit.     PHYSICAL EXAMINATION: ECOG PERFORMANCE STATUS: 0 - Asymptomatic  There were no vitals taken for this visit.  There were no vitals filed for this visit. GENERAL: Well-nourished well-developed; Alert, no distress and comfortable. Accompanied her husband. EYES: no pallor or icterus OROPHARYNX: no thrush or ulceration; good dentition  NECK: supple, no masses felt LYMPH: no palpable lymphadenopathy in the cervical, axillary or inguinal regions LUNGS: clear to auscultation and No wheeze or crackles HEART/CVS: regular rate & rhythm and no murmurs; No  lower extremity edema ABDOMEN: abdomen soft, non-tender and normal bowel sounds Musculoskeletal:no cyanosis of digits and no clubbing  PSYCH: alert & oriented x 3 with fluent speech NEURO: no focal motor/sensory deficits SKIN: no rashes or significant lesions  LABORATORY DATA:  I have reviewed the data as listed    Component Value Date/Time   NA 133 (L) 06/21/2016 1421   NA 138 09/10/2013 0758   K 3.7 06/21/2016 1421   K 3.3 (L) 09/10/2013 0758   CL 99 (L) 06/21/2016 1421   CL 100 09/10/2013 0758   CO2 30 06/21/2016 1421   CO2 32 09/10/2013 0758   GLUCOSE 87 06/21/2016 1421   GLUCOSE 85 09/10/2013 0758   BUN 12 06/21/2016 1421   BUN 13 09/10/2013 0758   CREATININE 0.81 06/21/2016 1421   CREATININE 0.96 12/22/2013 0857   CALCIUM 8.9 06/21/2016 1421   CALCIUM 9.2 09/10/2013 0758   PROT 8.1 06/21/2016 1421   PROT 8.3 (H) 09/10/2013 0758   ALBUMIN 4.3 06/21/2016 1421   ALBUMIN 3.5 09/10/2013 0758   AST 27 06/21/2016 1421   AST 23 09/10/2013 0758   ALT 25 06/21/2016 1421   ALT 28 09/10/2013 0758   ALKPHOS 71 06/21/2016 1421   ALKPHOS 78 09/10/2013 0758   BILITOT 0.7 06/21/2016 1421   BILITOT 0.6 09/10/2013 0758   GFRNONAA >60 06/21/2016 1421   GFRNONAA 59 (L) 12/22/2013 0857   GFRAA >60 06/21/2016 1421   GFRAA 68 12/22/2013 0857    No results found for: SPEP, UPEP  Lab Results  Component Value Date   WBC 8.1 06/21/2016   NEUTROABS 5.0 06/21/2016   HGB 13.1 06/21/2016   HCT 37.2 06/21/2016   MCV 91.9 06/21/2016   PLT 235 06/21/2016      Chemistry      Component Value Date/Time   NA 133 (L) 06/21/2016 1421   NA 138 09/10/2013 0758   K 3.7 06/21/2016 1421   K 3.3 (L) 09/10/2013 0758   CL 99 (L) 06/21/2016 1421   CL 100 09/10/2013 0758   CO2 30 06/21/2016 1421   CO2 32 09/10/2013 0758   BUN 12 06/21/2016 1421   BUN 13 09/10/2013 0758   CREATININE 0.81 06/21/2016 1421   CREATININE 0.96 12/22/2013 0857      Component Value Date/Time   CALCIUM 8.9  06/21/2016 1421   CALCIUM 9.2 09/10/2013 0758   ALKPHOS 71 06/21/2016 1421   ALKPHOS 78 09/10/2013 0758   AST 27 06/21/2016 1421   AST 23 09/10/2013 0758   ALT 25 06/21/2016 1421   ALT 28 09/10/2013 0758   BILITOT 0.7 06/21/2016 1421   BILITOT 0.6 09/10/2013 0758        ASSESSMENT & PLAN:  No problem-specific Assessment &  Plan notes found for this encounter.       Cammie Sickle, MD 12/20/2016 7:59 AM

## 2016-12-20 NOTE — Assessment & Plan Note (Deleted)
#   RETROPERITONEAL LN [incidental/asymptomatic]- follicular lymphoma grade 2; likely stage II; Patient tolerated Rituxan weekly 4 Very well. JAN 30th PET 2018- NED.   # Hx of shingles- no recurrence at this time. Discussed to avoid live shingles vaccine.   # Large stable benign renal cyst normal kidney function. No recommendations at this time [previously followed by Dr.wolfe]  # Follow up in 6 months/labs/ no scans.   # I reviewed the blood work- with the patient in detail; also reviewed the imaging independently [as summarized above]; and with the patient in detail.   # 25 minutes face-to-face with the patient discussing the above plan of care; more than 50% of time spent on prognosis/ natural history; counseling and coordination.  Cc: Dr.Tullo

## 2016-12-21 DIAGNOSIS — J45901 Unspecified asthma with (acute) exacerbation: Secondary | ICD-10-CM | POA: Diagnosis not present

## 2016-12-21 DIAGNOSIS — J301 Allergic rhinitis due to pollen: Secondary | ICD-10-CM | POA: Diagnosis not present

## 2016-12-21 DIAGNOSIS — R05 Cough: Secondary | ICD-10-CM | POA: Diagnosis not present

## 2016-12-25 DIAGNOSIS — J301 Allergic rhinitis due to pollen: Secondary | ICD-10-CM | POA: Diagnosis not present

## 2016-12-27 ENCOUNTER — Inpatient Hospital Stay: Payer: Medicare Other | Admitting: Internal Medicine

## 2016-12-27 ENCOUNTER — Inpatient Hospital Stay: Payer: Medicare Other

## 2017-01-01 DIAGNOSIS — J301 Allergic rhinitis due to pollen: Secondary | ICD-10-CM | POA: Diagnosis not present

## 2017-01-02 ENCOUNTER — Ambulatory Visit (INDEPENDENT_AMBULATORY_CARE_PROVIDER_SITE_OTHER): Payer: Medicare Other | Admitting: Family

## 2017-01-02 ENCOUNTER — Ambulatory Visit (INDEPENDENT_AMBULATORY_CARE_PROVIDER_SITE_OTHER): Payer: Medicare Other

## 2017-01-02 ENCOUNTER — Encounter: Payer: Self-pay | Admitting: Family

## 2017-01-02 VITALS — BP 120/88 | HR 70 | Temp 98.5°F | Resp 14 | Wt 149.4 lb

## 2017-01-02 DIAGNOSIS — J40 Bronchitis, not specified as acute or chronic: Secondary | ICD-10-CM | POA: Diagnosis not present

## 2017-01-02 DIAGNOSIS — J449 Chronic obstructive pulmonary disease, unspecified: Secondary | ICD-10-CM | POA: Diagnosis not present

## 2017-01-02 MED ORDER — CEFDINIR 300 MG PO CAPS: 300.0000 mg | ORAL_CAPSULE | Freq: Two times a day (BID) | ORAL | 0 refills | Status: DC

## 2017-01-02 NOTE — Progress Notes (Signed)
Pre-visit discussion using our clinic review tool. No additional management support is needed unless otherwise documented below in the visit note.  

## 2017-01-02 NOTE — Patient Instructions (Signed)
Chest Xray today  Continue to use albuterol inhaler  May also start PLAIN mucinex to help break up congestion  Start antibiotic  Ensure to take probiotics while on antibiotics and also for 2 weeks after completion. It is important to re-colonize the gut with good bacteria and also to prevent any diarrheal infections associated with antibiotic use.   Honey for cough as well as tessalon perles as needed  If there is no improvement in your symptoms, or if there is any worsening of symptoms, or if you have any additional concerns, please return for re-evaluation; or, if we are closed, consider going to the Emergency Room for evaluation if symptoms urgent.

## 2017-01-02 NOTE — Progress Notes (Signed)
Subjective:    Patient ID: Whitney Molina, female    DOB: March 07, 1941, 76 y.o.   MRN: [id number]  CC: Whitney Molina is a 76 y.o. female who presents today for an acute visit.    HPI: CC: productive cough x 3 weeks, unchanged.   Notes 2 months ago started to have sinus congestion, sneezing. Notes had a fever 2 months ago, resolved at this time. Follows with allergy, Dr Richardson Landry, for allergy shots. Allergist had given 6 day prednisone taper, albuterol inhaler with some relief last week. Feels like more congestion was breaking up after prednisone.   Coughing thorough day and night. No wheezing, SOB, CP, sinus pressure, ear pain, sore throat.hasnt tried any medications     Never smoker  H/o bronchiectasis    h/o lymphoma  HISTORY:  Past Medical History:  Diagnosis Date  . Basal cell carcinoma of nose   . Bronchitis   . GERD (gastroesophageal reflux disease)   . Heart murmur   . Hemorrhoids   . History of colon polyps   . History of kidney stones   . History of mammogram 2016  . Hyperlipidemia   . Hypertension   . 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 ABDOMINAL HYSTERECTOMY  1984  . BLADDER SUSPENSION  2010  . CHOLECYSTECTOMY  06-01-03  . COLONOSCOPY  2003  . LITHOTRIPSY  2005  . local exicsion skin cancer  2004   squamous cell of chest wall. left chest  . TONSILLECTOMY  1971  . TUBAL LIGATION  1975  . VAGINAL PROLAPSE REPAIR  July 2001   Pelvic Prolapse   Family History  Problem Relation Age of Onset  . Cancer Mother        breast   . Stroke Mother   . Stroke Father   . Cancer Brother        bladder  . Stroke Brother     Allergies: Patient has no known allergies. Current Outpatient Prescriptions on File Prior to Visit  Medication Sig Dispense Refill  . Cholecalciferol (VITAMIN D3) 1000 units CAPS Take 1,000 Units by mouth daily.    Marland Kitchen escitalopram  (LEXAPRO) 10 MG tablet TAKE ONE (1) TABLET BY MOUTH EVERY DAY 90 tablet 0  . hyoscyamine (LEVBID) 0.375 MG 12 hr tablet TAKE ONE (1) TABLET BY MOUTH EVERY DAY 90 tablet 1  . losartan (COZAAR) 100 MG tablet TAKE ONE (1) TABLET BY MOUTH EVERY DAY 90 tablet 1  . metoprolol succinate (TOPROL-XL) 25 MG 24 hr tablet TAKE ONE (1) TABLET BY MOUTH EVERY DAY 90 tablet 3  . potassium chloride SA (K-DUR,KLOR-CON) 20 MEQ tablet TAKE ONE (1) TABLET EACH DAY 90 tablet 0  . Probiotic Product (HEALTHY COLON PO) Take 1 capsule by mouth daily.    . simvastatin (ZOCOR) 40 MG tablet TAKE ONE TABLET EACH EVENING 90 tablet 1  . triamterene-hydrochlorothiazide (MAXZIDE-25) 37.5-25 MG per tablet Take 0.5 tablets by mouth daily. 90 tablet 3   No current facility-administered medications on file prior to visit.     Social History  Substance Use Topics  . Smoking status: Never Smoker  . Smokeless tobacco: Never Used  . Alcohol use No    Review of Systems  Constitutional: Negative for chills and fever.  HENT: Positive for congestion.   Respiratory: Positive for cough.   Cardiovascular: Negative for chest pain and palpitations.  Gastrointestinal: Negative  for nausea and vomiting.      Objective:    BP 120/88 (BP Location: Left Arm, Patient Position: Sitting, Cuff Size: Normal)   Pulse 70   Temp 98.5 F (36.9 C) (Oral)   Resp 14   Wt 149 lb 6.4 oz (67.8 kg)   SpO2 96%   BMI 26.47 kg/m    Physical Exam  Constitutional: She appears well-developed and well-nourished.  HENT:  Head: Normocephalic and atraumatic.  Right Ear: Hearing, tympanic membrane, external ear and ear canal normal. No drainage, swelling or tenderness. No foreign bodies. Tympanic membrane is not erythematous and not bulging. No middle ear effusion. No decreased hearing is noted.  Left Ear: Hearing, tympanic membrane, external ear and ear canal normal. No drainage, swelling or tenderness. No foreign bodies. Tympanic membrane is not  erythematous and not bulging.  No middle ear effusion. No decreased hearing is noted.  Nose: Nose normal. No rhinorrhea. Right sinus exhibits no maxillary sinus tenderness and no frontal sinus tenderness. Left sinus exhibits no maxillary sinus tenderness and no frontal sinus tenderness.  Mouth/Throat: Uvula is midline, oropharynx is clear and moist and mucous membranes are normal. No oropharyngeal exudate, posterior oropharyngeal edema, posterior oropharyngeal erythema or tonsillar abscesses.  Eyes: Conjunctivae are normal.  Cardiovascular: Regular rhythm, normal heart sounds and normal pulses.   Pulmonary/Chest: Effort normal and breath sounds normal. She has no wheezes. She has no rhonchi. She has no rales.  Lymphadenopathy:       Head (right side): No submental, no submandibular, no tonsillar, no preauricular, no posterior auricular and no occipital adenopathy present.       Head (left side): No submental, no submandibular, no tonsillar, no preauricular, no posterior auricular and no occipital adenopathy present.    She has no cervical adenopathy.  Neurological: She is alert.  Skin: Skin is warm and dry.  Psychiatric: She has a normal mood and affect. Her speech is normal and behavior is normal. Thought content normal.  Vitals reviewed.      Assessment & Plan:   1. Bronchitis Afebrile. No acute respiratory distress Or adventitious lung sounds appreciated on exam. Sa02 96. Based on duration of symptoms, patient and I jointly agreed to start antibiotic therapy. Pending chest x-ray as well. Return precautions given.  - cefdinir (OMNICEF) 300 MG capsule; Take 1 capsule (300 mg total) by mouth 2 (two) times daily.  Dispense: 20 capsule; Refill: 0 - DG Chest 2 View    I am having Whitney Molina maintain her triamterene-hydrochlorothiazide, simvastatin, losartan, hyoscyamine, metoprolol succinate, Vitamin D3, Probiotic Product (HEALTHY COLON PO), potassium chloride SA, escitalopram, and  VENTOLIN HFA.   Meds ordered this encounter  Medications  . VENTOLIN HFA 108 (90 Base) MCG/ACT inhaler    Return precautions given.   Risks, benefits, and alternatives of the medications and treatment plan prescribed today were discussed, and patient expressed understanding.   Education regarding symptom management and diagnosis given to patient on AVS.  Continue to follow with [name], MD for routine health maintenance.   Whitney Molina and I agreed with plan.   Mable Paris, FNP

## 2017-01-03 ENCOUNTER — Other Ambulatory Visit: Payer: Self-pay | Admitting: Family

## 2017-01-03 DIAGNOSIS — J40 Bronchitis, not specified as acute or chronic: Secondary | ICD-10-CM

## 2017-01-08 DIAGNOSIS — J301 Allergic rhinitis due to pollen: Secondary | ICD-10-CM | POA: Diagnosis not present

## 2017-01-10 ENCOUNTER — Inpatient Hospital Stay: Payer: Medicare Other | Attending: Internal Medicine

## 2017-01-10 ENCOUNTER — Inpatient Hospital Stay (HOSPITAL_BASED_OUTPATIENT_CLINIC_OR_DEPARTMENT_OTHER): Payer: Medicare Other | Admitting: Internal Medicine

## 2017-01-10 VITALS — BP 133/81 | HR 67 | Temp 97.6°F | Resp 18 | Ht 63.0 in | Wt 150.0 lb

## 2017-01-10 DIAGNOSIS — Z85828 Personal history of other malignant neoplasm of skin: Secondary | ICD-10-CM

## 2017-01-10 DIAGNOSIS — N281 Cyst of kidney, acquired: Secondary | ICD-10-CM | POA: Diagnosis not present

## 2017-01-10 DIAGNOSIS — Z8052 Family history of malignant neoplasm of bladder: Secondary | ICD-10-CM | POA: Diagnosis not present

## 2017-01-10 DIAGNOSIS — Z86010 Personal history of colonic polyps: Secondary | ICD-10-CM | POA: Insufficient documentation

## 2017-01-10 DIAGNOSIS — Z79899 Other long term (current) drug therapy: Secondary | ICD-10-CM | POA: Diagnosis not present

## 2017-01-10 DIAGNOSIS — I10 Essential (primary) hypertension: Secondary | ICD-10-CM | POA: Diagnosis not present

## 2017-01-10 DIAGNOSIS — Z803 Family history of malignant neoplasm of breast: Secondary | ICD-10-CM

## 2017-01-10 DIAGNOSIS — R011 Cardiac murmur, unspecified: Secondary | ICD-10-CM

## 2017-01-10 DIAGNOSIS — C8213 Follicular lymphoma grade II, intra-abdominal lymph nodes: Secondary | ICD-10-CM | POA: Diagnosis not present

## 2017-01-10 DIAGNOSIS — E785 Hyperlipidemia, unspecified: Secondary | ICD-10-CM | POA: Insufficient documentation

## 2017-01-10 DIAGNOSIS — Z87442 Personal history of urinary calculi: Secondary | ICD-10-CM

## 2017-01-10 DIAGNOSIS — K219 Gastro-esophageal reflux disease without esophagitis: Secondary | ICD-10-CM

## 2017-01-10 LAB — COMPREHENSIVE METABOLIC PANEL
ALT: 21 U/L (ref 14–54)
ANION GAP: 7 (ref 5–15)
AST: 21 U/L (ref 15–41)
Albumin: 4.1 g/dL (ref 3.5–5.0)
Alkaline Phosphatase: 70 U/L (ref 38–126)
BILIRUBIN TOTAL: 0.6 mg/dL (ref 0.3–1.2)
BUN: 15 mg/dL (ref 6–20)
CALCIUM: 9.2 mg/dL (ref 8.9–10.3)
CO2: 30 mmol/L (ref 22–32)
Chloride: 99 mmol/L — ABNORMAL LOW (ref 101–111)
Creatinine, Ser: 1.15 mg/dL — ABNORMAL HIGH (ref 0.44–1.00)
GFR calc Af Amer: 53 mL/min — ABNORMAL LOW (ref >60)
GFR, EST NON AFRICAN AMERICAN: 45 mL/min — AB (ref >60)
Glucose, Bld: 90 mg/dL (ref 65–99)
POTASSIUM: 3.8 mmol/L (ref 3.5–5.1)
Sodium: 136 mmol/L (ref 135–145)
TOTAL PROTEIN: 7.8 g/dL (ref 6.5–8.1)

## 2017-01-10 LAB — CBC WITH DIFFERENTIAL/PLATELET
BASOS PCT: 1 %
Basophils Absolute: 0 10*3/uL (ref 0–0.1)
Eosinophils Absolute: 0.3 10*3/uL (ref 0–0.7)
Eosinophils Relative: 4 %
HEMATOCRIT: 37.1 % (ref 35.0–47.0)
Hemoglobin: 12.9 g/dL (ref 12.0–16.0)
LYMPHS PCT: 29 %
Lymphs Abs: 2 10*3/uL (ref 1.0–3.6)
MCH: 32.4 pg (ref 26.0–34.0)
MCHC: 34.7 g/dL (ref 32.0–36.0)
MCV: 93.3 fL (ref 80.0–100.0)
MONO ABS: 0.7 10*3/uL (ref 0.2–0.9)
Monocytes Relative: 10 %
NEUTROS ABS: 4 10*3/uL (ref 1.4–6.5)
Neutrophils Relative %: 56 %
Platelets: 237 10*3/uL (ref 150–440)
RBC: 3.98 MIL/uL (ref 3.80–5.20)
RDW: 13.4 % (ref 11.5–14.5)
WBC: 6.9 10*3/uL (ref 3.6–11.0)

## 2017-01-10 LAB — LACTATE DEHYDROGENASE: LDH: 169 U/L (ref 98–192)

## 2017-01-10 NOTE — Progress Notes (Signed)
[place] OFFICE PROGRESS NOTE  Patient Care Team: [name], MD as PCP - General (Internal Medicine) Bary Castilla Forest Gleason, MD (General Surgery)   SUMMARY OF ONCOLOGIC HISTORY: Oncology History    # NOV 4944- FOLLICULAR LYMPHOMA G-2; [s/p Bx- RETROPERITONEAL/LEFT Peri-aortic LN ~ 2.5CM [incidental on CT scan in 2016; CT- Nov 2014- ~1CM]/ PET 2016-Left Peri-aortic LN; Suv ~15; Ext Iliac LN 6 mm; Feb 2017- Unchanged LN; START Rituxan q week x4 [finished march 15th]; PET JAN 30th 2018- NED.   # Hx of Shingles [Sep 9675]-      Follicular lymphoma grade ii, intra-abdominal lymph nodes (HCC)      INTERVAL HISTORY: A very pleasant 76 year old female patient with history of follicular lymphoma grade 2 retroperitoneal is currently status post 4 infusions of rituximab single agent [Finished February 2017] is here for follow-up.   The interim patient was diagnosed with bronchitis; treated with prednisone/currently on oral cephalosporin. Chest x-ray negative. Symptoms improving overall.  Appetite has been "too good"- on the prednisone.  Continues to deny any weight loss or any night sweats or fevers. No nausea no vomiting. No abdominal pain.    REVIEW OF SYSTEMS:  A complete 10 point review of system is done which is negative except mentioned above/history of present illness.   PAST MEDICAL HISTORY :  Past Medical History:  Diagnosis Date  . Basal cell carcinoma of nose   . Bronchitis   . GERD (gastroesophageal reflux disease)   . Heart murmur   . Hemorrhoids   . History of colon polyps   . History of kidney stones   . History of mammogram 2016  . Hyperlipidemia   . Hypertension   . 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 ABDOMINAL HYSTERECTOMY  1984  . BLADDER SUSPENSION  2010  . CHOLECYSTECTOMY  06-01-03  . COLONOSCOPY  2003  .  LITHOTRIPSY  2005  . local exicsion skin cancer  2004   squamous cell of chest wall. left chest  . TONSILLECTOMY  1971  . TUBAL LIGATION  1975  . VAGINAL PROLAPSE REPAIR  July 2001   Pelvic Prolapse    FAMILY HISTORY :   Family History  Problem Relation Age of Onset  . Cancer Mother        breast   . Stroke Mother   . Stroke Father   . Cancer Brother        bladder  . Stroke Brother     SOCIAL HISTORY:   Social History  Substance Use Topics  . Smoking status: Never Smoker  . Smokeless tobacco: Never Used  . Alcohol use No    ALLERGIES:  has No Known Allergies.  MEDICATIONS:  Current Outpatient Prescriptions  Medication Sig Dispense Refill  . cefdinir (OMNICEF) 300 MG capsule Take 1 capsule (300 mg total) by mouth 2 (two) times daily. 20 capsule 0  . Cholecalciferol (VITAMIN D3) 1000 units CAPS Take 1,000 Units by mouth daily.    Marland Kitchen escitalopram (LEXAPRO) 10 MG tablet TAKE ONE (1) TABLET BY MOUTH EVERY DAY 90 tablet 0  . hyoscyamine (LEVBID) 0.375 MG 12 hr tablet TAKE ONE (1) TABLET BY MOUTH EVERY DAY 90 tablet 1  . losartan (COZAAR) 100 MG tablet TAKE ONE (1) TABLET BY MOUTH EVERY DAY 90 tablet 1  . metoprolol succinate (TOPROL-XL) 25 MG 24  hr tablet TAKE ONE (1) TABLET BY MOUTH EVERY DAY 90 tablet 3  . potassium chloride SA (K-DUR,KLOR-CON) 20 MEQ tablet TAKE ONE (1) TABLET EACH DAY 90 tablet 0  . Probiotic Product (HEALTHY COLON PO) Take 1 capsule by mouth daily.    . simvastatin (ZOCOR) 40 MG tablet TAKE ONE TABLET EACH EVENING 90 tablet 1  . triamterene-hydrochlorothiazide (MAXZIDE-25) 37.5-25 MG per tablet Take 0.5 tablets by mouth daily. 90 tablet 3  . VENTOLIN HFA 108 (90 Base) MCG/ACT inhaler Inhale 1 puff into the lungs every 6 (six) hours as needed for wheezing or shortness of breath.      No current facility-administered medications for this visit.     PHYSICAL EXAMINATION: ECOG PERFORMANCE STATUS: 0 - Asymptomatic  BP 133/81 (BP Location: Left Arm,  Patient Position: Sitting)   Pulse 67   Temp 97.6 F (36.4 C) (Tympanic)   Resp 18   Ht 5\' 3"  (1.6 m)   Wt 150 lb (68 kg)   BMI 26.57 kg/m   Filed Weights   01/10/17 1123  Weight: 150 lb (68 kg)   GENERAL: Well-nourished well-developed; Alert, no distress and comfortable. Accompanied her husband. EYES: no pallor or icterus OROPHARYNX: no thrush or ulceration; good dentition  NECK: supple, no masses felt LYMPH: no palpable lymphadenopathy in the cervical, axillary or inguinal regions LUNGS: clear to auscultation and No wheeze or crackles HEART/CVS: regular rate & rhythm and no murmurs; No lower extremity edema ABDOMEN: abdomen soft, non-tender and normal bowel sounds Musculoskeletal:no cyanosis of digits and no clubbing  PSYCH: alert & oriented x 3 with fluent speech NEURO: no focal motor/sensory deficits SKIN: no rashes or significant lesions  LABORATORY DATA:  I have reviewed the data as listed    Component Value Date/Time   NA 136 01/10/2017 1101   NA 138 09/10/2013 0758   K 3.8 01/10/2017 1101   K 3.3 (L) 09/10/2013 0758   CL 99 (L) 01/10/2017 1101   CL 100 09/10/2013 0758   CO2 30 01/10/2017 1101   CO2 32 09/10/2013 0758   GLUCOSE 90 01/10/2017 1101   GLUCOSE 85 09/10/2013 0758   BUN 15 01/10/2017 1101   BUN 13 09/10/2013 0758   CREATININE 1.15 (H) 01/10/2017 1101   CREATININE 0.96 12/22/2013 0857   CALCIUM 9.2 01/10/2017 1101   CALCIUM 9.2 09/10/2013 0758   PROT 7.8 01/10/2017 1101   PROT 8.3 (H) 09/10/2013 0758   ALBUMIN 4.1 01/10/2017 1101   ALBUMIN 3.5 09/10/2013 0758   AST 21 01/10/2017 1101   AST 23 09/10/2013 0758   ALT 21 01/10/2017 1101   ALT 28 09/10/2013 0758   ALKPHOS 70 01/10/2017 1101   ALKPHOS 78 09/10/2013 0758   BILITOT 0.6 01/10/2017 1101   BILITOT 0.6 09/10/2013 0758   GFRNONAA 45 (L) 01/10/2017 1101   GFRNONAA 59 (L) 12/22/2013 0857   GFRAA 53 (L) 01/10/2017 1101   GFRAA 68 12/22/2013 0857    No results found for: SPEP,  UPEP  Lab Results  Component Value Date   WBC 6.9 01/10/2017   NEUTROABS 4.0 01/10/2017   HGB 12.9 01/10/2017   HCT 37.1 01/10/2017   MCV 93.3 01/10/2017   PLT 237 01/10/2017      Chemistry      Component Value Date/Time   NA 136 01/10/2017 1101   NA 138 09/10/2013 0758   K 3.8 01/10/2017 1101   K 3.3 (L) 09/10/2013 0758   CL 99 (L) 01/10/2017 1101   CL  100 09/10/2013 0758   CO2 30 01/10/2017 1101   CO2 32 09/10/2013 0758   BUN 15 01/10/2017 1101   BUN 13 09/10/2013 0758   CREATININE 1.15 (H) 01/10/2017 1101   CREATININE 0.96 12/22/2013 0857      Component Value Date/Time   CALCIUM 9.2 01/10/2017 1101   CALCIUM 9.2 09/10/2013 0758   ALKPHOS 70 01/10/2017 1101   ALKPHOS 78 09/10/2013 0758   AST 21 01/10/2017 1101   AST 23 09/10/2013 0758   ALT 21 01/10/2017 1101   ALT 28 09/10/2013 0758   BILITOT 0.6 01/10/2017 1101   BILITOT 0.6 09/10/2013 0758        ASSESSMENT & PLAN:  Follicular lymphoma grade ii, intra-abdominal lymph nodes (HCC) # RETROPERITONEAL LN [incidental/asymptomatic]- follicular lymphoma grade 2; likely stage II; Patient tolerated Rituxan weekly 4 Very well. JAN 30th PET 2018- NED. Clinically no evidence of recurrence.   # Bronchitis- improving; I do not suspect this is to rituximab infusions [patient has previous episodes]; check immunoglobulins at next visit  # Large stable benign renal cyst normal kidney function. No recommendations at this time [previously followed by Dr.wolfe]  # Creatinine 1.1- GFR for slightly low; recommend increased fluid intake.   # Follow up in 6 months/labs/ PET scans [if not CT scan]; Labs few days prior.  # 25 minutes face-to-face with the patient discussing the above plan of care; more than 50% of time spent on prognosis/ natural history; counseling and coordination.  Cc: Dr.Tullo       Cammie Sickle, MD 01/10/2017 1:36 PM

## 2017-01-10 NOTE — Assessment & Plan Note (Addendum)
#   RETROPERITONEAL LN [incidental/asymptomatic]- follicular lymphoma grade 2; likely stage II; Patient tolerated Rituxan weekly 4 Very well. JAN 30th PET 2018- NED. Clinically no evidence of recurrence.   # Bronchitis- improving; I do not suspect this is to rituximab infusions [patient has previous episodes]; check immunoglobulins at next visit  # Large stable benign renal cyst normal kidney function. No recommendations at this time [previously followed by Dr.wolfe]  # Creatinine 1.1- GFR for slightly low; recommend increased fluid intake.   # Follow up in 6 months/labs/ PET scans [if not CT scan]; Labs few days prior.  # 25 minutes face-to-face with the patient discussing the above plan of care; more than 50% of time spent on prognosis/ natural history; counseling and coordination.  Cc: Dr.Tullo

## 2017-01-22 ENCOUNTER — Telehealth: Payer: Self-pay | Admitting: *Deleted

## 2017-01-22 DIAGNOSIS — J301 Allergic rhinitis due to pollen: Secondary | ICD-10-CM | POA: Diagnosis not present

## 2017-01-22 NOTE — Telephone Encounter (Signed)
fyi

## 2017-01-22 NOTE — Telephone Encounter (Signed)
Pt has been scheduled with you tomorrow at 11 am.

## 2017-01-22 NOTE — Telephone Encounter (Signed)
Patient was seen on 01/02/2017 and was treated for bronchitis. Patient still having a productive cough with yellow/greenish mucous. Denies fever, chills, nausea, vomiting, shortness of breath, etc. Patient has completed antibiotics and is using her inhaler as prescribed. Patient wondering if she needed another round of antibiotics.

## 2017-01-22 NOTE — Telephone Encounter (Signed)
Call pt  I would prefer her to have an OV , possibly chest xray if still coughing. Bronchitis cough can linger for some time and prescribing repeat antibiotics can cause VERY scary diarrheal infections.   Please have her see me, PCP, or another provider ( [facility])- whichever has earliest appt.

## 2017-01-22 NOTE — Telephone Encounter (Signed)
Pt as seen on 01/02/17. She continues to have the cough and  greenish mucus. Pt finished her round of antibiotics and questioned if she should have another round. Pt contact [phone]

## 2017-01-23 ENCOUNTER — Encounter: Payer: Self-pay | Admitting: Family

## 2017-01-23 ENCOUNTER — Ambulatory Visit (INDEPENDENT_AMBULATORY_CARE_PROVIDER_SITE_OTHER): Payer: Medicare Other | Admitting: Family

## 2017-01-23 VITALS — BP 132/76 | HR 74 | Temp 98.1°F | Ht 63.0 in | Wt 151.8 lb

## 2017-01-23 DIAGNOSIS — J40 Bronchitis, not specified as acute or chronic: Secondary | ICD-10-CM

## 2017-01-23 DIAGNOSIS — J449 Chronic obstructive pulmonary disease, unspecified: Secondary | ICD-10-CM | POA: Insufficient documentation

## 2017-01-23 MED ORDER — BUDESONIDE-FORMOTEROL FUMARATE 80-4.5 MCG/ACT IN AERO: 2.0000 | INHALATION_SPRAY | Freq: Two times a day (BID) | RESPIRATORY_TRACT | 12 refills | Status: DC

## 2017-01-23 MED ORDER — DOXYCYCLINE HYCLATE 100 MG PO TABS: 100.0000 mg | ORAL_TABLET | Freq: Two times a day (BID) | ORAL | 0 refills | Status: DC

## 2017-01-23 NOTE — Assessment & Plan Note (Addendum)
Afebrile. Patient's well-appearing today. SaO2 95%. Etiology of cough, working diagnoses include viral bronchitis,  post viral cough, COPD, bronchiectasis. Her chest x-ray showed no bacterial pneumonia.  Discussed a short trial of Symbicort along with her rescue inhaler at home. She will start back on Mucinex. I advised her especially with the storm, she may start the doxycycline if symptoms are not improved after a couple days with Symbicort, Mucinex. Patient verbalized understanding. Return precautions given. Will appreciate pulmonary consult next week.

## 2017-01-23 NOTE — Patient Instructions (Addendum)
Trial of symbicort for one week to see if symptoms improve.   Start doxycyline if not better on symbicort and mucinex (plain)  Let us know if not better.   Keep pulmonology appt

## 2017-01-23 NOTE — Progress Notes (Signed)
Subjective:    Patient ID: [name], female    DOB: [birth year], 76 y.o.   MRN: [id number]  CC: Saliah A Mcmeans is a 76 y.o. female who presents today for follow up.   HPI: Reevaluation of productive cough x 3 weeks.  Treated with cefdinir 3 weeks ago, some better and then symptoms came back.  Uses inhaler with relief. Endorses sob, wheezing. No CP.   Mucinex helping break up congestion however hasn't taken mucinex.   Appt with pulmonology 9/17    HISTORY:  Past Medical History:  Diagnosis Date  . Basal cell carcinoma of nose   . Bronchitis   . GERD (gastroesophageal reflux disease)   . Heart murmur   . Hemorrhoids   . History of colon polyps   . History of kidney stones   . History of mammogram 2016  . Hyperlipidemia   . Hypertension   . 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 ABDOMINAL HYSTERECTOMY  1984  . BLADDER SUSPENSION  2010  . CHOLECYSTECTOMY  06-01-03  . COLONOSCOPY  2003  . LITHOTRIPSY  2005  . local exicsion skin cancer  2004   squamous cell of chest wall. left chest  . TONSILLECTOMY  1971  . TUBAL LIGATION  1975  . VAGINAL PROLAPSE REPAIR  July 2001   Pelvic Prolapse   Family History  Problem Relation Age of Onset  . Cancer Mother        breast   . Stroke Mother   . Stroke Father   . Cancer Brother        bladder  . Stroke Brother     Allergies: Patient has no known allergies. Current Outpatient Prescriptions on File Prior to Visit  Medication Sig Dispense Refill  . Cholecalciferol (VITAMIN D3) 1000 units CAPS Take 1,000 Units by mouth daily.    Marland Kitchen escitalopram (LEXAPRO) 10 MG tablet TAKE ONE (1) TABLET BY MOUTH EVERY DAY 90 tablet 0  . hyoscyamine (LEVBID) 0.375 MG 12 hr tablet TAKE ONE (1) TABLET BY MOUTH EVERY DAY 90 tablet 1  . losartan (COZAAR) 100 MG tablet TAKE ONE (1) TABLET BY MOUTH EVERY DAY 90 tablet 1  . metoprolol  succinate (TOPROL-XL) 25 MG 24 hr tablet TAKE ONE (1) TABLET BY MOUTH EVERY DAY 90 tablet 3  . potassium chloride SA (K-DUR,KLOR-CON) 20 MEQ tablet TAKE ONE (1) TABLET EACH DAY 90 tablet 0  . Probiotic Product (HEALTHY COLON PO) Take 1 capsule by mouth daily.    . simvastatin (ZOCOR) 40 MG tablet TAKE ONE TABLET EACH EVENING 90 tablet 1  . triamterene-hydrochlorothiazide (MAXZIDE-25) 37.5-25 MG per tablet Take 0.5 tablets by mouth daily. 90 tablet 3  . VENTOLIN HFA 108 (90 Base) MCG/ACT inhaler Inhale 1 puff into the lungs every 6 (six) hours as needed for wheezing or shortness of breath.      No current facility-administered medications on file prior to visit.     Social History  Substance Use Topics  . Smoking status: Never Smoker  . Smokeless tobacco: Never Used  . Alcohol use No    Review of Systems  Constitutional: Negative for chills and fever.  Respiratory: Positive for shortness of breath and wheezing. Negative for cough.   Cardiovascular: Negative for chest pain, palpitations and leg swelling.  Gastrointestinal: Negative for nausea and vomiting.      Objective:    BP  132/76   Pulse 74   Temp 98.1 F (36.7 C) (Oral)   Ht 5\' 3"  (1.6 m)   Wt 151 lb 12.8 oz (68.9 kg)   SpO2 95%   BMI 26.89 kg/m  BP Readings from Last 3 Encounters:  01/23/17 132/76  01/10/17 133/81  01/02/17 120/88   Wt Readings from Last 3 Encounters:  01/23/17 151 lb 12.8 oz (68.9 kg)  01/10/17 150 lb (68 kg)  01/02/17 149 lb 6.4 oz (67.8 kg)    Physical Exam  Constitutional: She appears well-developed and well-nourished.  Eyes: Conjunctivae are normal.  Cardiovascular: Normal rate, regular rhythm, normal heart sounds and normal pulses.   Pulmonary/Chest: Effort normal and breath sounds normal. She has no wheezes. She has no rhonchi. She has no rales.  Neurological: She is alert.  Skin: Skin is warm and dry.  Psychiatric: She has a normal mood and affect. Her speech is normal and behavior is  normal. Thought content normal.  Vitals reviewed.      Assessment & Plan:   Problem List Items Addressed This Visit      Respiratory   Bronchitis - Primary    Afebrile. Patient's well-appearing today. SaO2 95%. Etiology of cough, working diagnoses include viral bronchitis,  post viral cough, COPD, bronchiectasis. Her chest x-ray showed no bacterial pneumonia.  Discussed a short trial of Symbicort along with her rescue inhaler at home. She will start back on Mucinex. I advised her especially with the storm, she may start the doxycycline if symptoms are not improved after a couple days with Symbicort, Mucinex. Patient verbalized understanding. Return precautions given. Will appreciate pulmonary consult next week.       Relevant Medications   doxycycline (VIBRA-TABS) 100 MG tablet   budesonide-formoterol (SYMBICORT) 80-4.5 MCG/ACT inhaler       I have discontinued Ms. Ager's cefdinir. I am also having her start on doxycycline and budesonide-formoterol. Additionally, I am having her maintain her triamterene-hydrochlorothiazide, simvastatin, losartan, hyoscyamine, metoprolol succinate, Vitamin D3, Probiotic Product (HEALTHY COLON PO), potassium chloride SA, escitalopram, and VENTOLIN HFA.   Meds ordered this encounter  Medications  . doxycycline (VIBRA-TABS) 100 MG tablet    Sig: Take 1 tablet (100 mg total) by mouth 2 (two) times daily.    Dispense:  10 tablet    Refill:  0    Order Specific Question:   Supervising Provider    Answer:   Deborra Medina L [[id number]]  . budesonide-formoterol (SYMBICORT) 80-4.5 MCG/ACT inhaler    Sig: Inhale 2 puffs into the lungs 2 (two) times daily.    Dispense:  1 Inhaler    Refill:  12    Order Specific Question:   Supervising Provider    Answer:   [name] [[id number]]    Return precautions given.   Risks, benefits, and alternatives of the medications and treatment plan prescribed today were discussed, and patient expressed understanding.    Education regarding symptom management and diagnosis given to patient on AVS.  Continue to follow with [name], MD for routine health maintenance.   Arieonna A Erich and I agreed with plan.   Mable Paris, FNP

## 2017-01-29 ENCOUNTER — Encounter: Payer: Self-pay | Admitting: Internal Medicine

## 2017-01-29 ENCOUNTER — Ambulatory Visit (INDEPENDENT_AMBULATORY_CARE_PROVIDER_SITE_OTHER): Payer: Medicare Other | Admitting: Internal Medicine

## 2017-01-29 VITALS — BP 144/90 | HR 80 | Resp 16 | Ht 63.0 in | Wt 152.0 lb

## 2017-01-29 DIAGNOSIS — R05 Cough: Secondary | ICD-10-CM

## 2017-01-29 DIAGNOSIS — R059 Cough, unspecified: Secondary | ICD-10-CM

## 2017-01-29 DIAGNOSIS — J301 Allergic rhinitis due to pollen: Secondary | ICD-10-CM | POA: Diagnosis not present

## 2017-01-29 DIAGNOSIS — J449 Chronic obstructive pulmonary disease, unspecified: Secondary | ICD-10-CM | POA: Diagnosis not present

## 2017-01-29 NOTE — Patient Instructions (Signed)
Continue Symbicort as prescribed Albuterol as needed Continue doxycycline as prescribed Avoid secondhand smoke and environmental exposures

## 2017-01-29 NOTE — Progress Notes (Signed)
Name: Whitney Molina MRN: [id number] DOB: [birth year]     CONSULTATION DATE: 01/29/2017  REFERRING MD : Vidal Schwalbe  CHIEF COMPLAINT:  bronchitis  STUDIES:  CXR 01/02/17 I have Independently reviewed images of  CXR   on 01/29/2017 Interpretation: No acute opacifications no effusions hyperinflation of the lungs     HISTORY OF PRESENT ILLNESS:   76 year old pleasant white female seen today for chronic cough Her symptoms started in June 2018 when she had sinus congestion and sinus problems which converted into chest congestion and she had sneezing and coughing and productive cough She also had episode of diarrhea which sounds to be a viral gastroenteritis  In July her cough had persisted she had seen an ENT doctor and was prescribed prednisone therapy which seemed to have helped her cough  In August 2018 patient had a persistent cough she was prescribed Symbicort inhaler therapy and that seems to have helped tremendously however in the next several weeks patient still had a persistent productive cough   Patient is a nonsmoker and is not exposed to secondhand smoke exposure Patient however has several environmental exposure 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 Patient also does a lot of home cooking  Patient was started on doxycycline approximately 2 days ago by her PCP Patient has no acute signs and symptoms of infection at this time Patient does not have any signs symptoms of acute heart failure at this time  Patient seems to be getting better from her chronic cough and intermittent wheezing cough and chest congestion Patient does also have intermittent shortness of breath as well as intermittent exertional dyspnea and exertion   PAST MEDICAL HISTORY :   has a past medical history of Basal cell carcinoma of nose; Bronchitis; GERD (gastroesophageal reflux disease); Heart murmur; Hemorrhoids; History of colon polyps; History of  kidney stones; History of mammogram (2016); Hyperlipidemia; Hypertension; and Primary squamous cell carcinoma of chest wall ([place]) (2004).  has a past surgical history that includes Tonsillectomy (1971); Tubal ligation (1975); Abdominal hysterectomy (1984); Vaginal prolapse repair (July 2001); Cholecystectomy (06-01-03); Lithotripsy (2005); Bladder suspension (2010); local exicsion skin cancer (2004); and Colonoscopy (2003). Prior to Admission medications   Medication Sig Start Date End Date Taking? Authorizing Provider  budesonide-formoterol (SYMBICORT) 80-4.5 MCG/ACT inhaler Inhale 2 puffs into the lungs 2 (two) times daily. 01/23/17   Burnard Hawthorne, FNP  Cholecalciferol (VITAMIN D3) 1000 units CAPS Take 1,000 Units by mouth daily.    [provider]  doxycycline (VIBRA-TABS) 100 MG tablet Take 1 tablet (100 mg total) by mouth 2 (two) times daily. 01/23/17   Burnard Hawthorne, FNP  escitalopram (LEXAPRO) 10 MG tablet TAKE ONE (1) TABLET BY MOUTH EVERY DAY 12/21/16   [name], MD  hyoscyamine (LEVBID) 0.375 MG 12 hr tablet TAKE ONE (1) TABLET BY MOUTH EVERY DAY 08/14/16   [name], MD  losartan (COZAAR) 100 MG tablet TAKE ONE (1) TABLET BY MOUTH EVERY DAY 07/24/16   [name], MD  metoprolol succinate (TOPROL-XL) 25 MG 24 hr tablet TAKE ONE (1) TABLET BY MOUTH EVERY DAY 08/23/16   [name], MD  potassium chloride SA (K-DUR,KLOR-CON) 20 MEQ tablet TAKE ONE (1) TABLET EACH DAY 09/06/16   [name], MD  Probiotic Product (HEALTHY COLON PO) Take 1 capsule by mouth daily.    [provider]  simvastatin (ZOCOR) 40 MG tablet TAKE ONE TABLET EACH EVENING 09/10/15  [name], MD  triamterene-hydrochlorothiazide (MAXZIDE-25) 37.5-25 MG per tablet Take 0.5 tablets by mouth daily. 12/23/14   [name], MD  VENTOLIN HFA 108 (90 Base) MCG/ACT inhaler Inhale 1 puff into the lungs every 6 (six) hours as needed for wheezing or shortness of breath.  12/21/16    [provider]   No Known Allergies  FAMILY HISTORY:  family history includes Cancer in her brother and mother; Stroke in her brother, father, and mother. SOCIAL HISTORY:  reports that she has never smoked. She has never used smokeless tobacco. She reports that she does not drink alcohol or use drugs.  REVIEW OF SYSTEMS:   Constitutional: Negative for fever, chills, weight loss, malaise/fatigue and diaphoresis.  HENT: Negative for hearing loss, ear pain, nosebleeds, congestion, sore throat, neck pain, tinnitus and ear discharge.   Eyes: Negative for blurred vision, double vision, photophobia, pain, discharge and redness.  Respiratory: +cough, hemoptysis, +sputum production, +shortness of breath, +wheezing and stridor.   Cardiovascular: Negative for chest pain, palpitations, orthopnea, claudication, leg swelling and PND.  Gastrointestinal: Negative for heartburn, nausea, vomiting, abdominal pain, diarrhea, constipation, blood in stool and melena.  Genitourinary: Negative for dysuria, urgency, frequency, hematuria and flank pain.  Musculoskeletal: Negative for myalgias, back pain, joint pain and falls.  Skin: Negative for itching and rash.  Neurological: Negative for dizziness, tingling, tremors, sensory change, speech change, focal weakness, seizures, loss of consciousness, weakness and headaches.  Endo/Heme/Allergies: Negative for environmental allergies and polydipsia. Does not bruise/bleed easily.  ALL OTHER ROS ARE NEGATIVE    BP (!) 144/90 (BP Location: Left Arm, Cuff Size: Normal)   Pulse 80   Resp 16   Ht 5\' 3"  (1.6 m)   Wt 152 lb (68.9 kg)   SpO2 94%   BMI 26.93 kg/m    Physical Examination:   GENERAL:NAD, no fevers, chills, no weakness no fatigue HEAD: Normocephalic, atraumatic.  EYES: Pupils equal, round, reactive to light. Extraocular muscles intact. No scleral icterus.  MOUTH: Moist mucosal membrane.   EAR, NOSE, THROAT: Clear without exudates. No  external lesions.  NECK: Supple. No thyromegaly. No nodules. No JVD.  PULMONARY:CTA B/L no wheezes, no crackles, no rhonchi CARDIOVASCULAR: S1 and S2. Regular rate and rhythm. No murmurs, rubs, or gallops. No edema.  GASTROINTESTINAL: Soft, nontender, nondistended. No masses. Positive bowel sounds.  MUSCULOSKELETAL: No swelling, clubbing, or edema. Range of motion full in all extremities.  NEUROLOGIC: Cranial nerves II through XII are intact. No gross focal neurological deficits.  SKIN: No ulceration, lesions, rashes, or cyanosis. Skin warm and dry. Turgor intact.  PSYCHIATRIC: Mood, affect within normal limits. The patient is awake, alert and oriented x 3. Insight, judgment intact.     ASSESSMENT / PLAN: 76 year old pleasant white female seen today for a chronic cough overall impression patient likely to have had an  acute on chronic bronchitis with acute COPD exacerbation with a persistent cough most likely related to a viral infection in the setting of chronic environmental exposure history with office spirometry that has a ratio of 64% predicted with an FEV1 of 84% predicted which is suggestive of mild COPD gold stage a based on her symptoms  #1 chronic cough likely related to underlying COPD  #2 acute bronchitis with mild COPD exacerbation Patient on doxycycline for the next 5 days Patient does not need prednisone therapy at this time  #3 mild COPD gold stage A Recommend continuing Symbicort inhaler as prescribed as this helps tremendously with her cough and  shortness of breath Continue albuterol/Ventolin as needed  #4 Deconditioned state -Recommend increased daily activity and exercise   No further recommendations at this time  Patient/Family are satisfied with Plan of action and management. All questions answered Follow up in 6 months  Amyla Heffner Patricia Pesa, M.D.  [facility]  Medical Director [place] Director [facility]  Cardio-Pulmonary Department

## 2017-02-02 DIAGNOSIS — J301 Allergic rhinitis due to pollen: Secondary | ICD-10-CM | POA: Diagnosis not present

## 2017-02-05 ENCOUNTER — Other Ambulatory Visit: Payer: Self-pay | Admitting: Internal Medicine

## 2017-02-05 DIAGNOSIS — J301 Allergic rhinitis due to pollen: Secondary | ICD-10-CM | POA: Diagnosis not present

## 2017-02-06 DIAGNOSIS — Z85828 Personal history of other malignant neoplasm of skin: Secondary | ICD-10-CM | POA: Diagnosis not present

## 2017-02-06 DIAGNOSIS — Z1283 Encounter for screening for malignant neoplasm of skin: Secondary | ICD-10-CM | POA: Diagnosis not present

## 2017-02-06 DIAGNOSIS — D485 Neoplasm of uncertain behavior of skin: Secondary | ICD-10-CM | POA: Diagnosis not present

## 2017-02-06 DIAGNOSIS — L570 Actinic keratosis: Secondary | ICD-10-CM | POA: Diagnosis not present

## 2017-02-06 DIAGNOSIS — L720 Epidermal cyst: Secondary | ICD-10-CM | POA: Diagnosis not present

## 2017-02-06 DIAGNOSIS — L820 Inflamed seborrheic keratosis: Secondary | ICD-10-CM | POA: Diagnosis not present

## 2017-02-06 DIAGNOSIS — D229 Melanocytic nevi, unspecified: Secondary | ICD-10-CM | POA: Diagnosis not present

## 2017-02-06 DIAGNOSIS — L814 Other melanin hyperpigmentation: Secondary | ICD-10-CM | POA: Diagnosis not present

## 2017-02-06 DIAGNOSIS — D1800 Hemangioma unspecified site: Secondary | ICD-10-CM | POA: Diagnosis not present

## 2017-02-06 DIAGNOSIS — L821 Other seborrheic keratosis: Secondary | ICD-10-CM | POA: Diagnosis not present

## 2017-02-12 DIAGNOSIS — J301 Allergic rhinitis due to pollen: Secondary | ICD-10-CM | POA: Diagnosis not present

## 2017-02-19 ENCOUNTER — Encounter: Payer: Self-pay | Admitting: Internal Medicine

## 2017-02-19 ENCOUNTER — Ambulatory Visit (INDEPENDENT_AMBULATORY_CARE_PROVIDER_SITE_OTHER): Payer: Medicare Other | Admitting: Internal Medicine

## 2017-02-19 VITALS — BP 118/78 | HR 65 | Temp 98.0°F | Resp 15 | Ht 63.0 in | Wt 151.2 lb

## 2017-02-19 DIAGNOSIS — R7303 Prediabetes: Secondary | ICD-10-CM | POA: Diagnosis not present

## 2017-02-19 DIAGNOSIS — E785 Hyperlipidemia, unspecified: Secondary | ICD-10-CM

## 2017-02-19 DIAGNOSIS — E2839 Other primary ovarian failure: Secondary | ICD-10-CM | POA: Diagnosis not present

## 2017-02-19 DIAGNOSIS — Z1239 Encounter for other screening for malignant neoplasm of breast: Secondary | ICD-10-CM

## 2017-02-19 DIAGNOSIS — Z23 Encounter for immunization: Secondary | ICD-10-CM | POA: Diagnosis not present

## 2017-02-19 DIAGNOSIS — E559 Vitamin D deficiency, unspecified: Secondary | ICD-10-CM | POA: Diagnosis not present

## 2017-02-19 DIAGNOSIS — I10 Essential (primary) hypertension: Secondary | ICD-10-CM

## 2017-02-19 DIAGNOSIS — J479 Bronchiectasis, uncomplicated: Secondary | ICD-10-CM

## 2017-02-19 DIAGNOSIS — C8213 Follicular lymphoma grade II, intra-abdominal lymph nodes: Secondary | ICD-10-CM | POA: Diagnosis not present

## 2017-02-19 DIAGNOSIS — Z1231 Encounter for screening mammogram for malignant neoplasm of breast: Secondary | ICD-10-CM

## 2017-02-19 DIAGNOSIS — J449 Chronic obstructive pulmonary disease, unspecified: Secondary | ICD-10-CM | POA: Diagnosis not present

## 2017-02-19 DIAGNOSIS — M810 Age-related osteoporosis without current pathological fracture: Secondary | ICD-10-CM | POA: Diagnosis not present

## 2017-02-19 DIAGNOSIS — J301 Allergic rhinitis due to pollen: Secondary | ICD-10-CM | POA: Diagnosis not present

## 2017-02-19 LAB — LIPID PANEL
CHOL/HDL RATIO: 4
Cholesterol: 171 mg/dL (ref 0–200)
HDL: 40 mg/dL (ref >39.00)
LDL CALC: 108 mg/dL — AB (ref 0–99)
NonHDL: 131.46
Triglycerides: 117 mg/dL (ref 0.0–149.0)
VLDL: 23.4 mg/dL (ref 0.0–40.0)

## 2017-02-19 LAB — VITAMIN D 25 HYDROXY (VIT D DEFICIENCY, FRACTURES): VITD: 42.73 ng/mL (ref 30.00–100.00)

## 2017-02-19 LAB — HEMOGLOBIN A1C: Hgb A1c MFr Bld: 6 % (ref 4.6–6.5)

## 2017-02-19 NOTE — Patient Instructions (Addendum)
Your  fasting glucose has never been  diagnostic of diabetes; but your A1c last year  suggested  that  you are at risk for developing type 2 Diabetes, so we are checking it again today.      Continuing the excellent lifestyle changes that you have made should delay the progression to diabetes for a long time.     I would like to see you again  In 6 months  I have ordered your mammogram

## 2017-02-19 NOTE — Progress Notes (Signed)
Subjective:  Patient ID: Whitney Molina, female    DOB: 09-20-1940  Age: 76 y.o. MRN: [id number]  CC: The primary encounter diagnosis was Breast cancer screening. Diagnoses of Hyperlipidemia LDL goal <100, Prediabetes, Vitamin D deficiency, Encounter for immunization, Follicular lymphoma grade ii, intra-abdominal lymph nodes ([place]), Bronchiectasis , Essential hypertension, COPD with chronic bronchitis ([place]), Age-related osteoporosis without current pathological fracture, and Estrogen deficiency were also pertinent to this visit.  HPI Whitney Molina presents for follow up on prediabetes, hypertension,  GAD with insomnia  Treated in August for bronchitis,  Chest x ray negative,  With cephalosporin and prednisone .referred to  pulmonology  By Whitney Molina for persistent symptoms.   had PFTS that diagnosed COPD.  Now Prescribed symbicort 2puffs  bid  By Dr. Mortimer Molina,  Has been using for  the last 2 weeks.  Rinsing mouth.  The cough has improved (nighttime cough has resolved) but continues to report excessive fatigue  due to not sleeping well.    Still clearing the throat . Denies PND.  Using nasocort .  Rinsing mouth   Prediabetes:  Fasting today .   Diet has changed due to husband's needs post diagnosis of interstitial cystitis.  Diet reviewed.  Eats bananas , on cereal .  potato bread.    Follicular lymphoma:   Finished 4 cycles of Rituxan Feb 4818 for folicular  lymphoma diagnosed in 2016.  Ongoing surveillance  [facility]      Lab Results  Component Value Date   HGBA1C 6.0 02/19/2017     Left hand CTS improving feeling  better  Exercising in the house and walking when the weather is bad  Has a blocked tear duct  Again  Seeing  Annual eye exam at Whitney Molina Prior to Visit  Medication Sig Dispense Refill  . budesonide-formoterol (SYMBICORT) 80-4.5 MCG/ACT inhaler Inhale 2 puffs into the lungs 2 (two)  times daily. 1 Inhaler 12  . Cholecalciferol (VITAMIN D3) 1000 units CAPS Take 1,000 Units by mouth daily.    Marland Kitchen escitalopram (LEXAPRO) 10 MG tablet TAKE ONE (1) TABLET BY MOUTH EVERY DAY 90 tablet 0  . hyoscyamine (LEVBID) 0.375 MG 12 hr tablet TAKE ONE (1) TABLET BY MOUTH EVERY DAY 90 tablet 1  . losartan (COZAAR) 100 MG tablet TAKE ONE TABLET BY MOUTH EVERY DAY 90 tablet 1  . metoprolol succinate (TOPROL-XL) 25 MG 24 hr tablet TAKE ONE (1) TABLET BY MOUTH EVERY DAY 90 tablet 3  . potassium chloride SA (K-DUR,KLOR-CON) 20 MEQ tablet TAKE ONE (1) TABLET EACH DAY 90 tablet 0  . Probiotic Product (HEALTHY COLON PO) Take 1 capsule by mouth daily.    . simvastatin (ZOCOR) 40 MG tablet TAKE ONE TABLET EACH EVENING 90 tablet 1  . triamterene-hydrochlorothiazide (MAXZIDE-25) 37.5-25 MG per tablet Take 0.5 tablets by mouth daily. 90 tablet 3  . VENTOLIN HFA 108 (90 Base) MCG/ACT inhaler Inhale 1 puff into the lungs every 6 (six) hours as needed for wheezing or shortness of breath.     . doxycycline (VIBRA-TABS) 100 MG tablet Take 1 tablet (100 mg total) by mouth 2 (two) times daily. (Patient not taking: Reported on 02/19/2017) 10 tablet 0   No facility-administered Molina prior to visit.     Review of Systems;  Patient denies headache, fevers, malaise, unintentional weight loss, skin rash, eye pain, sinus congestion and sinus pain, sore throat, dysphagia,  hemoptysis , cough, dyspnea, wheezing, chest pain, palpitations, orthopnea, edema, abdominal pain, nausea, melena, diarrhea, constipation, flank pain, dysuria, hematuria, urinary  Frequency, nocturia, numbness, tingling, seizures,  Focal weakness, Loss of consciousness,  Tremor, insomnia, depression, anxiety, and suicidal ideation.      Objective:  BP 118/78 (BP Location: Left Arm, Patient Position: Sitting, Cuff Size: Normal)   Pulse 65   Temp 98 F (36.7 C) (Oral)   Resp 15   Ht 5\' 3"  (1.6 m)   Wt 151 lb 3.2 oz (68.6 kg)   SpO2 96%    BMI 26.78 kg/m   BP Readings from Last 3 Encounters:  02/19/17 118/78  01/29/17 (!) 144/90  01/23/17 132/76    Wt Readings from Last 3 Encounters:  02/19/17 151 lb 3.2 oz (68.6 kg)  01/29/17 152 lb (68.9 kg)  01/23/17 151 lb 12.8 oz (68.9 kg)    General appearance: alert, cooperative and appears stated age Ears: normal TM's and external ear canals both ears Throat: lips, mucosa, and tongue normal; teeth and gums normal Neck: no adenopathy, no carotid bruit, supple, symmetrical, trachea midline and thyroid not enlarged, symmetric, no tenderness/mass/nodules Back: symmetric, no curvature. ROM normal. No CVA tenderness. Lungs: clear to auscultation bilaterally Heart: regular rate and rhythm, S1, S2 normal, no murmur, click, rub or gallop Abdomen: soft, non-tender; bowel sounds normal; no masses,  no organomegaly Pulses: 2+ and symmetric Skin: Skin color, texture, turgor normal. No rashes or lesions Lymph nodes: Cervical, supraclavicular, and axillary nodes normal.  Lab Results  Component Value Date   HGBA1C 6.0 02/19/2017   HGBA1C 5.9 01/10/2016   HGBA1C 5.9 09/03/2015    Lab Results  Component Value Date   CREATININE 1.15 (H) 01/10/2017   CREATININE 0.81 06/21/2016   CREATININE 1.03 (H) 02/04/2016    Lab Results  Component Value Date   WBC 6.9 01/10/2017   HGB 12.9 01/10/2017   HCT 37.1 01/10/2017   PLT 237 01/10/2017   GLUCOSE 90 01/10/2017   CHOL 171 02/19/2017   TRIG 117.0 02/19/2017   HDL 40.00 02/19/2017   LDLDIRECT 103 01/10/2016   LDLCALC 108 (H) 02/19/2017   ALT 21 01/10/2017   AST 21 01/10/2017   NA 136 01/10/2017   K 3.8 01/10/2017   CL 99 (L) 01/10/2017   CREATININE 1.15 (H) 01/10/2017   BUN 15 01/10/2017   CO2 30 01/10/2017   TSH 1.29 09/03/2015   INR 1.02 03/24/2015   HGBA1C 6.0 02/19/2017    Nm Pet Image Restag (ps) Skull Base To Thigh  Result Date: 06/13/2016 CLINICAL DATA:  Subsequent treatment strategy for follicular lymphoma. EXAM:  NUCLEAR MEDICINE PET SKULL BASE TO THIGH TECHNIQUE: 12.4 mCi F-18 FDG was injected intravenously. Full-ring PET imaging was performed from the skull base to thigh after the radiotracer. CT data was obtained and used for attenuation correction and anatomic localization. FASTING BLOOD GLUCOSE:  Value: 87 mg/dl COMPARISON:  PET-CT 09/22/2015 FINDINGS: NECK No hypermetabolic lymph nodes in the neck. CHEST No hypermetabolic mediastinal or hilar nodes. No suspicious pulmonary nodules on the CT scan. ABDOMEN/PELVIS No abnormal hypermetabolic activity within the liver, pancreas, adrenal glands, or spleen. No hypermetabolic lymph nodes in the abdomen or pelvis. Large benign LEFT renal cysts again noted. Atherosclerotic calcification of the aorta. Sigmoid diverticulosis. SKELETON No focal hypermetabolic activity to suggest skeletal metastasis. IMPRESSION: No evidence of active lymphoma on FDG PET scan. Electronically Signed   By: Suzy Bouchard M.D.   On: 06/13/2016 15:01    Assessment &  Plan:   Problem List Items Addressed This Visit    Hyperlipidemia LDL goal <130    LDL and triglycerides are at goal on current Molina. He has no side effects and liver enzymes are normal. No changes today  Lab Results  Component Value Date   CHOL 171 02/19/2017   HDL 40.00 02/19/2017   LDLCALC 108 (H) 02/19/2017   LDLDIRECT 103 01/10/2016   TRIG 117.0 02/19/2017   CHOLHDL 4 02/19/2017   Lab Results  Component Value Date   ALT 21 01/10/2017   AST 21 01/10/2017   ALKPHOS 70 01/10/2017   BILITOT 0.6 01/10/2017         Bronchiectasis     She has resumed twice daily use of symbicort after seeing Dr Whitney Molina with PFTS diagnostic of COPD       COPD with chronic bronchitis ([place])    And bronchiectasis by prior pulmonary evaluation in 2015 [facility])  Continue symbicort bid. And prn albuterol.       Essential hypertension    Well controlled on current regimen. Renal function stable, no changes today.  Lab  Results  Component Value Date   CREATININE 1.15 (H) 01/10/2017   Lab Results  Component Value Date   NA 136 01/10/2017   K 3.8 01/10/2017   CL 99 (L) 01/10/2017   CO2 30 27/10/2374         Follicular lymphoma grade ii, intra-abdominal lymph nodes ([employer])    In remission after finishing Rituxan Feb 2017      Osteoporosis    T scores -2.4 Sept 2015.  No history of fractures.  No historyof prior treatment. She has deferred use of medication due to other health conditions.  Repeat DEXA now that lymphoma is in remission       Prediabetes    Her  random glucose is not  elevated but her A1c suggests she is at risk for developing diabetes.  I recommend she follow a low glycemic index diet and particpate regularly in an aerobic  exercise activity.  We should check an A1c in 6 months.   Lab Results  Component Value Date   HGBA1C 6.0 02/19/2017         Relevant Orders   Hemoglobin A1c (Completed)    Other Visit Diagnoses    Breast cancer screening    -  Primary   Relevant Orders   MM SCREENING BREAST TOMO BILATERAL   Vitamin D deficiency       Relevant Orders   VITAMIN D 25 Hydroxy (Vit-D Deficiency, Fractures) (Completed)   Encounter for immunization       Relevant Orders   Flu vaccine HIGH DOSE PF (Completed)   Estrogen deficiency       Relevant Orders   DG Bone Density     A total of 40 minutes was spent with patient more than half of which was spent in counseling patient on the above mentioned issues , reviewing and explaining recent labs and imaging studies done, and coordination of care.  I have discontinued Ms. Bushway's doxycycline. I am also having her maintain her triamterene-hydrochlorothiazide, simvastatin, hyoscyamine, metoprolol succinate, Vitamin D3, Probiotic Product (HEALTHY COLON PO), potassium chloride SA, escitalopram, VENTOLIN HFA, budesonide-formoterol, and losartan.  No orders of the defined types were placed in this encounter.   Molina  Discontinued During This Encounter  Medication Reason  . doxycycline (VIBRA-TABS) 100 MG tablet Patient has not taken in last 30 days    Follow-up: Return in about 6  months (around 08/20/2017).   [name], MD

## 2017-02-20 NOTE — Assessment & Plan Note (Signed)
In remission after finishing Rituxan Feb 2017

## 2017-02-20 NOTE — Assessment & Plan Note (Signed)
And bronchiectasis by prior pulmonary evaluation in 2015 [facility])  Continue symbicort bid. And prn albuterol.

## 2017-02-20 NOTE — Assessment & Plan Note (Signed)
Her  random glucose is not  elevated but her A1c suggests she is at risk for developing diabetes.  I recommend she follow a low glycemic index diet and particpate regularly in an aerobic  exercise activity.  We should check an A1c in 6 months.   Lab Results  Component Value Date   HGBA1C 6.0 02/19/2017

## 2017-02-20 NOTE — Assessment & Plan Note (Signed)
LDL and triglycerides are at goal on current medications. He has no side effects and liver enzymes are normal. No changes today  Lab Results  Component Value Date   CHOL 171 02/19/2017   HDL 40.00 02/19/2017   LDLCALC 108 (H) 02/19/2017   LDLDIRECT 103 01/10/2016   TRIG 117.0 02/19/2017   CHOLHDL 4 02/19/2017   Lab Results  Component Value Date   ALT 21 01/10/2017   AST 21 01/10/2017   ALKPHOS 70 01/10/2017   BILITOT 0.6 01/10/2017

## 2017-02-20 NOTE — Assessment & Plan Note (Signed)
Well controlled on current regimen. Renal function stable, no changes today.  Lab Results  Component Value Date   CREATININE 1.15 (H) 01/10/2017   Lab Results  Component Value Date   NA 136 01/10/2017   K 3.8 01/10/2017   CL 99 (L) 01/10/2017   CO2 30 01/10/2017

## 2017-02-20 NOTE — Assessment & Plan Note (Signed)
T scores -2.4 Sept 2015.  No history of fractures.  No historyof prior treatment. She has deferred use of medication due to other health conditions.  Repeat DEXA now that lymphoma is in remission

## 2017-02-20 NOTE — Assessment & Plan Note (Signed)
She has resumed twice daily use of symbicort after seeing Dr Mortimer Fries with PFTS diagnostic of COPD

## 2017-02-26 ENCOUNTER — Other Ambulatory Visit: Payer: Self-pay | Admitting: Internal Medicine

## 2017-02-26 DIAGNOSIS — J301 Allergic rhinitis due to pollen: Secondary | ICD-10-CM | POA: Diagnosis not present

## 2017-03-05 ENCOUNTER — Other Ambulatory Visit: Payer: Self-pay | Admitting: Internal Medicine

## 2017-03-05 DIAGNOSIS — J301 Allergic rhinitis due to pollen: Secondary | ICD-10-CM | POA: Diagnosis not present

## 2017-03-12 DIAGNOSIS — J301 Allergic rhinitis due to pollen: Secondary | ICD-10-CM | POA: Diagnosis not present

## 2017-03-20 ENCOUNTER — Other Ambulatory Visit: Payer: Self-pay | Admitting: Internal Medicine

## 2017-03-22 DIAGNOSIS — J301 Allergic rhinitis due to pollen: Secondary | ICD-10-CM | POA: Diagnosis not present

## 2017-03-26 ENCOUNTER — Ambulatory Visit
Admission: RE | Admit: 2017-03-26 | Discharge: 2017-03-26 | Disposition: A | Discharge status: Home / Self Care | Payer: Medicare Other | Source: Ambulatory Visit | Attending: Internal Medicine | Admitting: Internal Medicine

## 2017-03-26 DIAGNOSIS — M85851 Other specified disorders of bone density and structure, right thigh: Secondary | ICD-10-CM | POA: Diagnosis not present

## 2017-03-26 DIAGNOSIS — Z1231 Encounter for screening mammogram for malignant neoplasm of breast: Secondary | ICD-10-CM | POA: Insufficient documentation

## 2017-03-26 DIAGNOSIS — E2839 Other primary ovarian failure: Secondary | ICD-10-CM | POA: Insufficient documentation

## 2017-03-26 DIAGNOSIS — J449 Chronic obstructive pulmonary disease, unspecified: Secondary | ICD-10-CM | POA: Diagnosis not present

## 2017-03-26 DIAGNOSIS — Z1239 Encounter for other screening for malignant neoplasm of breast: Secondary | ICD-10-CM

## 2017-04-02 DIAGNOSIS — J301 Allergic rhinitis due to pollen: Secondary | ICD-10-CM | POA: Diagnosis not present

## 2017-04-09 ENCOUNTER — Other Ambulatory Visit: Payer: Self-pay | Admitting: Internal Medicine

## 2017-04-09 DIAGNOSIS — J301 Allergic rhinitis due to pollen: Secondary | ICD-10-CM | POA: Diagnosis not present

## 2017-04-10 NOTE — Telephone Encounter (Signed)
Refilled: 12/23/2014 Last OV: 02/19/2017 Next OV: not scheduled

## 2017-04-12 NOTE — Telephone Encounter (Signed)
Has not taken in years.  needs to know why she is resuming it   Lab Results  Component Value Date   CREATININE 1.15 (H) 01/10/2017   Lab Results  Component Value Date   NA 136 01/10/2017   K 3.8 01/10/2017   CL 99 (L) 01/10/2017   CO2 30 01/10/2017

## 2017-04-13 ENCOUNTER — Telehealth: Payer: Self-pay | Admitting: Internal Medicine

## 2017-04-13 NOTE — Telephone Encounter (Signed)
Copied from [place]. Topic: Quick Communication - See Telephone Encounter >> Apr 13, 2017  1:12 PM Corie Chiquito, [place] wrote: CRM for notification. See Telephone encounter for: Patient called because she needs a refill on her Triamterene-Hydrochlorothinazide. If someone could give her a call back about this matter  04/13/17.

## 2017-04-13 NOTE — Telephone Encounter (Signed)
Telephone call to patient.  No answer and no vm to leave a message.  Mobile number would not allow a message to be left, either.

## 2017-04-16 ENCOUNTER — Other Ambulatory Visit: Payer: Self-pay | Admitting: *Deleted

## 2017-04-16 DIAGNOSIS — J301 Allergic rhinitis due to pollen: Secondary | ICD-10-CM | POA: Diagnosis not present

## 2017-04-16 MED ORDER — TRIAMTERENE-HCTZ 37.5-25 MG PO TABS: 0.5000 | ORAL_TABLET | Freq: Every day | ORAL | 3 refills | Status: DC

## 2017-04-17 ENCOUNTER — Telehealth: Payer: Self-pay | Admitting: Internal Medicine

## 2017-04-17 MED ORDER — TRIAMTERENE-HCTZ 37.5-25 MG PO TABS: 0.5000 | ORAL_TABLET | Freq: Every day | ORAL | 3 refills | Status: DC

## 2017-04-17 NOTE — Telephone Encounter (Signed)
Pt. Called and reported the pharmacy did not get the refill authorization on the Triamterene-hydrochlorothiazide.  Noted the refill on 12/3 did not get sent through to pharmacy, as it showed "no print".  Advised will resend the Rx.  Agreed.

## 2017-04-17 NOTE — Telephone Encounter (Signed)
Copied from [place]. Topic: Quick Communication - See Telephone Encounter >> Apr 16, 2017  9:29 AM Burnis Medin, [place] wrote: CRM for notification. See Telephone encounter for: Pt. Called about getting a refill on losartan (COZAAR) 100 MG tablet. Pt uses [facility] on [address] in [place].      / Called patient and advised that the Cozaar should have a refill at the pharmacy  Disp Refills Start End   losartan (COZAAR) 100 MG tablet 90 tablet 1 02/06/2017    Sig: TAKE ONE TABLET BY MOUTH EVERY DAY   Sent to pharmacy as: losartan (COZAAR) 100 MG tablet   E-Prescribing Status: Receipt confirmed by pharmacy (02/06/2017 9:42 AM EDT)    / And the Maxzide is ready triamterene-hydrochlorothiazide (MAXZIDE-25) 37.5-25 MG tablet 90 tablet 3 04/16/2017    Sig - Route: Take 0.5 tablets by mouth daily. - Oral   Class: No Print

## 2017-04-19 NOTE — Telephone Encounter (Signed)
Rx refilled 12/4

## 2017-04-19 NOTE — Telephone Encounter (Signed)
Medication was refilled on 04/17/2017

## 2017-04-27 DIAGNOSIS — J301 Allergic rhinitis due to pollen: Secondary | ICD-10-CM | POA: Diagnosis not present

## 2017-04-30 DIAGNOSIS — J301 Allergic rhinitis due to pollen: Secondary | ICD-10-CM | POA: Diagnosis not present

## 2017-05-02 DIAGNOSIS — H2513 Age-related nuclear cataract, bilateral: Secondary | ICD-10-CM | POA: Diagnosis not present

## 2017-05-07 DIAGNOSIS — J301 Allergic rhinitis due to pollen: Secondary | ICD-10-CM | POA: Diagnosis not present

## 2017-05-14 DIAGNOSIS — J301 Allergic rhinitis due to pollen: Secondary | ICD-10-CM | POA: Diagnosis not present

## 2017-05-18 ENCOUNTER — Encounter: Payer: Self-pay | Admitting: Internal Medicine

## 2017-05-18 ENCOUNTER — Ambulatory Visit (INDEPENDENT_AMBULATORY_CARE_PROVIDER_SITE_OTHER): Payer: Medicare Other | Admitting: Internal Medicine

## 2017-05-18 VITALS — BP 148/82 | HR 98 | Temp 98.9°F | Wt 153.0 lb

## 2017-05-18 DIAGNOSIS — J069 Acute upper respiratory infection, unspecified: Secondary | ICD-10-CM

## 2017-05-18 DIAGNOSIS — J449 Chronic obstructive pulmonary disease, unspecified: Secondary | ICD-10-CM | POA: Diagnosis not present

## 2017-05-18 MED ORDER — HYDROCOD POLST-CPM POLST ER 10-8 MG/5ML PO SUER: 5.0000 mL | Freq: Every evening | ORAL | 0 refills | Status: DC | PRN

## 2017-05-18 MED ORDER — AZITHROMYCIN 250 MG PO TABS: ORAL_TABLET | ORAL | 0 refills | Status: DC

## 2017-05-18 NOTE — Patient Instructions (Signed)
Upper Respiratory Infection, Adult Most upper respiratory infections (URIs) are caused by a virus. A URI affects the nose, throat, and upper air passages. The most common type of URI is often called "the common cold." Follow these instructions at home:  Take medicines only as told by your doctor.  Gargle warm saltwater or take cough drops to comfort your throat as told by your doctor.  Use a warm mist humidifier or inhale steam from a shower to increase air moisture. This may make it easier to breathe.  Drink enough fluid to keep your pee (urine) clear or pale yellow.  Eat soups and other clear broths.  Have a healthy diet.  Rest as needed.  Go back to work when your fever is gone or your doctor says it is okay. ? You may need to stay home longer to avoid giving your URI to others. ? You can also wear a face mask and wash your hands often to prevent spread of the virus.  Use your inhaler more if you have asthma.  Do not use any tobacco products, including cigarettes, chewing tobacco, or electronic cigarettes. If you need help quitting, ask your doctor. Contact a doctor if:  You are getting worse, not better.  Your symptoms are not helped by medicine.  You have chills.  You are getting more short of breath.  You have brown or red mucus.  You have yellow or brown discharge from your nose.  You have pain in your face, especially when you bend forward.  You have a fever.  You have puffy (swollen) neck glands.  You have pain while swallowing.  You have white areas in the back of your throat. Get help right away if:  You have very bad or constant: ? Headache. ? Ear pain. ? Pain in your forehead, behind your eyes, and over your cheekbones (sinus pain). ? Chest pain.  You have long-lasting (chronic) lung disease and any of the following: ? Wheezing. ? Long-lasting cough. ? Coughing up blood. ? A change in your usual mucus.  You have a stiff neck.  You have  changes in your: ? Vision. ? Hearing. ? Thinking. ? Mood. This information is not intended to replace advice given to you by your health care provider. Make sure you discuss any questions you have with your health care provider. Document Released: 10/18/2007 Document Revised: 01/02/2016 Document Reviewed: 08/06/2013 Elsevier Interactive Patient Education  2018 Elsevier Inc.  

## 2017-05-18 NOTE — Progress Notes (Signed)
HPI  Pt presents to the clinic today with c/o runny nose, nasal congestion, sore throat and cough. This started 5 days ago. She is blowing green mucous out of her nose. She denies difficulty swallowing. The cough is productive of green mucous. She denies fever, chills or body aches. She has chronic SOB but feels like it has been worse lately. She has tried Mucinex with minimal relief. She has a history of COPD. She has not had sick contacts that she is aware of.  Review of Systems      Past Medical History:  Diagnosis Date  . Basal cell carcinoma of nose   . Bronchitis   . GERD (gastroesophageal reflux disease)   . Heart murmur   . Hemorrhoids   . History of colon polyps   . History of kidney stones   . History of mammogram 2016  . Hyperlipidemia   . Hypertension   . 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 Stroke Mother   . Breast cancer Mother 14  . Stroke Father   . Cancer Brother        bladder  . Stroke Brother   . Breast cancer Paternal Aunt 46    Social History   Socioeconomic History  . Marital status: Married    Spouse name: Not on file  . Number of children: Not on file  . Years of education: Not on file  . Highest education level: Not on file  Social Needs  . Financial resource strain: Not on file  . Food insecurity - worry: Not on file  . Food insecurity - inability: Not on file  . Transportation needs - medical: Not on file  . Transportation needs - non-medical: Not on file  Occupational History  . Not on file  Tobacco Use  . Smoking status: Never Smoker  . Smokeless tobacco: Never Used  Substance and Sexual Activity  . Alcohol use: No  . Drug use: No  . Sexual activity: Yes  Other Topics Concern  . Not on file  Social History Narrative  . Not on file    No Known Allergies   Constitutional: Denies headache, fatigue, fever or  abrupt weight changes.   HEENT:  Positive runny nose, nasal congestion, sore throat. Denies eye redness, eye pain, pressure behind the eyes, facial pain, ear pain, ringing in the ears, wax buildup, or bloody nose. Respiratory: Positive cough. Denies difficulty breathing or shortness of breath.  Cardiovascular: Denies chest pain, chest tightness, palpitations or swelling in the hands or feet.   No other specific complaints in a complete review of systems (except as listed in HPI above).  Objective:   BP (!) 148/82   Pulse 98   Temp 98.9 F (37.2 C) (Oral)   Wt 153 lb (69.4 kg)   SpO2 95%   BMI 27.10 kg/m  Wt Readings from Last 3 Encounters:  05/18/17 153 lb (69.4 kg)  02/19/17 151 lb 3.2 oz (68.6 kg)  01/29/17 152 lb (68.9 kg)     General: Appears her stated age, in NAD. HEENT: Head: normal shape and size, no sinus tenderneess;  Ears: Tm's gray and intact, normal light reflex; Nose: mucosa pink and moist, septum midline; Throat/Mouth: + PND. Teeth present, mucosa erythematous and moist, no exudate noted, no lesions or ulcerations noted.  Neck: No cervical lymphadenopathy.  Pulmonary/Chest: Normal effort and positive vesicular breath  sounds with intermittent expiratory wheezing noted. No respiratory distress. No rales or ronchi noted.       Assessment & Plan:   Upper Respiratory Infection:  Get some rest and drink plenty of water Do salt water gargles for the sore throat Start Zyrtec OTC eRx for Azithromax x 5 days eRx for Tussionex cough syrup  RTC as needed or if symptoms persist.   Webb Silversmith, NP

## 2017-05-21 DIAGNOSIS — J301 Allergic rhinitis due to pollen: Secondary | ICD-10-CM | POA: Diagnosis not present

## 2017-05-28 DIAGNOSIS — J301 Allergic rhinitis due to pollen: Secondary | ICD-10-CM | POA: Diagnosis not present

## 2017-06-04 DIAGNOSIS — J301 Allergic rhinitis due to pollen: Secondary | ICD-10-CM | POA: Diagnosis not present

## 2017-06-08 ENCOUNTER — Other Ambulatory Visit: Payer: Self-pay | Admitting: Internal Medicine

## 2017-06-11 DIAGNOSIS — J301 Allergic rhinitis due to pollen: Secondary | ICD-10-CM | POA: Diagnosis not present

## 2017-06-18 DIAGNOSIS — J301 Allergic rhinitis due to pollen: Secondary | ICD-10-CM | POA: Diagnosis not present

## 2017-06-23 ENCOUNTER — Other Ambulatory Visit: Payer: Self-pay | Admitting: Internal Medicine

## 2017-06-25 ENCOUNTER — Ambulatory Visit (INDEPENDENT_AMBULATORY_CARE_PROVIDER_SITE_OTHER): Payer: Medicare Other | Admitting: Internal Medicine

## 2017-06-25 ENCOUNTER — Encounter: Payer: Self-pay | Admitting: Internal Medicine

## 2017-06-25 VITALS — BP 126/70 | HR 65 | Temp 99.0°F | Resp 16 | Wt 151.5 lb

## 2017-06-25 DIAGNOSIS — J069 Acute upper respiratory infection, unspecified: Secondary | ICD-10-CM | POA: Diagnosis not present

## 2017-06-25 DIAGNOSIS — B9789 Other viral agents as the cause of diseases classified elsewhere: Secondary | ICD-10-CM | POA: Diagnosis not present

## 2017-06-25 MED ORDER — HYDROCOD POLST-CPM POLST ER 10-8 MG/5ML PO SUER: 5.0000 mL | Freq: Every evening | ORAL | 0 refills | Status: DC | PRN

## 2017-06-25 NOTE — Patient Instructions (Signed)
Upper Respiratory Infection, Adult Most upper respiratory infections (URIs) are caused by a virus. A URI affects the nose, throat, and upper air passages. The most common type of URI is often called "the common cold." Follow these instructions at home:  Take medicines only as told by your doctor.  Gargle warm saltwater or take cough drops to comfort your throat as told by your doctor.  Use a warm mist humidifier or inhale steam from a shower to increase air moisture. This may make it easier to breathe.  Drink enough fluid to keep your pee (urine) clear or pale yellow.  Eat soups and other clear broths.  Have a healthy diet.  Rest as needed.  Go back to work when your fever is gone or your doctor says it is okay. ? You may need to stay home longer to avoid giving your URI to others. ? You can also wear a face mask and wash your hands often to prevent spread of the virus.  Use your inhaler more if you have asthma.  Do not use any tobacco products, including cigarettes, chewing tobacco, or electronic cigarettes. If you need help quitting, ask your doctor. Contact a doctor if:  You are getting worse, not better.  Your symptoms are not helped by medicine.  You have chills.  You are getting more short of breath.  You have brown or red mucus.  You have yellow or brown discharge from your nose.  You have pain in your face, especially when you bend forward.  You have a fever.  You have puffy (swollen) neck glands.  You have pain while swallowing.  You have white areas in the back of your throat. Get help right away if:  You have very bad or constant: ? Headache. ? Ear pain. ? Pain in your forehead, behind your eyes, and over your cheekbones (sinus pain). ? Chest pain.  You have long-lasting (chronic) lung disease and any of the following: ? Wheezing. ? Long-lasting cough. ? Coughing up blood. ? A change in your usual mucus.  You have a stiff neck.  You have  changes in your: ? Vision. ? Hearing. ? Thinking. ? Mood. This information is not intended to replace advice given to you by your health care provider. Make sure you discuss any questions you have with your health care provider. Document Released: 10/18/2007 Document Revised: 01/02/2016 Document Reviewed: 08/06/2013 Elsevier Interactive Patient Education  2018 Elsevier Inc.  

## 2017-06-25 NOTE — Progress Notes (Signed)
Subjective:    Patient ID: Whitney Molina, female    DOB: 1940/06/26, 77 y.o.   MRN: [id number]  HPI  Pt presents to the clinic today with c/o nasal congestion, sore throat and cough. This started 3 days ago. She is blowing clear mucous out of her nose. She denies difficulty swallowing. The cough is productive of green mucous. She reports she has run a low grade fever, but denies chills or body aches. She has tried Mucinex with some relief. She has a history of bronchiectasis/COPD. She has had sick contacts.  Review of Systems   Past Medical History:  Diagnosis Date  . Basal cell carcinoma of nose   . Bronchitis   . GERD (gastroesophageal reflux disease)   . Heart murmur   . Hemorrhoids   . History of colon polyps   . History of kidney stones   . History of mammogram 2016  . Hyperlipidemia   . Hypertension   . Primary squamous cell carcinoma of chest wall ([place]) 2004   removed at [facility]    Current Outpatient Medications  Medication Sig Dispense Refill  . budesonide-formoterol (SYMBICORT) 80-4.5 MCG/ACT inhaler Inhale 2 puffs into the lungs 2 (two) times daily. 1 Inhaler 12  . Cholecalciferol (VITAMIN D3) 1000 units CAPS Take 1,000 Units by mouth daily.    Marland Kitchen escitalopram (LEXAPRO) 10 MG tablet TAKE ONE TABLET BY MOUTH EVERY DAY. 90 tablet 1  . hyoscyamine (LEVBID) 0.375 MG 12 hr tablet TAKE 1 TABLET BY MOUTH ONCE DAILY 90 tablet 1  . losartan (COZAAR) 100 MG tablet TAKE ONE TABLET BY MOUTH EVERY DAY 90 tablet 1  . metoprolol succinate (TOPROL-XL) 25 MG 24 hr tablet TAKE ONE (1) TABLET BY MOUTH EVERY DAY 90 tablet 3  . potassium chloride SA (K-DUR,KLOR-CON) 20 MEQ tablet TAKE 1 TABLET BY MOUTH ONCE DAILY 90 tablet 0  . Probiotic Product (HEALTHY COLON PO) Take 1 capsule by mouth daily.    . simvastatin (ZOCOR) 40 MG tablet TAKE 1 TABLET EACH EVENING 90 tablet 1  . triamterene-hydrochlorothiazide (MAXZIDE-25) 37.5-25 MG tablet Take 0.5 tablets by mouth daily. 90 tablet 3  .  VENTOLIN HFA 108 (90 Base) MCG/ACT inhaler Inhale 1 puff into the lungs every 6 (six) hours as needed for wheezing or shortness of breath.     Marland Kitchen azithromycin (ZITHROMAX) 250 MG tablet Take 2 tabs today, then 1 tab daily x 4 days (Patient not taking: Reported on 06/25/2017) 6 tablet 0  . chlorpheniramine-HYDROcodone (TUSSIONEX PENNKINETIC ER) 10-8 MG/5ML SUER Take 5 mLs by mouth at bedtime as needed. 140 mL 0   No current facility-administered medications for this visit.     No Known Allergies  Family History  Problem Relation Age of Onset  . Stroke Mother   . Breast cancer Mother 78  . Stroke Father   . Cancer Brother        bladder  . Stroke Brother   . Breast cancer Paternal Aunt 27    Social History   Socioeconomic History  . Marital status: Married    Spouse name: Not on file  . Number of children: Not on file  . Years of education: Not on file  . Highest education level: Not on file  Social Needs  . Financial resource strain: Not on file  . Food insecurity - worry: Not on file  . Food insecurity - inability: Not on file  . Transportation needs - medical: Not on file  . Transportation needs -  non-medical: Not on file  Occupational History  . Not on file  Tobacco Use  . Smoking status: Never Smoker  . Smokeless tobacco: Never Used  Substance and Sexual Activity  . Alcohol use: No  . Drug use: No  . Sexual activity: Yes  Other Topics Concern  . Not on file  Social History Narrative  . Not on file     Constitutional: Pt reports fever. Denies malaise, fatigue, headache or abrupt weight changes.  HEENT: Pt reports nasal congestion, sore throat. Denies eye pain, eye redness, ear pain, ringing in the ears, wax buildup, runny nose, bloody noset. Respiratory: Pt reports cough. Denies difficulty breathing, shortness of breath.    No other specific complaints in a complete review of systems (except as listed in HPI above).   Objective:   Physical Exam  BP 126/70  (BP Location: Right Arm, Patient Position: Sitting, Cuff Size: Normal)   Pulse 65   Temp 99 F (37.2 C) (Oral)   Resp 16   Wt 151 lb 8 oz (68.7 kg)   SpO2 98%   BMI 26.84 kg/m  Wt Readings from Last 3 Encounters:  06/25/17 151 lb 8 oz (68.7 kg)  05/18/17 153 lb (69.4 kg)  02/19/17 151 lb 3.2 oz (68.6 kg)    General: Appears her stated age,  in NAD. HEENT: Head: normal shape and size, no sinus tenderness noted;  Ears: Tm's gray and intact, normal light reflex; Nose: mucosa pink and moist, septum midline; Throat/Mouth: Teeth present, mucosa pink and moist, + PND, no exudate, lesions or ulcerations noted.  Neck:  No adenopathy noted. Pulmonary/Chest: Normal effort and positive vesicular breath sounds. No respiratory distress. No wheezes, rales or ronchi noted.   BMET    Component Value Date/Time   NA 136 01/10/2017 1101   NA 138 09/10/2013 0758   K 3.8 01/10/2017 1101   K 3.3 (L) 09/10/2013 0758   CL 99 (L) 01/10/2017 1101   CL 100 09/10/2013 0758   CO2 30 01/10/2017 1101   CO2 32 09/10/2013 0758   GLUCOSE 90 01/10/2017 1101   GLUCOSE 85 09/10/2013 0758   BUN 15 01/10/2017 1101   BUN 13 09/10/2013 0758   CREATININE 1.15 (H) 01/10/2017 1101   CREATININE 0.96 12/22/2013 0857   CALCIUM 9.2 01/10/2017 1101   CALCIUM 9.2 09/10/2013 0758   GFRNONAA 45 (L) 01/10/2017 1101   GFRNONAA 59 (L) 12/22/2013 0857   GFRAA 53 (L) 01/10/2017 1101   GFRAA 68 12/22/2013 0857    Lipid Panel     Component Value Date/Time   CHOL 171 02/19/2017 1039   TRIG 117.0 02/19/2017 1039   HDL 40.00 02/19/2017 1039   CHOLHDL 4 02/19/2017 1039   VLDL 23.4 02/19/2017 1039   LDLCALC 108 (H) 02/19/2017 1039    CBC    Component Value Date/Time   WBC 6.9 01/10/2017 1101   RBC 3.98 01/10/2017 1101   HGB 12.9 01/10/2017 1101   HGB 13.1 09/10/2013 0758   HCT 37.1 01/10/2017 1101   HCT 39.5 09/10/2013 0758   PLT 237 01/10/2017 1101   PLT 233 09/10/2013 0758   MCV 93.3 01/10/2017 1101   MCV 93  09/10/2013 0758   MCH 32.4 01/10/2017 1101   MCHC 34.7 01/10/2017 1101   RDW 13.4 01/10/2017 1101   RDW 13.2 09/10/2013 0758   LYMPHSABS 2.0 01/10/2017 1101   MONOABS 0.7 01/10/2017 1101   EOSABS 0.3 01/10/2017 1101   BASOSABS 0.0 01/10/2017 1101  Hgb A1C Lab Results  Component Value Date   HGBA1C 6.0 02/19/2017            Assessment & Plan:   Viral URI with Cough:  Get some rest and drink plenty of fluids Continue Mucinex Add in Flonase eRx for Tussionex for cough  Return precautions discussed Webb Silversmith, NP

## 2017-06-26 ENCOUNTER — Other Ambulatory Visit: Payer: Self-pay | Admitting: Internal Medicine

## 2017-07-02 DIAGNOSIS — J301 Allergic rhinitis due to pollen: Secondary | ICD-10-CM | POA: Diagnosis not present

## 2017-07-10 ENCOUNTER — Inpatient Hospital Stay: Payer: Medicare Other | Attending: Internal Medicine

## 2017-07-10 ENCOUNTER — Ambulatory Visit
Admission: RE | Admit: 2017-07-10 | Discharge: 2017-07-10 | Disposition: A | Discharge status: Home / Self Care | Payer: Medicare Other | Source: Ambulatory Visit | Attending: Internal Medicine | Admitting: Internal Medicine

## 2017-07-10 DIAGNOSIS — M129 Arthropathy, unspecified: Secondary | ICD-10-CM | POA: Insufficient documentation

## 2017-07-10 DIAGNOSIS — C8213 Follicular lymphoma grade II, intra-abdominal lymph nodes: Secondary | ICD-10-CM | POA: Insufficient documentation

## 2017-07-10 DIAGNOSIS — Z8572 Personal history of non-Hodgkin lymphomas: Secondary | ICD-10-CM | POA: Diagnosis not present

## 2017-07-10 DIAGNOSIS — R011 Cardiac murmur, unspecified: Secondary | ICD-10-CM | POA: Diagnosis not present

## 2017-07-10 DIAGNOSIS — Z9049 Acquired absence of other specified parts of digestive tract: Secondary | ICD-10-CM | POA: Insufficient documentation

## 2017-07-10 DIAGNOSIS — Z8052 Family history of malignant neoplasm of bladder: Secondary | ICD-10-CM | POA: Insufficient documentation

## 2017-07-10 DIAGNOSIS — Z85828 Personal history of other malignant neoplasm of skin: Secondary | ICD-10-CM | POA: Insufficient documentation

## 2017-07-10 DIAGNOSIS — I10 Essential (primary) hypertension: Secondary | ICD-10-CM | POA: Insufficient documentation

## 2017-07-10 DIAGNOSIS — Z87442 Personal history of urinary calculi: Secondary | ICD-10-CM | POA: Insufficient documentation

## 2017-07-10 DIAGNOSIS — K219 Gastro-esophageal reflux disease without esophagitis: Secondary | ICD-10-CM | POA: Diagnosis not present

## 2017-07-10 DIAGNOSIS — N281 Cyst of kidney, acquired: Secondary | ICD-10-CM | POA: Diagnosis not present

## 2017-07-10 DIAGNOSIS — Z86010 Personal history of colonic polyps: Secondary | ICD-10-CM | POA: Diagnosis not present

## 2017-07-10 DIAGNOSIS — Z923 Personal history of irradiation: Secondary | ICD-10-CM | POA: Diagnosis not present

## 2017-07-10 DIAGNOSIS — E785 Hyperlipidemia, unspecified: Secondary | ICD-10-CM | POA: Insufficient documentation

## 2017-07-10 DIAGNOSIS — J40 Bronchitis, not specified as acute or chronic: Secondary | ICD-10-CM | POA: Insufficient documentation

## 2017-07-10 DIAGNOSIS — Z803 Family history of malignant neoplasm of breast: Secondary | ICD-10-CM | POA: Insufficient documentation

## 2017-07-10 DIAGNOSIS — C8590 Non-Hodgkin lymphoma, unspecified, unspecified site: Secondary | ICD-10-CM | POA: Diagnosis not present

## 2017-07-10 LAB — CBC WITH DIFFERENTIAL/PLATELET
BASOS ABS: 0 10*3/uL (ref 0–0.1)
Basophils Relative: 1 %
EOS ABS: 0.2 10*3/uL (ref 0–0.7)
EOS PCT: 3 %
HCT: 39.2 % (ref 35.0–47.0)
Hemoglobin: 13.4 g/dL (ref 12.0–16.0)
LYMPHS PCT: 30 %
Lymphs Abs: 1.7 10*3/uL (ref 1.0–3.6)
MCH: 32.3 pg (ref 26.0–34.0)
MCHC: 34.1 g/dL (ref 32.0–36.0)
MCV: 94.6 fL (ref 80.0–100.0)
MONO ABS: 0.4 10*3/uL (ref 0.2–0.9)
Monocytes Relative: 8 %
Neutro Abs: 3.3 10*3/uL (ref 1.4–6.5)
Neutrophils Relative %: 58 %
PLATELETS: 249 10*3/uL (ref 150–440)
RBC: 4.14 MIL/uL (ref 3.80–5.20)
RDW: 13.5 % (ref 11.5–14.5)
WBC: 5.7 10*3/uL (ref 3.6–11.0)

## 2017-07-10 LAB — COMPREHENSIVE METABOLIC PANEL
ALK PHOS: 62 U/L (ref 38–126)
ALT: 19 U/L (ref 14–54)
AST: 23 U/L (ref 15–41)
Albumin: 4 g/dL (ref 3.5–5.0)
Anion gap: 6 (ref 5–15)
BILIRUBIN TOTAL: 0.9 mg/dL (ref 0.3–1.2)
BUN: 14 mg/dL (ref 6–20)
CALCIUM: 9.1 mg/dL (ref 8.9–10.3)
CO2: 29 mmol/L (ref 22–32)
CREATININE: 1.14 mg/dL — AB (ref 0.44–1.00)
Chloride: 102 mmol/L (ref 101–111)
GFR calc Af Amer: 53 mL/min — ABNORMAL LOW (ref >60)
GFR calc non Af Amer: 46 mL/min — ABNORMAL LOW (ref >60)
Glucose, Bld: 98 mg/dL (ref 65–99)
POTASSIUM: 4 mmol/L (ref 3.5–5.1)
Sodium: 137 mmol/L (ref 135–145)
TOTAL PROTEIN: 7.7 g/dL (ref 6.5–8.1)

## 2017-07-10 LAB — GLUCOSE, CAPILLARY: GLUCOSE-CAPILLARY: 82 mg/dL (ref 65–99)

## 2017-07-10 MED ORDER — FLUDEOXYGLUCOSE F - 18 (FDG) INJECTION
12.0000 | Freq: Once | INTRAVENOUS | Status: AC
  Administered 2017-07-10: 12.5 via INTRAVENOUS

## 2017-07-11 DIAGNOSIS — J301 Allergic rhinitis due to pollen: Secondary | ICD-10-CM | POA: Diagnosis not present

## 2017-07-11 LAB — IMMUNOGLOBULINS A/E/G/M, SERUM
IGM (IMMUNOGLOBULIN M), SRM: 29 mg/dL (ref 26–217)
IgA: 41 mg/dL — ABNORMAL LOW (ref 64–422)
IgE (Immunoglobulin E), Serum: 36 IU/mL (ref 0–100)
IgG (Immunoglobin G), Serum: 1866 mg/dL — ABNORMAL HIGH (ref 700–1600)

## 2017-07-12 ENCOUNTER — Inpatient Hospital Stay (HOSPITAL_BASED_OUTPATIENT_CLINIC_OR_DEPARTMENT_OTHER): Payer: Medicare Other | Admitting: Internal Medicine

## 2017-07-12 VITALS — BP 153/92 | HR 76 | Temp 97.9°F | Resp 16 | Wt 153.0 lb

## 2017-07-12 DIAGNOSIS — I10 Essential (primary) hypertension: Secondary | ICD-10-CM

## 2017-07-12 DIAGNOSIS — M129 Arthropathy, unspecified: Secondary | ICD-10-CM

## 2017-07-12 DIAGNOSIS — N281 Cyst of kidney, acquired: Secondary | ICD-10-CM | POA: Diagnosis not present

## 2017-07-12 DIAGNOSIS — K219 Gastro-esophageal reflux disease without esophagitis: Secondary | ICD-10-CM

## 2017-07-12 DIAGNOSIS — Z923 Personal history of irradiation: Secondary | ICD-10-CM

## 2017-07-12 DIAGNOSIS — R011 Cardiac murmur, unspecified: Secondary | ICD-10-CM

## 2017-07-12 DIAGNOSIS — J40 Bronchitis, not specified as acute or chronic: Secondary | ICD-10-CM

## 2017-07-12 DIAGNOSIS — Z85828 Personal history of other malignant neoplasm of skin: Secondary | ICD-10-CM

## 2017-07-12 DIAGNOSIS — Z9049 Acquired absence of other specified parts of digestive tract: Secondary | ICD-10-CM | POA: Diagnosis not present

## 2017-07-12 DIAGNOSIS — Z8572 Personal history of non-Hodgkin lymphomas: Secondary | ICD-10-CM | POA: Diagnosis not present

## 2017-07-12 DIAGNOSIS — Z86010 Personal history of colonic polyps: Secondary | ICD-10-CM

## 2017-07-12 DIAGNOSIS — Z8052 Family history of malignant neoplasm of bladder: Secondary | ICD-10-CM | POA: Diagnosis not present

## 2017-07-12 DIAGNOSIS — E785 Hyperlipidemia, unspecified: Secondary | ICD-10-CM | POA: Diagnosis not present

## 2017-07-12 DIAGNOSIS — Z803 Family history of malignant neoplasm of breast: Secondary | ICD-10-CM

## 2017-07-12 DIAGNOSIS — Z87442 Personal history of urinary calculi: Secondary | ICD-10-CM

## 2017-07-12 DIAGNOSIS — C8213 Follicular lymphoma grade II, intra-abdominal lymph nodes: Secondary | ICD-10-CM

## 2017-07-12 NOTE — Assessment & Plan Note (Addendum)
#   RETROPERITONEAL LN [incidental/asymptomatic]- follicular lymphoma grade 2; likely stage II; status post rituximab.  # FEB 2019 PET scan NED. Clinically no evidence of recurrence.   # Bronchitis- improving;resolved;   # Large stable benign renal cyst normal kidney function. No recommendations at this time [previously followed by Dr.wolfe]  # follow up in 6 months/labs.   # 25 minutes face-to-face with the patient discussing the above plan of care; more than 50% of time spent on prognosis/ natural history; counseling and coordination.  # I reviewed the blood work- with the patient in detail; also reviewed the imaging independently [as summarized above]; and with the patient in detail.    Cc: Dr.Tullo

## 2017-07-12 NOTE — Progress Notes (Signed)
[place] OFFICE PROGRESS NOTE  Patient Care Team: [name], MD as PCP - General (Internal Medicine) Bary Castilla Forest Gleason, MD (General Surgery)   SUMMARY OF ONCOLOGIC HISTORY: Oncology History    # NOV 6440- FOLLICULAR LYMPHOMA G-2; [s/p Bx- RETROPERITONEAL/LEFT Peri-aortic LN ~ 2.5CM [incidental on CT scan in 2016; CT- Nov 2014- ~1CM]/ PET 2016-Left Peri-aortic LN; Suv ~15; Ext Iliac LN 6 mm; Feb 2017- Unchanged LN; START Rituxan q week x4 [finished march 15th]; PET JAN 30th 2018- NED.   # Hx of Shingles [Sep 3474]-      Follicular lymphoma grade ii, intra-abdominal lymph nodes ([facility])      INTERVAL HISTORY: A very pleasant 77 year old female patient with history of follicular lymphoma grade 2 retroperitoneal is currently status post 4 infusions of rituximab single agent [Finished February 2017] is here for follow-up/instituted with the results of her PET scan.  Continues to deny any weight loss or any night sweats or fevers. No nausea no vomiting. No abdominal pain.  Denies any chest pain or shortness of breath.  Denies any lumps or bumps.  REVIEW OF SYSTEMS:  A complete 10 point review of system is done which is negative except mentioned above/history of present illness.   PAST MEDICAL HISTORY :  Past Medical History:  Diagnosis Date  . Basal cell carcinoma of nose   . Bronchitis   . GERD (gastroesophageal reflux disease)   . Heart murmur   . Hemorrhoids   . History of colon polyps   . History of kidney stones   . History of mammogram 2016  . Hyperlipidemia   . Hypertension   . Primary squamous cell carcinoma of chest wall ([place]) 2004   removed at [facility]    PAST SURGICAL HISTORY :   Past Surgical History:  Procedure Laterality Date  . ABDOMINAL HYSTERECTOMY  1984  . BLADDER SUSPENSION  2010  . CHOLECYSTECTOMY  06-01-03  . COLONOSCOPY  2003  . LITHOTRIPSY  2005  . local exicsion skin cancer  2004   squamous cell of chest wall. left chest  .  TONSILLECTOMY  1971  . TUBAL LIGATION  1975  . VAGINAL PROLAPSE REPAIR  July 2001   Pelvic Prolapse    FAMILY HISTORY :   Family History  Problem Relation Age of Onset  . Stroke Mother   . Breast cancer Mother 19  . Stroke Father   . Cancer Brother        bladder  . Stroke Brother   . Breast cancer Paternal Aunt 94    SOCIAL HISTORY:   Social History   Tobacco Use  . Smoking status: Never Smoker  . Smokeless tobacco: Never Used  Substance Use Topics  . Alcohol use: No  . Drug use: No    ALLERGIES:  has No Known Allergies.  MEDICATIONS:  Current Outpatient Medications  Medication Sig Dispense Refill  . budesonide-formoterol (SYMBICORT) 80-4.5 MCG/ACT inhaler Inhale 2 puffs into the lungs 2 (two) times daily. 1 Inhaler 12  . Cholecalciferol (VITAMIN D3) 1000 units CAPS Take 1,000 Units by mouth daily.    Marland Kitchen escitalopram (LEXAPRO) 10 MG tablet TAKE 1 TABLET BY MOUTH ONCE DAILY 90 tablet 1  . hyoscyamine (LEVBID) 0.375 MG 12 hr tablet TAKE 1 TABLET BY MOUTH ONCE DAILY 90 tablet 1  . losartan (COZAAR) 100 MG tablet TAKE ONE TABLET BY MOUTH EVERY DAY 90 tablet 1  . metoprolol succinate (TOPROL-XL) 25 MG 24 hr tablet TAKE ONE (1) TABLET  BY MOUTH EVERY DAY 90 tablet 3  . potassium chloride SA (K-DUR,KLOR-CON) 20 MEQ tablet TAKE 1 TABLET BY MOUTH ONCE DAILY 90 tablet 0  . Probiotic Product (HEALTHY COLON PO) Take 1 capsule by mouth daily.    . simvastatin (ZOCOR) 40 MG tablet TAKE 1 TABLET EACH EVENING 90 tablet 1  . triamterene-hydrochlorothiazide (MAXZIDE-25) 37.5-25 MG tablet Take 0.5 tablets by mouth daily. 90 tablet 3  . VENTOLIN HFA 108 (90 Base) MCG/ACT inhaler Inhale 1 puff into the lungs every 6 (six) hours as needed for wheezing or shortness of breath.      No current facility-administered medications for this visit.     PHYSICAL EXAMINATION: ECOG PERFORMANCE STATUS: 0 - Asymptomatic  BP (!) 153/92 (BP Location: Left Arm, Patient Position: Sitting)   Pulse 76    Temp 97.9 F (36.6 C) (Tympanic)   Resp 16   Wt 153 lb (69.4 kg)   BMI 27.10 kg/m   Filed Weights   07/12/17 1036  Weight: 153 lb (69.4 kg)   GENERAL: Well-nourished well-developed; Alert, no distress and comfortable. Accompanied her husband. EYES: no pallor or icterus OROPHARYNX: no thrush or ulceration; good dentition  NECK: supple, no masses felt LYMPH: no palpable lymphadenopathy in the cervical, axillary or inguinal regions LUNGS: clear to auscultation and No wheeze or crackles HEART/CVS: regular rate & rhythm and no murmurs; No lower extremity edema ABDOMEN: abdomen soft, non-tender and normal bowel sounds Musculoskeletal:no cyanosis of digits and no clubbing  PSYCH: alert & oriented x 3 with fluent speech NEURO: no focal motor/sensory deficits SKIN: no rashes or significant lesions  LABORATORY DATA:  I have reviewed the data as listed    Component Value Date/Time   NA 137 07/10/2017 0845   NA 138 09/10/2013 0758   K 4.0 07/10/2017 0845   K 3.3 (L) 09/10/2013 0758   CL 102 07/10/2017 0845   CL 100 09/10/2013 0758   CO2 29 07/10/2017 0845   CO2 32 09/10/2013 0758   GLUCOSE 98 07/10/2017 0845   GLUCOSE 85 09/10/2013 0758   BUN 14 07/10/2017 0845   BUN 13 09/10/2013 0758   CREATININE 1.14 (H) 07/10/2017 0845   CREATININE 0.96 12/22/2013 0857   CALCIUM 9.1 07/10/2017 0845   CALCIUM 9.2 09/10/2013 0758   PROT 7.7 07/10/2017 0845   PROT 8.3 (H) 09/10/2013 0758   ALBUMIN 4.0 07/10/2017 0845   ALBUMIN 3.5 09/10/2013 0758   AST 23 07/10/2017 0845   AST 23 09/10/2013 0758   ALT 19 07/10/2017 0845   ALT 28 09/10/2013 0758   ALKPHOS 62 07/10/2017 0845   ALKPHOS 78 09/10/2013 0758   BILITOT 0.9 07/10/2017 0845   BILITOT 0.6 09/10/2013 0758   GFRNONAA 46 (L) 07/10/2017 0845   GFRNONAA 59 (L) 12/22/2013 0857   GFRAA 53 (L) 07/10/2017 0845   GFRAA 68 12/22/2013 0857    No results found for: SPEP, UPEP  Lab Results  Component Value Date   WBC 5.7  07/10/2017   NEUTROABS 3.3 07/10/2017   HGB 13.4 07/10/2017   HCT 39.2 07/10/2017   MCV 94.6 07/10/2017   PLT 249 07/10/2017      Chemistry      Component Value Date/Time   NA 137 07/10/2017 0845   NA 138 09/10/2013 0758   K 4.0 07/10/2017 0845   K 3.3 (L) 09/10/2013 0758   CL 102 07/10/2017 0845   CL 100 09/10/2013 0758   CO2 29 07/10/2017 0845   CO2 32 09/10/2013  0758   BUN 14 07/10/2017 0845   BUN 13 09/10/2013 0758   CREATININE 1.14 (H) 07/10/2017 0845   CREATININE 0.96 12/22/2013 0857      Component Value Date/Time   CALCIUM 9.1 07/10/2017 0845   CALCIUM 9.2 09/10/2013 0758   ALKPHOS 62 07/10/2017 0845   ALKPHOS 78 09/10/2013 0758   AST 23 07/10/2017 0845   AST 23 09/10/2013 0758   ALT 19 07/10/2017 0845   ALT 28 09/10/2013 0758   BILITOT 0.9 07/10/2017 0845   BILITOT 0.6 09/10/2013 0758     IMPRESSION: No findings for recurrent lymphoma.   Electronically Signed   By: Marijo Sanes M.D.   On: 07/10/2017 13:25   ASSESSMENT & PLAN:  Follicular lymphoma grade ii, intra-abdominal lymph nodes ([place]) # RETROPERITONEAL LN [incidental/asymptomatic]- follicular lymphoma grade 2; likely stage II; status post rituximab.  # FEB 2019 PET scan NED. Clinically no evidence of recurrence.   # Bronchitis- improving;resolved;   # Large stable benign renal cyst normal kidney function. No recommendations at this time [previously followed by Dr.wolfe]  # follow up in 6 months/labs.   # 25 minutes face-to-face with the patient discussing the above plan of care; more than 50% of time spent on prognosis/ natural history; counseling and coordination.  # I reviewed the blood work- with the patient in detail; also reviewed the imaging independently [as summarized above]; and with the patient in detail.    Cc: Dr.Tullo       Cammie Sickle, MD 07/19/2017 8:45 AM

## 2017-07-16 DIAGNOSIS — J301 Allergic rhinitis due to pollen: Secondary | ICD-10-CM | POA: Diagnosis not present

## 2017-07-20 DIAGNOSIS — J301 Allergic rhinitis due to pollen: Secondary | ICD-10-CM | POA: Diagnosis not present

## 2017-07-23 DIAGNOSIS — J301 Allergic rhinitis due to pollen: Secondary | ICD-10-CM | POA: Diagnosis not present

## 2017-07-30 DIAGNOSIS — J301 Allergic rhinitis due to pollen: Secondary | ICD-10-CM | POA: Diagnosis not present

## 2017-08-13 ENCOUNTER — Ambulatory Visit (INDEPENDENT_AMBULATORY_CARE_PROVIDER_SITE_OTHER): Payer: Medicare Other | Admitting: Internal Medicine

## 2017-08-13 ENCOUNTER — Encounter: Payer: Self-pay | Admitting: Internal Medicine

## 2017-08-13 VITALS — BP 142/82 | HR 75 | Ht 63.0 in | Wt 155.0 lb

## 2017-08-13 DIAGNOSIS — J40 Bronchitis, not specified as acute or chronic: Secondary | ICD-10-CM | POA: Diagnosis not present

## 2017-08-13 DIAGNOSIS — J449 Chronic obstructive pulmonary disease, unspecified: Secondary | ICD-10-CM | POA: Diagnosis not present

## 2017-08-13 MED ORDER — BUDESONIDE-FORMOTEROL FUMARATE 80-4.5 MCG/ACT IN AERO: 2.0000 | INHALATION_SPRAY | Freq: Two times a day (BID) | RESPIRATORY_TRACT | 11 refills | Status: DC

## 2017-08-13 MED ORDER — VENTOLIN HFA 108 (90 BASE) MCG/ACT IN AERS: 1.0000 | INHALATION_SPRAY | Freq: Four times a day (QID) | RESPIRATORY_TRACT | 6 refills | Status: DC | PRN

## 2017-08-13 NOTE — Progress Notes (Signed)
   Name: Whitney Molina MRN: [id number] DOB: 08-21-1940     CONSULTATION DATE: 08/13/2017  REFERRING MD : Vidal Schwalbe  CHIEF COMPLAINT:  Follow up COPD  STUDIES:  CXR 01/02/17 Interpretation: No acute opacifications no effusions hyperinflation of the lungs     HISTORY OF PRESENT ILLNESS:   77 year old pleasant white female seen today for chronic cough Has dx of mild COPD  Episodic viral bronchitis last 2 months responded well to prednisone and z pak   REVIEW OF SYSTEMS:   Constitutional: Negative for fever, chills, weight loss, malaise/fatigue and diaphoresis.  HENT: Negative for hearing loss, ear pain, nosebleeds, congestion, sore throat, neck pain, tinnitus and ear discharge.   Eyes: Negative for blurred vision, double vision, photophobia, pain, discharge and redness.  Respiratory: +cough, hemoptysis, +sputum production, +shortness of breath, +wheezing and stridor.   Cardiovascular: Negative for chest pain, palpitations, orthopnea, claudication, leg swelling and PND.  ALL OTHER ROS ARE NEGATIVE    BP (!) 142/82 (BP Location: Left Arm, Cuff Size: Normal)   Pulse 75   Ht 5\' 3"  (1.6 m)   Wt 155 lb (70.3 kg)   SpO2 96%   BMI 27.46 kg/m    Physical Examination:   GENERAL:NAD, no fevers, chills, no weakness no fatigue HEAD: Normocephalic, atraumatic.  EYES: Pupils equal, round, reactive to light. Extraocular muscles intact. No scleral icterus.  MOUTH: Moist mucosal membrane.   EAR, NOSE, THROAT: Clear without exudates. No external lesions.  NECK: Supple. No thyromegaly. No nodules. No JVD.  PULMONARY:CTA B/L no wheezes, no crackles, no rhonchi CARDIOVASCULAR: S1 and S2. Regular rate and rhythm. No murmurs, rubs, or gallops. No edema.     ASSESSMENT / PLAN: 77 year old pleasant white female seen today  COPD Mild Gold Stage A office spirometry that has a ratio of 64% predicted with an FEV1 of 84% predicted which is suggestive of mild COPD gold stage a based on her  symptoms  #1 chronic cough likely related to underlying COPD  #2 COPD stable-no signs of exacerbation No signs of infection at this time  #3 mild COPD gold stage A Recommend continuing Symbicort inhaler Continue albuterol/Ventolin as needed  #4 Deconditioned state -Recommend increased daily activity and exercise   No further recommendations at this time  Patient/Family are satisfied with Plan of action and management. All questions answered Follow up in 6 months  [name] Patricia Pesa, M.D.  [facility]  Medical Director [place] Director [facility] Cardio-Pulmonary Department

## 2017-08-13 NOTE — Patient Instructions (Signed)
Continue Symbicort Albuterol as needed

## 2017-08-17 ENCOUNTER — Other Ambulatory Visit: Payer: Self-pay | Admitting: Internal Medicine

## 2017-08-20 DIAGNOSIS — J301 Allergic rhinitis due to pollen: Secondary | ICD-10-CM | POA: Diagnosis not present

## 2017-08-27 DIAGNOSIS — J301 Allergic rhinitis due to pollen: Secondary | ICD-10-CM | POA: Diagnosis not present

## 2017-09-03 DIAGNOSIS — J301 Allergic rhinitis due to pollen: Secondary | ICD-10-CM | POA: Diagnosis not present

## 2017-09-06 ENCOUNTER — Ambulatory Visit: Payer: Medicare Other

## 2017-09-10 ENCOUNTER — Ambulatory Visit (INDEPENDENT_AMBULATORY_CARE_PROVIDER_SITE_OTHER): Payer: Medicare Other

## 2017-09-10 VITALS — BP 138/80 | HR 62 | Temp 98.7°F | Resp 14 | Ht 62.5 in | Wt 152.8 lb

## 2017-09-10 DIAGNOSIS — Z0000 Encounter for general adult medical examination without abnormal findings: Secondary | ICD-10-CM | POA: Diagnosis not present

## 2017-09-10 DIAGNOSIS — E538 Deficiency of other specified B group vitamins: Secondary | ICD-10-CM | POA: Diagnosis not present

## 2017-09-10 DIAGNOSIS — J301 Allergic rhinitis due to pollen: Secondary | ICD-10-CM | POA: Diagnosis not present

## 2017-09-10 LAB — VITAMIN B12: Vitamin B-12: 346 pg/mL (ref 211–911)

## 2017-09-10 NOTE — Patient Instructions (Addendum)
  Whitney Molina , Thank you for taking time to come for your Medicare Wellness Visit. I appreciate your ongoing commitment to your health goals. Please review the following plan we discussed and let me know if I can assist you in the future.   Follow up as needed.    Have a great day!  These are the goals we discussed: Goals    . Reduce sugar intake     Low carb foods       This is a list of the screening recommended for you and due dates:  Health Maintenance  Topic Date Due  . Flu Shot  12/13/2017  . Tetanus Vaccine  11/22/2022  . DEXA scan (bone density measurement)  Completed  . Pneumonia vaccines  Completed

## 2017-09-10 NOTE — Progress Notes (Signed)
Subjective:   Whitney Molina is a 77 y.o. female who presents for Medicare Annual (Subsequent) preventive examination.  Review of Systems:  No ROS.  Medicare Wellness Visit. Additional risk factors are reflected in the social history.   Cardiac Risk Factors include: advanced age (>78men, >16 women);hypertension     Objective:     Vitals: BP 138/80 (BP Location: Left Arm, Patient Position: Sitting, Cuff Size: Normal)   Pulse 62   Temp 98.7 F (37.1 C) (Oral)   Resp 14   Ht 5' 2.5" (1.588 m)   Wt 152 lb 12.8 oz (69.3 kg)   SpO2 98%   BMI 27.50 kg/m   Body mass index is 27.5 kg/m.  Advanced Directives 09/10/2017 07/12/2017 01/10/2017 09/04/2016 06/21/2016 02/04/2016 08/11/2015  Does Patient Have a Medical Advance Directive? No No No No No No No  Would patient like information on creating a medical advance directive? No - Patient declined No - Patient declined No - Patient declined No - Patient declined No - Patient declined No - patient declined information -    Tobacco Social History   Tobacco Use  Smoking Status Never Smoker  Smokeless Tobacco Never Used     Counseling given: Not Answered   Clinical Intake:  Pre-visit preparation completed: Yes  Pain : No/denies pain     Nutritional Status: BMI 25 -29 Overweight Diabetes: No  How often do you need to have someone help you when you read instructions, pamphlets, or other written materials from your doctor or pharmacy?: 1 - Never  Interpreter Needed?: No     Past Medical History:  Diagnosis Date  . Basal cell carcinoma of nose   . Bronchitis   . GERD (gastroesophageal reflux disease)   . Heart murmur   . Hemorrhoids   . History of colon polyps   . History of kidney stones   . History of mammogram 2016  . Hyperlipidemia   . Hypertension   . 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 ABDOMINAL HYSTERECTOMY   1984  . BLADDER SUSPENSION  2010  . CHOLECYSTECTOMY  06-01-03  . COLONOSCOPY  2003  . LITHOTRIPSY  2005  . local exicsion skin cancer  2004   squamous cell of chest wall. left chest  . TONSILLECTOMY  1971  . TUBAL LIGATION  1975  . VAGINAL PROLAPSE REPAIR  July 2001   Pelvic Prolapse   Family History  Problem Relation Age of Onset  . Stroke Mother   . Breast cancer Mother 88  . Stroke Father   . Dementia Father   . Cancer Brother        bladder  . Stroke Brother   . Breast cancer Paternal Aunt 46  . Bipolar disorder Son    Social History   Socioeconomic History  . Marital status: Married    Spouse name: Not on file  . Number of children: Not on file  . Years of education: Not on file  . Highest education level: Not on file  Occupational History  . Not on file  Social Needs  . Financial resource strain: Not hard at all  . Food insecurity:    Worry: Never true    Inability: Never true  . Transportation needs:    Medical: No    Non-medical: No  Tobacco Use  . Smoking status: Never Smoker  . Smokeless tobacco: Never Used  Substance and Sexual Activity  . Alcohol use: No  . Drug use: No  . Sexual activity: Yes  Lifestyle  . Physical activity:    Days per week: Not on file    Minutes per session: Not on file  . Stress: Not on file  Relationships  . Social connections:    Talks on phone: Not on file    Gets together: Not on file    Attends religious service: Not on file    Active member of club or organization: Not on file    Attends meetings of clubs or organizations: Not on file    Relationship status: Not on file  Other Topics Concern  . Not on file  Social History Narrative  . Not on file    Outpatient Encounter Medications as of 09/10/2017  Medication Sig  . budesonide-formoterol (SYMBICORT) 80-4.5 MCG/ACT inhaler Inhale 2 puffs into the lungs 2 (two) times daily.  . Cholecalciferol (VITAMIN D3) 1000 units CAPS Take 1,000 Units by mouth daily.  Marland Kitchen  escitalopram (LEXAPRO) 10 MG tablet TAKE 1 TABLET BY MOUTH ONCE DAILY  . hyoscyamine (LEVBID) 0.375 MG 12 hr tablet TAKE 1 TABLET BY MOUTH ONCE DAILY  . losartan (COZAAR) 100 MG tablet Take 1 tablet (100 mg total) by mouth daily. Needs appt with pcp  . metoprolol succinate (TOPROL-XL) 25 MG 24 hr tablet TAKE ONE (1) TABLET BY MOUTH EVERY DAY  . potassium chloride SA (K-DUR,KLOR-CON) 20 MEQ tablet TAKE 1 TABLET BY MOUTH ONCE DAILY  . Probiotic Product (HEALTHY COLON PO) Take 1 capsule by mouth daily.  . simvastatin (ZOCOR) 40 MG tablet TAKE 1 TABLET EACH EVENING  . triamterene-hydrochlorothiazide (MAXZIDE-25) 37.5-25 MG tablet Take 0.5 tablets by mouth daily.  . VENTOLIN HFA 108 (90 Base) MCG/ACT inhaler Inhale 1 puff into the lungs every 6 (six) hours as needed for wheezing or shortness of breath.   No facility-administered encounter medications on file as of 09/10/2017.     Activities of Daily Living In your present state of health, do you have any difficulty performing the following activities: 09/10/2017  Hearing? N  Vision? N  Difficulty concentrating or making decisions? N  Walking or climbing stairs? N  Dressing or bathing? N  Doing errands, shopping? N  Preparing Food and eating ? N  Using the Toilet? N  In the past six months, have you accidently leaked urine? N  Do you have problems with loss of bowel control? N  Managing your Medications? N  Managing your Finances? N  Housekeeping or managing your Housekeeping? N  Some recent data might be hidden    Patient Care Team: [name], MD as PCP - General (Internal Medicine) Bary Castilla Forest Gleason, MD (General Surgery)    Assessment:   This is a routine wellness examination for Whitney Molina.  The goal of the wellness visit is to assist the patient how to close the gaps in care and create a preventative care plan for the patient.   The roster of all physicians providing medical care to patient is listed in the Snapshot section of  the chart.  Taking calcium VIT D as appropriate/Osteoporosis reviewed.    Safety issues reviewed; Smoke and carbon monoxide detectors in the home. No firearms or firearms locked in a safe within the home. Wears seatbelts when driving or riding with others. No violence in the home.  They do not have excessive sun exposure.  Discussed the need for sun protection: hats, long sleeves and the use  of sunscreen if there is significant sun exposure.  Patient is alert, normal appearance, oriented to person/place/and time.  Correctly identified the president of the [place] and recalls of 3/3 words. Performs simple calculations and can read correct time from watch face.  Displays appropriate judgement.  No new identified risk were noted.  No failures at ADL's or IADL's.    BMI- discussed the importance of a healthy diet, water intake and the benefits of aerobic exercise. Educational material provided.   24 hour diet recall: Regular diet  Dental- UTD  Eye- Visual acuity not assessed per patient preference since they have regular follow up with the ophthalmologist.  Wears corrective lenses.  Sleep patterns- Sleeps through the night without issues.    Health maintenance gaps- closed.  Patient Concerns: Muscle and joint pain with simvastatin.  Long term use of Lexapro attributing to memory delay.  Meloxicam refill.  Fatigue.  Appointment scheduled for follow up with PCP.    B12 lab drawn today per patient request.     Exercise Activities and Dietary recommendations Current Exercise Habits: Home exercise routine, Time (Minutes): 20, Frequency (Times/Week): 4, Weekly Exercise (Minutes/Week): 80, Intensity: Mild  Goals    . Reduce sugar intake     Low carb foods       Fall Risk Fall Risk  09/10/2017 01/23/2017 09/04/2016 09/03/2015 05/20/2015  Falls in the past year? No No No No No   Depression Screen PHQ 2/9 Scores 09/10/2017 01/23/2017 09/04/2016 09/03/2015  PHQ - 2 Score 0 0 0 0      Cognitive Function MMSE - Mini Mental State Exam 09/10/2017 09/04/2016 05/20/2015  Orientation to time 5 5 5   Orientation to Place 5 5 5   Registration 3 3 3   Attention/ Calculation 5 5 5   Recall 3 2 3   Language- name 2 objects 2 2 2   Language- repeat 1 1 1   Language- follow 3 step command 3 3 3   Language- read & follow direction 1 1 1   Write a sentence 1 1 1   Copy design 1 1 1   Total score 30 29 30         Immunization History  Administered Date(s) Administered  . Influenza Split 01/16/2013, 02/26/2014  . Influenza, High Dose Seasonal PF 01/10/2016, 02/19/2017  . Influenza,inj,Quad PF,6+ Mos 04/21/2015  . Pneumococcal Conjugate-13 06/24/2014  . Pneumococcal-Unspecified 05/16/2011  . Td 11/12/2012  . Tdap 11/21/2012  . Zoster 02/23/2015   Screening Tests Health Maintenance  Topic Date Due  . INFLUENZA VACCINE  12/13/2017  . TETANUS/TDAP  11/22/2022  . DEXA SCAN  Completed  . PNA vac Low Risk Adult  Completed      Plan:   End of life planning; Advanced aging; Advanced directives discussed.  No HCPOA/Living Will.  Additional information declined at this time.  I have personally reviewed and noted the following in the patient's chart:   . Medical and social history . Use of alcohol, tobacco or illicit drugs  . Current medications and supplements . Functional ability and status . Nutritional status . Physical activity . Advanced directives . List of other physicians . Hospitalizations, surgeries, and ER visits in previous 12 months . Vitals . Screenings to include cognitive, depression, and falls . Referrals and appointments  In addition, I have reviewed and discussed with patient certain preventive protocols, quality metrics, and best practice recommendations. A written personalized care plan for preventive services as well as general preventive health recommendations were provided to patient.     Lynder Parents  L, LPN  [phone]

## 2017-09-17 ENCOUNTER — Ambulatory Visit: Payer: Medicare Other | Admitting: Internal Medicine

## 2017-09-17 DIAGNOSIS — J301 Allergic rhinitis due to pollen: Secondary | ICD-10-CM | POA: Diagnosis not present

## 2017-09-23 ENCOUNTER — Other Ambulatory Visit: Payer: Self-pay

## 2017-09-23 ENCOUNTER — Inpatient Hospital Stay
Admission: EM | Admit: 2017-09-23 | Discharge: 2017-09-23 | DRG: 202 | Disposition: A | Discharge status: Home / Self Care | Payer: Medicare Other | Attending: Internal Medicine | Admitting: Internal Medicine

## 2017-09-23 ENCOUNTER — Encounter: Payer: Self-pay | Admitting: *Deleted

## 2017-09-23 ENCOUNTER — Emergency Department: Payer: Medicare Other

## 2017-09-23 DIAGNOSIS — K219 Gastro-esophageal reflux disease without esophagitis: Secondary | ICD-10-CM | POA: Diagnosis present

## 2017-09-23 DIAGNOSIS — Z8572 Personal history of non-Hodgkin lymphomas: Secondary | ICD-10-CM

## 2017-09-23 DIAGNOSIS — Z79899 Other long term (current) drug therapy: Secondary | ICD-10-CM | POA: Diagnosis not present

## 2017-09-23 DIAGNOSIS — R7303 Prediabetes: Secondary | ICD-10-CM | POA: Diagnosis present

## 2017-09-23 DIAGNOSIS — J440 Chronic obstructive pulmonary disease with acute lower respiratory infection: Secondary | ICD-10-CM | POA: Diagnosis present

## 2017-09-23 DIAGNOSIS — J441 Chronic obstructive pulmonary disease with (acute) exacerbation: Secondary | ICD-10-CM | POA: Diagnosis present

## 2017-09-23 DIAGNOSIS — Z86010 Personal history of colonic polyps: Secondary | ICD-10-CM | POA: Diagnosis not present

## 2017-09-23 DIAGNOSIS — M810 Age-related osteoporosis without current pathological fracture: Secondary | ICD-10-CM | POA: Diagnosis present

## 2017-09-23 DIAGNOSIS — J209 Acute bronchitis, unspecified: Principal | ICD-10-CM | POA: Diagnosis present

## 2017-09-23 DIAGNOSIS — I10 Essential (primary) hypertension: Secondary | ICD-10-CM | POA: Diagnosis present

## 2017-09-23 DIAGNOSIS — Z9049 Acquired absence of other specified parts of digestive tract: Secondary | ICD-10-CM | POA: Diagnosis not present

## 2017-09-23 DIAGNOSIS — J40 Bronchitis, not specified as acute or chronic: Secondary | ICD-10-CM | POA: Diagnosis not present

## 2017-09-23 DIAGNOSIS — Z85828 Personal history of other malignant neoplasm of skin: Secondary | ICD-10-CM | POA: Diagnosis not present

## 2017-09-23 DIAGNOSIS — Z87442 Personal history of urinary calculi: Secondary | ICD-10-CM

## 2017-09-23 DIAGNOSIS — Z7951 Long term (current) use of inhaled steroids: Secondary | ICD-10-CM | POA: Diagnosis not present

## 2017-09-23 DIAGNOSIS — R05 Cough: Secondary | ICD-10-CM | POA: Diagnosis not present

## 2017-09-23 DIAGNOSIS — Z90710 Acquired absence of both cervix and uterus: Secondary | ICD-10-CM | POA: Diagnosis not present

## 2017-09-23 DIAGNOSIS — C8590 Non-Hodgkin lymphoma, unspecified, unspecified site: Secondary | ICD-10-CM | POA: Diagnosis not present

## 2017-09-23 DIAGNOSIS — R0902 Hypoxemia: Secondary | ICD-10-CM

## 2017-09-23 DIAGNOSIS — E785 Hyperlipidemia, unspecified: Secondary | ICD-10-CM | POA: Diagnosis present

## 2017-09-23 DIAGNOSIS — J9601 Acute respiratory failure with hypoxia: Secondary | ICD-10-CM | POA: Diagnosis not present

## 2017-09-23 DIAGNOSIS — J9691 Respiratory failure, unspecified with hypoxia: Secondary | ICD-10-CM | POA: Diagnosis present

## 2017-09-23 HISTORY — DX: Non-Hodgkin lymphoma, unspecified, unspecified site: C85.90

## 2017-09-23 LAB — CBC WITH DIFFERENTIAL/PLATELET
BASOS ABS: 0 10*3/uL (ref 0–0.1)
BASOS PCT: 1 %
EOS PCT: 3 %
Eosinophils Absolute: 0.2 10*3/uL (ref 0–0.7)
HCT: 36.4 % (ref 35.0–47.0)
Hemoglobin: 12.7 g/dL (ref 12.0–16.0)
LYMPHS PCT: 15 %
Lymphs Abs: 1 10*3/uL (ref 1.0–3.6)
MCH: 32.9 pg (ref 26.0–34.0)
MCHC: 35 g/dL (ref 32.0–36.0)
MCV: 94 fL (ref 80.0–100.0)
MONO ABS: 0.9 10*3/uL (ref 0.2–0.9)
MONOS PCT: 13 %
NEUTROS ABS: 4.5 10*3/uL (ref 1.4–6.5)
Neutrophils Relative %: 68 %
PLATELETS: 173 10*3/uL (ref 150–440)
RBC: 3.87 MIL/uL (ref 3.80–5.20)
RDW: 13.5 % (ref 11.5–14.5)
WBC: 6.6 10*3/uL (ref 3.6–11.0)

## 2017-09-23 LAB — BASIC METABOLIC PANEL
Anion gap: 6 (ref 5–15)
BUN: 17 mg/dL (ref 6–20)
CALCIUM: 8.3 mg/dL — AB (ref 8.9–10.3)
CO2: 26 mmol/L (ref 22–32)
CREATININE: 1.15 mg/dL — AB (ref 0.44–1.00)
Chloride: 100 mmol/L — ABNORMAL LOW (ref 101–111)
GFR calc Af Amer: 52 mL/min — ABNORMAL LOW (ref >60)
GFR, EST NON AFRICAN AMERICAN: 45 mL/min — AB (ref >60)
Glucose, Bld: 110 mg/dL — ABNORMAL HIGH (ref 65–99)
Potassium: 3.5 mmol/L (ref 3.5–5.1)
Sodium: 132 mmol/L — ABNORMAL LOW (ref 135–145)

## 2017-09-23 LAB — BLOOD GAS, VENOUS
Acid-Base Excess: 3.1 mmol/L — ABNORMAL HIGH (ref 0.0–2.0)
Bicarbonate: 27 mmol/L (ref 20.0–28.0)
Patient temperature: 37
pCO2, Ven: 38 mmHg — ABNORMAL LOW (ref 44.0–60.0)
pH, Ven: 7.46 — ABNORMAL HIGH (ref 7.250–7.430)

## 2017-09-23 LAB — TROPONIN I: Troponin I: <0.03 ng/mL (ref <0.03)

## 2017-09-23 MED ORDER — VITAMIN D3 25 MCG (1000 UNIT) PO TABS
1000.0000 [IU] | ORAL_TABLET | Freq: Every day | ORAL | Status: DC
  Administered 2017-09-23: 1000 [IU] via ORAL
  Filled 2017-09-23 (×2): qty 1

## 2017-09-23 MED ORDER — AZITHROMYCIN 250 MG PO TABS: 250.0000 mg | ORAL_TABLET | Freq: Every day | ORAL | Status: DC

## 2017-09-23 MED ORDER — ESCITALOPRAM OXALATE 10 MG PO TABS
10.0000 mg | ORAL_TABLET | Freq: Every day | ORAL | Status: DC
  Administered 2017-09-23: 10 mg via ORAL
  Filled 2017-09-23: qty 1

## 2017-09-23 MED ORDER — LOSARTAN POTASSIUM 50 MG PO TABS
100.0000 mg | ORAL_TABLET | Freq: Every day | ORAL | Status: DC
  Administered 2017-09-23: 100 mg via ORAL
  Filled 2017-09-23: qty 2

## 2017-09-23 MED ORDER — METHYLPREDNISOLONE SODIUM SUCC 125 MG IJ SOLR
125.0000 mg | Freq: Once | INTRAMUSCULAR | Status: AC
  Administered 2017-09-23: 125 mg via INTRAVENOUS
  Filled 2017-09-23: qty 2

## 2017-09-23 MED ORDER — ALBUTEROL SULFATE HFA 108 (90 BASE) MCG/ACT IN AERS: 1.0000 | INHALATION_SPRAY | Freq: Four times a day (QID) | RESPIRATORY_TRACT | Status: DC | PRN

## 2017-09-23 MED ORDER — AZITHROMYCIN 250 MG PO TABS: ORAL_TABLET | ORAL | 0 refills | Status: DC

## 2017-09-23 MED ORDER — ONDANSETRON HCL 4 MG PO TABS: 4.0000 mg | ORAL_TABLET | Freq: Four times a day (QID) | ORAL | Status: DC | PRN

## 2017-09-23 MED ORDER — ACETAMINOPHEN 325 MG PO TABS: 650.0000 mg | ORAL_TABLET | Freq: Four times a day (QID) | ORAL | Status: DC | PRN

## 2017-09-23 MED ORDER — IPRATROPIUM-ALBUTEROL 0.5-2.5 (3) MG/3ML IN SOLN
3.0000 mL | Freq: Once | RESPIRATORY_TRACT | Status: AC
  Administered 2017-09-23: 3 mL via RESPIRATORY_TRACT
  Filled 2017-09-23: qty 3

## 2017-09-23 MED ORDER — MOMETASONE FURO-FORMOTEROL FUM 100-5 MCG/ACT IN AERO
2.0000 | INHALATION_SPRAY | Freq: Two times a day (BID) | RESPIRATORY_TRACT | Status: DC
  Administered 2017-09-23: 2 via RESPIRATORY_TRACT
  Filled 2017-09-23: qty 8.8

## 2017-09-23 MED ORDER — ENOXAPARIN SODIUM 40 MG/0.4ML ~~LOC~~ SOLN: 40.0000 mg | SUBCUTANEOUS | Status: DC

## 2017-09-23 MED ORDER — ACETAMINOPHEN 650 MG RE SUPP: 650.0000 mg | Freq: Four times a day (QID) | RECTAL | Status: DC | PRN

## 2017-09-23 MED ORDER — DOCUSATE SODIUM 100 MG PO CAPS
100.0000 mg | ORAL_CAPSULE | Freq: Every day | ORAL | Status: DC
  Administered 2017-09-23: 100 mg via ORAL
  Filled 2017-09-23: qty 1

## 2017-09-23 MED ORDER — METOPROLOL SUCCINATE ER 50 MG PO TB24
25.0000 mg | ORAL_TABLET | ORAL | Status: DC
  Administered 2017-09-23: 25 mg via ORAL
  Filled 2017-09-23: qty 1

## 2017-09-23 MED ORDER — MAGNESIUM SULFATE 2 GM/50ML IV SOLN
2.0000 g | Freq: Once | INTRAVENOUS | Status: AC
  Administered 2017-09-23: 2 g via INTRAVENOUS
  Filled 2017-09-23: qty 50

## 2017-09-23 MED ORDER — GUAIFENESIN 100 MG/5ML PO SOLN
5.0000 mL | ORAL | Status: DC | PRN
  Filled 2017-09-23: qty 5

## 2017-09-23 MED ORDER — SODIUM CHLORIDE 0.9 % IV SOLN
500.0000 mg | Freq: Once | INTRAVENOUS | Status: AC
  Administered 2017-09-23: 500 mg via INTRAVENOUS
  Filled 2017-09-23: qty 500

## 2017-09-23 MED ORDER — HYOSCYAMINE SULFATE ER 0.375 MG PO TB12
0.3750 mg | ORAL_TABLET | Freq: Every day | ORAL | Status: DC
  Administered 2017-09-23: 0.375 mg via ORAL
  Filled 2017-09-23 (×2): qty 1

## 2017-09-23 MED ORDER — ONDANSETRON HCL 4 MG/2ML IJ SOLN: 4.0000 mg | Freq: Four times a day (QID) | INTRAMUSCULAR | Status: DC | PRN

## 2017-09-23 MED ORDER — SODIUM CHLORIDE 0.9 % IV SOLN
Freq: Once | INTRAVENOUS | Status: AC
  Administered 2017-09-23: 10:00:00 via INTRAVENOUS

## 2017-09-23 MED ORDER — TRAMADOL HCL 50 MG PO TABS: 50.0000 mg | ORAL_TABLET | Freq: Four times a day (QID) | ORAL | Status: DC | PRN

## 2017-09-23 MED ORDER — ALBUTEROL SULFATE (2.5 MG/3ML) 0.083% IN NEBU: 2.5000 mg | INHALATION_SOLUTION | Freq: Four times a day (QID) | RESPIRATORY_TRACT | Status: DC | PRN

## 2017-09-23 MED ORDER — POTASSIUM CHLORIDE CRYS ER 20 MEQ PO TBCR
20.0000 meq | EXTENDED_RELEASE_TABLET | Freq: Every day | ORAL | Status: DC
  Administered 2017-09-23: 20 meq via ORAL
  Filled 2017-09-23: qty 1

## 2017-09-23 NOTE — ED Provider Notes (Signed)
[facility] Emergency Department Provider Note   ____________________________________________   First MD Initiated Contact with Patient 09/23/17 0309     (approximate)  I have reviewed the triage vital signs and the nursing notes.   HISTORY  Chief Complaint Cough    HPI Whitney Molina is a 77 y.o. female who comes into the hospital today stating that she has bronchitis.  She reports that the symptoms started about 4 to 5 days ago.  She went to her doctor's office and received an allergy shot 6 to 7 days ago and states that she started coughing a day or 2 later.  The patient's husband took her temperature yesterday and states it was 103.2.  She took some medicine to help to go down but in no time it was back up to 102.  She also had a lot of coughing over the night and some congestion.  The symptoms got worse to the weekend which is what brought him into the hospital tonight.  The patient states that she feels weak but does not feel significantly short of breath.  She had some cough medicine at home and took that tonight around 1230.  The patient could not sleep in the last time she was the sick she became very sick quickly.  The patient decided to come in for evaluation.  She denies any chest pain.   Past Medical History:  Diagnosis Date  . Basal cell carcinoma of nose   . Bronchitis   . GERD (gastroesophageal reflux disease)   . Heart murmur   . Hemorrhoids   . History of colon polyps   . History of kidney stones   . History of mammogram 2016  . Hyperlipidemia   . Hypertension   . 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 Prediabetes 02/19/2017  . COPD with chronic bronchitis ([place]) 01/23/2017  . Follicular lymphoma grade ii, intra-abdominal lymph nodes ([place]) 02/04/2016  . Back pain at L4-L5 level 01/11/2016  . Medicare annual wellness visit,  subsequent 09/05/2015  . Numbness of left hand 09/05/2015  . Lymphoma ([place]) 03/02/2015  . Shingles 02/22/2015  . Diverticulitis of colon 02/22/2015  . Abdominal lymphadenopathy 02/22/2015  . Renal cyst 02/22/2015  . Abdominal pain, left lower quadrant 02/16/2015  . Insomnia 12/25/2014  . Epistaxis 07/20/2014  . Hyperlipidemia LDL goal <130 06/26/2014  . Postmenopausal atrophic vaginitis 06/26/2014  . Essential hypertension 06/24/2014  . Osteoporosis 02/05/2014  . Bronchiectasis  09/15/2013  . History of colon polyps 10/24/2012    Past Surgical History:  Procedure Laterality Date  . ABDOMINAL HYSTERECTOMY  1984  . BLADDER SUSPENSION  2010  . CHOLECYSTECTOMY  06-01-03  . COLONOSCOPY  2003  . LITHOTRIPSY  2005  . local exicsion skin cancer  2004   squamous cell of chest wall. left chest  . TONSILLECTOMY  1971  . TUBAL LIGATION  1975  . VAGINAL PROLAPSE REPAIR  July 2001   Pelvic Prolapse    Prior to Admission medications   Medication Sig Start Date End Date Taking? Authorizing Provider  budesonide-formoterol (SYMBICORT) 80-4.5 MCG/ACT inhaler Inhale 2 puffs into the lungs 2 (two) times daily. 08/13/17   Flora Lipps, MD  Cholecalciferol (VITAMIN D3) 1000 units CAPS Take 1,000 Units by mouth daily.    [provider]  escitalopram (LEXAPRO) 10 MG tablet TAKE 1 TABLET BY MOUTH ONCE  DAILY 06/27/17   [name], MD  hyoscyamine (LEVBID) 0.375 MG 12 hr tablet TAKE 1 TABLET BY MOUTH ONCE DAILY 06/25/17   [name], MD  losartan (COZAAR) 100 MG tablet Take 1 tablet (100 mg total) by mouth daily. Needs appt with pcp 08/17/17   [name], MD  metoprolol succinate (TOPROL-XL) 25 MG 24 hr tablet TAKE ONE (1) TABLET BY MOUTH EVERY DAY 08/23/16   [name], MD  potassium chloride SA (K-DUR,KLOR-CON) 20 MEQ tablet TAKE 1 TABLET BY MOUTH ONCE DAILY 06/11/17   [name], MD  Probiotic Product (HEALTHY COLON PO) Take 1 capsule by mouth daily.    [provider]  simvastatin (ZOCOR) 40 MG tablet TAKE 1 TABLET EACH EVENING 03/05/17   [name], MD  triamterene-hydrochlorothiazide (MAXZIDE-25) 37.5-25 MG tablet Take 0.5 tablets by mouth daily. 04/17/17   [name], MD  VENTOLIN HFA 108 (90 Base) MCG/ACT inhaler Inhale 1 puff into the lungs every 6 (six) hours as needed for wheezing or shortness of breath. 08/13/17   Flora Lipps, MD    Allergies Patient has no known allergies.  Family History  Problem Relation Age of Onset  . Stroke Mother   . Breast cancer Mother 53  . Stroke Father   . Dementia Father   . Cancer Brother        bladder  . Stroke Brother   . Breast cancer Paternal Aunt 61  . Bipolar disorder Son     Social History Social History   Tobacco Use  . Smoking status: Never Smoker  . Smokeless tobacco: Never Used  Substance Use Topics  . Alcohol use: No  . Drug use: No    Review of Systems  Constitutional: No fever/chills Eyes: No visual changes. ENT: No sore throat. Cardiovascular: Denies chest pain. Respiratory: Cough and shortness of breath. Gastrointestinal: No abdominal pain.  No nausea, no vomiting.  No diarrhea.  No constipation. Genitourinary: Negative for dysuria. Musculoskeletal: Negative for back pain. Skin: Negative for rash. Neurological: Negative for headaches, focal weakness or numbness.   ____________________________________________   PHYSICAL EXAM:  VITAL SIGNS: ED Triage Vitals [09/23/17 0303]  Enc Vitals Group     BP 129/71     Pulse Rate 71     Resp 14     Temp 98.9 F (37.2 C)     Temp Source Oral     SpO2 98 %     Weight 149 lb (67.6 kg)     Height 5\' 2"  (1.575 m)     Head Circumference      Peak Flow      Pain Score 0     Pain Loc      Pain Edu?      Excl. in [place]?     Constitutional: Alert and oriented. Well appearing and in mild distress. Eyes: Conjunctivae are normal. PERRL. EOMI. Head: Atraumatic. Nose: No congestion/rhinnorhea. Mouth/Throat:  Mucous membranes are moist.  Oropharynx non-erythematous. Cardiovascular: Normal rate, regular rhythm. Grossly normal heart sounds.  Good peripheral circulation. Respiratory: Normal respiratory effort.  No retractions.  Mild expiratory wheezes. Gastrointestinal: Soft and nontender. No distention.  Positive bowel sounds Musculoskeletal: No lower extremity tenderness nor edema.   Neurologic:  Normal speech and language.  Skin:  Skin is warm, dry and intact.  Psychiatric: Mood and affect are normal.   ____________________________________________   LABS (all labs ordered are listed, but only abnormal results are displayed)  Labs Reviewed  BASIC METABOLIC PANEL - Abnormal; Notable for the following components:      Result Value   Sodium 132 (*)    Chloride 100 (*)    Glucose, Bld 110 (*)    Creatinine, Ser 1.15 (*)    Calcium 8.3 (*)    GFR calc non Af Amer 45 (*)    GFR calc Af Amer 52 (*)    All other components within normal limits  BLOOD GAS, VENOUS - Abnormal; Notable for the following components:   pH, Ven 7.46 (*)    pCO2, Ven 38 (*)    Acid-Base Excess 3.1 (*)    All other components within normal limits  CBC WITH DIFFERENTIAL/PLATELET  TROPONIN I   ____________________________________________  EKG  ED ECG REPORT I, Loney Hering, the attending physician, personally viewed and interpreted this ECG.   Date: 09/23/2017  EKG Time: 332  Rate: 67  Rhythm: normal sinus rhythm  Axis: left axis deviation  Intervals:none  ST&T Change: none  ____________________________________________  RADIOLOGY  ED MD interpretation:  CXR: No active cardiopulmonary disease  Official radiology report(s): Dg Chest 2 View  Result Date: 09/23/2017 CLINICAL DATA:  77 year old female with cough.  History of lymphoma. EXAM: CHEST - 2 VIEW COMPARISON:  PET CT dated 07/10/2017 FINDINGS: Mild diffuse chronic interstitial coarsening. No focal consolidation, pleural effusion, or  pneumothorax. The cardiac silhouette is within normal limits. No acute osseous pathology. Atherosclerotic calcification of the aorta. IMPRESSION: No active cardiopulmonary disease. Electronically Signed   By: Anner Crete M.D.   On: 09/23/2017 03:39    ____________________________________________   PROCEDURES  Procedure(s) performed: None  Procedures  Critical Care performed: No  ____________________________________________   INITIAL IMPRESSION / ASSESSMENT AND PLAN / ED COURSE  As part of my medical decision making, I reviewed the following data within the electronic MEDICAL RECORD NUMBER Notes from prior ED visits and [place] Controlled Substance Database   This is a 77 year old female who comes into the hospital today with some cough and shortness of breath.  The patient believes that she has bronchitis.  The patient has a history of COPD so my differential diagnosis includes COPD exacerbation, bronchitis, pneumonia, acute coronary syndrome.  Given the patient's history of non-Hodgkin's lymphoma and COPD I will check some blood work to evaluate the patient's white blood cell count.  She will receive a chest x-ray looking for pneumonia.  I will give the patient a DuoNeb treatment and she will be reassessed.  The patient developed some hypoxia down to 84% after receiving the Solu-Medrol and a DuoNeb.  We did place some O2 on the patient and she will receive another DuoNeb as well as some magnesium sulfate and azithromycin.  The patient will be admitted to the hospitalist service for bronchitis and hypoxia.  She does not have any pneumonia.      ____________________________________________   FINAL CLINICAL IMPRESSION(S) / ED DIAGNOSES  Final diagnoses:  Bronchitis  Hypoxia     ED Discharge Orders    None       Note:  This document was prepared using Dragon voice recognition software and may include unintentional dictation errors.    Loney Hering, MD 09/23/17  [phone]

## 2017-09-23 NOTE — Progress Notes (Signed)
Family Meeting Note  Advance Directive no  Today a meeting took place with the pt and husband    The following were discussed:Patient's diagnosis: patient is being admitted with acute bronchitis, fever and COPD mild exacerbation., Patient's progosis: good  status discussed with patient and husband. Patient is otherwise very active functional and wishes to be a full code.   Time spent during discussion: 16 mins Fritzi Mandes, MD

## 2017-09-23 NOTE — ED Triage Notes (Signed)
Pt states "I have bronchitis". Pt states cough and fever at home for several days. Pt states she is having green/yellow sputum production. Pt is able to speak in full sentences without difficulty.

## 2017-09-23 NOTE — ED Notes (Signed)
Resp notified of VBG sent to lab

## 2017-09-23 NOTE — Progress Notes (Signed)
Pt ambulated 320 feet in hallways with no difficulties, O2 saturations stayed >93%, returned to 99% at rest. Pt stable for d/c home today per MD. Reviewed discharge instructions and prescription with pt and husband, all questions answered. PIV removed, VSS. Pt assisted to car by RN.  [place], Jerry Caras

## 2017-09-23 NOTE — ED Notes (Signed)
Patient transported to X-ray 

## 2017-09-23 NOTE — H&P (Signed)
[facility] [place] NAME: Whitney Molina    MR#:  [id number]  DATE OF BIRTH:  03-14-1941  DATE OF ADMISSION:  09/23/2017  PRIMARY CARE PHYSICIAN: [name], MD   REQUESTING/REFERRING PHYSICIAN: Dr. Dahlia Client  CHIEF COMPLAINT:   Increasing cough and shortness of breath with fever of 102.3 HISTORY OF PRESENT ILLNESS:  Whitney Molina  is a 77 y.o. female with a known history of COPD, Neita Carp, non-Hodgkin's follicular lymphoma which is in remission comes to the emergency room with increasing cough for last several days and shortness of breath. Per the ER physician patient sets drop-down 84% in the ER. She is currently hundred percent on 2 L. She received IV Solu-Medrol 125 mg, Zithromax, breathing treatment. Patient states she feels a lot better than what she came in with. She is able to complete sentence without any shortness of breath or wheezing. Pt is being admitted with acute hypoxic respiratory failure secondary to COPD and acute bronchitis.  PAST MEDICAL HISTORY:   Past Medical History:  Diagnosis Date  . Basal cell carcinoma of nose   . Bronchitis   . GERD (gastroesophageal reflux disease)   . Heart murmur   . Hemorrhoids   . History of colon polyps   . History of kidney stones   . History of mammogram 2016  . Hyperlipidemia   . Hypertension   . 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 ABDOMINAL HYSTERECTOMY  1984  . BLADDER SUSPENSION  2010  . CHOLECYSTECTOMY  06-01-03  . COLONOSCOPY  2003  . LITHOTRIPSY  2005  . local exicsion skin cancer  2004   squamous cell of chest wall. left chest  . TONSILLECTOMY  1971  . TUBAL LIGATION  1975  . VAGINAL PROLAPSE REPAIR  July 2001   Pelvic Prolapse    SOCIAL HISTORY:   Social History   Tobacco Use  . Smoking status: Never Smoker   . Smokeless tobacco: Never Used  Substance Use Topics  . Alcohol use: No    FAMILY HISTORY:   Family History  Problem Relation Age of Onset  . Stroke Mother   . Breast cancer Mother 34  . Stroke Father   . Dementia Father   . Cancer Brother        bladder  . Stroke Brother   . Breast cancer Paternal Aunt 27  . Bipolar disorder Son     DRUG ALLERGIES:  No Known Allergies  REVIEW OF SYSTEMS:  Review of Systems  Constitutional: Negative for chills, fever and weight loss.  HENT: Negative for ear discharge, ear pain and nosebleeds.   Eyes: Negative for blurred vision, pain and discharge.  Respiratory: Positive for cough, sputum production and shortness of breath. Negative for wheezing and stridor.   Cardiovascular: Negative for chest pain, palpitations, orthopnea and PND.  Gastrointestinal: Negative for abdominal pain, diarrhea, nausea and vomiting.  Genitourinary: Negative for frequency and urgency.  Musculoskeletal: Negative for back pain and joint pain.  Neurological: Negative for sensory change, speech change, focal weakness and weakness.  Psychiatric/Behavioral: Negative for depression and hallucinations. The patient is not nervous/anxious.      MEDICATIONS AT HOME:   Prior to Admission medications   Medication Sig Start Date End Date Taking? Authorizing Provider  budesonide-formoterol (SYMBICORT) 80-4.5 MCG/ACT inhaler Inhale 2 puffs  into the lungs 2 (two) times daily. 08/13/17  Yes Flora Lipps, MD  Cholecalciferol (VITAMIN D3) 1000 units CAPS Take 1,000 Units by mouth daily.   Yes [provider]  docusate sodium (COLACE) 100 MG capsule Take 100 mg by mouth daily.   Yes [provider]  escitalopram (LEXAPRO) 10 MG tablet TAKE 1 TABLET BY MOUTH ONCE DAILY 06/27/17  Yes [name], MD  hyoscyamine (LEVBID) 0.375 MG 12 hr tablet TAKE 1 TABLET BY MOUTH ONCE DAILY 06/25/17  Yes [name], MD  losartan (COZAAR) 100 MG tablet Take 1 tablet (100 mg  total) by mouth daily. Needs appt with pcp 08/17/17  Yes [name], MD  metoprolol succinate (TOPROL-XL) 25 MG 24 hr tablet TAKE ONE (1) TABLET BY MOUTH EVERY DAY 08/23/16  Yes [name], MD  potassium chloride SA (K-DUR,KLOR-CON) 20 MEQ tablet TAKE 1 TABLET BY MOUTH ONCE DAILY 06/11/17  Yes [name], MD  Probiotic Product (HEALTHY COLON PO) Take 1 capsule by mouth daily.   Yes [provider]  simvastatin (ZOCOR) 40 MG tablet TAKE 1 TABLET EACH EVENING 03/05/17  Yes [name], MD  triamterene-hydrochlorothiazide (MAXZIDE-25) 37.5-25 MG tablet Take 0.5 tablets by mouth daily. 04/17/17  Yes [name], MD  VENTOLIN HFA 108 (90 Base) MCG/ACT inhaler Inhale 1 puff into the lungs every 6 (six) hours as needed for wheezing or shortness of breath. 08/13/17  Yes Flora Lipps, MD      VITAL SIGNS:  Blood pressure (!) 135/91, pulse 88, temperature 97.9 F (36.6 C), temperature source Oral, resp. rate 17, height 5\' 2"  (1.575 m), weight 67.6 kg (149 lb), SpO2 98 %.  PHYSICAL EXAMINATION:  GENERAL:  77 y.o.-year-old patient lying in the bed with no acute distress.  EYES: Pupils equal, round, reactive to light and accommodation. No scleral icterus. Extraocular muscles intact.  HEENT: Head atraumatic, normocephalic. Oropharynx and nasopharynx clear.  NECK:  Supple, no jugular venous distention. No thyroid enlargement, no tenderness.  LUNGS: Normal breath sounds bilaterally, no wheezing, rales,rhonchi or crepitation. No use of accessory muscles of respiration.  CARDIOVASCULAR: S1, S2 normal. No murmurs, rubs, or gallops.  ABDOMEN: Soft, nontender, nondistended. Bowel sounds present. No organomegaly or mass.  EXTREMITIES: No pedal edema, cyanosis, or clubbing.  NEUROLOGIC: Cranial nerves II through XII are intact. Muscle strength 5/5 in all extremities. Sensation intact. Gait not checked.  PSYCHIATRIC: The patient is alert and oriented x 3.  SKIN: No obvious rash, lesion, or  ulcer.   LABORATORY PANEL:   CBC Recent Labs  Lab 09/23/17 0327  WBC 6.6  HGB 12.7  HCT 36.4  PLT 173   ------------------------------------------------------------------------------------------------------------------  Chemistries  Recent Labs  Lab 09/23/17 0327  NA 132*  K 3.5  CL 100*  CO2 26  GLUCOSE 110*  BUN 17  CREATININE 1.15*  CALCIUM 8.3*   ------------------------------------------------------------------------------------------------------------------  Cardiac Enzymes Recent Labs  Lab 09/23/17 0327  TROPONINI <0.03   ------------------------------------------------------------------------------------------------------------------  RADIOLOGY:  Dg Chest 2 View  Result Date: 09/23/2017 CLINICAL DATA:  78 year old female with cough.  History of lymphoma. EXAM: CHEST - 2 VIEW COMPARISON:  PET CT dated 07/10/2017 FINDINGS: Mild diffuse chronic interstitial coarsening. No focal consolidation, pleural effusion, or pneumothorax. The cardiac silhouette is within normal limits. No acute osseous pathology. Atherosclerotic calcification of the aorta. IMPRESSION: No active cardiopulmonary disease. Electronically Signed   By: Anner Crete M.D.   On: 09/23/2017 03:39    EKG:  IMPRESSION AND PLAN:   Aditri Louischarles  is a 77 y.o. female with a known history of COPD, [name], Gerd, non-Hodgkin's follicular lymphoma which is in remission comes to the emergency room with increasing cough for last several days and shortness of breath.  1. acute hypoxic respiratory failure secondary to COPD exacerbation and bronchitis, acute -patient received a high dose of steroid 125 mg Solu-Medrol in the ER-she is currently really not wheezing. I will hold off on further prednisone. -Continue home inhalers and breathing treatment -wean to room air -Zithromax for four more days -chest x-ray no evidence of pneumonia  2. history of non-Hodgkin's lymphoma. Patient states she  is currently in remission not undergoing any chemo. She follows at the cancer center.  3. Hypertension continue losartan and metoprolol  4. DVT prophylaxis subcu Lovenox All the records are reviewed and case discussed with ED provider. Management plans discussed with the patient, family and they are in agreement.  CODE STATUS: full  TOTAL TIME TAKING CARE OF THIS PATIENT: *45* minutes.    Fritzi Mandes M.D on 09/23/2017 at 8:23 AM  Between 7am to 6pm - Pager - [phone]  After 6pm go to [email or web address] - password [email or web address]  [facility]  Office  [phone]  CC: Primary care physician; [name], MD

## 2017-09-23 NOTE — Discharge Summary (Signed)
Whitney Molina at [place] NAME: Whitney Molina    MR#:  [id number]  DATE OF BIRTH:  07/29/1940  DATE OF ADMISSION:  09/23/2017 ADMITTING PHYSICIAN: Whitney Mandes, MD  DATE OF DISCHARGE: 09/23/2017 PRIMARY CARE PHYSICIAN: Whitney Mc, MD    ADMISSION DIAGNOSIS:  Bronchitis [J40] Hypoxia [R09.02]  DISCHARGE DIAGNOSIS:  acute hypoxic respiratory failure secondary to COPD mild now resolved acute bronchitis  SECONDARY DIAGNOSIS:   Past Medical History:  Diagnosis Date  . Basal cell carcinoma of nose   . Bronchitis   . GERD (gastroesophageal reflux disease)   . Heart murmur   . Hemorrhoids   . History of colon polyps   . History of kidney stones   . History of mammogram 2016  . Hyperlipidemia   . Hypertension   . Non Hodgkin's lymphoma (Whitney Molina)   . 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 female with a known history of COPD, Whitney Molina, Gerd, non-Hodgkin's follicular lymphoma which is in remission comes to the emergency room with increasing cough for last several days and shortness of breath.  1. acute hypoxic respiratory failure secondary to COPD exacerbation and bronchitis, acute -patient received a high dose of steroid 125 mg Solu-Medrol in the ER-she is currently really not wheezing. I will hold off on further prednisone. -Continue home inhalers and breathing treatment -weaned to room air. Patient feels a lot better. She ambulated with nurses three loops around the nurses station. Stats remain 93% on room air. -Zithromax for four more days -chest x-ray no evidence of pneumonia  2. history of non-Hodgkin's lymphoma. Patient states she is currently in remission not undergoing any chemo. She follows at the cancer center.  3. Hypertension continue losartan and metoprolol  4. DVT prophylaxis subcu Lovenox  Overall improved. Spoke  with patient and husband. Patient feels well enough and questing to go home so she can enjoy rest of the Mother's Day. Hemodynamically otherwise stable. CONSULTS OBTAINED:  Treatment Team:  Whitney Silence, MD  DRUG ALLERGIES:  No Known Allergies  DISCHARGE MEDICATIONS:   Allergies as of 09/23/2017   No Known Allergies     Medication List    TAKE these medications   azithromycin 250 MG tablet Commonly known as:  ZITHROMAX Take as directed Start taking on:  09/24/2017   budesonide-formoterol 80-4.5 MCG/ACT inhaler Commonly known as:  SYMBICORT Inhale 2 puffs into the lungs 2 (two) times daily.   docusate sodium 100 MG capsule Commonly known as:  COLACE Take 100 mg by mouth daily.   escitalopram 10 MG tablet Commonly known as:  LEXAPRO TAKE 1 TABLET BY MOUTH ONCE DAILY   HEALTHY COLON PO Take 1 capsule by mouth daily.   hyoscyamine 0.375 MG 12 hr tablet Commonly known as:  LEVBID TAKE 1 TABLET BY MOUTH ONCE DAILY   losartan 100 MG tablet Commonly known as:  COZAAR Take 1 tablet (100 mg total) by mouth daily. Needs appt with pcp   metoprolol succinate 25 MG 24 hr tablet Commonly known as:  TOPROL-XL TAKE ONE (1) TABLET BY MOUTH EVERY DAY   potassium chloride SA 20 MEQ tablet Commonly known as:  K-DUR,KLOR-CON TAKE 1 TABLET BY MOUTH ONCE DAILY   simvastatin 40 MG tablet Commonly known as:  ZOCOR TAKE 1 TABLET EACH EVENING   triamterene-hydrochlorothiazide 37.5-25 MG tablet Commonly known as:  MAXZIDE-25 Take 0.5 tablets by mouth daily.   VENTOLIN HFA 108 (90 Base) MCG/ACT inhaler Generic drug:  albuterol Inhale 1 puff into the lungs every 6 (six) hours as needed for wheezing or shortness of breath.   Vitamin D3 1000 units Caps Take 1,000 Units by mouth daily.       If you experience worsening of your admission symptoms, develop shortness of breath, life threatening emergency, suicidal or homicidal thoughts you must seek medical attention  immediately by calling 911 or calling your MD immediately  if symptoms less severe.  You Must read complete instructions/literature along with all the possible adverse reactions/side effects for all the Medicines you take and that have been prescribed to you. Take any new Medicines after you have completely understood and accept all the possible adverse reactions/side effects.   Please note  You were cared for by a hospitalist during your hospital stay. If you have any questions about your discharge medications or the care you received while you were in the hospital after you are discharged, you can call the unit and asked to speak with the hospitalist on call if the hospitalist that took care of you is not available. Once you are discharged, your primary care physician will handle any further medical issues. Please note that NO REFILLS for any discharge medications will be authorized once you are discharged, as it is imperative that you return to your primary care physician (or establish a relationship with a primary care physician if you do not have one) for your aftercare needs so that they can reassess your need for medications and monitor your lab values. Today   SUBJECTIVE   Doing well. No shortness of breath. Some mild cough.  VITAL SIGNS:  Blood pressure (!) 135/91, pulse 88, temperature 97.9 F (36.6 C), temperature source Oral, resp. rate 17, height 5\' 2"  (1.575 m), weight 67.6 kg (149 lb), SpO2 99 %.  I/O:    Intake/Output Summary (Last 24 hours) at 09/23/2017 1247 Last data filed at 09/23/2017 1026 Gross per 24 hour  Intake 240 ml  Output -  Net 240 ml    PHYSICAL EXAMINATION:  GENERAL:  77 y.o.-year-old patient lying in the bed with no acute distress.  EYES: Pupils equal, round, reactive to light and accommodation. No scleral icterus. Extraocular muscles intact.  HEENT: Head atraumatic, normocephalic. Oropharynx and nasopharynx clear.  NECK:  Supple, no jugular venous  distention. No thyroid enlargement, no tenderness.  LUNGS: Normal breath sounds bilaterally, no wheezing, rales,rhonchi or crepitation. No use of accessory muscles of respiration.  CARDIOVASCULAR: S1, S2 normal. No murmurs, rubs, or gallops.  ABDOMEN: Soft, non-tender, non-distended. Bowel sounds present. No organomegaly or mass.  EXTREMITIES: No pedal edema, cyanosis, or clubbing.  NEUROLOGIC: Cranial nerves II through XII are intact. Muscle strength 5/5 in all extremities. Sensation intact. Gait not checked.  PSYCHIATRIC: The patient is alert and oriented x 3.  SKIN: No obvious rash, lesion, or ulcer.   DATA REVIEW:   CBC  Recent Labs  Lab 09/23/17 0327  WBC 6.6  HGB 12.7  HCT 36.4  PLT 173    Chemistries  Recent Labs  Lab 09/23/17 0327  NA 132*  K 3.5  CL 100*  CO2 26  GLUCOSE 110*  BUN 17  CREATININE 1.15*  CALCIUM 8.3*    Microbiology Results   No results found for this or any previous visit (from the past 240 hour(s)).  RADIOLOGY:  Dg Chest 2 View  Result Date: 09/23/2017  CLINICAL DATA:  77 year old female with cough.  History of lymphoma. EXAM: CHEST - 2 VIEW COMPARISON:  PET CT dated 07/10/2017 FINDINGS: Mild diffuse chronic interstitial coarsening. No focal consolidation, pleural effusion, or pneumothorax. The cardiac silhouette is within normal limits. No acute osseous pathology. Atherosclerotic calcification of the aorta. IMPRESSION: No active cardiopulmonary disease. Electronically Signed   By: Anner Crete M.D.   On: 09/23/2017 03:39     Management plans discussed with the patient, family and they are in agreement.  CODE STATUS:     Code Status Orders  (From admission, onward)        Start     Ordered   09/23/17 0805  Full code  Continuous     09/23/17 0804    Code Status History    Date Active Date Inactive Code Status Order ID Comments User Context   03/24/2015 1023 03/25/2015 0328 Full Code [id number]  Jacqulynn Cadet, MD [facility]       TOTAL TIME TAKING CARE OF THIS PATIENT: *40* minutes.    Whitney Molina M.D on 09/23/2017 at 12:47 PM  Between 7am to 6pm - Pager - [phone] After 6pm go to [email or web address] - password [email or web address]  Office  [phone]  CC: Primary care physician; Whitney Mc, MD

## 2017-09-23 NOTE — ED Notes (Signed)
Pt placed on 2L of oxygen due to oxygen saturation 84-86%, EDP aware.

## 2017-09-23 NOTE — Progress Notes (Signed)
Pt admitted this morning to room 150 from the ED. Pt is A&Ox4, pt denies SOB. Oxygen saturation on room air 99%. Pt is ambulating around room with no difficulties. Pt oriented to room and plan of care. Her husband is at bedside.   [place], Jerry Caras

## 2017-09-24 ENCOUNTER — Other Ambulatory Visit: Payer: Self-pay | Admitting: Internal Medicine

## 2017-09-24 ENCOUNTER — Telehealth: Payer: Self-pay | Admitting: *Deleted

## 2017-09-24 NOTE — Telephone Encounter (Signed)
Transition Care Management Follow-up Telephone Call  How have you been since you were released from the hospital? Patient states she is feeling and breathing better.   Do you understand why you were in the hospital? yes   Do you understand the discharge instrcutions? yes  Items Reviewed:  Medications reviewed: yes  Allergies reviewed: yes  Dietary changes reviewed: yes  Referrals reviewed: yes   Functional Questionnaire:   Activities of Daily Living (ADLs):   She states they are independent in the following: ambulation, bathing and hygiene, feeding, continence, grooming, toileting and dressing States they require assistance with the following: No assistance needed.   Any transportation issues/concerns?: no   Any patient concerns? no   Confirmed importance and date/time of follow-up visits scheduled: yes   Confirmed with patient if condition begins to worsen call PCP or go to the ER.  Patient was given the Call-a-Nurse line [phone]: yes

## 2017-09-24 NOTE — Telephone Encounter (Signed)
First attempt made for TCM call no answer and no voicemail on cell or home phone.

## 2017-09-27 ENCOUNTER — Encounter: Payer: Self-pay | Admitting: Internal Medicine

## 2017-09-27 ENCOUNTER — Ambulatory Visit (INDEPENDENT_AMBULATORY_CARE_PROVIDER_SITE_OTHER): Payer: Medicare Other | Admitting: Internal Medicine

## 2017-09-27 VITALS — BP 120/86 | HR 59 | Temp 97.6°F | Resp 15 | Ht 62.0 in | Wt 148.8 lb

## 2017-09-27 DIAGNOSIS — J309 Allergic rhinitis, unspecified: Secondary | ICD-10-CM

## 2017-09-27 DIAGNOSIS — J4489 Other specified chronic obstructive pulmonary disease: Secondary | ICD-10-CM

## 2017-09-27 DIAGNOSIS — R590 Localized enlarged lymph nodes: Secondary | ICD-10-CM

## 2017-09-27 DIAGNOSIS — Z09 Encounter for follow-up examination after completed treatment for conditions other than malignant neoplasm: Secondary | ICD-10-CM

## 2017-09-27 DIAGNOSIS — J449 Chronic obstructive pulmonary disease, unspecified: Secondary | ICD-10-CM

## 2017-09-27 MED ORDER — MONTELUKAST SODIUM 10 MG PO TABS: 10.0000 mg | ORAL_TABLET | Freq: Every day | ORAL | 3 refills | Status: DC

## 2017-09-27 MED ORDER — PREDNISONE 10 MG PO TABS
ORAL_TABLET | ORAL | 0 refills | Status: DC
Start: 2017-09-27 — End: 2018-01-10

## 2017-09-27 MED ORDER — HYDROCOD POLST-CPM POLST ER 10-8 MG/5ML PO SUER: 5.0000 mL | Freq: Every evening | ORAL | 0 refills | Status: DC | PRN

## 2017-09-27 MED ORDER — ESCITALOPRAM OXALATE 10 MG PO TABS: 10.0000 mg | ORAL_TABLET | Freq: Every day | ORAL | 1 refills | Status: DC

## 2017-09-27 NOTE — Progress Notes (Signed)
Subjective:  Patient ID: Whitney Molina, female    DOB: 03/29/1941  Age: 77 y.o. MRN: [id number]  CC: The primary encounter diagnosis was Abdominal lymphadenopathy. Diagnoses of COPD with chronic bronchitis [facility]), Hospital discharge follow-up, and Allergic rhinitis, unspecified seasonality, unspecified trigger were also pertinent to this visit.  HPI Whitney Molina presents for hospital follow up  Patient was admitted to [facility] on May 12 with acute hypoxic respiratory failure with room air saturations reportedly dropping into the 20's   Patient states that her breathing problems began in January.  she had several recurrent vs unresolved respiratory infections, was treated in January with a  Z pack and Tussionex.  Treated for viral URI in Feb bth by RB .    She receives weekly  allergy injections at ENT office  Chronically for years. by ENT to manage envirnonmental allergies to cats  [place],  Helix,  That was causing a dry cough .      She was awake and alert during ER evaluation so se was not intubated.  She was  given IV steroids, supplemental O2  Azithromycin  and nebs.  Ambulatory sats improved to 93% on room air and chest x ray was clear.  She was discharged home same day on azithromycin (Mother's Day)   Since discharge,  Taking azithromycin,  No prednisone taper!!   Using symbicort  AND DULERA    Sputum was  tinged with blood several times  Since discharge but none today       Outpatient Medications Prior to Visit  Medication Sig Dispense Refill  . budesonide-formoterol (SYMBICORT) 80-4.5 MCG/ACT inhaler Inhale 2 puffs into the lungs 2 (two) times daily. 1 Inhaler 11  . Cholecalciferol (VITAMIN D3) 1000 units CAPS Take 1,000 Units by mouth daily.    Marland Kitchen docusate sodium (COLACE) 100 MG capsule Take 100 mg by mouth daily.    . hyoscyamine (LEVBID) 0.375 MG 12 hr tablet TAKE 1 TABLET BY MOUTH ONCE DAILY 90 tablet 1  . losartan (COZAAR) 100 MG tablet Take 1 tablet (100 mg  total) by mouth daily. Needs appt with pcp 30 tablet 0  . metoprolol succinate (TOPROL-XL) 25 MG 24 hr tablet TAKE 1 TABLET BY MOUTH ONCE DAILY 90 tablet 3  . potassium chloride SA (K-DUR,KLOR-CON) 20 MEQ tablet TAKE 1 TABLET BY MOUTH ONCE DAILY 90 tablet 0  . Probiotic Product (HEALTHY COLON PO) Take 1 capsule by mouth daily.    . simvastatin (ZOCOR) 40 MG tablet TAKE 1 TABLET EACH EVENING 90 tablet 1  . triamterene-hydrochlorothiazide (MAXZIDE-25) 37.5-25 MG tablet Take 0.5 tablets by mouth daily. 90 tablet 3  . VENTOLIN HFA 108 (90 Base) MCG/ACT inhaler Inhale 1 puff into the lungs every 6 (six) hours as needed for wheezing or shortness of breath. 1 Inhaler 6  . escitalopram (LEXAPRO) 10 MG tablet TAKE 1 TABLET BY MOUTH ONCE DAILY 90 tablet 1  . azithromycin (ZITHROMAX) 250 MG tablet Take as directed (Patient not taking: Reported on 09/27/2017) 4 each 0   No facility-administered medications prior to visit.     Review of Systems;  Patient denies headache, fevers, malaise, unintentional weight loss, skin rash, eye pain, sinus congestion and sinus pain, sore throat, dysphagia,  hemoptysis , cough, dyspnea, wheezing, chest pain, palpitations, orthopnea, edema, abdominal pain, nausea, melena, diarrhea, constipation, flank pain, dysuria, hematuria, urinary  Frequency, nocturia, numbness, tingling, seizures,  Focal weakness, Loss of consciousness,  Tremor, insomnia, depression, anxiety, and suicidal ideation.  Objective:  BP 120/86 (BP Location: Left Arm, Patient Position: Sitting, Cuff Size: Normal)   Pulse (!) 59   Temp 97.6 F (36.4 C) (Oral)   Resp 15   Ht 5\' 2"  (1.575 m)   Wt 148 lb 12.8 oz (67.5 kg)   SpO2 98%   BMI 27.22 kg/m   BP Readings from Last 3 Encounters:  09/27/17 120/86  09/23/17 (!) 156/81  09/10/17 138/80    Wt Readings from Last 3 Encounters:  09/27/17 148 lb 12.8 oz (67.5 kg)  09/23/17 149 lb (67.6 kg)  09/10/17 152 lb 12.8 oz (69.3 kg)    General  appearance: alert, cooperative and appears stated age Ears: normal TM's and external ear canals both ears Throat: lips, mucosa, and tongue normal; teeth and gums normal Neck: no adenopathy, no carotid bruit, supple, symmetrical, trachea midline and thyroid not enlarged, symmetric, no tenderness/mass/nodules Back: symmetric, no curvature. ROM normal. No CVA tenderness. Lungs: clear to auscultation bilaterally Heart: regular rate and rhythm, S1, S2 normal, no murmur, click, rub or gallop Abdomen: soft, non-tender; bowel sounds normal; no masses,  no organomegaly Pulses: 2+ and symmetric Skin: Skin color, texture, turgor normal. No rashes or lesions Lymph nodes: Cervical, supraclavicular, and axillary nodes normal.  Lab Results  Component Value Date   HGBA1C 6.0 02/19/2017   HGBA1C 5.9 01/10/2016   HGBA1C 5.9 09/03/2015    Lab Results  Component Value Date   CREATININE 1.15 (H) 09/23/2017   CREATININE 1.14 (H) 07/10/2017   CREATININE 1.15 (H) 01/10/2017    Lab Results  Component Value Date   WBC 6.6 09/23/2017   HGB 12.7 09/23/2017   HCT 36.4 09/23/2017   PLT 173 09/23/2017   GLUCOSE 110 (H) 09/23/2017   CHOL 171 02/19/2017   TRIG 117.0 02/19/2017   HDL 40.00 02/19/2017   LDLDIRECT 103 01/10/2016   LDLCALC 108 (H) 02/19/2017   ALT 19 07/10/2017   AST 23 07/10/2017   NA 132 (L) 09/23/2017   K 3.5 09/23/2017   CL 100 (L) 09/23/2017   CREATININE 1.15 (H) 09/23/2017   BUN 17 09/23/2017   CO2 26 09/23/2017   TSH 1.29 09/03/2015   INR 1.02 03/24/2015   HGBA1C 6.0 02/19/2017    Dg Chest 2 View  Result Date: 09/23/2017 CLINICAL DATA:  77 year old female with cough.  History of lymphoma. EXAM: CHEST - 2 VIEW COMPARISON:  PET CT dated 07/10/2017 FINDINGS: Mild diffuse chronic interstitial coarsening. No focal consolidation, pleural effusion, or pneumothorax. The cardiac silhouette is within normal limits. No acute osseous pathology. Atherosclerotic calcification of the aorta.  IMPRESSION: No active cardiopulmonary disease. Electronically Signed   By: Anner Crete M.D.   On: 09/23/2017 03:39    Assessment & Plan:   Problem List Items Addressed This Visit    Hospital discharge follow-up    Patient is stable post discharge and has no new issues or questions about discharge plans at the visit today for hospital follow up. All labs , imaging studies and progress notes from admission were reviewed with patient today        COPD with chronic bronchitis ([place])    With recent brief (< 24 Hor) admission for acute hypoxic respiratory faillure. Adding a prednisone , as it appears that both episodes in the [place], plus the hospital  discharge instrcutioncs did not include steroid  For unclear reasons.      Relevant Medications   predniSONE (DELTASONE) 10 MG tablet   montelukast (SINGULAIR) 10 MG tablet  chlorpheniramine-HYDROcodone (TUSSIONEX PENNKINETIC ER) 10-8 MG/5ML SUER   Allergic rhinitis    Previously managed with weekly allergy immunotherapy,  Stopped.adding singulair to zyrtec today       RESOLVED: Abdominal lymphadenopathy - Primary      I have discontinued Whitney Molina's azithromycin. I have also changed her escitalopram. Additionally, I am having her start on predniSONE, montelukast, and chlorpheniramine-HYDROcodone. Lastly, I am having her maintain her Vitamin D3, Probiotic Product (HEALTHY COLON PO), simvastatin, triamterene-hydrochlorothiazide, potassium chloride SA, hyoscyamine, budesonide-formoterol, VENTOLIN HFA, losartan, docusate sodium, and metoprolol succinate.  Meds ordered this encounter  Medications  . predniSONE (DELTASONE) 10 MG tablet    Sig: 6 tablets on Day 1 , then reduce by 1 tablet daily until gone    Dispense:  21 tablet    Refill:  0  . montelukast (SINGULAIR) 10 MG tablet    Sig: Take 1 tablet (10 mg total) by mouth at bedtime.    Dispense:  30 tablet    Refill:  3  . chlorpheniramine-HYDROcodone (TUSSIONEX  PENNKINETIC ER) 10-8 MG/5ML SUER    Sig: Take 5 mLs by mouth at bedtime as needed for cough.    Dispense:  140 mL    Refill:  0  . escitalopram (LEXAPRO) 10 MG tablet    Sig: Take 1 tablet (10 mg total) by mouth daily.    Dispense:  90 tablet    Refill:  1    Medications Discontinued During This Encounter  Medication Reason  . azithromycin (ZITHROMAX) 250 MG tablet Completed Course  . escitalopram (LEXAPRO) 10 MG tablet Reorder    Follow-up: No follow-ups on file.   [name], MD

## 2017-09-27 NOTE — Patient Instructions (Addendum)
I am adding a 6 day prednisone taper   You do not need to use the symbicort AND the [facility].    Use the [facility]  2 puffs twice daily   I recommend you see Dr Mortimer Fries sooner than October to have your PFTs repeated   Continue Zyrtec   Adding  Singulair   Stop the simvastatin for 3-4 weeks.  Resume with Daily co Q 10.  If cramps return,  Please let me know

## 2017-09-29 DIAGNOSIS — Z09 Encounter for follow-up examination after completed treatment for conditions other than malignant neoplasm: Secondary | ICD-10-CM | POA: Insufficient documentation

## 2017-09-29 DIAGNOSIS — J309 Allergic rhinitis, unspecified: Secondary | ICD-10-CM | POA: Insufficient documentation

## 2017-09-29 NOTE — Assessment & Plan Note (Signed)
With recent brief (< 24 Hor) admission for acute hypoxic respiratory faillure. Adding a prednisone , as it appears that both episodes in the [place], plus the hospital  discharge instrcutioncs did not include steroid  For unclear reasons.

## 2017-09-29 NOTE — Assessment & Plan Note (Addendum)
Previously managed with weekly allergy immunotherapy,  Stopped.adding singulair to zyrtec today

## 2017-09-29 NOTE — Assessment & Plan Note (Signed)
Patient is stable post discharge and has no new issues or questions about discharge plans at the visit today for hospital follow up. All labs , imaging studies and progress notes from admission were reviewed with patient today   

## 2017-10-09 ENCOUNTER — Other Ambulatory Visit: Payer: Self-pay | Admitting: Internal Medicine

## 2017-10-09 NOTE — Telephone Encounter (Signed)
Copied from [place] [id number]. Topic: Quick Communication - Rx Refill/Question >> Oct 09, 2017  8:30 AM Scherrie Gerlach wrote: Medication: chlorpheniramine-HYDROcodone (TUSSIONEX PENNKINETIC ER) 10-8 MG/5ML SUER   Pt states she saw Dr Derrel Nip a couple of weeks ago and she advised the pt just to call if she needed a refill of this med.  Pt states she gets to coughing and this helps her to rest. Dr Demetrios Isaacs is aware [place], [place] - [place]. [phone] (Phone) [phone] (Fax)

## 2017-10-10 NOTE — Telephone Encounter (Signed)
Refilled

## 2017-10-11 NOTE — Telephone Encounter (Signed)
Patient staed she will try some OTC cough medication and if this does not work will call for appointment.

## 2017-10-11 NOTE — Telephone Encounter (Signed)
Not sure how this ended up back in my basket. Pt would like to see about getting a refill on the tussinex. She stated she feels better as far as the cold but she still has the lingering cough that she can't control at times.

## 2017-10-11 NOTE — Telephone Encounter (Signed)
Reviewed pts history.  Given her history and if persistent symptoms, needs to be evaluated.  Just had tussionex on 09/27/17.

## 2017-10-12 ENCOUNTER — Other Ambulatory Visit: Payer: Self-pay

## 2017-10-12 ENCOUNTER — Emergency Department: Payer: Medicare Other

## 2017-10-12 ENCOUNTER — Emergency Department
Admission: EM | Admit: 2017-10-12 | Discharge: 2017-10-12 | Disposition: A | Discharge status: Home / Self Care | Payer: Medicare Other | Attending: Emergency Medicine | Admitting: Emergency Medicine

## 2017-10-12 ENCOUNTER — Encounter: Payer: Self-pay | Admitting: Emergency Medicine

## 2017-10-12 DIAGNOSIS — S4992XA Unspecified injury of left shoulder and upper arm, initial encounter: Secondary | ICD-10-CM | POA: Diagnosis not present

## 2017-10-12 DIAGNOSIS — Y998 Other external cause status: Secondary | ICD-10-CM | POA: Insufficient documentation

## 2017-10-12 DIAGNOSIS — I10 Essential (primary) hypertension: Secondary | ICD-10-CM | POA: Diagnosis not present

## 2017-10-12 DIAGNOSIS — S52502A Unspecified fracture of the lower end of left radius, initial encounter for closed fracture: Secondary | ICD-10-CM | POA: Diagnosis not present

## 2017-10-12 DIAGNOSIS — Y929 Unspecified place or not applicable: Secondary | ICD-10-CM | POA: Diagnosis not present

## 2017-10-12 DIAGNOSIS — Z8572 Personal history of non-Hodgkin lymphomas: Secondary | ICD-10-CM | POA: Insufficient documentation

## 2017-10-12 DIAGNOSIS — S62102A Fracture of unspecified carpal bone, left wrist, initial encounter for closed fracture: Secondary | ICD-10-CM | POA: Insufficient documentation

## 2017-10-12 DIAGNOSIS — M19042 Primary osteoarthritis, left hand: Secondary | ICD-10-CM | POA: Diagnosis not present

## 2017-10-12 DIAGNOSIS — Y9389 Activity, other specified: Secondary | ICD-10-CM | POA: Insufficient documentation

## 2017-10-12 DIAGNOSIS — J301 Allergic rhinitis due to pollen: Secondary | ICD-10-CM | POA: Diagnosis not present

## 2017-10-12 DIAGNOSIS — M25512 Pain in left shoulder: Secondary | ICD-10-CM | POA: Diagnosis not present

## 2017-10-12 DIAGNOSIS — W010XXA Fall on same level from slipping, tripping and stumbling without subsequent striking against object, initial encounter: Secondary | ICD-10-CM | POA: Diagnosis not present

## 2017-10-12 DIAGNOSIS — Z79899 Other long term (current) drug therapy: Secondary | ICD-10-CM | POA: Insufficient documentation

## 2017-10-12 DIAGNOSIS — S40012A Contusion of left shoulder, initial encounter: Secondary | ICD-10-CM | POA: Diagnosis not present

## 2017-10-12 DIAGNOSIS — J449 Chronic obstructive pulmonary disease, unspecified: Secondary | ICD-10-CM | POA: Insufficient documentation

## 2017-10-12 DIAGNOSIS — S6992XA Unspecified injury of left wrist, hand and finger(s), initial encounter: Secondary | ICD-10-CM | POA: Diagnosis present

## 2017-10-12 DIAGNOSIS — W19XXXA Unspecified fall, initial encounter: Secondary | ICD-10-CM

## 2017-10-12 MED ORDER — TRAMADOL HCL 50 MG PO TABS: 50.0000 mg | ORAL_TABLET | Freq: Two times a day (BID) | ORAL | 0 refills | Status: DC | PRN

## 2017-10-12 MED ORDER — TRAMADOL HCL 50 MG PO TABS
50.0000 mg | ORAL_TABLET | Freq: Once | ORAL | Status: AC
Start: 2017-10-12 — End: 2017-10-12
  Administered 2017-10-12: 50 mg via ORAL
  Filled 2017-10-12: qty 1

## 2017-10-12 MED ORDER — IBUPROFEN 600 MG PO TABS: 600.0000 mg | ORAL_TABLET | Freq: Three times a day (TID) | ORAL | 0 refills | Status: DC | PRN

## 2017-10-12 NOTE — Discharge Instructions (Addendum)
Wear splint and follow discharge care instructions until seen by orthopedic clinic.

## 2017-10-12 NOTE — ED Triage Notes (Signed)
Pt fell and landed on her left shoulder. Pt c/o left hand/wrist pain and left shoulder pain. Denies LOC.

## 2017-10-12 NOTE — ED Notes (Signed)
See triage note  Presents with pain to left hand and shoulder   States she fell this am  Landed on left side

## 2017-10-12 NOTE — ED Provider Notes (Signed)
[facility] Emergency Department Provider Note   ____________________________________________   First MD Initiated Contact with Patient 10/12/17 1036     (approximate)  I have reviewed the triage vital signs and the nursing notes.   HISTORY  Chief Complaint Fall and Wrist Pain    HPI Whitney Molina is a 77 y.o. female patient complain of left shoulder and left wrist pain secondary to a trip and fall.  Patient denies loss of sensation or loss of function of the affected upper extremity.  Patient points to the distal radius and a lateral humerus as a source of pain.  Patient rates the pain as a 7/10.  Patient described the pain is "ache".  Ice pack applied to left wrist at triage.   Past Medical History:  Diagnosis Date  . Basal cell carcinoma of nose   . Bronchitis   . GERD (gastroesophageal reflux disease)   . Heart murmur   . Hemorrhoids   . History of colon polyps   . History of kidney stones   . History of mammogram 2016  . Hyperlipidemia   . Hypertension   . Non Hodgkin's lymphoma ([place])   . 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 Hospital discharge follow-up 09/29/2017  . Allergic rhinitis 09/29/2017  . Respiratory failure with hypoxia ([place]) 09/23/2017  . COPD with acute bronchitis ([place]) 09/23/2017  . Prediabetes 02/19/2017  . COPD with chronic bronchitis ([place]) 01/23/2017  . Follicular lymphoma grade ii, intra-abdominal lymph nodes ([place]) 02/04/2016  . Back pain at L4-L5 level 01/11/2016  . Medicare annual wellness visit, subsequent 09/05/2015  . Numbness of left hand 09/05/2015  . Lymphoma ([place]) 03/02/2015  . Shingles 02/22/2015  . Diverticulitis of colon 02/22/2015  . Renal cyst 02/22/2015  . Insomnia 12/25/2014  . Epistaxis 07/20/2014  . Hyperlipidemia LDL goal <130 06/26/2014  . Postmenopausal atrophic vaginitis 06/26/2014  .  Essential hypertension 06/24/2014  . Osteoporosis 02/05/2014  . Bronchiectasis  09/15/2013  . History of colon polyps 10/24/2012    Past Surgical History:  Procedure Laterality Date  . ABDOMINAL HYSTERECTOMY  1984  . BLADDER SUSPENSION  2010  . CHOLECYSTECTOMY  06-01-03  . COLONOSCOPY  2003  . LITHOTRIPSY  2005  . local exicsion skin cancer  2004   squamous cell of chest wall. left chest  . TONSILLECTOMY  1971  . TUBAL LIGATION  1975  . VAGINAL PROLAPSE REPAIR  July 2001   Pelvic Prolapse    Prior to Admission medications   Medication Sig Start Date End Date Taking? Authorizing Provider  budesonide-formoterol (SYMBICORT) 80-4.5 MCG/ACT inhaler Inhale 2 puffs into the lungs 2 (two) times daily. 08/13/17   Flora Lipps, MD  chlorpheniramine-HYDROcodone (TUSSIONEX PENNKINETIC ER) 10-8 MG/5ML SUER Take 5 mLs by mouth at bedtime as needed for cough. 09/27/17   [name], MD  Cholecalciferol (VITAMIN D3) 1000 units CAPS Take 1,000 Units by mouth daily.    [provider]  docusate sodium (COLACE) 100 MG capsule Take 100 mg by mouth daily.    [provider]  escitalopram (LEXAPRO) 10 MG tablet Take 1 tablet (10 mg total) by mouth daily. 09/27/17   [name], MD  hyoscyamine (LEVBID) 0.375 MG 12 hr tablet TAKE 1 TABLET BY MOUTH ONCE DAILY 06/25/17   [name], MD  ibuprofen (ADVIL,MOTRIN) 600 MG tablet Take 1 tablet (  600 mg total) by mouth every 8 (eight) hours as needed. 10/12/17   Sable Feil, PA-C  losartan (COZAAR) 100 MG tablet Take 1 tablet (100 mg total) by mouth daily. Needs appt with pcp 08/17/17   [name], MD  metoprolol succinate (TOPROL-XL) 25 MG 24 hr tablet TAKE 1 TABLET BY MOUTH ONCE DAILY 09/24/17   [name], MD  montelukast (SINGULAIR) 10 MG tablet Take 1 tablet (10 mg total) by mouth at bedtime. 09/27/17   [name], MD  potassium chloride SA (K-DUR,KLOR-CON) 20 MEQ tablet TAKE 1 TABLET BY MOUTH ONCE DAILY 06/11/17   [name], MD  predniSONE (DELTASONE) 10 MG tablet 6 tablets on Day 1 , then reduce by 1 tablet daily until gone 09/27/17   [name], MD  Probiotic Product (HEALTHY COLON PO) Take 1 capsule by mouth daily.    [provider]  simvastatin (ZOCOR) 40 MG tablet TAKE 1 TABLET EACH EVENING 03/05/17   [name], MD  traMADol (ULTRAM) 50 MG tablet Take 1 tablet (50 mg total) by mouth every 12 (twelve) hours as needed. 10/12/17   Sable Feil, PA-C  triamterene-hydrochlorothiazide (MAXZIDE-25) 37.5-25 MG tablet Take 0.5 tablets by mouth daily. 04/17/17   [name], MD  VENTOLIN HFA 108 (90 Base) MCG/ACT inhaler Inhale 1 puff into the lungs every 6 (six) hours as needed for wheezing or shortness of breath. 08/13/17   Flora Lipps, MD    Allergies Patient has no known allergies.  Family History  Problem Relation Age of Onset  . Stroke Mother   . Breast cancer Mother 67  . Stroke Father   . Dementia Father   . Cancer Brother        bladder  . Stroke Brother   . Breast cancer Paternal Aunt 25  . Bipolar disorder Son     Social History Social History   Tobacco Use  . Smoking status: Never Smoker  . Smokeless tobacco: Never Used  Substance Use Topics  . Alcohol use: No  . Drug use: No    Review of Systems Constitutional: No fever/chills Eyes: No visual changes. ENT: No sore throat. Cardiovascular: Denies chest pain. Respiratory: Denies shortness of breath. Gastrointestinal: No abdominal pain.  No nausea, no vomiting.  No diarrhea.  No constipation. Genitourinary: Negative for dysuria. Musculoskeletal: Left shoulder and wrist pain. Skin: Negative for rash. Neurological: Negative for headaches, focal weakness or numbness. Endocrine:Hyperlipidemia hypertension   ____________________________________________   PHYSICAL EXAM:  VITAL SIGNS: ED Triage Vitals  Enc Vitals Group     BP 10/12/17 1034 (!) 143/80     Pulse Rate 10/12/17 1034 77     Resp  10/12/17 1034 16     Temp 10/12/17 1034 98.4 F (36.9 C)     Temp Source 10/12/17 1034 Oral     SpO2 10/12/17 1034 98 %     Weight 10/12/17 1023 147 lb (66.7 kg)     Height 10/12/17 1023 5\' 3"  (1.6 m)     Head Circumference --      Peak Flow --      Pain Score 10/12/17 1023 7     Pain Loc --      Pain Edu? --      Excl. in [place]? --     Constitutional: Alert and oriented. Well appearing and in no acute distress. Nose: No congestion/rhinnorhea. Mouth/Throat: Mucous membranes are moist.  Oropharynx non-erythematous. Neck:  No cervical spine  tenderness to palpation. Cardiovascular: Normal rate, regular rhythm. Grossly normal heart sounds.  Good peripheral circulation. Respiratory: Normal respiratory effort.  No retractions. Lungs CTAB. Musculoskeletal: No obvious deformity to the left shoulder left wrist.  Patient has moderate guarding palpation at the distal radius.  Patient also moderate guarding palpation at the humeral head. Neurologic:  Normal speech and language. No gross focal neurologic deficits are appreciated. No gait instability. Skin:  Skin is warm, dry and intact. No rash noted. Psychiatric: Mood and affect are normal. Speech and behavior are normal.  ____________________________________________   LABS (all labs ordered are listed, but only abnormal results are displayed)  Labs Reviewed - No data to display ____________________________________________  EKG   ____________________________________________  RADIOLOGY    Official radiology report(s): Dg Shoulder Left  Result Date: 10/12/2017 CLINICAL DATA:  Fall.  Pain left shoulder. EXAM: LEFT SHOULDER - 2+ VIEW COMPARISON:  No recent prior. FINDINGS: Acromioclavicular glenohumeral degenerative change. No evidence of fracture dislocation. IMPRESSION: 1.  Acromioclavicular glenohumeral degenerative change. 2.  No acute abnormality. Electronically Signed   By: Marcello Moores  Register   On: 10/12/2017 11:01   Dg Hand Complete  Left  Result Date: 10/12/2017 EXAM: LEFT HAND - COMPLETE 3+ VIEW COMPARISON:  No recent. FINDINGS: Soft tissue swelling is noted about the wrist. What appears to be a present along the distal aspect of the left radius. Slight displacement. Diffuse degenerative change. Tiny bony densities noted adjacent to the interphalangeal joints most likely degenerative. IMPRESSION: 1. What appears to be a fracture of the distal radius is present. Left wrist series can be obtained further evaluation. 2.  Diffuse degenerative change.  The left hand is otherwise intact. Electronically Signed   By: Marcello Moores  Register   On: 10/12/2017 11:03    ____________________________________________   PROCEDURES  Procedure(s) performed:   Procedures  Critical Care performed: No  ____________________________________________   INITIAL IMPRESSION / ASSESSMENT AND PLAN / ED COURSE  As part of my medical decision making, I reviewed the following data within the [place]    Patient presents with pain to the left shoulder and left wrist secondary to fall.  X-rays were consistent with a distal radial fracture which is nondisplaced.  Discussed x-ray findings with patient.  Patient placed in a Velcro wrist splint.  Patient given discharge care instructions.  Patient advised take medication as directed follow-up with orthopedics by calling for an appointment.      ____________________________________________   FINAL CLINICAL IMPRESSION(S) / ED DIAGNOSES  Final diagnoses:  Left wrist fracture, closed, initial encounter  Contusion of left shoulder, initial encounter     ED Discharge Orders        Ordered    traMADol (ULTRAM) 50 MG tablet  Every 12 hours PRN     10/12/17 1115    ibuprofen (ADVIL,MOTRIN) 600 MG tablet  Every 8 hours PRN     10/12/17 1115       Note:  This document was prepared using Dragon voice recognition software and may include unintentional dictation errors.    Sable Feil, PA-C 10/12/17 1121    Harvest Dark, MD 10/12/17 1137

## 2017-10-16 DIAGNOSIS — M25512 Pain in left shoulder: Secondary | ICD-10-CM | POA: Diagnosis not present

## 2017-10-16 DIAGNOSIS — S52572A Other intraarticular fracture of lower end of left radius, initial encounter for closed fracture: Secondary | ICD-10-CM | POA: Diagnosis not present

## 2017-10-16 DIAGNOSIS — S6992XA Unspecified injury of left wrist, hand and finger(s), initial encounter: Secondary | ICD-10-CM | POA: Diagnosis not present

## 2017-10-16 DIAGNOSIS — W010XXA Fall on same level from slipping, tripping and stumbling without subsequent striking against object, initial encounter: Secondary | ICD-10-CM | POA: Diagnosis not present

## 2017-10-23 DIAGNOSIS — S6992XD Unspecified injury of left wrist, hand and finger(s), subsequent encounter: Secondary | ICD-10-CM | POA: Diagnosis not present

## 2017-10-23 DIAGNOSIS — S52572D Other intraarticular fracture of lower end of left radius, subsequent encounter for closed fracture with routine healing: Secondary | ICD-10-CM | POA: Diagnosis not present

## 2017-11-06 DIAGNOSIS — S52572D Other intraarticular fracture of lower end of left radius, subsequent encounter for closed fracture with routine healing: Secondary | ICD-10-CM | POA: Diagnosis not present

## 2017-11-26 DIAGNOSIS — M62838 Other muscle spasm: Secondary | ICD-10-CM | POA: Diagnosis not present

## 2017-11-26 DIAGNOSIS — S52572D Other intraarticular fracture of lower end of left radius, subsequent encounter for closed fracture with routine healing: Secondary | ICD-10-CM | POA: Diagnosis not present

## 2017-11-26 DIAGNOSIS — W19XXXD Unspecified fall, subsequent encounter: Secondary | ICD-10-CM | POA: Diagnosis not present

## 2017-11-27 ENCOUNTER — Other Ambulatory Visit: Payer: Self-pay | Admitting: Internal Medicine

## 2017-12-24 DIAGNOSIS — S52572D Other intraarticular fracture of lower end of left radius, subsequent encounter for closed fracture with routine healing: Secondary | ICD-10-CM | POA: Diagnosis not present

## 2017-12-24 DIAGNOSIS — W010XXD Fall on same level from slipping, tripping and stumbling without subsequent striking against object, subsequent encounter: Secondary | ICD-10-CM | POA: Diagnosis not present

## 2018-01-10 ENCOUNTER — Other Ambulatory Visit: Payer: Self-pay

## 2018-01-10 ENCOUNTER — Inpatient Hospital Stay (HOSPITAL_BASED_OUTPATIENT_CLINIC_OR_DEPARTMENT_OTHER): Payer: Medicare Other | Admitting: Internal Medicine

## 2018-01-10 ENCOUNTER — Encounter: Payer: Self-pay | Admitting: Internal Medicine

## 2018-01-10 ENCOUNTER — Inpatient Hospital Stay: Payer: Medicare Other | Attending: Internal Medicine

## 2018-01-10 VITALS — BP 141/98 | HR 72 | Temp 98.1°F | Resp 20 | Ht 63.0 in | Wt 150.0 lb

## 2018-01-10 DIAGNOSIS — E785 Hyperlipidemia, unspecified: Secondary | ICD-10-CM

## 2018-01-10 DIAGNOSIS — I10 Essential (primary) hypertension: Secondary | ICD-10-CM | POA: Insufficient documentation

## 2018-01-10 DIAGNOSIS — Z85828 Personal history of other malignant neoplasm of skin: Secondary | ICD-10-CM | POA: Diagnosis not present

## 2018-01-10 DIAGNOSIS — N281 Cyst of kidney, acquired: Secondary | ICD-10-CM | POA: Insufficient documentation

## 2018-01-10 DIAGNOSIS — Z86010 Personal history of colonic polyps: Secondary | ICD-10-CM | POA: Insufficient documentation

## 2018-01-10 DIAGNOSIS — Z8052 Family history of malignant neoplasm of bladder: Secondary | ICD-10-CM

## 2018-01-10 DIAGNOSIS — Z803 Family history of malignant neoplasm of breast: Secondary | ICD-10-CM

## 2018-01-10 DIAGNOSIS — R011 Cardiac murmur, unspecified: Secondary | ICD-10-CM | POA: Diagnosis not present

## 2018-01-10 DIAGNOSIS — Z79899 Other long term (current) drug therapy: Secondary | ICD-10-CM

## 2018-01-10 DIAGNOSIS — K219 Gastro-esophageal reflux disease without esophagitis: Secondary | ICD-10-CM | POA: Diagnosis not present

## 2018-01-10 DIAGNOSIS — C8213 Follicular lymphoma grade II, intra-abdominal lymph nodes: Secondary | ICD-10-CM | POA: Insufficient documentation

## 2018-01-10 DIAGNOSIS — Z87442 Personal history of urinary calculi: Secondary | ICD-10-CM

## 2018-01-10 LAB — CBC WITH DIFFERENTIAL/PLATELET
BASOS ABS: 0 10*3/uL (ref 0–0.1)
BASOS PCT: 1 %
Eosinophils Absolute: 0.2 10*3/uL (ref 0–0.7)
Eosinophils Relative: 3 %
HEMATOCRIT: 37.8 % (ref 35.0–47.0)
HEMOGLOBIN: 13.1 g/dL (ref 12.0–16.0)
LYMPHS PCT: 29 %
Lymphs Abs: 1.6 10*3/uL (ref 1.0–3.6)
MCH: 32.7 pg (ref 26.0–34.0)
MCHC: 34.6 g/dL (ref 32.0–36.0)
MCV: 94.5 fL (ref 80.0–100.0)
MONO ABS: 0.4 10*3/uL (ref 0.2–0.9)
Monocytes Relative: 8 %
NEUTROS ABS: 3.2 10*3/uL (ref 1.4–6.5)
NEUTROS PCT: 59 %
Platelets: 218 10*3/uL (ref 150–440)
RBC: 4 MIL/uL (ref 3.80–5.20)
RDW: 13.3 % (ref 11.5–14.5)
WBC: 5.5 10*3/uL (ref 3.6–11.0)

## 2018-01-10 LAB — COMPREHENSIVE METABOLIC PANEL
ALBUMIN: 4.1 g/dL (ref 3.5–5.0)
ALT: 18 U/L (ref 0–44)
ANION GAP: 8 (ref 5–15)
AST: 23 U/L (ref 15–41)
Alkaline Phosphatase: 65 U/L (ref 38–126)
BUN: 16 mg/dL (ref 8–23)
CALCIUM: 9 mg/dL (ref 8.9–10.3)
CHLORIDE: 104 mmol/L (ref 98–111)
CO2: 26 mmol/L (ref 22–32)
Creatinine, Ser: 1.06 mg/dL — ABNORMAL HIGH (ref 0.44–1.00)
GFR calc Af Amer: 58 mL/min — ABNORMAL LOW (ref >60)
GFR calc non Af Amer: 50 mL/min — ABNORMAL LOW (ref >60)
GLUCOSE: 105 mg/dL — AB (ref 70–99)
POTASSIUM: 3.6 mmol/L (ref 3.5–5.1)
SODIUM: 138 mmol/L (ref 135–145)
TOTAL PROTEIN: 7.4 g/dL (ref 6.5–8.1)
Total Bilirubin: 0.9 mg/dL (ref 0.3–1.2)

## 2018-01-10 LAB — LACTATE DEHYDROGENASE: LDH: 157 U/L (ref 98–192)

## 2018-01-10 NOTE — Assessment & Plan Note (Addendum)
#   RETROPERITONEAL LN [incidental/asymptomatic]- follicular lymphoma grade 2; likely stage II; status post rituximab. FEB 2019 PET scan NED.  Stable.  # Clinically no evidence of recurrence. Discussed re: scans- HOLD off.   # Large stable benign renal cyst normal kidney function. No recommendations at this time [previously followed by Dr.wolfe]  # follow up in 6 months/labs; no scans.   Cc: Dr.Tullo

## 2018-01-10 NOTE — Progress Notes (Signed)
[place] OFFICE PROGRESS NOTE  Patient Care Team: [name], MD as PCP - General (Internal Medicine) Bary Castilla Forest Gleason, MD (General Surgery)  Cancer Staging No matching staging information was found for the patient.   Oncology History    # NOV 1610- FOLLICULAR LYMPHOMA G-2; [s/p Bx- RETROPERITONEAL/LEFT Peri-aortic LN ~ 2.5CM [incidental on CT scan in 2016; CT- Nov 2014- ~1CM]/ PET 2016-Left Peri-aortic LN; Suv ~15; Ext Iliac LN 6 mm; Feb 2017- Unchanged LN; START Rituxan q week x4 [finished march 15th]; PET JAN 30th 2018- NED.   # Hx of Shingles [Sep 9604]-      Follicular lymphoma grade ii, intra-abdominal lymph nodes ([place])      INTERVAL HISTORY:  Whitney Molina 77 y.o.  female pleasant patient above history of stage II follicular lymphoma-grade 2 is here for follow-up.  Patient denies any abdominal pain.  Denies any nausea vomiting.  Denies any night sweats or weight loss or new lumps or bumps.  Patient had a recent mechanical fall had a wrist fracture currently healed.  Review of Systems  Constitutional: Negative for chills, diaphoresis, fever, malaise/fatigue and weight loss.  HENT: Negative for nosebleeds and sore throat.   Eyes: Negative for double vision.  Respiratory: Negative for cough, hemoptysis, sputum production, shortness of breath and wheezing.   Cardiovascular: Negative for chest pain, palpitations, orthopnea and leg swelling.  Gastrointestinal: Negative for abdominal pain, blood in stool, constipation, diarrhea, heartburn, melena, nausea and vomiting.  Genitourinary: Negative for dysuria, frequency and urgency.  Musculoskeletal: Negative for back pain and joint pain.  Skin: Negative.  Negative for itching and rash.  Neurological: Negative for dizziness, tingling, focal weakness, weakness and headaches.  Endo/Heme/Allergies: Does not bruise/bleed easily.  Psychiatric/Behavioral: Negative for depression. The patient is not  nervous/anxious and does not have insomnia.       PAST MEDICAL HISTORY :  Past Medical History:  Diagnosis Date  . Basal cell carcinoma of nose   . Bronchitis   . GERD (gastroesophageal reflux disease)   . Heart murmur   . Hemorrhoids   . History of colon polyps   . History of kidney stones   . History of mammogram 2016  . Hyperlipidemia   . Hypertension   . Non Hodgkin's lymphoma ([place])   . 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 ABDOMINAL HYSTERECTOMY  1984  . BLADDER SUSPENSION  2010  . CHOLECYSTECTOMY  06-01-03  . COLONOSCOPY  2003  . LITHOTRIPSY  2005  . local exicsion skin cancer  2004   squamous cell of chest wall. left chest  . TONSILLECTOMY  1971  . TUBAL LIGATION  1975  . VAGINAL PROLAPSE REPAIR  July 2001   Pelvic Prolapse    FAMILY HISTORY :   Family History  Problem Relation Age of Onset  . Stroke Mother   . Breast cancer Mother 46  . Stroke Father   . Dementia Father   . Cancer Brother        bladder  . Stroke Brother   . Breast cancer Paternal Aunt 62  . Bipolar disorder Son     SOCIAL HISTORY:   Social History   Tobacco Use  . Smoking status: Never Smoker  . Smokeless tobacco: Never Used  Substance Use Topics  . Alcohol use: No  . Drug use: No  ALLERGIES:  has No Known Allergies.  MEDICATIONS:  Current Outpatient Medications  Medication Sig Dispense Refill  . budesonide-formoterol (SYMBICORT) 80-4.5 MCG/ACT inhaler Inhale 2 puffs into the lungs 2 (two) times daily. 1 Inhaler 11  . Cholecalciferol (VITAMIN D3) 1000 units CAPS Take 1,000 Units by mouth daily.    Marland Kitchen docusate sodium (COLACE) 100 MG capsule Take 100 mg by mouth daily.    Marland Kitchen escitalopram (LEXAPRO) 10 MG tablet Take 1 tablet (10 mg total) by mouth daily. 90 tablet 1  . hyoscyamine (LEVBID) 0.375 MG 12 hr tablet TAKE 1 TABLET BY MOUTH ONCE DAILY 90 tablet 1   . ibuprofen (ADVIL,MOTRIN) 600 MG tablet Take 1 tablet (600 mg total) by mouth every 8 (eight) hours as needed. 15 tablet 0  . losartan (COZAAR) 100 MG tablet TAKE 1 TABLET BY MOUTH ONCE DAILY NEEDS AN APPOINTMENT 90 tablet 1  . metoprolol succinate (TOPROL-XL) 25 MG 24 hr tablet TAKE 1 TABLET BY MOUTH ONCE DAILY 90 tablet 3  . montelukast (SINGULAIR) 10 MG tablet Take 1 tablet (10 mg total) by mouth at bedtime. 30 tablet 3  . potassium chloride SA (K-DUR,KLOR-CON) 20 MEQ tablet TAKE 1 TABLET BY MOUTH ONCE DAILY 90 tablet 1  . Probiotic Product (HEALTHY COLON PO) Take 1 capsule by mouth daily.    . simvastatin (ZOCOR) 40 MG tablet TAKE 1 TABLET EACH EVENING 90 tablet 1  . triamterene-hydrochlorothiazide (MAXZIDE-25) 37.5-25 MG tablet Take 0.5 tablets by mouth daily. 90 tablet 3  . VENTOLIN HFA 108 (90 Base) MCG/ACT inhaler Inhale 1 puff into the lungs every 6 (six) hours as needed for wheezing or shortness of breath. 1 Inhaler 6   No current facility-administered medications for this visit.     PHYSICAL EXAMINATION: ECOG PERFORMANCE STATUS: 0 - Asymptomatic  BP (!) 141/98 (Patient Position: Sitting)   Pulse 72   Temp 98.1 F (36.7 C) (Oral)   Resp 20   Ht 5\' 3"  (1.6 m)   Wt 150 lb (68 kg)   BMI 26.57 kg/m   Filed Weights   01/10/18 1107  Weight: 150 lb (68 kg)    Physical Exam  Constitutional: She is oriented to person, place, and time and well-developed, well-nourished, and in no distress.  Accompanied by her husband.  Walking myself.  HENT:  Head: Normocephalic and atraumatic.  Mouth/Throat: Oropharynx is clear and moist. No oropharyngeal exudate.  Eyes: Pupils are equal, round, and reactive to light.  Neck: Normal range of motion. Neck supple.  Cardiovascular: Normal rate and regular rhythm.  Pulmonary/Chest: No respiratory distress. She has no wheezes.  Abdominal: Soft. Bowel sounds are normal. She exhibits no distension and no mass. There is no tenderness. There is no  rebound and no guarding.  Musculoskeletal: Normal range of motion. She exhibits no edema or tenderness.  Neurological: She is alert and oriented to person, place, and time.  Skin: Skin is warm.  Psychiatric: Affect normal.       LABORATORY DATA:  I have reviewed the data as listed    Component Value Date/Time   NA 138 01/10/2018 1022   NA 138 09/10/2013 0758   K 3.6 01/10/2018 1022   K 3.3 (L) 09/10/2013 0758   CL 104 01/10/2018 1022   CL 100 09/10/2013 0758   CO2 26 01/10/2018 1022   CO2 32 09/10/2013 0758   GLUCOSE 105 (H) 01/10/2018 1022   GLUCOSE 85 09/10/2013 0758   BUN 16 01/10/2018 1022   BUN 13 09/10/2013 0758  CREATININE 1.06 (H) 01/10/2018 1022   CREATININE 0.96 12/22/2013 0857   CALCIUM 9.0 01/10/2018 1022   CALCIUM 9.2 09/10/2013 0758   PROT 7.4 01/10/2018 1022   PROT 8.3 (H) 09/10/2013 0758   ALBUMIN 4.1 01/10/2018 1022   ALBUMIN 3.5 09/10/2013 0758   AST 23 01/10/2018 1022   AST 23 09/10/2013 0758   ALT 18 01/10/2018 1022   ALT 28 09/10/2013 0758   ALKPHOS 65 01/10/2018 1022   ALKPHOS 78 09/10/2013 0758   BILITOT 0.9 01/10/2018 1022   BILITOT 0.6 09/10/2013 0758   GFRNONAA 50 (L) 01/10/2018 1022   GFRNONAA 59 (L) 12/22/2013 0857   GFRAA 58 (L) 01/10/2018 1022   GFRAA 68 12/22/2013 0857    No results found for: SPEP, UPEP  Lab Results  Component Value Date   WBC 5.5 01/10/2018   NEUTROABS 3.2 01/10/2018   HGB 13.1 01/10/2018   HCT 37.8 01/10/2018   MCV 94.5 01/10/2018   PLT 218 01/10/2018      Chemistry      Component Value Date/Time   NA 138 01/10/2018 1022   NA 138 09/10/2013 0758   K 3.6 01/10/2018 1022   K 3.3 (L) 09/10/2013 0758   CL 104 01/10/2018 1022   CL 100 09/10/2013 0758   CO2 26 01/10/2018 1022   CO2 32 09/10/2013 0758   BUN 16 01/10/2018 1022   BUN 13 09/10/2013 0758   CREATININE 1.06 (H) 01/10/2018 1022   CREATININE 0.96 12/22/2013 0857      Component Value Date/Time   CALCIUM 9.0 01/10/2018 1022   CALCIUM 9.2  09/10/2013 0758   ALKPHOS 65 01/10/2018 1022   ALKPHOS 78 09/10/2013 0758   AST 23 01/10/2018 1022   AST 23 09/10/2013 0758   ALT 18 01/10/2018 1022   ALT 28 09/10/2013 0758   BILITOT 0.9 01/10/2018 1022   BILITOT 0.6 09/10/2013 0758       RADIOGRAPHIC STUDIES: I have personally reviewed the radiological images as listed and agreed with the findings in the report. No results found.   ASSESSMENT & PLAN:  Follicular lymphoma grade ii, intra-abdominal lymph nodes ([place]) # RETROPERITONEAL LN [incidental/asymptomatic]- follicular lymphoma grade 2; likely stage II; status post rituximab. FEB 2019 PET scan NED.  Stable.  # Clinically no evidence of recurrence. Discussed re: scans- HOLD off.   # Large stable benign renal cyst normal kidney function. No recommendations at this time [previously followed by Dr.wolfe]  # follow up in 6 months/labs; no scans.   Cc: Dr.Tullo   Orders Placed This Encounter  Procedures  . CBC with Differential    Standing Status:   Future    Standing Expiration Date:   01/11/2019  . Comprehensive metabolic panel    Standing Status:   Future    Standing Expiration Date:   01/11/2019  . Lactate dehydrogenase    Standing Status:   Future    Standing Expiration Date:   01/11/2019   All questions were answered. The patient knows to call the clinic with any problems, questions or concerns.      Cammie Sickle, MD 01/10/2018 1:46 PM

## 2018-02-01 ENCOUNTER — Other Ambulatory Visit: Payer: Self-pay | Admitting: Internal Medicine

## 2018-02-12 DIAGNOSIS — D1800 Hemangioma unspecified site: Secondary | ICD-10-CM | POA: Diagnosis not present

## 2018-02-12 DIAGNOSIS — D229 Melanocytic nevi, unspecified: Secondary | ICD-10-CM | POA: Diagnosis not present

## 2018-02-12 DIAGNOSIS — Z1283 Encounter for screening for malignant neoplasm of skin: Secondary | ICD-10-CM | POA: Diagnosis not present

## 2018-02-12 DIAGNOSIS — L578 Other skin changes due to chronic exposure to nonionizing radiation: Secondary | ICD-10-CM | POA: Diagnosis not present

## 2018-02-12 DIAGNOSIS — L820 Inflamed seborrheic keratosis: Secondary | ICD-10-CM | POA: Diagnosis not present

## 2018-02-12 DIAGNOSIS — L812 Freckles: Secondary | ICD-10-CM | POA: Diagnosis not present

## 2018-02-12 DIAGNOSIS — L308 Other specified dermatitis: Secondary | ICD-10-CM | POA: Diagnosis not present

## 2018-02-12 DIAGNOSIS — Z85828 Personal history of other malignant neoplasm of skin: Secondary | ICD-10-CM | POA: Diagnosis not present

## 2018-02-12 DIAGNOSIS — L570 Actinic keratosis: Secondary | ICD-10-CM | POA: Diagnosis not present

## 2018-02-12 DIAGNOSIS — D485 Neoplasm of uncertain behavior of skin: Secondary | ICD-10-CM | POA: Diagnosis not present

## 2018-02-12 DIAGNOSIS — L821 Other seborrheic keratosis: Secondary | ICD-10-CM | POA: Diagnosis not present

## 2018-03-06 ENCOUNTER — Ambulatory Visit: Payer: Medicare Other | Admitting: Internal Medicine

## 2018-03-08 ENCOUNTER — Encounter: Payer: Self-pay | Admitting: Internal Medicine

## 2018-03-08 ENCOUNTER — Ambulatory Visit (INDEPENDENT_AMBULATORY_CARE_PROVIDER_SITE_OTHER): Payer: Medicare Other | Admitting: Internal Medicine

## 2018-03-08 VITALS — BP 118/74 | HR 71 | Ht 63.5 in | Wt 149.8 lb

## 2018-03-08 DIAGNOSIS — J449 Chronic obstructive pulmonary disease, unspecified: Secondary | ICD-10-CM | POA: Diagnosis not present

## 2018-03-08 DIAGNOSIS — Z23 Encounter for immunization: Secondary | ICD-10-CM

## 2018-03-08 NOTE — Patient Instructions (Addendum)
Continue ALBUTEROL as needed for cough and wheezing  AVOID ALL TRIGGERS   FLU SHOT TODAY

## 2018-03-08 NOTE — Progress Notes (Signed)
Name: DENAISHA SWANGO MRN: [id number] DOB: 1940/06/11     CONSULTATION DATE: 03/08/2018  REFERRING MD : Vidal Schwalbe  CHIEF COMPLAINT:  Follow up COPD  STUDIES:  CXR 01/02/17 Interpretation: No acute opacifications no effusions hyperinflation of the lungs     HISTORY OF PRESENT ILLNESS:   Follow up for COPD Mild intermittent SOB and DOE No wheezing No cough at this time  No signs of infection at this time No signs of exacerbation at this time  Triggers include cats, dust, pollen  She uses albuterol daily but have told her that she needs to use it as needed  Patient had viral bronchitis in May 2019 With ER Visit  She has stopped allergy shots and feels much better    Review of Systems:  Gen:  Denies  fever, sweats, chills weigh loss  HEENT: Denies blurred vision, double vision, ear pain, eye pain, hearing loss, nose bleeds, sore throat Cardiac:  No dizziness, chest pain or heaviness, chest tightness,edema, No JVD Resp:   +cough +sputum production, +shortness of breath,+wheezing, -hemoptysis,  Gi: Denies swallowing difficulty, stomach pain, nausea or vomiting, diarrhea, constipation, bowel incontinence Gu:  Denies bladder incontinence, burning urine Ext:   Denies Joint pain, stiffness or swelling Skin: Denies  skin rash, easy bruising or bleeding or hives Endoc:  Denies polyuria, polydipsia , polyphagia or weight change Psych:   Denies depression, insomnia or hallucinations  Other:  All other systems negative     Ht 5' 3.5" (1.613 m)   Wt 149 lb 12.8 oz (67.9 kg)   BMI 26.12 kg/m  BP 118/74 (BP Location: Left Arm, Cuff Size: Normal)   Pulse 71   Ht 5' 3.5" (1.613 m)   Wt 149 lb 12.8 oz (67.9 kg)   SpO2 97%   BMI 26.12 kg/m    Physical Examination:   GENERAL:NAD, no fevers, chills, no weakness no fatigue HEAD: Normocephalic, atraumatic.  EYES: Pupils equal, round, reactive to light. Extraocular muscles intact. No scleral icterus.  MOUTH: Moist  mucosal membrane. Dentition intact. No abscess noted.  EAR, NOSE, THROAT: Clear without exudates. No external lesions.  NECK: Supple. No thyromegaly. No nodules. No JVD.  PULMONARY: CTA B/L no wheezing, rhonchi, crackles CARDIOVASCULAR: S1 and S2. Regular rate and rhythm. No murmurs, rubs, or gallops. No edema. Pedal pulses 2+ bilaterally.  GASTROINTESTINAL: Soft, nontender, nondistended. No masses. Positive bowel sounds. No hepatosplenomegaly.  MUSCULOSKELETAL: No swelling, clubbing, or edema. Range of motion full in all extremities.  NEUROLOGIC: Cranial nerves II through XII are intact. No gross focal neurological deficits. Sensation intact. Reflexes intact.  SKIN: No ulceration, lesions, rashes, or cyanosis. Skin warm and dry. Turgor intact.  PSYCHIATRIC: Mood, affect within normal limits. The patient is awake, alert and oriented x 3. Insight, judgment intact.  ALL OTHER ROS ARE NEGATIVE      ASSESSMENT / PLAN:  77 year old pleasant white female seen today for COPD mild gold stage a Previous office spirometry showed a ratio of 64% predicted with FEV1 of 84% predicted   #1 chronic cough related to COPD  Stable Albuterol as needed  #2 mild COPD No signs of exacerbation at this time No signs of infection at this time Use albuterol as needed Avoid all triggers  #3 Deconditioned state -Recommend increased daily activity and exercise   No further recommendations at this time  FLu shot to be given today  Patient/Family are satisfied with Plan of action and management. All questions answered Follow up in 1 year  Corrin Parker, M.D.  [facility]  Medical Director [place] Director [facility] Cardio-Pulmonary Department

## 2018-04-01 ENCOUNTER — Ambulatory Visit (INDEPENDENT_AMBULATORY_CARE_PROVIDER_SITE_OTHER): Payer: Medicare Other | Admitting: Internal Medicine

## 2018-04-01 ENCOUNTER — Encounter: Payer: Self-pay | Admitting: Internal Medicine

## 2018-04-01 ENCOUNTER — Other Ambulatory Visit: Payer: Self-pay | Admitting: Internal Medicine

## 2018-04-01 VITALS — BP 128/84 | HR 59 | Temp 98.1°F | Resp 15 | Ht 63.5 in | Wt 145.8 lb

## 2018-04-01 DIAGNOSIS — E785 Hyperlipidemia, unspecified: Secondary | ICD-10-CM | POA: Diagnosis not present

## 2018-04-01 DIAGNOSIS — Z1211 Encounter for screening for malignant neoplasm of colon: Secondary | ICD-10-CM

## 2018-04-01 DIAGNOSIS — I10 Essential (primary) hypertension: Secondary | ICD-10-CM

## 2018-04-01 DIAGNOSIS — R7303 Prediabetes: Secondary | ICD-10-CM | POA: Diagnosis not present

## 2018-04-01 DIAGNOSIS — Z1239 Encounter for other screening for malignant neoplasm of breast: Secondary | ICD-10-CM | POA: Diagnosis not present

## 2018-04-01 DIAGNOSIS — Z1231 Encounter for screening mammogram for malignant neoplasm of breast: Secondary | ICD-10-CM

## 2018-04-01 DIAGNOSIS — Z23 Encounter for immunization: Secondary | ICD-10-CM

## 2018-04-01 LAB — COMPREHENSIVE METABOLIC PANEL
ALBUMIN: 4.5 g/dL (ref 3.5–5.2)
ALK PHOS: 65 U/L (ref 39–117)
ALT: 16 U/L (ref 0–35)
AST: 19 U/L (ref 0–37)
BUN: 19 mg/dL (ref 6–23)
CO2: 30 mEq/L (ref 19–32)
Calcium: 9.7 mg/dL (ref 8.4–10.5)
Chloride: 101 mEq/L (ref 96–112)
Creatinine, Ser: 1.1 mg/dL (ref 0.40–1.20)
GFR: 51.23 mL/min — ABNORMAL LOW (ref >60.00)
GLUCOSE: 93 mg/dL (ref 70–99)
POTASSIUM: 3.9 meq/L (ref 3.5–5.1)
SODIUM: 139 meq/L (ref 135–145)
TOTAL PROTEIN: 8 g/dL (ref 6.0–8.3)
Total Bilirubin: 0.7 mg/dL (ref 0.2–1.2)

## 2018-04-01 LAB — MICROALBUMIN / CREATININE URINE RATIO
Creatinine,U: 176 mg/dL
Microalb Creat Ratio: 3.9 mg/g (ref 0.0–30.0)
Microalb, Ur: 6.9 mg/dL — ABNORMAL HIGH (ref 0.0–1.9)

## 2018-04-01 LAB — LDL CHOLESTEROL, DIRECT: Direct LDL: 145 mg/dL

## 2018-04-01 LAB — LIPID PANEL
Cholesterol: 227 mg/dL — ABNORMAL HIGH (ref 0–200)
HDL: 37 mg/dL — AB (ref >39.00)
NonHDL: 189.78
Total CHOL/HDL Ratio: 6
Triglycerides: 215 mg/dL — ABNORMAL HIGH (ref 0.0–149.0)
VLDL: 43 mg/dL — ABNORMAL HIGH (ref 0.0–40.0)

## 2018-04-01 LAB — HEMOGLOBIN A1C: HEMOGLOBIN A1C: 5.9 % (ref 4.6–6.5)

## 2018-04-01 MED ORDER — ZOSTER VAC RECOMB ADJUVANTED 50 MCG/0.5ML IM SUSR: 0.5000 mL | Freq: Once | INTRAMUSCULAR | 1 refills | Status: AC

## 2018-04-01 NOTE — Patient Instructions (Addendum)
I have ordered Cologuard.  This screening test for colon cancer is very accurate in patients who are at average risk for colon cancer, if  repeated  every 3 years, .  We can continue to use if until you are 84 for  colon CA screening, unless you develop a change in bowel function or a positive cologuard test result      I recommend treating your weak bones with an injectable medication called Prolia that we can give you twice a year.  We will need to obtain prior authorization from your insurance because it is quite costly without coverage, so I would like your permission to start the process since it takes several months .  Prolia is different than the other available  Medications used to treat osteoporosis but is highly recommended and has been well tolerated in women your age .   You also  need 1200 to 1800 mg of calcium daily   And 2000 IUs of D3 daily ..  I recommend getting half of your r calcium  through diet rather than supplements given the recent association of calcium supplements with increased coronary artery calcium scores (see attached list )   Health Maintenance for Postmenopausal Women Menopause is a normal process in which your reproductive ability comes to an end. This process happens gradually over a span of months to years, usually between the ages of 33 and 30. Menopause is complete when you have missed 12 consecutive menstrual periods. It is important to talk with your health care provider about some of the most common conditions that affect postmenopausal women, such as heart disease, cancer, and bone loss (osteoporosis). Adopting a healthy lifestyle and getting preventive care can help to promote your health and wellness. Those actions can also lower your chances of developing some of these common conditions. What should I know about menopause? During menopause, you may experience a number of symptoms, such as:  Moderate-to-severe hot flashes.  Night sweats.  Decrease in sex  drive.  Mood swings.  Headaches.  Tiredness.  Irritability.  Memory problems.  Insomnia.  Choosing to treat or not to treat menopausal changes is an individual decision that you make with your health care provider. What should I know about hormone replacement therapy and supplements? Hormone therapy products are effective for treating symptoms that are associated with menopause, such as hot flashes and night sweats. Hormone replacement carries certain risks, especially as you become older. If you are thinking about using estrogen or estrogen with progestin treatments, discuss the benefits and risks with your health care provider. What should I know about heart disease and stroke? Heart disease, heart attack, and stroke become more likely as you age. This may be due, in part, to the hormonal changes that your body experiences during menopause. These can affect how your body processes dietary fats, triglycerides, and cholesterol. Heart attack and stroke are both medical emergencies. There are many things that you can do to help prevent heart disease and stroke:  Have your blood pressure checked at least every 1-2 years. High blood pressure causes heart disease and increases the risk of stroke.  If you are 83-78 years old, ask your health care provider if you should take aspirin to prevent a heart attack or a stroke.  Do not use any tobacco products, including cigarettes, chewing tobacco, or electronic cigarettes. If you need help quitting, ask your health care provider.  It is important to eat a healthy diet and maintain a healthy weight. ?  Be sure to include plenty of vegetables, fruits, low-fat dairy products, and lean protein. ? Avoid eating foods that are high in solid fats, added sugars, or salt (sodium).  Get regular exercise. This is one of the most important things that you can do for your health. ? Try to exercise for at least 150 minutes each week. The type of exercise that  you do should increase your heart rate and make you sweat. This is known as moderate-intensity exercise. ? Try to do strengthening exercises at least twice each week. Do these in addition to the moderate-intensity exercise.  Know your numbers.Ask your health care provider to check your cholesterol and your blood glucose. Continue to have your blood tested as directed by your health care provider.  What should I know about cancer screening? There are several types of cancer. Take the following steps to reduce your risk and to catch any cancer development as early as possible. Breast Cancer  Practice breast self-awareness. ? This means understanding how your breasts normally appear and feel. ? It also means doing regular breast self-exams. Let your health care provider know about any changes, no matter how small.  If you are 43 or older, have a clinician do a breast exam (clinical breast exam or CBE) every year. Depending on your age, family history, and medical history, it may be recommended that you also have a yearly breast X-ray (mammogram).  If you have a family history of breast cancer, talk with your health care provider about genetic screening.  If you are at high risk for breast cancer, talk with your health care provider about having an MRI and a mammogram every year.  Breast cancer (BRCA) gene test is recommended for women who have family members with BRCA-related cancers. Results of the assessment will determine the need for genetic counseling and BRCA1 and for BRCA2 testing. BRCA-related cancers include these types: ? Breast. This occurs in males or females. ? Ovarian. ? Tubal. This may also be called fallopian tube cancer. ? Cancer of the abdominal or pelvic lining (peritoneal cancer). ? Prostate. ? Pancreatic.  Cervical, Uterine, and Ovarian Cancer Your health care provider may recommend that you be screened regularly for cancer of the pelvic organs. These include your  ovaries, uterus, and vagina. This screening involves a pelvic exam, which includes checking for microscopic changes to the surface of your cervix (Pap test).  For women ages 21-65, health care providers may recommend a pelvic exam and a Pap test every three years. For women ages 66-65, they may recommend the Pap test and pelvic exam, combined with testing for human papilloma virus (HPV), every five years. Some types of HPV increase your risk of cervical cancer. Testing for HPV may also be done on women of any age who have unclear Pap test results.  Other health care providers may not recommend any screening for nonpregnant women who are considered low risk for pelvic cancer and have no symptoms. Ask your health care provider if a screening pelvic exam is right for you.  If you have had past treatment for cervical cancer or a condition that could lead to cancer, you need Pap tests and screening for cancer for at least 20 years after your treatment. If Pap tests have been discontinued for you, your risk factors (such as having a new sexual partner) need to be reassessed to determine if you should start having screenings again. Some women have medical problems that increase the chance of getting cervical cancer.  In these cases, your health care provider may recommend that you have screening and Pap tests more often.  If you have a family history of uterine cancer or ovarian cancer, talk with your health care provider about genetic screening.  If you have vaginal bleeding after reaching menopause, tell your health care provider.  There are currently no reliable tests available to screen for ovarian cancer.  Lung Cancer Lung cancer screening is recommended for adults 91-39 years old who are at high risk for lung cancer because of a history of smoking. A yearly low-dose CT scan of the lungs is recommended if you:  Currently smoke.  Have a history of at least 30 pack-years of smoking and you currently  smoke or have quit within the past 15 years. A pack-year is smoking an average of one pack of cigarettes per day for one year.  Yearly screening should:  Continue until it has been 15 years since you quit.  Stop if you develop a health problem that would prevent you from having lung cancer treatment.  Colorectal Cancer  This type of cancer can be detected and can often be prevented.  Routine colorectal cancer screening usually begins at age 6 and continues through age 82.  If you have risk factors for colon cancer, your health care provider may recommend that you be screened at an earlier age.  If you have a family history of colorectal cancer, talk with your health care provider about genetic screening.  Your health care provider may also recommend using home test kits to check for hidden blood in your stool.  A small camera at the end of a tube can be used to examine your colon directly (sigmoidoscopy or colonoscopy). This is done to check for the earliest forms of colorectal cancer.  Direct examination of the colon should be repeated every 5-10 years until age 51. However, if early forms of precancerous polyps or small growths are found or if you have a family history or genetic risk for colorectal cancer, you may need to be screened more often.  Skin Cancer  Check your skin from head to toe regularly.  Monitor any moles. Be sure to tell your health care provider: ? About any new moles or changes in moles, especially if there is a change in a mole's shape or color. ? If you have a mole that is larger than the size of a pencil eraser.  If any of your family members has a history of skin cancer, especially at a young age, talk with your health care provider about genetic screening.  Always use sunscreen. Apply sunscreen liberally and repeatedly throughout the day.  Whenever you are outside, protect yourself by wearing long sleeves, pants, a wide-brimmed hat, and  sunglasses.  What should I know about osteoporosis? Osteoporosis is a condition in which bone destruction happens more quickly than new bone creation. After menopause, you may be at an increased risk for osteoporosis. To help prevent osteoporosis or the bone fractures that can happen because of osteoporosis, the following is recommended:  If you are 33-43 years old, get at least 1,000 mg of calcium and at least 600 mg of vitamin D per day.  If you are older than age 1 but younger than age 3, get at least 1,200 mg of calcium and at least 600 mg of vitamin D per day.  If you are older than age 52, get at least 1,200 mg of calcium and at least 800 mg of vitamin  D per day.  Smoking and excessive alcohol intake increase the risk of osteoporosis. Eat foods that are rich in calcium and vitamin D, and do weight-bearing exercises several times each week as directed by your health care provider. What should I know about how menopause affects my mental health? Depression may occur at any age, but it is more common as you become older. Common symptoms of depression include:  Low or sad mood.  Changes in sleep patterns.  Changes in appetite or eating patterns.  Feeling an overall lack of motivation or enjoyment of activities that you previously enjoyed.  Frequent crying spells.  Talk with your health care provider if you think that you are experiencing depression. What should I know about immunizations? It is important that you get and maintain your immunizations. These include:  Tetanus, diphtheria, and pertussis (Tdap) booster vaccine.  Influenza every year before the flu season begins.  Pneumonia vaccine.  Shingles vaccine.  Your health care provider may also recommend other immunizations. This information is not intended to replace advice given to you by your health care provider. Make sure you discuss any questions you have with your health care provider. Document Released:  06/23/2005 Document Revised: 11/19/2015 Document Reviewed: 02/02/2015 Elsevier Interactive Patient Education  2018 [employer].
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
## 2018-04-02 NOTE — Assessment & Plan Note (Signed)
She is due .  cologuard offered and accepted.

## 2018-04-02 NOTE — Assessment & Plan Note (Signed)
Well controlled on current regimen. Renal function stable, no changes today.  Lab Results  Component Value Date   CREATININE 1.10 04/01/2018   Lab Results  Component Value Date   NA 139 04/01/2018   K 3.9 04/01/2018   CL 101 04/01/2018   CO2 30 04/01/2018

## 2018-04-02 NOTE — Assessment & Plan Note (Signed)
Breast exam norma.l.  Screening mammogram ordered.

## 2018-04-06 DIAGNOSIS — Z1211 Encounter for screening for malignant neoplasm of colon: Secondary | ICD-10-CM | POA: Diagnosis not present

## 2018-04-10 ENCOUNTER — Telehealth: Payer: Self-pay | Admitting: Internal Medicine

## 2018-04-10 ENCOUNTER — Encounter: Payer: Self-pay | Admitting: Internal Medicine

## 2018-04-10 DIAGNOSIS — Z789 Other specified health status: Secondary | ICD-10-CM | POA: Insufficient documentation

## 2018-04-10 NOTE — Telephone Encounter (Unsigned)
Copied from [place] [id number]. Topic: General - Other >> Apr 10, 2018 10:01 AM Yvette Rack wrote: Reason for CRM: Pt returned call for lab results. Pt requests call back. Cb# [phone]

## 2018-04-10 NOTE — Telephone Encounter (Signed)
Lab results given and documented in result note 

## 2018-04-11 LAB — COLOGUARD: Cologuard: POSITIVE

## 2018-04-17 ENCOUNTER — Telehealth: Payer: Self-pay | Admitting: Internal Medicine

## 2018-04-17 DIAGNOSIS — D126 Benign neoplasm of colon, unspecified: Secondary | ICD-10-CM | POA: Insufficient documentation

## 2018-04-17 DIAGNOSIS — R195 Other fecal abnormalities: Secondary | ICD-10-CM

## 2018-04-17 NOTE — Telephone Encounter (Signed)
LMTCB. PEC may speak with pt.  

## 2018-04-17 NOTE — Telephone Encounter (Signed)
The results of patient's cologuard is  Positive. This may indicate a polyp somewhere in the colon.  I  would like to refer her to GI for further evaluation .    Is she willing to see one and does she have a preference?  

## 2018-04-18 NOTE — Telephone Encounter (Signed)
Attempted to call patient back on call back number- voicemail not set up.

## 2018-04-18 NOTE — Telephone Encounter (Signed)
Patient called back regarding her test results. Call back number is # [phone]

## 2018-04-18 NOTE — Addendum Note (Signed)
Addended by: [name] on: 04/18/2018 06:00 PM   Modules accepted: Orders

## 2018-04-18 NOTE — Telephone Encounter (Signed)
Referral is in process as requested to dr Vira Agar

## 2018-04-18 NOTE — Telephone Encounter (Signed)
Pt given results per Dr Derrel Nip;  the pt is agreeable to seeing GI; she requests Dr Tiffany Kocher; the pt's best contact number is [phone]; unable to chart in result note because no encounter created; will route to office for notification.

## 2018-04-18 NOTE — Telephone Encounter (Signed)
FYI

## 2018-05-02 ENCOUNTER — Encounter: Payer: Self-pay | Admitting: Internal Medicine

## 2018-05-13 DIAGNOSIS — R195 Other fecal abnormalities: Secondary | ICD-10-CM | POA: Diagnosis not present

## 2018-05-20 ENCOUNTER — Encounter: Payer: Self-pay | Admitting: Internal Medicine

## 2018-05-27 ENCOUNTER — Other Ambulatory Visit: Payer: Self-pay | Admitting: Internal Medicine

## 2018-06-03 DIAGNOSIS — M1711 Unilateral primary osteoarthritis, right knee: Secondary | ICD-10-CM | POA: Diagnosis not present

## 2018-06-03 DIAGNOSIS — M76892 Other specified enthesopathies of left lower limb, excluding foot: Secondary | ICD-10-CM | POA: Diagnosis not present

## 2018-06-03 DIAGNOSIS — M25562 Pain in left knee: Secondary | ICD-10-CM | POA: Diagnosis not present

## 2018-06-03 DIAGNOSIS — M7122 Synovial cyst of popliteal space [Baker], left knee: Secondary | ICD-10-CM | POA: Diagnosis not present

## 2018-06-10 ENCOUNTER — Other Ambulatory Visit: Payer: Self-pay | Admitting: Internal Medicine

## 2018-06-11 DIAGNOSIS — K5730 Diverticulosis of large intestine without perforation or abscess without bleeding: Secondary | ICD-10-CM | POA: Diagnosis not present

## 2018-06-11 DIAGNOSIS — Z1211 Encounter for screening for malignant neoplasm of colon: Secondary | ICD-10-CM | POA: Diagnosis not present

## 2018-06-11 DIAGNOSIS — K640 First degree hemorrhoids: Secondary | ICD-10-CM | POA: Diagnosis not present

## 2018-06-11 DIAGNOSIS — D127 Benign neoplasm of rectosigmoid junction: Secondary | ICD-10-CM | POA: Diagnosis not present

## 2018-06-11 DIAGNOSIS — K635 Polyp of colon: Secondary | ICD-10-CM | POA: Diagnosis not present

## 2018-06-11 DIAGNOSIS — D120 Benign neoplasm of cecum: Secondary | ICD-10-CM | POA: Diagnosis not present

## 2018-06-11 DIAGNOSIS — D126 Benign neoplasm of colon, unspecified: Secondary | ICD-10-CM | POA: Diagnosis not present

## 2018-06-11 DIAGNOSIS — D123 Benign neoplasm of transverse colon: Secondary | ICD-10-CM | POA: Diagnosis not present

## 2018-06-11 LAB — HM COLONOSCOPY

## 2018-07-11 ENCOUNTER — Ambulatory Visit
Admission: RE | Admit: 2018-07-11 | Discharge: 2018-07-11 | Disposition: A | Discharge status: Home / Self Care | Payer: Medicare Other | Source: Ambulatory Visit | Attending: Internal Medicine | Admitting: Internal Medicine

## 2018-07-11 DIAGNOSIS — Z1231 Encounter for screening mammogram for malignant neoplasm of breast: Secondary | ICD-10-CM | POA: Insufficient documentation

## 2018-07-12 ENCOUNTER — Inpatient Hospital Stay: Payer: Medicare Other | Attending: Internal Medicine | Admitting: Internal Medicine

## 2018-07-12 ENCOUNTER — Other Ambulatory Visit: Payer: Self-pay

## 2018-07-12 ENCOUNTER — Inpatient Hospital Stay: Payer: Medicare Other

## 2018-07-12 ENCOUNTER — Encounter: Payer: Self-pay | Admitting: Internal Medicine

## 2018-07-12 VITALS — BP 160/78 | HR 67 | Temp 96.7°F | Resp 16 | Wt 144.4 lb

## 2018-07-12 DIAGNOSIS — C8213 Follicular lymphoma grade II, intra-abdominal lymph nodes: Secondary | ICD-10-CM

## 2018-07-12 DIAGNOSIS — Z90710 Acquired absence of both cervix and uterus: Secondary | ICD-10-CM | POA: Insufficient documentation

## 2018-07-12 DIAGNOSIS — I10 Essential (primary) hypertension: Secondary | ICD-10-CM | POA: Diagnosis not present

## 2018-07-12 DIAGNOSIS — R42 Dizziness and giddiness: Secondary | ICD-10-CM | POA: Diagnosis not present

## 2018-07-12 DIAGNOSIS — Z791 Long term (current) use of non-steroidal anti-inflammatories (NSAID): Secondary | ICD-10-CM

## 2018-07-12 DIAGNOSIS — Z79899 Other long term (current) drug therapy: Secondary | ICD-10-CM | POA: Insufficient documentation

## 2018-07-12 DIAGNOSIS — Z9221 Personal history of antineoplastic chemotherapy: Secondary | ICD-10-CM | POA: Diagnosis not present

## 2018-07-12 DIAGNOSIS — E785 Hyperlipidemia, unspecified: Secondary | ICD-10-CM

## 2018-07-12 LAB — CBC WITH DIFFERENTIAL/PLATELET
Abs Immature Granulocytes: 0.03 10*3/uL (ref 0.00–0.07)
BASOS ABS: 0 10*3/uL (ref 0.0–0.1)
Basophils Relative: 0 %
Eosinophils Absolute: 0.3 10*3/uL (ref 0.0–0.5)
Eosinophils Relative: 2 %
HCT: 37.7 % (ref 36.0–46.0)
Hemoglobin: 13 g/dL (ref 12.0–15.0)
Immature Granulocytes: 0 %
Lymphocytes Relative: 20 %
Lymphs Abs: 2.2 10*3/uL (ref 0.7–4.0)
MCH: 32.1 pg (ref 26.0–34.0)
MCHC: 34.5 g/dL (ref 30.0–36.0)
MCV: 93.1 fL (ref 80.0–100.0)
Monocytes Absolute: 0.8 10*3/uL (ref 0.1–1.0)
Monocytes Relative: 8 %
Neutro Abs: 7.6 10*3/uL (ref 1.7–7.7)
Neutrophils Relative %: 70 %
Platelets: 209 10*3/uL (ref 150–400)
RBC: 4.05 MIL/uL (ref 3.87–5.11)
RDW: 12.4 % (ref 11.5–15.5)
WBC: 11 10*3/uL — ABNORMAL HIGH (ref 4.0–10.5)
nRBC: 0 % (ref 0.0–0.2)

## 2018-07-12 LAB — LACTATE DEHYDROGENASE: LDH: 162 U/L (ref 98–192)

## 2018-07-12 NOTE — Progress Notes (Signed)
[facility] OFFICE PROGRESS NOTE  Patient Care Team: [name], MD as PCP - General (Internal Medicine) Bary Castilla Forest Gleason, MD (General Surgery)  Cancer Staging No matching staging information was found for the patient.   Oncology History    # NOV 0254- FOLLICULAR LYMPHOMA G-2; [s/p Bx- RETROPERITONEAL/LEFT Peri-aortic LN ~ 2.5CM [incidental on CT scan in 2016; CT- Nov 2014- ~1CM]/ PET 2016-Left Peri-aortic LN; Suv ~15; Ext Iliac LN 6 mm; Feb 2017- Unchanged LN; START Rituxan q week x4 [finished march 15th]; PET JAN 30th 2018- NED.   # Hx of Shingles [Sep 2016]-   # Kidney cysts [previous Dr.Wolfe]  DIAGNOSIS: Follicle lymphoma-G-2  STAGE: Stage II       ;GOALS: Control  CURRENT/MOST RECENT THERAPY: Surveillance      Follicular lymphoma grade ii, intra-abdominal lymph nodes ([place])      INTERVAL HISTORY:  Whitney Molina 78 y.o.  female pleasant patient above history of stage II follicular lymphoma-grade 2 is here for follow-up.  Patient had episode of vertigo that is improving.  She denies any abdominal pain nausea vomiting night sweats or new lumps or bumps.    Review of Systems  Constitutional: Negative for chills, diaphoresis, fever, malaise/fatigue and weight loss.  HENT: Negative for nosebleeds and sore throat.   Eyes: Negative for double vision.  Respiratory: Negative for cough, hemoptysis, sputum production, shortness of breath and wheezing.   Cardiovascular: Negative for chest pain, palpitations, orthopnea and leg swelling.  Gastrointestinal: Negative for abdominal pain, blood in stool, constipation, diarrhea, heartburn, melena, nausea and vomiting.  Genitourinary: Negative for dysuria, frequency and urgency.  Musculoskeletal: Negative for back pain and joint pain.  Skin: Negative.  Negative for itching and rash.  Neurological: Negative for dizziness, tingling, focal weakness, weakness and headaches.  Endo/Heme/Allergies: Does not  bruise/bleed easily.  Psychiatric/Behavioral: Negative for depression. The patient is not nervous/anxious and does not have insomnia.       PAST MEDICAL HISTORY :  Past Medical History:  Diagnosis Date  . Basal cell carcinoma of nose   . Bronchitis   . GERD (gastroesophageal reflux disease)   . Heart murmur   . Hemorrhoids   . History of colon polyps   . History of kidney stones   . History of mammogram 2016  . Hyperlipidemia   . Hypertension   . Non Hodgkin's lymphoma ([place])   . 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 ABDOMINAL HYSTERECTOMY  1984  . BLADDER SUSPENSION  2010  . CHOLECYSTECTOMY  06-01-03  . COLONOSCOPY  2003  . LITHOTRIPSY  2005  . local exicsion skin cancer  2004   squamous cell of chest wall. left chest  . OOPHORECTOMY    . TONSILLECTOMY  1971  . TUBAL LIGATION  1975  . VAGINAL PROLAPSE REPAIR  July 2001   Pelvic Prolapse    FAMILY HISTORY :   Family History  Problem Relation Age of Onset  . Stroke Mother   . Breast cancer Mother 69  . Stroke Father   . Dementia Father   . Cancer Brother        bladder  . Stroke Brother   . Breast cancer Paternal Aunt 37  . Bipolar disorder Son     SOCIAL HISTORY:   Social History   Tobacco Use  . Smoking status: Never  Smoker  . Smokeless tobacco: Never Used  Substance Use Topics  . Alcohol use: No  . Drug use: No    ALLERGIES:  has No Known Allergies.  MEDICATIONS:  Current Outpatient Medications  Medication Sig Dispense Refill  . Cholecalciferol (VITAMIN D3) 1000 units CAPS Take 1,000 Units by mouth daily.    Marland Kitchen docusate sodium (COLACE) 100 MG capsule Take 100 mg by mouth daily.    Marland Kitchen escitalopram (LEXAPRO) 10 MG tablet TAKE 1 TABLET BY MOUTH ONCE DAILY. 90 tablet 1  . hyoscyamine (LEVBID) 0.375 MG 12 hr tablet TAKE 1 TABLET BY MOUTH ONCE DAILY 90 tablet 1  . ibuprofen  (ADVIL,MOTRIN) 600 MG tablet Take 1 tablet (600 mg total) by mouth every 8 (eight) hours as needed. 15 tablet 0  . losartan (COZAAR) 100 MG tablet TAKE 1 TABLET BY MOUTH ONCE DAILY *NEEDSAN APPOINTMENT* 90 tablet 1  . metoprolol succinate (TOPROL-XL) 25 MG 24 hr tablet TAKE 1 TABLET BY MOUTH ONCE DAILY 90 tablet 3  . montelukast (SINGULAIR) 10 MG tablet TAKE 1 TABLET BY MOUTH AT BEDTIME 30 tablet 3  . potassium chloride SA (K-DUR,KLOR-CON) 20 MEQ tablet TAKE 1 TABLET BY MOUTH ONCE DAILY 90 tablet 1  . Probiotic Product (HEALTHY COLON PO) Take 1 capsule by mouth daily.    . simvastatin (ZOCOR) 40 MG tablet TAKE 1 TABLET EACH EVENING 90 tablet 1  . triamterene-hydrochlorothiazide (MAXZIDE-25) 37.5-25 MG tablet Take 0.5 tablets by mouth daily. 90 tablet 3  . VENTOLIN HFA 108 (90 Base) MCG/ACT inhaler Inhale 1 puff into the lungs every 6 (six) hours as needed for wheezing or shortness of breath. 1 Inhaler 6   No current facility-administered medications for this visit.     PHYSICAL EXAMINATION: ECOG PERFORMANCE STATUS: 0 - Asymptomatic  BP (!) 160/78 (BP Location: Left Arm, Patient Position: Sitting, Cuff Size: Normal)   Pulse 67   Temp (!) 96.7 F (35.9 C) (Tympanic)   Resp 16   Wt 144 lb 6.4 oz (65.5 kg)   BMI 25.18 kg/m   Filed Weights   07/12/18 1424  Weight: 144 lb 6.4 oz (65.5 kg)    Physical Exam  Constitutional: She is oriented to person, place, and time and well-developed, well-nourished, and in no distress.  Accompanied by her husband.  Walking myself.  HENT:  Head: Normocephalic and atraumatic.  Mouth/Throat: Oropharynx is clear and moist. No oropharyngeal exudate.  Eyes: Pupils are equal, round, and reactive to light.  Neck: Normal range of motion. Neck supple.  Cardiovascular: Normal rate and regular rhythm.  Pulmonary/Chest: No respiratory distress. She has no wheezes.  Abdominal: Soft. Bowel sounds are normal. She exhibits no distension and no mass. There is no  abdominal tenderness. There is no rebound and no guarding.  Musculoskeletal: Normal range of motion.        General: No tenderness or edema.  Neurological: She is alert and oriented to person, place, and time.  Skin: Skin is warm.  Psychiatric: Affect normal.       LABORATORY DATA:  I have reviewed the data as listed    Component Value Date/Time   NA 139 04/01/2018 1116   NA 138 09/10/2013 0758   K 3.9 04/01/2018 1116   K 3.3 (L) 09/10/2013 0758   CL 101 04/01/2018 1116   CL 100 09/10/2013 0758   CO2 30 04/01/2018 1116   CO2 32 09/10/2013 0758   GLUCOSE 93 04/01/2018 1116   GLUCOSE 85 09/10/2013 0758   BUN  19 04/01/2018 1116   BUN 13 09/10/2013 0758   CREATININE 1.10 04/01/2018 1116   CREATININE 0.96 12/22/2013 0857   CALCIUM 9.7 04/01/2018 1116   CALCIUM 9.2 09/10/2013 0758   PROT 8.0 04/01/2018 1116   PROT 8.3 (H) 09/10/2013 0758   ALBUMIN 4.5 04/01/2018 1116   ALBUMIN 3.5 09/10/2013 0758   AST 19 04/01/2018 1116   AST 23 09/10/2013 0758   ALT 16 04/01/2018 1116   ALT 28 09/10/2013 0758   ALKPHOS 65 04/01/2018 1116   ALKPHOS 78 09/10/2013 0758   BILITOT 0.7 04/01/2018 1116   BILITOT 0.6 09/10/2013 0758   GFRNONAA 50 (L) 01/10/2018 1022   GFRNONAA 59 (L) 12/22/2013 0857   GFRAA 58 (L) 01/10/2018 1022   GFRAA 68 12/22/2013 0857    No results found for: SPEP, UPEP  Lab Results  Component Value Date   WBC 11.0 (H) 07/12/2018   NEUTROABS 7.6 07/12/2018   HGB 13.0 07/12/2018   HCT 37.7 07/12/2018   MCV 93.1 07/12/2018   PLT 209 07/12/2018      Chemistry      Component Value Date/Time   NA 139 04/01/2018 1116   NA 138 09/10/2013 0758   K 3.9 04/01/2018 1116   K 3.3 (L) 09/10/2013 0758   CL 101 04/01/2018 1116   CL 100 09/10/2013 0758   CO2 30 04/01/2018 1116   CO2 32 09/10/2013 0758   BUN 19 04/01/2018 1116   BUN 13 09/10/2013 0758   CREATININE 1.10 04/01/2018 1116   CREATININE 0.96 12/22/2013 0857      Component Value Date/Time   CALCIUM 9.7  04/01/2018 1116   CALCIUM 9.2 09/10/2013 0758   ALKPHOS 65 04/01/2018 1116   ALKPHOS 78 09/10/2013 0758   AST 19 04/01/2018 1116   AST 23 09/10/2013 0758   ALT 16 04/01/2018 1116   ALT 28 09/10/2013 0758   BILITOT 0.7 04/01/2018 1116   BILITOT 0.6 09/10/2013 0758       RADIOGRAPHIC STUDIES: I have personally reviewed the radiological images as listed and agreed with the findings in the report. Mm 3d Screen Breast Bilateral  Result Date: 07/11/2018 CLINICAL DATA:  Screening. EXAM: DIGITAL SCREENING BILATERAL MAMMOGRAM WITH TOMO AND CAD COMPARISON:  Previous exam(s). ACR Breast Density Category c: The breast tissue is heterogeneously dense, which may obscure small masses. FINDINGS: There are no findings suspicious for malignancy. Images were processed with CAD. IMPRESSION: No mammographic evidence of malignancy. A result letter of this screening mammogram will be mailed directly to the patient. RECOMMENDATION: Screening mammogram in one year. (Code:SM-B-01Y) BI-RADS CATEGORY  1: Negative. Electronically Signed   By: Kristopher Oppenheim M.D.   On: 07/11/2018 12:28     ASSESSMENT & PLAN:  Follicular lymphoma grade ii, intra-abdominal lymph nodes ([place]) # RETROPERITONEAL LN [incidental/asymptomatic]- follicular lymphoma grade 2; likely stage II; status post rituximab. FEB 2019 PET scan NED.    #Clinically no evidence recurrence.  Stable.  Get scans on clinical basis.  # Elevated BP: Today systolic 937.  At home around 140s to 150s.  Awaiting evaluation with PCP regarding adjustment of blood pressures  # vertigo-likely peripheral causes.  Improving.  # cancer screening- colo in Jan 2020 [Dr.Elliot]; Mammo- feb 2020-Normal.  # DISPOSITION: # follow up in 6 months/labs-cbc/cmp,ldh-Dr.B; .   Cc: Dr.Tullo   No orders of the defined types were placed in this encounter.  All questions were answered. The patient knows to call the clinic with any problems, questions or concerns.  Cammie Sickle, MD 07/12/2018 3:22 PM

## 2018-07-12 NOTE — Assessment & Plan Note (Addendum)
#   RETROPERITONEAL LN [incidental/asymptomatic]- follicular lymphoma grade 2; likely stage II; status post rituximab. FEB 2019 PET scan NED.    #Clinically no evidence recurrence.  Stable.  Get scans on clinical basis.  # Elevated BP: Today systolic 047.  At home around 140s to 150s.  Awaiting evaluation with PCP regarding adjustment of blood pressures  # vertigo-likely peripheral causes.  Improving.  # cancer screening- colo in Jan 2020 [Dr.Elliot]; Mammo- feb 2020-Normal.  # DISPOSITION: # follow up in 6 months/labs-cbc/cmp,ldh-Dr.B; .   Cc: Dr.Tullo

## 2018-07-15 ENCOUNTER — Encounter: Payer: Self-pay | Admitting: Family Medicine

## 2018-07-15 ENCOUNTER — Other Ambulatory Visit: Payer: Self-pay | Admitting: Internal Medicine

## 2018-07-15 ENCOUNTER — Ambulatory Visit (INDEPENDENT_AMBULATORY_CARE_PROVIDER_SITE_OTHER): Payer: Medicare Other | Admitting: Family Medicine

## 2018-07-15 VITALS — BP 150/94 | HR 70 | Temp 98.7°F | Resp 18 | Ht 63.5 in | Wt 143.8 lb

## 2018-07-15 DIAGNOSIS — J0140 Acute pansinusitis, unspecified: Secondary | ICD-10-CM

## 2018-07-15 DIAGNOSIS — I10 Essential (primary) hypertension: Secondary | ICD-10-CM | POA: Diagnosis not present

## 2018-07-15 MED ORDER — AMOXICILLIN-POT CLAVULANATE 875-125 MG PO TABS: 1.0000 | ORAL_TABLET | Freq: Two times a day (BID) | ORAL | 0 refills | Status: AC

## 2018-07-15 MED ORDER — BENZONATATE 100 MG PO CAPS: 100.0000 mg | ORAL_CAPSULE | Freq: Two times a day (BID) | ORAL | 0 refills | Status: DC | PRN

## 2018-07-15 NOTE — Progress Notes (Signed)
Subjective:     Whitney Molina is a 78 y.o. female presenting for Cough (x 2 weeks. Feeling worse. Low grade fever-99ish at night, body aches, head pressure, nasal congestion, runny nose, sore throat. Has taking Mucinex, Ibuprofen and Ventolin inhaler as needed.)     Cough  This is a new problem. The current episode started 1 to 4 weeks ago. The problem has been gradually worsening. The cough is productive of purulent sputum and productive of blood-tinged sputum. Associated symptoms include a fever, headaches, myalgias, nasal congestion, postnasal drip, rhinorrhea and a sore throat. Pertinent negatives include no chest pain, ear congestion, ear pain, shortness of breath or wheezing. The symptoms are aggravated by lying down. She has tried a beta-agonist inhaler (NSAIDs, mucinex) for the symptoms. The treatment provided mild relief. Her past medical history is significant for asthma and environmental allergies. There is no history of COPD.   Using saline spray  #dizziness - when she stands up and gets out of bed in the AM - not when turning over - better with sitting down  Review of Systems  Constitutional: Positive for fever.  HENT: Positive for congestion, postnasal drip, rhinorrhea, sneezing and sore throat. Negative for ear pain, sinus pressure, sinus pain and trouble swallowing.   Respiratory: Positive for cough. Negative for chest tightness, shortness of breath and wheezing.   Cardiovascular: Negative for chest pain.  Gastrointestinal: Negative for diarrhea, nausea and vomiting.  Musculoskeletal: Positive for arthralgias and myalgias.  Allergic/Immunologic: Positive for environmental allergies.  Neurological: Positive for dizziness and headaches.     Social History   Tobacco Use  Smoking Status Never Smoker  Smokeless Tobacco Never Used        Objective:    BP Readings from Last 3 Encounters:  07/15/18 (!) 150/94  07/12/18 (!) 160/78  04/01/18 128/84   Wt  Readings from Last 3 Encounters:  07/15/18 143 lb 12 oz (65.2 kg)  07/12/18 144 lb 6.4 oz (65.5 kg)  04/01/18 145 lb 12.8 oz (66.1 kg)    BP (!) 150/94   Pulse 70   Temp 98.7 F (37.1 C)   Resp 18   Ht 5' 3.5" (1.613 m)   Wt 143 lb 12 oz (65.2 kg)   SpO2 97%   BMI 25.06 kg/m    Physical Exam Constitutional:      General: She is not in acute distress.    Appearance: She is well-developed. She is not diaphoretic.  HENT:     Head: Normocephalic and atraumatic.     Right Ear: Tympanic membrane and ear canal normal.     Left Ear: Tympanic membrane and ear canal normal.     Nose: Mucosal edema and rhinorrhea present.     Right Sinus: No maxillary sinus tenderness or frontal sinus tenderness.     Left Sinus: No maxillary sinus tenderness or frontal sinus tenderness.     Mouth/Throat:     Pharynx: Uvula midline. Posterior oropharyngeal erythema present. No oropharyngeal exudate.     Tonsils: Swelling: 0 on the right. 0 on the left.  Eyes:     General: No scleral icterus.    Conjunctiva/sclera: Conjunctivae normal.  Neck:     Musculoskeletal: Neck supple.  Cardiovascular:     Rate and Rhythm: Normal rate and regular rhythm.     Heart sounds: Normal heart sounds. No murmur.  Pulmonary:     Effort: Pulmonary effort is normal. No respiratory distress.     Breath sounds: Normal breath  sounds. No wheezing.  Lymphadenopathy:     Cervical: No cervical adenopathy.  Skin:    General: Skin is warm and dry.     Capillary Refill: Capillary refill takes less than 2 seconds.  Neurological:     Mental Status: She is alert.           Assessment & Plan:   Problem List Items Addressed This Visit      Cardiovascular and Mediastinum   Essential hypertension    Noting some dizziness first thing in the AM. Discussed orthostatic precautions. Pt worried about high BP and recommended home monitoring and f/u with pcp if not improved in 2 weeks       Other Visit Diagnoses    Acute  non-recurrent pansinusitis    -  Primary   Relevant Medications   amoxicillin-clavulanate (AUGMENTIN) 875-125 MG tablet   benzonatate (TESSALON) 100 MG capsule       Return if symptoms worsen or fail to improve.  Lesleigh Noe, MD

## 2018-07-15 NOTE — Assessment & Plan Note (Signed)
Noting some dizziness first thing in the AM. Discussed orthostatic precautions. Pt worried about high BP and recommended home monitoring and f/u with pcp if not improved in 2 weeks

## 2018-07-15 NOTE — Patient Instructions (Addendum)
   1. Drink plenty of fluids 2. Get lots of rest  Sinus Congestion 1) Neti Pot (Saline rinse) -- 2 times day -- if tolerated 2) Flonase (Store Brand ok) - once daily   Cough 1) Cough drops can be helpful 2) Honey is proven to be one of the best cough medications  3) Cough medication  Sore Throat 1) Honey as above, cough drops 2) Ibuprofen or Aleve can be helpful 3) Salt water Gargles  If you develop fevers (Temperature >100.4), chills, worsening symptoms or symptoms lasting longer than 10 days return to clinic.    Before you get out of bed - criss cross your legs  - sit on the side of the bed for 2-5 minutes - then stand up

## 2018-08-14 ENCOUNTER — Other Ambulatory Visit: Payer: Self-pay | Admitting: Internal Medicine

## 2018-09-12 ENCOUNTER — Encounter: Payer: Self-pay | Admitting: Internal Medicine

## 2018-09-12 ENCOUNTER — Ambulatory Visit: Payer: Medicare Other

## 2018-09-12 ENCOUNTER — Other Ambulatory Visit: Payer: Self-pay

## 2018-09-12 ENCOUNTER — Ambulatory Visit (INDEPENDENT_AMBULATORY_CARE_PROVIDER_SITE_OTHER): Payer: Medicare Other

## 2018-09-12 ENCOUNTER — Ambulatory Visit (INDEPENDENT_AMBULATORY_CARE_PROVIDER_SITE_OTHER): Payer: Medicare Other | Admitting: Internal Medicine

## 2018-09-12 DIAGNOSIS — M545 Low back pain, unspecified: Secondary | ICD-10-CM

## 2018-09-12 DIAGNOSIS — Z0000 Encounter for general adult medical examination without abnormal findings: Secondary | ICD-10-CM

## 2018-09-12 NOTE — Patient Instructions (Addendum)
  Ms. Calloway , Thank you for taking time to come for your Medicare Wellness Visit. I appreciate your ongoing commitment to your health goals. Please review the following plan we discussed and let me know if I can assist you in the future.   These are the goals we discussed: Goals      Patient Stated   . Increase physical activity (pt-stated)     Walk more for exercise       This is a list of the screening recommended for you and due dates:  Health Maintenance  Topic Date Due  . Flu Shot  12/14/2018  . Mammogram  07/12/2019  . Tetanus Vaccine  11/22/2022  . DEXA scan (bone density measurement)  Completed  . Pneumonia vaccines  Completed

## 2018-09-12 NOTE — Progress Notes (Signed)
Virtual Visit converted to Telephone  Note  This visit type was conducted due to national recommendations for restrictions regarding the COVID-19 pandemic (e.g. social distancing).  This format is felt to be most appropriate for this patient at this time.  All issues noted in this document were discussed and addressed.  No physical exam was performed (except for noted visual exam findings with Video Visits).   I connected with@ on 09/15/18 at  9:30 AM EDT by  telephone and verified that I am speaking with the correct person using two identifiers. Location patient: home Location provider: work or home office Persons participating in the virtual visit: patient, provider  I discussed the limitations, risks, security and privacy concerns of performing an evaluation and management service by telephone and the availability of in person appointments. I also discussed with the patient that there may be a patient responsible charge related to this service. The patient expressed understanding and agreed to proceed.  Interactive audio and video telecommunications were attempted between this provider and patient, however failed, due to patient having technical difficulties  With camera activation.   We continued and completed visit with audio only.  Reason for visit: 6 month follow up   HPI: 4 y r old female with history OF Follicular lymphoma, Hypertension,  COPD with bronchiectasis ,prediabetes and hyperlipidemia   The patient has no signs or symptoms of COVID 19 infection (fever, cough, sore throat  or shortness of breath beyond what is typical for patient).  Patient denies contact with other persons with the above mentioned symptoms or with anyone confirmed to have COVID 19 . 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  reaching 144 lbs  Goal 140.  She  has mild persistent pain secondary to a baker's cyst left knee [facility] offering to drain   Treated for recurrent pansinusitis in early march to resolution  Allergic rhinitis not using singulair due ot dry mouth.  Using 1/2 zurtec bid.  Home bps 130/84 or less   Seeing brahmanday for management of lymphoma  Last visit Feb 2020 Now in remission last PET Feb 2019  Will have follow up visit  in August 2020 Home bp 134/82  Drinks lots of water.  Taking zocor 20 mg and red yeast rice  Wt 144 lbs stable goal 140 ROS: See pertinent positives and negatives per HPI.  Past Medical History:  Diagnosis Date  . Basal cell carcinoma of nose   . Bronchitis   . GERD (gastroesophageal reflux disease)   . Heart murmur   . Hemorrhoids   . History of basal cell carcinoma    right upper lip, right ant shoulder  . History of colon polyps   . History of dysplastic nevus 12/05/2001   left paraspinal upper back, upper back spinal  . History of kidney stones   . History of mammogram 2016  . History of squamous cell carcinoma 02/07/2005   left ant chest  . Hyperlipidemia   . Hypertension   . Non Hodgkin's lymphoma ([place])   . 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 ABDOMINAL HYSTERECTOMY  1984  . BLADDER SUSPENSION  2010  . CHOLECYSTECTOMY  06-01-03  . COLONOSCOPY  2003  . LITHOTRIPSY  2005  .  local exicsion skin cancer  2004   squamous cell of chest wall. left chest  . OOPHORECTOMY    . TONSILLECTOMY  1971  . TUBAL LIGATION  1975  . VAGINAL PROLAPSE REPAIR  July 2001   Pelvic Prolapse    Family History  Problem Relation Age of Onset  . Stroke Mother   . Breast cancer Mother 40  . Stroke Father   . Dementia Father   . Cancer Brother        bladder  . Stroke Brother   . Breast cancer Paternal Aunt 36  . Bipolar disorder Son     SOCIAL HX: married.  IADLS   Current Outpatient  Medications:  .  Cholecalciferol (VITAMIN D3) 1000 units CAPS, Take 1,000 Units by mouth daily., Disp: , Rfl:  .  docusate sodium (COLACE) 100 MG capsule, Take 100 mg by mouth daily., Disp: , Rfl:  .  escitalopram (LEXAPRO) 10 MG tablet, TAKE 1 TABLET BY MOUTH ONCE DAILY., Disp: 90 tablet, Rfl: 1 .  hyoscyamine (LEVBID) 0.375 MG 12 hr tablet, TAKE 1 TABLET BY MOUTH ONCE DAILY., Disp: 90 tablet, Rfl: 1 .  losartan (COZAAR) 100 MG tablet, TAKE 1 TABLET BY MOUTH ONCE DAILY *NEEDSAN APPOINTMENT*, Disp: 90 tablet, Rfl: 1 .  metoprolol succinate (TOPROL-XL) 25 MG 24 hr tablet, TAKE 1 TABLET BY MOUTH ONCE DAILY, Disp: 90 tablet, Rfl: 3 .  potassium chloride SA (K-DUR,KLOR-CON) 20 MEQ tablet, TAKE 1 TABLET BY MOUTH ONCE DAILY, Disp: 90 tablet, Rfl: 1 .  Probiotic Product (HEALTHY COLON PO), Take 1 capsule by mouth daily., Disp: , Rfl:  .  simvastatin (ZOCOR) 40 MG tablet, TAKE 1 TABLET EACH EVENING, Disp: 90 tablet, Rfl: 1 .  triamterene-hydrochlorothiazide (MAXZIDE-25) 37.5-25 MG tablet, TAKE 1/2 TABLET BY MOUTH ONCE DAILY, Disp: 90 tablet, Rfl: 1 .  VENTOLIN HFA 108 (90 Base) MCG/ACT inhaler, Inhale 1 puff into the lungs every 6 (six) hours as needed for wheezing or shortness of breath., Disp: 1 Inhaler, Rfl: 6  EXAM:  VITALS per patient if applicable:  GENERAL: alert, oriented, appears well and in no acute distress  HEENT: atraumatic, conjunttiva clear, no obvious abnormalities on inspection of external nose and ears  NECK: normal movements of the head and neck  LUNGS: on inspection no signs of respiratory distress, breathing rate appears normal, no obvious gross SOB, gasping or wheezing  CV: no obvious cyanosis  MS: moves all visible extremities without noticeable abnormality  PSYCH/NEURO: pleasant and cooperative, no obvious depression or anxiety, speech and thought processing grossly intact  ASSESSMENT AND PLAN:  Discussed the following assessment and plan:  Back pain at L4-L5  level  Back pain at L4-L5 level Episodic Appears to be muscle strain .  meloxicam used in the past  With good results    I discussed the assessment and treatment plan with the patient. The patient was provided an opportunity to ask questions and all were answered. The patient agreed with the plan and demonstrated an understanding of the instructions.   The patient was advised to call back or seek an in-person evaluation if the symptoms worsen or if the condition fails to improve as anticipated.  I provided 25 minutes of non-face-to-face time during this encounter.   [name], MD

## 2018-09-12 NOTE — Patient Instructions (Signed)
You can use one Aleve every 12 hours if needed for joint/muscle pain,  Or 2 ibuprofen every 8 hours   You can add up to 2000 mg of acetominophen (tylenol) every day safely  In divided doses (500 mg every 6 hours  Or 1000 mg every 12 hours.)   I encourage you to walk EVERY DAY for at least 15 minutes to keep your leg muscles strong and TO GET FRESH AIR   See you in 6 months

## 2018-09-12 NOTE — Progress Notes (Addendum)
Subjective:   Whitney Molina is a 78 y.o. female who presents for Medicare Annual (Subsequent) preventive examination.  Review of Systems:  No ROS.  Medicare Wellness Virtual Visit. UTA vital signs.  Additional risk factors are reflected in the social history.   Cardiac Risk Factors include: advanced age (>2men, >62 women);hypertension     Objective:     Vitals: There were no vitals taken for this visit.  There is no height or weight on file to calculate BMI.  Advanced Directives 09/12/2018 07/12/2018 01/10/2018 10/12/2017 09/23/2017 09/10/2017 07/12/2017  Does Patient Have a Medical Advance Directive? No No No No No No No  Would patient like information on creating a medical advance directive? No - Patient declined No - Patient declined No - Patient declined No - Patient declined No - Patient declined No - Patient declined No - Patient declined    Tobacco Social History   Tobacco Use  Smoking Status Never Smoker  Smokeless Tobacco Never Used     Counseling given: Not Answered   Clinical Intake:  Pre-visit preparation completed: Yes           How often do you need to have someone help you when you read instructions, pamphlets, or other written materials from your doctor or pharmacy?: 1 - Never  Interpreter Needed?: No     Past Medical History:  Diagnosis Date  . Basal cell carcinoma of nose   . Bronchitis   . GERD (gastroesophageal reflux disease)   . Heart murmur   . Hemorrhoids   . History of basal cell carcinoma    right upper lip, right ant shoulder  . History of colon polyps   . History of dysplastic nevus 12/05/2001   left paraspinal upper back, upper back spinal  . History of kidney stones   . History of mammogram 2016  . History of squamous cell carcinoma 02/07/2005   left ant chest  . Hyperlipidemia   . Hypertension   . Non Hodgkin's lymphoma ([place])   . 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 ABDOMINAL HYSTERECTOMY  1984  . BLADDER SUSPENSION  2010  . CHOLECYSTECTOMY  06-01-03  . COLONOSCOPY  2003  . LITHOTRIPSY  2005  . local exicsion skin cancer  2004   squamous cell of chest wall. left chest  . OOPHORECTOMY    . TONSILLECTOMY  1971  . TUBAL LIGATION  1975  . VAGINAL PROLAPSE REPAIR  July 2001   Pelvic Prolapse   Family History  Problem Relation Age of Onset  . Stroke Mother   . Breast cancer Mother 23  . Stroke Father   . Dementia Father   . Cancer Brother        bladder  . Stroke Brother   . Breast cancer Paternal Aunt 104  . Bipolar disorder Son    Social History   Socioeconomic History  . Marital status: Married    Spouse name: Not on file  . Number of children: Not on file  . Years of education: Not on file  . Highest education level: Not on file  Occupational History  . Not on file  Social Needs  . Financial resource strain: Not hard at all  . Food insecurity:    Worry: Never true    Inability: Never true  . Transportation needs:    Medical: No    Non-medical: No  Tobacco Use  . Smoking status: Never Smoker  . Smokeless tobacco: Never Used  Substance and Sexual Activity  . Alcohol use: No  . Drug use: No  . Sexual activity: Yes  Lifestyle  . Physical activity:    Days per week: 4 days    Minutes per session: 20 min  . Stress: Not at all  Relationships  . Social connections:    Talks on phone: Not on file    Gets together: Not on file    Attends religious service: Not on file    Active member of club or organization: Not on file    Attends meetings of clubs or organizations: Not on file    Relationship status: Not on file  Other Topics Concern  . Not on file  Social History Narrative  . Not on file    Outpatient Encounter Medications as of 09/12/2018  Medication Sig  . Cholecalciferol (VITAMIN D3) 1000 units CAPS Take 1,000 Units by mouth daily.  Marland Kitchen docusate sodium (COLACE) 100 MG  capsule Take 100 mg by mouth daily.  Marland Kitchen escitalopram (LEXAPRO) 10 MG tablet TAKE 1 TABLET BY MOUTH ONCE DAILY.  . hyoscyamine (LEVBID) 0.375 MG 12 hr tablet TAKE 1 TABLET BY MOUTH ONCE DAILY.  Marland Kitchen losartan (COZAAR) 100 MG tablet TAKE 1 TABLET BY MOUTH ONCE DAILY *NEEDSAN APPOINTMENT*  . metoprolol succinate (TOPROL-XL) 25 MG 24 hr tablet TAKE 1 TABLET BY MOUTH ONCE DAILY  . potassium chloride SA (K-DUR,KLOR-CON) 20 MEQ tablet TAKE 1 TABLET BY MOUTH ONCE DAILY  . Probiotic Product (HEALTHY COLON PO) Take 1 capsule by mouth daily.  . simvastatin (ZOCOR) 40 MG tablet TAKE 1 TABLET EACH EVENING  . triamterene-hydrochlorothiazide (MAXZIDE-25) 37.5-25 MG tablet TAKE 1/2 TABLET BY MOUTH ONCE DAILY  . VENTOLIN HFA 108 (90 Base) MCG/ACT inhaler Inhale 1 puff into the lungs every 6 (six) hours as needed for wheezing or shortness of breath.   No facility-administered encounter medications on file as of 09/12/2018.     Activities of Daily Living In your present state of health, do you have any difficulty performing the following activities: 09/12/2018 09/23/2017  Hearing? N N  Vision? N N  Difficulty concentrating or making decisions? N N  Walking or climbing stairs? N N  Dressing or bathing? N N  Doing errands, shopping? N Y  Conservation officer, nature and eating ? N -  Using the Toilet? N -  In the past six months, have you accidently leaked urine? N -  Do you have problems with loss of bowel control? N -  Managing your Medications? N -  Managing your Finances? N -  Housekeeping or managing your Housekeeping? N -  Some recent data might be hidden    Patient Care Team: [name], MD as PCP - General (Internal Medicine) Bary Castilla Forest Gleason, MD (General Surgery)    Assessment:   This is a routine wellness examination for Whitney Molina.  I connected with patient 09/12/18 at 10:00 AM EDT by enabled telemedicine application and verified that I am speaking with the correct person using two identifiers. Patient  stated full name and DOB. Patient gave permission to continue with virtual visit. Patient's location was at home and Nurse's location was at [place] office. Patient having video technical difficulties with camera. Continued and completed visit audio only.   Health Screenings  Mammogram - 07/11/18 Colonoscopy - 06/11/18 Bone Density - 03/26/17 Glaucoma -none Hearing -demonstrates normal hearing during visit. Hemoglobin A1C - 04/01/18 (5.9) Cholesterol -  04/01/18 (227) Dental- UTD  Social  Alcohol intake - no     Smoking history- never Smokers in home? none Illicit drug use? none Exercise - stretches, walking Diet - regular Sexually Active -yes BMI- discussed the importance of a healthy diet, water intake and the benefits of aerobic exercise.  Educational material provided.   Safety  Patient feels safe at home- yes Patient does have smoke detectors at home- yes Patient does wear sunscreen or protective clothing when in direct sunlight -yes Patient does wear seat belt when in a moving vehicle -yes  Activities of Daily Living Patient can do their own household chores. Denies needing assistance with: driving, feeding themselves, getting from bed to chair, getting to the toilet, bathing/showering, dressing, managing money, or preparing meals.  No new identified risk were noted.    Depression Screen Patient denies losing interest in daily life, feeling hopeless, or crying easily over simple problems.   Medication-taking as directed and without issues.   Fall Screen Patient denies being afraid of falling or falling in the last year.   Memory Screen Patient denies problems with memory, misplacing items, and is able to balance checkbook/bank accounts.  Patient is alert, normal appearance, oriented to person/place/and time. Correctly identified the president of the [place] and recall of 2/3 objects. 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   Immunizations The following Immunizations were discussed: Influenza, shingles, pneumonia, and tetanus.   Other Providers Patient Care Team: [name], MD as PCP - General (Internal Medicine) Bary Castilla Forest Gleason, MD (General Surgery)  Exercise Activities and Dietary recommendations Current Exercise Habits: Home exercise routine, Type of exercise: walking;stretching, Intensity: Mild  Goals      Patient Stated   . Increase physical activity (pt-stated)     Walk more for exercise       Fall Risk Fall Risk  09/12/2018 09/10/2017 01/23/2017 09/04/2016 09/03/2015  Falls in the past year? 0 No No No No   Depression Screen PHQ 2/9 Scores 09/12/2018 09/10/2017 01/23/2017 09/04/2016  PHQ - 2 Score 0 0 0 0     Cognitive Function MMSE - Mini Mental State Exam 09/10/2017 09/04/2016 05/20/2015  Orientation to time 5 5 5   Orientation to Place 5 5 5   Registration 3 3 3   Attention/ Calculation 5 5 5   Recall 3 2 3   Language- name 2 objects 2 2 2   Language- repeat 1 1 1   Language- follow 3 step command 3 3 3   Language- read & follow direction 1 1 1   Write a sentence 1 1 1   Copy design 1 1 1   Total score 30 29 30      6CIT Screen 09/12/2018  What Year? 0 points  What month? 0 points  What time? 0 points  Count back from 20 0 points  Months in reverse 0 points  Repeat phrase 0 points  Total Score 0    Immunization History  Administered Date(s) Administered  . Influenza Split 01/16/2013, 02/26/2014  . Influenza, High Dose Seasonal PF 01/10/2016, 02/19/2017, 03/08/2018  . Influenza,inj,Quad PF,6+ Mos 04/21/2015  . Pneumococcal Conjugate-13 06/24/2014  . Pneumococcal Polysaccharide-23 04/01/2018  . Pneumococcal-Unspecified 05/16/2011  . Td 11/12/2012  . Tdap 11/21/2012  . Zoster 02/23/2015   Screening Tests Health Maintenance  Topic Date Due  . INFLUENZA VACCINE  12/14/2018  . MAMMOGRAM   07/12/2019  . TETANUS/TDAP  11/22/2022  .  DEXA SCAN  Completed  . PNA vac Low Risk Adult  Completed      Plan:    End of life planning; Advanced aging; Advanced directives discussed.  No HCPOA/Living Will.  Additional information declined at this time.  I have personally reviewed and noted the following in the patient's chart:   . Medical and social history . Use of alcohol, tobacco or illicit drugs  . Current medications and supplements . Functional ability and status . Nutritional status . Physical activity . Advanced directives . List of other physicians . Hospitalizations, surgeries, and ER visits in previous 12 months . Vitals . Screenings to include cognitive, depression, and falls . Referrals and appointments  In addition, I have reviewed and discussed with patient certain preventive protocols, quality metrics, and best practice recommendations. A written personalized care plan for preventive services as well as general preventive health recommendations were provided to patient.     OBrien-Blaney, Wilburta Milbourn L, LPN  11/18/8673    I have reviewed the above information and agree with above.   Deborra Medina, MD

## 2018-09-15 NOTE — Assessment & Plan Note (Signed)
Episodic Appears to be muscle strain .  meloxicam used in the past  With good results

## 2018-10-15 ENCOUNTER — Other Ambulatory Visit: Payer: Self-pay | Admitting: Internal Medicine

## 2018-10-30 ENCOUNTER — Other Ambulatory Visit: Payer: Self-pay | Admitting: Internal Medicine

## 2018-11-18 ENCOUNTER — Telehealth: Payer: Self-pay | Admitting: *Deleted

## 2018-11-18 NOTE — Telephone Encounter (Signed)
Copied from [place] [id number]. Topic: Appointment Scheduling - Scheduling Inquiry for Clinic >> Nov 15, 2018  8:45 AM Berneta Levins wrote: Reason for CRM:  Pt calling requesting an appointment to have her urine checked.  Advised pt that office was closed, she states she would like a call on Monday.

## 2018-11-18 NOTE — Telephone Encounter (Signed)
Called and spoke to patient.  Patient said that she had bloody discharge on Thursday after doing yard work but knows that she has a kidney stone and feels that it may have moved.  Patient said that she has not had any bloody discharge since Thursday and denies having any other symptoms.  Patient said that she sees Dr. Yves Dill for kidney stones and was told by him that she had one.  Patient did not want to schedule an appointment and said that she would prefer to wait and would call the office back if she needs an appointment.  Informed patient that notes will be forwarded to PCP for review.

## 2018-11-18 NOTE — Telephone Encounter (Signed)
FYI

## 2019-01-08 ENCOUNTER — Other Ambulatory Visit: Payer: Self-pay

## 2019-01-08 DIAGNOSIS — C8213 Follicular lymphoma grade II, intra-abdominal lymph nodes: Secondary | ICD-10-CM

## 2019-01-09 ENCOUNTER — Other Ambulatory Visit: Payer: Self-pay | Admitting: Internal Medicine

## 2019-01-10 ENCOUNTER — Other Ambulatory Visit: Payer: Self-pay

## 2019-01-10 ENCOUNTER — Inpatient Hospital Stay (HOSPITAL_BASED_OUTPATIENT_CLINIC_OR_DEPARTMENT_OTHER): Payer: Medicare Other | Admitting: Internal Medicine

## 2019-01-10 ENCOUNTER — Inpatient Hospital Stay: Payer: Medicare Other | Attending: Internal Medicine

## 2019-01-10 ENCOUNTER — Encounter: Payer: Self-pay | Admitting: Internal Medicine

## 2019-01-10 DIAGNOSIS — Z79899 Other long term (current) drug therapy: Secondary | ICD-10-CM | POA: Insufficient documentation

## 2019-01-10 DIAGNOSIS — Z9221 Personal history of antineoplastic chemotherapy: Secondary | ICD-10-CM | POA: Diagnosis not present

## 2019-01-10 DIAGNOSIS — Z85828 Personal history of other malignant neoplasm of skin: Secondary | ICD-10-CM | POA: Insufficient documentation

## 2019-01-10 DIAGNOSIS — C8213 Follicular lymphoma grade II, intra-abdominal lymph nodes: Secondary | ICD-10-CM | POA: Diagnosis not present

## 2019-01-10 DIAGNOSIS — E785 Hyperlipidemia, unspecified: Secondary | ICD-10-CM | POA: Insufficient documentation

## 2019-01-10 DIAGNOSIS — Z8572 Personal history of non-Hodgkin lymphomas: Secondary | ICD-10-CM | POA: Insufficient documentation

## 2019-01-10 DIAGNOSIS — Z803 Family history of malignant neoplasm of breast: Secondary | ICD-10-CM | POA: Diagnosis not present

## 2019-01-10 DIAGNOSIS — Z86010 Personal history of colonic polyps: Secondary | ICD-10-CM | POA: Diagnosis not present

## 2019-01-10 DIAGNOSIS — Z8052 Family history of malignant neoplasm of bladder: Secondary | ICD-10-CM | POA: Insufficient documentation

## 2019-01-10 DIAGNOSIS — I10 Essential (primary) hypertension: Secondary | ICD-10-CM | POA: Insufficient documentation

## 2019-01-10 DIAGNOSIS — K219 Gastro-esophageal reflux disease without esophagitis: Secondary | ICD-10-CM | POA: Insufficient documentation

## 2019-01-10 LAB — COMPREHENSIVE METABOLIC PANEL
ALT: 19 U/L (ref 0–44)
AST: 22 U/L (ref 15–41)
Albumin: 4.4 g/dL (ref 3.5–5.0)
Alkaline Phosphatase: 70 U/L (ref 38–126)
Anion gap: 8 (ref 5–15)
BUN: 12 mg/dL (ref 8–23)
CO2: 27 mmol/L (ref 22–32)
Calcium: 9.2 mg/dL (ref 8.9–10.3)
Chloride: 102 mmol/L (ref 98–111)
Creatinine, Ser: 1.07 mg/dL — ABNORMAL HIGH (ref 0.44–1.00)
GFR calc Af Amer: 58 mL/min — ABNORMAL LOW (ref >60)
GFR calc non Af Amer: 50 mL/min — ABNORMAL LOW (ref >60)
Glucose, Bld: 102 mg/dL — ABNORMAL HIGH (ref 70–99)
Potassium: 3.7 mmol/L (ref 3.5–5.1)
Sodium: 137 mmol/L (ref 135–145)
Total Bilirubin: 0.9 mg/dL (ref 0.3–1.2)
Total Protein: 8.2 g/dL — ABNORMAL HIGH (ref 6.5–8.1)

## 2019-01-10 LAB — CBC WITH DIFFERENTIAL/PLATELET
Abs Immature Granulocytes: 0.02 10*3/uL (ref 0.00–0.07)
Basophils Absolute: 0 10*3/uL (ref 0.0–0.1)
Basophils Relative: 1 %
Eosinophils Absolute: 0.2 10*3/uL (ref 0.0–0.5)
Eosinophils Relative: 4 %
HCT: 38.1 % (ref 36.0–46.0)
Hemoglobin: 13.1 g/dL (ref 12.0–15.0)
Immature Granulocytes: 0 %
Lymphocytes Relative: 32 %
Lymphs Abs: 2.1 10*3/uL (ref 0.7–4.0)
MCH: 31.8 pg (ref 26.0–34.0)
MCHC: 34.4 g/dL (ref 30.0–36.0)
MCV: 92.5 fL (ref 80.0–100.0)
Monocytes Absolute: 0.6 10*3/uL (ref 0.1–1.0)
Monocytes Relative: 9 %
Neutro Abs: 3.5 10*3/uL (ref 1.7–7.7)
Neutrophils Relative %: 54 %
Platelets: 219 10*3/uL (ref 150–400)
RBC: 4.12 MIL/uL (ref 3.87–5.11)
RDW: 12.5 % (ref 11.5–15.5)
WBC: 6.5 10*3/uL (ref 4.0–10.5)
nRBC: 0 % (ref 0.0–0.2)

## 2019-01-10 LAB — LACTATE DEHYDROGENASE: LDH: 161 U/L (ref 98–192)

## 2019-01-10 NOTE — Progress Notes (Signed)
[facility] [facility] OFFICE PROGRESS NOTE  Patient Care Team: [name], MD as PCP - General (Internal Medicine) Bary Castilla Forest Gleason, MD (General Surgery)  Cancer Staging No matching staging information was found for the patient.   Oncology History Overview Note   # NOV [id number]- FOLLICULAR LYMPHOMA G-2; [s/p Bx- RETROPERITONEAL/LEFT Peri-aortic LN ~ 2.5CM [incidental on CT scan in 2016; CT- Nov 2014- ~1CM]/ PET 2016-Left Peri-aortic LN; Suv ~15; Ext Iliac LN 6 mm; Feb 2017- Unchanged LN; START Rituxan q week x4 [finished march 15th]; PET JAN 30th 2018- NED.   # Hx of Shingles [Sep 2016]-   # Kidney cysts [previous Dr.Wolfe]  DIAGNOSIS: Follicle lymphoma-G-2  STAGE: Stage II       ;GOALS: Control  CURRENT/MOST RECENT THERAPY: Surveillance    Follicular lymphoma grade ii, intra-abdominal lymph nodes ([place])      INTERVAL HISTORY:  Whitney Molina 78 y.o.  female pleasant patient above history of stage II follicular lymphoma-grade 2 is here for follow-up.  Patient denies any new lumps or bumps.  Appetite is good.  No weight loss.  No nausea no vomiting.  No night sweats.  Review of Systems  Constitutional: Negative for chills, diaphoresis, fever, malaise/fatigue and weight loss.  HENT: Negative for nosebleeds and sore throat.   Eyes: Negative for double vision.  Respiratory: Negative for cough, hemoptysis, sputum production, shortness of breath and wheezing.   Cardiovascular: Negative for chest pain, palpitations, orthopnea and leg swelling.  Gastrointestinal: Negative for abdominal pain, blood in stool, constipation, diarrhea, heartburn, melena, nausea and vomiting.  Genitourinary: Negative for dysuria, frequency and urgency.  Musculoskeletal: Negative for back pain and joint pain.  Skin: Negative.  Negative for itching and rash.  Neurological: Negative for dizziness, tingling, focal weakness, weakness and headaches.  Endo/Heme/Allergies: Does not bruise/bleed  easily.  Psychiatric/Behavioral: Negative for depression. The patient is not nervous/anxious and does not have insomnia.       PAST MEDICAL HISTORY :  Past Medical History:  Diagnosis Date  . Basal cell carcinoma of nose   . Bronchitis   . GERD (gastroesophageal reflux disease)   . Heart murmur   . Hemorrhoids   . History of basal cell carcinoma    right upper lip, right ant shoulder  . History of colon polyps   . History of dysplastic nevus 12/05/2001   left paraspinal upper back, upper back spinal  . History of kidney stones   . History of mammogram 2016  . History of squamous cell carcinoma 02/07/2005   left ant chest  . Hyperlipidemia   . Hypertension   . Non Hodgkin's lymphoma ([place])   . 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 ABDOMINAL HYSTERECTOMY  1984  . BLADDER SUSPENSION  2010  . CHOLECYSTECTOMY  06-01-03  . COLONOSCOPY  2003  . LITHOTRIPSY  2005  . local exicsion skin cancer  2004   squamous cell of chest wall. left chest  . OOPHORECTOMY    . TONSILLECTOMY  1971  . TUBAL LIGATION  1975  . VAGINAL PROLAPSE REPAIR  July 2001   Pelvic Prolapse    FAMILY HISTORY :   Family History  Problem Relation Age of Onset  . Stroke Mother   . Breast cancer Mother 73  . Stroke Father   . Dementia Father   . Cancer Brother  bladder  . Stroke Brother   . Breast cancer Paternal Aunt 65  . Bipolar disorder Son     SOCIAL HISTORY:   Social History   Tobacco Use  . Smoking status: Never Smoker  . Smokeless tobacco: Never Used  Substance Use Topics  . Alcohol use: No  . Drug use: No    ALLERGIES:  has No Known Allergies.  MEDICATIONS:  Current Outpatient Medications  Medication Sig Dispense Refill  . Cholecalciferol (VITAMIN D3) 1000 units CAPS Take 1,000 Units by mouth daily.    Marland Kitchen docusate sodium (COLACE) 100 MG capsule Take 100 mg  by mouth daily.    Marland Kitchen escitalopram (LEXAPRO) 10 MG tablet TAKE 1 TABLET BY MOUTH ONCE DAILY 90 tablet 1  . hyoscyamine (LEVBID) 0.375 MG 12 hr tablet TAKE 1 TABLET BY MOUTH ONCE DAILY. 90 tablet 1  . losartan (COZAAR) 100 MG tablet TAKE 1 TABLET BY MOUTH ONCE DAILY *NEEDSAN APPOINTMENT* 90 tablet 1  . metoprolol succinate (TOPROL-XL) 25 MG 24 hr tablet TAKE 1 TABLET BY MOUTH ONCE DAILY 90 tablet 3  . potassium chloride SA (K-DUR,KLOR-CON) 20 MEQ tablet TAKE 1 TABLET BY MOUTH ONCE DAILY 90 tablet 1  . Probiotic Product (HEALTHY COLON PO) Take 1 capsule by mouth daily.    . simvastatin (ZOCOR) 40 MG tablet TAKE 1 TABLET BY MOUTH ONCE EVERY EVENING 90 tablet 1  . triamterene-hydrochlorothiazide (MAXZIDE-25) 37.5-25 MG tablet TAKE 1/2 TABLET BY MOUTH ONCE DAILY 90 tablet 1  . VENTOLIN HFA 108 (90 Base) MCG/ACT inhaler Inhale 1 puff into the lungs every 6 (six) hours as needed for wheezing or shortness of breath. (Patient not taking: Reported on 01/10/2019) 1 Inhaler 6   No current facility-administered medications for this visit.     PHYSICAL EXAMINATION: ECOG PERFORMANCE STATUS: 0 - Asymptomatic  BP 138/84   Pulse 66   Temp (!) 97.5 F (36.4 C) (Tympanic)   Resp 20   Ht 5' 3.5" (1.613 m)   Wt 144 lb 4.8 oz (65.5 kg)   BMI 25.16 kg/m   Filed Weights   01/10/19 1433  Weight: 144 lb 4.8 oz (65.5 kg)    Physical Exam  Constitutional: She is oriented to person, place, and time and well-developed, well-nourished, and in no distress.  Alone.  Walking myself.  HENT:  Head: Normocephalic and atraumatic.  Mouth/Throat: Oropharynx is clear and moist. No oropharyngeal exudate.  Eyes: Pupils are equal, round, and reactive to light.  Neck: Normal range of motion. Neck supple.  Cardiovascular: Normal rate and regular rhythm.  Pulmonary/Chest: No respiratory distress. She has no wheezes.  Abdominal: Soft. Bowel sounds are normal. She exhibits no distension and no mass. There is no abdominal  tenderness. There is no rebound and no guarding.  Musculoskeletal: Normal range of motion.        General: No tenderness or edema.  Neurological: She is alert and oriented to person, place, and time.  Skin: Skin is warm.  Psychiatric: Affect normal.    LABORATORY DATA:  I have reviewed the data as listed    Component Value Date/Time   NA 137 01/10/2019 1357   NA 138 09/10/2013 0758   K 3.7 01/10/2019 1357   K 3.3 (L) 09/10/2013 0758   CL 102 01/10/2019 1357   CL 100 09/10/2013 0758   CO2 27 01/10/2019 1357   CO2 32 09/10/2013 0758   GLUCOSE 102 (H) 01/10/2019 1357   GLUCOSE 85 09/10/2013 0758   BUN 12 01/10/2019 1357  BUN 13 09/10/2013 0758   CREATININE 1.07 (H) 01/10/2019 1357   CREATININE 0.96 12/22/2013 0857   CALCIUM 9.2 01/10/2019 1357   CALCIUM 9.2 09/10/2013 0758   PROT 8.2 (H) 01/10/2019 1357   PROT 8.3 (H) 09/10/2013 0758   ALBUMIN 4.4 01/10/2019 1357   ALBUMIN 3.5 09/10/2013 0758   AST 22 01/10/2019 1357   AST 23 09/10/2013 0758   ALT 19 01/10/2019 1357   ALT 28 09/10/2013 0758   ALKPHOS 70 01/10/2019 1357   ALKPHOS 78 09/10/2013 0758   BILITOT 0.9 01/10/2019 1357   BILITOT 0.6 09/10/2013 0758   GFRNONAA 50 (L) 01/10/2019 1357   GFRNONAA 59 (L) 12/22/2013 0857   GFRAA 58 (L) 01/10/2019 1357   GFRAA 68 12/22/2013 0857    No results found for: SPEP, UPEP  Lab Results  Component Value Date   WBC 6.5 01/10/2019   NEUTROABS 3.5 01/10/2019   HGB 13.1 01/10/2019   HCT 38.1 01/10/2019   MCV 92.5 01/10/2019   PLT 219 01/10/2019      Chemistry      Component Value Date/Time   NA 137 01/10/2019 1357   NA 138 09/10/2013 0758   K 3.7 01/10/2019 1357   K 3.3 (L) 09/10/2013 0758   CL 102 01/10/2019 1357   CL 100 09/10/2013 0758   CO2 27 01/10/2019 1357   CO2 32 09/10/2013 0758   BUN 12 01/10/2019 1357   BUN 13 09/10/2013 0758   CREATININE 1.07 (H) 01/10/2019 1357   CREATININE 0.96 12/22/2013 0857      Component Value Date/Time   CALCIUM 9.2  01/10/2019 1357   CALCIUM 9.2 09/10/2013 0758   ALKPHOS 70 01/10/2019 1357   ALKPHOS 78 09/10/2013 0758   AST 22 01/10/2019 1357   AST 23 09/10/2013 0758   ALT 19 01/10/2019 1357   ALT 28 09/10/2013 0758   BILITOT 0.9 01/10/2019 1357   BILITOT 0.6 09/10/2013 0758       RADIOGRAPHIC STUDIES: I have personally reviewed the radiological images as listed and agreed with the findings in the report. No results found.   ASSESSMENT & PLAN:  Follicular lymphoma grade ii, intra-abdominal lymph nodes ([place]) # RETROPERITONEAL LN [incidental/asymptomatic]- follicular lymphoma grade 2; likely stage II; status post rituximab. FEB 2019 PET scan NED.    #Clinically no evidence recurrence.  STABLE; will get scans on clinical basis.  # cancer screening- [place] in Jan 2020 [Dr.Elliot]; Mammo- feb 2020-Normal.  # DISPOSITION: # follow up in 6 months-MD/ labs-cbc/cmp,ldh-Dr.B  Cc: Dr.Tullo   No orders of the defined types were placed in this encounter.  All questions were answered. The patient knows to call the clinic with any problems, questions or concerns.      Cammie Sickle, MD 01/10/2019 3:43 PM

## 2019-01-10 NOTE — Assessment & Plan Note (Addendum)
#   RETROPERITONEAL LN [incidental/asymptomatic]- follicular lymphoma grade 2; likely stage II; status post rituximab. FEB 2019 PET scan NED.    #Clinically no evidence recurrence.  STABLE; will get scans on clinical basis.  # cancer screening- [place] in Jan 2020 [Dr.Elliot]; Mammo- feb 2020-Normal.  # DISPOSITION: # follow up in 6 months-MD/ labs-cbc/cmp,ldh-Dr.B  Cc: Dr.Tullo

## 2019-01-21 ENCOUNTER — Other Ambulatory Visit: Payer: Self-pay | Admitting: Internal Medicine

## 2019-02-13 ENCOUNTER — Ambulatory Visit (INDEPENDENT_AMBULATORY_CARE_PROVIDER_SITE_OTHER): Payer: Medicare Other

## 2019-02-13 ENCOUNTER — Other Ambulatory Visit: Payer: Self-pay

## 2019-02-13 DIAGNOSIS — Z23 Encounter for immunization: Secondary | ICD-10-CM | POA: Diagnosis not present

## 2019-02-18 ENCOUNTER — Other Ambulatory Visit: Payer: Self-pay | Admitting: Internal Medicine

## 2019-03-03 DIAGNOSIS — L578 Other skin changes due to chronic exposure to nonionizing radiation: Secondary | ICD-10-CM | POA: Diagnosis not present

## 2019-03-03 DIAGNOSIS — D1800 Hemangioma unspecified site: Secondary | ICD-10-CM | POA: Diagnosis not present

## 2019-03-03 DIAGNOSIS — D485 Neoplasm of uncertain behavior of skin: Secondary | ICD-10-CM | POA: Diagnosis not present

## 2019-03-03 DIAGNOSIS — L821 Other seborrheic keratosis: Secondary | ICD-10-CM | POA: Diagnosis not present

## 2019-03-03 DIAGNOSIS — L814 Other melanin hyperpigmentation: Secondary | ICD-10-CM | POA: Diagnosis not present

## 2019-03-03 DIAGNOSIS — Z1283 Encounter for screening for malignant neoplasm of skin: Secondary | ICD-10-CM | POA: Diagnosis not present

## 2019-03-03 DIAGNOSIS — Z85828 Personal history of other malignant neoplasm of skin: Secondary | ICD-10-CM | POA: Diagnosis not present

## 2019-03-03 DIAGNOSIS — L720 Epidermal cyst: Secondary | ICD-10-CM | POA: Diagnosis not present

## 2019-03-03 DIAGNOSIS — L820 Inflamed seborrheic keratosis: Secondary | ICD-10-CM | POA: Diagnosis not present

## 2019-03-11 ENCOUNTER — Ambulatory Visit (INDEPENDENT_AMBULATORY_CARE_PROVIDER_SITE_OTHER): Payer: Medicare Other | Admitting: Family Medicine

## 2019-03-11 ENCOUNTER — Encounter: Payer: Self-pay | Admitting: Family Medicine

## 2019-03-11 ENCOUNTER — Other Ambulatory Visit: Payer: Self-pay

## 2019-03-11 VITALS — BP 156/80 | HR 62 | Temp 96.9°F | Resp 16 | Ht 63.5 in | Wt 152.4 lb

## 2019-03-11 DIAGNOSIS — Z87442 Personal history of urinary calculi: Secondary | ICD-10-CM

## 2019-03-11 DIAGNOSIS — R319 Hematuria, unspecified: Secondary | ICD-10-CM

## 2019-03-11 LAB — POCT URINALYSIS DIPSTICK
Bilirubin, UA: NEGATIVE
Blood, UA: POSITIVE
Glucose, UA: NEGATIVE
Ketones, UA: NEGATIVE
Leukocytes, UA: NEGATIVE
Nitrite, UA: NEGATIVE
Protein, UA: POSITIVE — AB
Spec Grav, UA: 1.015 (ref 1.010–1.025)
Urobilinogen, UA: NEGATIVE E.U./dL — AB
pH, UA: 6.5 (ref 5.0–8.0)

## 2019-03-11 LAB — CBC
HCT: 37.4 % (ref 36.0–46.0)
Hemoglobin: 12.8 g/dL (ref 12.0–15.0)
MCHC: 34.3 g/dL (ref 30.0–36.0)
MCV: 94.6 fl (ref 78.0–100.0)
Platelets: 219 10*3/uL (ref 150.0–400.0)
RBC: 3.96 Mil/uL (ref 3.87–5.11)
RDW: 13.2 % (ref 11.5–15.5)
WBC: 7.2 10*3/uL (ref 4.0–10.5)

## 2019-03-11 LAB — BASIC METABOLIC PANEL
BUN: 12 mg/dL (ref 6–23)
CO2: 32 mEq/L (ref 19–32)
Calcium: 9.3 mg/dL (ref 8.4–10.5)
Chloride: 98 mEq/L (ref 96–112)
Creatinine, Ser: 0.87 mg/dL (ref 0.40–1.20)
GFR: 63.03 mL/min (ref >60.00)
Glucose, Bld: 93 mg/dL (ref 70–99)
Potassium: 3.9 mEq/L (ref 3.5–5.1)
Sodium: 134 mEq/L — ABNORMAL LOW (ref 135–145)

## 2019-03-11 LAB — URINALYSIS, MICROSCOPIC ONLY

## 2019-03-11 MED ORDER — CEPHALEXIN 250 MG PO CAPS: 250.0000 mg | ORAL_CAPSULE | Freq: Two times a day (BID) | ORAL | 0 refills | Status: DC

## 2019-03-11 NOTE — Progress Notes (Signed)
Subjective:    Patient ID: Whitney Molina, female    DOB: 26-Apr-1941, 77 y.o.   MRN: [id number]  HPI   Patient presents to clinic complaining of some blood in urine.  States first noticed it this morning.  Has had issues of blood in urine previously, and attributed this to strong history of kidney stones.  Patient states she has passed multiple kidney stones to her life and has had to have kidney stones blasted before as well.  Does report history of 6 mm stone in left kidney, last had this imaged and measured about 2 years ago.  Denies any pain with urination, pressure or burning.  Denies any flank pain.  Denies abdominal pain.  Denies fever or chills.  Denies nausea or vomiting.  States she overall feels well, just noticed the blood in her urine this morning.  Patient Active Problem List   Diagnosis Date Noted  . Positive colorectal cancer screening using Cologuard test 04/17/2018  . Statin intolerance 04/10/2018  . Hospital discharge follow-up 09/29/2017  . Allergic rhinitis 09/29/2017  . Prediabetes 02/19/2017  . COPD with chronic bronchitis ([place]) 01/23/2017  . Follicular lymphoma grade ii, intra-abdominal lymph nodes ([place]) 02/04/2016  . Back pain at L4-L5 level 01/11/2016  . Breast cancer screening 09/05/2015  . Numbness of left hand 09/05/2015  . Lymphoma ([place]) 03/02/2015  . Shingles 02/22/2015  . Diverticulitis of colon 02/22/2015  . Renal cyst 02/22/2015  . Insomnia 12/25/2014  . Epistaxis 07/20/2014  . Hyperlipidemia LDL goal <130 06/26/2014  . Postmenopausal atrophic vaginitis 06/26/2014  . Essential hypertension 06/24/2014  . Osteoporosis 02/05/2014  . Bronchiectasis  09/15/2013  . Colon cancer screening 10/24/2012   Social History   Tobacco Use  . Smoking status: Never Smoker  . Smokeless tobacco: Never Used  Substance Use Topics  . Alcohol use: No   Review of Systems  Constitutional: Negative for chills, fatigue and fever.  HENT: Negative for  congestion, ear pain, sinus pain and sore throat.   Eyes: Negative.   Respiratory: Negative for cough, shortness of breath and wheezing.   Cardiovascular: Negative for chest pain, palpitations and leg swelling.  Gastrointestinal: Negative for abdominal pain, diarrhea, nausea and vomiting.  Genitourinary: +blood in urine Musculoskeletal: Negative for arthralgias and myalgias.  Skin: Negative for color change, pallor and rash.  Neurological: Negative for syncope, light-headedness and headaches.  Psychiatric/Behavioral: The patient is not nervous/anxious.       Objective:   Physical Exam Vitals signs and nursing note reviewed.  Constitutional:      General: She is not in acute distress.    Appearance: She is not ill-appearing, toxic-appearing or diaphoretic.  Eyes:     General: No scleral icterus.    Extraocular Movements: Extraocular movements intact.     Conjunctiva/sclera: Conjunctivae normal.  Cardiovascular:     Rate and Rhythm: Normal rate and regular rhythm.     Heart sounds: Normal heart sounds.  Abdominal:     General: Bowel sounds are normal. There is no distension.     Palpations: Abdomen is soft.     Tenderness: There is no abdominal tenderness. There is no right CVA tenderness, left CVA tenderness, guarding or rebound.  Musculoskeletal:     Right lower leg: No edema.     Left lower leg: No edema.  Skin:    General: Skin is warm and dry.     Coloration: Skin is not jaundiced or pale.  Neurological:     Mental Status:  She is alert and oriented to person, place, and time.     Gait: Gait normal.  Psychiatric:        Mood and Affect: Mood normal.        Behavior: Behavior normal.    Today's Vitals   03/11/19 1106  BP: (!) 156/80  Pulse: 62  Resp: 16  Temp: (!) 96.9 F (36.1 C)  TempSrc: Temporal  SpO2: 99%  Weight: 152 lb 6.4 oz (69.1 kg)  Height: 5' 3.5" (1.613 m)   Body mass index is 26.57 kg/m.     Assessment & Plan:    Blood in urine/history of  renal stones-we will get CBC and BMP in clinic to investigate for any signs of infection, kidney functions.  She will take low-dose Keflex twice daily for 5 days to treat for possible UTI.  This does show positive protein and blood, we will send out for micro and culture as well.  Patient is agreeable to CT renal stone study due to history of kidney stones including a larger stone at 6 mm that was last measured 2 years ago.  Patient advised if pain worsens or any symptoms change/new ones develop to call office and let us know.  Patient aware she should hear about CT scan appointment in the next 1 to 2 days.

## 2019-03-13 ENCOUNTER — Ambulatory Visit: Payer: Medicare Other

## 2019-03-13 DIAGNOSIS — M1711 Unilateral primary osteoarthritis, right knee: Secondary | ICD-10-CM | POA: Diagnosis not present

## 2019-03-13 DIAGNOSIS — M25562 Pain in left knee: Secondary | ICD-10-CM | POA: Diagnosis not present

## 2019-03-13 DIAGNOSIS — M25462 Effusion, left knee: Secondary | ICD-10-CM | POA: Diagnosis not present

## 2019-03-13 DIAGNOSIS — G8929 Other chronic pain: Secondary | ICD-10-CM | POA: Diagnosis not present

## 2019-03-13 DIAGNOSIS — M76892 Other specified enthesopathies of left lower limb, excluding foot: Secondary | ICD-10-CM | POA: Diagnosis not present

## 2019-03-13 DIAGNOSIS — M7122 Synovial cyst of popliteal space [Baker], left knee: Secondary | ICD-10-CM | POA: Diagnosis not present

## 2019-03-13 LAB — URINE CULTURE
MICRO NUMBER:: 1034823
SPECIMEN QUALITY:: ADEQUATE

## 2019-03-14 ENCOUNTER — Ambulatory Visit
Admission: RE | Admit: 2019-03-14 | Discharge: 2019-03-14 | Disposition: A | Discharge status: Home / Self Care | Payer: Medicare Other | Source: Ambulatory Visit | Attending: Family Medicine | Admitting: Family Medicine

## 2019-03-14 ENCOUNTER — Other Ambulatory Visit: Payer: Self-pay

## 2019-03-14 DIAGNOSIS — R319 Hematuria, unspecified: Secondary | ICD-10-CM | POA: Diagnosis not present

## 2019-03-14 DIAGNOSIS — N200 Calculus of kidney: Secondary | ICD-10-CM | POA: Diagnosis not present

## 2019-03-14 DIAGNOSIS — Z87442 Personal history of urinary calculi: Secondary | ICD-10-CM | POA: Insufficient documentation

## 2019-03-19 DIAGNOSIS — N200 Calculus of kidney: Secondary | ICD-10-CM | POA: Diagnosis not present

## 2019-03-19 DIAGNOSIS — N23 Unspecified renal colic: Secondary | ICD-10-CM | POA: Diagnosis not present

## 2019-04-28 ENCOUNTER — Other Ambulatory Visit: Payer: Self-pay | Admitting: Internal Medicine

## 2019-04-29 ENCOUNTER — Other Ambulatory Visit: Payer: Self-pay | Admitting: Internal Medicine

## 2019-05-14 ENCOUNTER — Telehealth: Payer: Self-pay | Admitting: Internal Medicine

## 2019-05-14 ENCOUNTER — Other Ambulatory Visit: Payer: Self-pay | Admitting: Internal Medicine

## 2019-05-14 NOTE — Telephone Encounter (Signed)
error 

## 2019-05-20 ENCOUNTER — Other Ambulatory Visit: Payer: Self-pay | Admitting: Internal Medicine

## 2019-06-02 ENCOUNTER — Other Ambulatory Visit: Payer: Self-pay | Admitting: Internal Medicine

## 2019-07-04 ENCOUNTER — Inpatient Hospital Stay: Payer: Medicare Other

## 2019-07-04 ENCOUNTER — Inpatient Hospital Stay: Payer: Medicare Other | Admitting: Internal Medicine

## 2019-07-18 ENCOUNTER — Other Ambulatory Visit: Payer: Self-pay | Admitting: *Deleted

## 2019-07-18 DIAGNOSIS — C8213 Follicular lymphoma grade II, intra-abdominal lymph nodes: Secondary | ICD-10-CM

## 2019-07-21 ENCOUNTER — Inpatient Hospital Stay: Payer: Medicare Other | Attending: Internal Medicine

## 2019-07-21 ENCOUNTER — Inpatient Hospital Stay (HOSPITAL_BASED_OUTPATIENT_CLINIC_OR_DEPARTMENT_OTHER): Payer: Medicare Other | Admitting: Internal Medicine

## 2019-07-21 ENCOUNTER — Other Ambulatory Visit: Payer: Self-pay

## 2019-07-21 DIAGNOSIS — K219 Gastro-esophageal reflux disease without esophagitis: Secondary | ICD-10-CM | POA: Insufficient documentation

## 2019-07-21 DIAGNOSIS — I10 Essential (primary) hypertension: Secondary | ICD-10-CM | POA: Diagnosis not present

## 2019-07-21 DIAGNOSIS — Z803 Family history of malignant neoplasm of breast: Secondary | ICD-10-CM | POA: Insufficient documentation

## 2019-07-21 DIAGNOSIS — Z85820 Personal history of malignant melanoma of skin: Secondary | ICD-10-CM | POA: Diagnosis not present

## 2019-07-21 DIAGNOSIS — Z86010 Personal history of colonic polyps: Secondary | ICD-10-CM | POA: Diagnosis not present

## 2019-07-21 DIAGNOSIS — E785 Hyperlipidemia, unspecified: Secondary | ICD-10-CM | POA: Diagnosis not present

## 2019-07-21 DIAGNOSIS — C8213 Follicular lymphoma grade II, intra-abdominal lymph nodes: Secondary | ICD-10-CM

## 2019-07-21 DIAGNOSIS — Z79899 Other long term (current) drug therapy: Secondary | ICD-10-CM | POA: Diagnosis not present

## 2019-07-21 DIAGNOSIS — Z87442 Personal history of urinary calculi: Secondary | ICD-10-CM | POA: Insufficient documentation

## 2019-07-21 DIAGNOSIS — Z8572 Personal history of non-Hodgkin lymphomas: Secondary | ICD-10-CM | POA: Diagnosis not present

## 2019-07-21 LAB — COMPREHENSIVE METABOLIC PANEL
ALT: 21 U/L (ref 0–44)
AST: 20 U/L (ref 15–41)
Albumin: 4.3 g/dL (ref 3.5–5.0)
Alkaline Phosphatase: 62 U/L (ref 38–126)
Anion gap: 8 (ref 5–15)
BUN: 11 mg/dL (ref 8–23)
CO2: 27 mmol/L (ref 22–32)
Calcium: 8.9 mg/dL (ref 8.9–10.3)
Chloride: 101 mmol/L (ref 98–111)
Creatinine, Ser: 0.97 mg/dL (ref 0.44–1.00)
GFR calc Af Amer: >60 mL/min (ref >60)
GFR calc non Af Amer: 56 mL/min — ABNORMAL LOW (ref >60)
Glucose, Bld: 82 mg/dL (ref 70–99)
Potassium: 3.6 mmol/L (ref 3.5–5.1)
Sodium: 136 mmol/L (ref 135–145)
Total Bilirubin: 0.7 mg/dL (ref 0.3–1.2)
Total Protein: 7.8 g/dL (ref 6.5–8.1)

## 2019-07-21 LAB — CBC WITH DIFFERENTIAL/PLATELET
Abs Immature Granulocytes: 0.02 10*3/uL (ref 0.00–0.07)
Basophils Absolute: 0 10*3/uL (ref 0.0–0.1)
Basophils Relative: 1 %
Eosinophils Absolute: 0.2 10*3/uL (ref 0.0–0.5)
Eosinophils Relative: 2 %
HCT: 39.5 % (ref 36.0–46.0)
Hemoglobin: 13.1 g/dL (ref 12.0–15.0)
Immature Granulocytes: 0 %
Lymphocytes Relative: 32 %
Lymphs Abs: 2.7 10*3/uL (ref 0.7–4.0)
MCH: 31.6 pg (ref 26.0–34.0)
MCHC: 33.2 g/dL (ref 30.0–36.0)
MCV: 95.2 fL (ref 80.0–100.0)
Monocytes Absolute: 0.7 10*3/uL (ref 0.1–1.0)
Monocytes Relative: 9 %
Neutro Abs: 4.8 10*3/uL (ref 1.7–7.7)
Neutrophils Relative %: 56 %
Platelets: 234 10*3/uL (ref 150–400)
RBC: 4.15 MIL/uL (ref 3.87–5.11)
RDW: 12.3 % (ref 11.5–15.5)
WBC: 8.5 10*3/uL (ref 4.0–10.5)
nRBC: 0 % (ref 0.0–0.2)

## 2019-07-21 LAB — LACTATE DEHYDROGENASE: LDH: 163 U/L (ref 98–192)

## 2019-07-21 NOTE — Assessment & Plan Note (Addendum)
#   RETROPERITONEAL LN [incidental/asymptomatic]- follicular lymphoma grade 2; likely stage II; status post rituximab. FEB 2019 PET scan NED.    #Clinically no evidence recurrence.  stable.; will get scans on clinical basis.  # cancer screening- colo in Jan 2020 [Dr.Elliot]; Mammo- feb 2020-Normal.  # DISPOSITION: # follow up in 6 months-MD/ labs-cbc/cmp,ldh-Dr.B  Cc: Dr.Tullo

## 2019-07-21 NOTE — Progress Notes (Signed)
[place] OFFICE PROGRESS NOTE  Patient Care Team: [name], MD as PCP - General (Internal Medicine) Bary Castilla Forest Gleason, MD (General Surgery)  Cancer Staging No matching staging information was found for the patient.   Oncology History Overview Note   # NOV [id number]- FOLLICULAR LYMPHOMA G-2; [s/p Bx- RETROPERITONEAL/LEFT Peri-aortic LN ~ 2.5CM [incidental on CT scan in 2016; CT- Nov 2014- ~1CM]/ PET 2016-Left Peri-aortic LN; Suv ~15; Ext Iliac LN 6 mm; Feb 2017- Unchanged LN; START Rituxan q week x4 [finished march 15th]; PET JAN 30th 2018- NED.   # Hx of Shingles [Sep 2016]-   # Kidney cysts [previous Dr.Wolfe]  DIAGNOSIS: Follicle lymphoma-G-2  STAGE: Stage II       ;GOALS: Control  CURRENT/MOST RECENT THERAPY: Surveillance    Follicular lymphoma grade ii, intra-abdominal lymph nodes ([place])      INTERVAL HISTORY:  Whitney Molina 79 y.o.  female pleasant patient above history of stage II follicular lymphoma-grade 2 is here for follow-up.  Patient denies any new lumps or bumps.  Appetite is good.  No nausea vomiting weight loss.  No night sweats.   Review of Systems  Constitutional: Negative for chills, diaphoresis, fever, malaise/fatigue and weight loss.  HENT: Negative for nosebleeds and sore throat.   Eyes: Negative for double vision.  Respiratory: Negative for cough, hemoptysis, sputum production, shortness of breath and wheezing.   Cardiovascular: Negative for chest pain, palpitations, orthopnea and leg swelling.  Gastrointestinal: Negative for abdominal pain, blood in stool, constipation, diarrhea, heartburn, melena, nausea and vomiting.  Genitourinary: Negative for dysuria, frequency and urgency.  Musculoskeletal: Negative for back pain and joint pain.  Skin: Negative.  Negative for itching and rash.  Neurological: Negative for dizziness, tingling, focal weakness, weakness and headaches.  Endo/Heme/Allergies: Does not bruise/bleed easily.   Psychiatric/Behavioral: Negative for depression. The patient is not nervous/anxious and does not have insomnia.       PAST MEDICAL HISTORY :  Past Medical History:  Diagnosis Date  . Basal cell carcinoma of nose   . Bronchitis   . GERD (gastroesophageal reflux disease)   . Heart murmur   . Hemorrhoids   . History of basal cell carcinoma    right upper lip, right ant shoulder  . History of colon polyps   . History of dysplastic nevus 12/05/2001   left paraspinal upper back, upper back spinal  . History of kidney stones   . History of mammogram 2016  . History of squamous cell carcinoma 02/07/2005   left ant chest  . Hyperlipidemia   . Hypertension   . Non Hodgkin's lymphoma ([place])   . 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 ABDOMINAL HYSTERECTOMY  1984  . BLADDER SUSPENSION  2010  . CHOLECYSTECTOMY  06-01-03  . COLONOSCOPY  2003  . LITHOTRIPSY  2005  . local exicsion skin cancer  2004   squamous cell of chest wall. left chest  . OOPHORECTOMY    . TONSILLECTOMY  1971  . TUBAL LIGATION  1975  . VAGINAL PROLAPSE REPAIR  July 2001   Pelvic Prolapse    FAMILY HISTORY :   Family History  Problem Relation Age of Onset  . Stroke Mother   . Breast cancer Mother 73  . Stroke Father   . Dementia Father   . Cancer Brother  bladder  . Stroke Brother   . Breast cancer Paternal Aunt 14  . Bipolar disorder Son     SOCIAL HISTORY:   Social History   Tobacco Use  . Smoking status: Never Smoker  . Smokeless tobacco: Never Used  Substance Use Topics  . Alcohol use: No  . Drug use: No    ALLERGIES:  has No Known Allergies.  MEDICATIONS:  Current Outpatient Medications  Medication Sig Dispense Refill  . cephALEXin (KEFLEX) 250 MG capsule Take 1 capsule (250 mg total) by mouth 2 (two) times daily. 10 capsule 0  . Cholecalciferol (VITAMIN D3)  1000 units CAPS Take 1,000 Units by mouth daily.    Marland Kitchen docusate sodium (COLACE) 100 MG capsule Take 100 mg by mouth daily.    Marland Kitchen escitalopram (LEXAPRO) 10 MG tablet TAKE 1 TABLET BY MOUTH ONCE DAILY 90 tablet 1  . hyoscyamine (LEVBID) 0.375 MG 12 hr tablet TAKE 1 TABLET BY MOUTH ONCE DAILY. 90 tablet 1  . losartan (COZAAR) 100 MG tablet TAKE 1 TABLET BY MOUTH ONCE DAILY 90 tablet 0  . metoprolol succinate (TOPROL-XL) 25 MG 24 hr tablet TAKE 1 TABLET BY MOUTH ONCE DAILY 90 tablet 3  . potassium chloride SA (KLOR-CON) 20 MEQ tablet TAKE 1 TABLET BY MOUTH ONCE DAILY 90 tablet 1  . Probiotic Product (HEALTHY COLON PO) Take 1 capsule by mouth daily.    . simvastatin (ZOCOR) 40 MG tablet TAKE 1 TABLET BY MOUTH ONCE EVERY EVENING 90 tablet 1  . triamterene-hydrochlorothiazide (MAXZIDE-25) 37.5-25 MG tablet TAKE 1/2 TABLET BY MOUTH ONCE DAILY 90 tablet 0  . VENTOLIN HFA 108 (90 Base) MCG/ACT inhaler INHALE 1 PUFF INTO THE LUNGS EVERY 6 HOURS AS NEEDED WHEEZING OR SHORTNESS OFBREATH. 18 g 0   No current facility-administered medications for this visit.    PHYSICAL EXAMINATION: ECOG PERFORMANCE STATUS: 0 - Asymptomatic  BP (!) 171/85 (BP Location: Right Arm, Patient Position: Sitting)   Pulse 63   Temp 97.7 F (36.5 C) (Tympanic)   Resp 20   Ht 5' 3.5" (1.613 m)   Wt 150 lb 6.4 oz (68.2 kg)   BMI 26.22 kg/m   Filed Weights   07/21/19 1501  Weight: 150 lb 6.4 oz (68.2 kg)    Physical Exam  Constitutional: She is oriented to person, place, and time and well-developed, well-nourished, and in no distress.  Alone.  Walking myself.  HENT:  Head: Normocephalic and atraumatic.  Mouth/Throat: Oropharynx is clear and moist. No oropharyngeal exudate.  Eyes: Pupils are equal, round, and reactive to light.  Cardiovascular: Normal rate and regular rhythm.  Pulmonary/Chest: No respiratory distress. She has no wheezes.  Abdominal: Soft. Bowel sounds are normal. She exhibits no distension and no mass.  There is no abdominal tenderness. There is no rebound and no guarding.  Musculoskeletal:        General: No tenderness or edema. Normal range of motion.     Cervical back: Normal range of motion and neck supple.  Neurological: She is alert and oriented to person, place, and time.  Skin: Skin is warm.  Psychiatric: Affect normal.    LABORATORY DATA:  I have reviewed the data as listed    Component Value Date/Time   NA 136 07/21/2019 1445   NA 138 09/10/2013 0758   K 3.6 07/21/2019 1445   K 3.3 (L) 09/10/2013 0758   CL 101 07/21/2019 1445   CL 100 09/10/2013 0758   CO2 27 07/21/2019 1445   CO2  32 09/10/2013 0758   GLUCOSE 82 07/21/2019 1445   GLUCOSE 85 09/10/2013 0758   BUN 11 07/21/2019 1445   BUN 13 09/10/2013 0758   CREATININE 0.97 07/21/2019 1445   CREATININE 0.96 12/22/2013 0857   CALCIUM 8.9 07/21/2019 1445   CALCIUM 9.2 09/10/2013 0758   PROT 7.8 07/21/2019 1445   PROT 8.3 (H) 09/10/2013 0758   ALBUMIN 4.3 07/21/2019 1445   ALBUMIN 3.5 09/10/2013 0758   AST 20 07/21/2019 1445   AST 23 09/10/2013 0758   ALT 21 07/21/2019 1445   ALT 28 09/10/2013 0758   ALKPHOS 62 07/21/2019 1445   ALKPHOS 78 09/10/2013 0758   BILITOT 0.7 07/21/2019 1445   BILITOT 0.6 09/10/2013 0758   GFRNONAA 56 (L) 07/21/2019 1445   GFRNONAA 59 (L) 12/22/2013 0857   GFRAA >60 07/21/2019 1445   GFRAA 68 12/22/2013 0857    No results found for: SPEP, UPEP  Lab Results  Component Value Date   WBC 8.5 07/21/2019   NEUTROABS 4.8 07/21/2019   HGB 13.1 07/21/2019   HCT 39.5 07/21/2019   MCV 95.2 07/21/2019   PLT 234 07/21/2019      Chemistry      Component Value Date/Time   NA 136 07/21/2019 1445   NA 138 09/10/2013 0758   K 3.6 07/21/2019 1445   K 3.3 (L) 09/10/2013 0758   CL 101 07/21/2019 1445   CL 100 09/10/2013 0758   CO2 27 07/21/2019 1445   CO2 32 09/10/2013 0758   BUN 11 07/21/2019 1445   BUN 13 09/10/2013 0758   CREATININE 0.97 07/21/2019 1445   CREATININE 0.96  12/22/2013 0857      Component Value Date/Time   CALCIUM 8.9 07/21/2019 1445   CALCIUM 9.2 09/10/2013 0758   ALKPHOS 62 07/21/2019 1445   ALKPHOS 78 09/10/2013 0758   AST 20 07/21/2019 1445   AST 23 09/10/2013 0758   ALT 21 07/21/2019 1445   ALT 28 09/10/2013 0758   BILITOT 0.7 07/21/2019 1445   BILITOT 0.6 09/10/2013 0758       RADIOGRAPHIC STUDIES: I have personally reviewed the radiological images as listed and agreed with the findings in the report. No results found.   ASSESSMENT & PLAN:  Follicular lymphoma grade ii, intra-abdominal lymph nodes ([place]) # RETROPERITONEAL LN [incidental/asymptomatic]- follicular lymphoma grade 2; likely stage II; status post rituximab. FEB 2019 PET scan NED.    #Clinically no evidence recurrence.  stable.; will get scans on clinical basis.  # cancer screening- colo in Jan 2020 [Dr.Elliot]; Mammo- feb 2020-Normal.  # DISPOSITION: # follow up in 6 months-MD/ labs-cbc/cmp,ldh-Dr.B  Cc: [id number]   Orders Placed This Encounter  Procedures  . CBC with Differential    Standing Status:   Future    Standing Expiration Date:   07/20/2020  . Comprehensive metabolic panel    Standing Status:   Future    Standing Expiration Date:   07/20/2020  . Lactate dehydrogenase    Standing Status:   Future    Standing Expiration Date:   07/20/2020   All questions were answered. The patient knows to call the clinic with any problems, questions or concerns.      Cammie Sickle, MD 07/22/2019 6:23 AM

## 2019-07-27 ENCOUNTER — Other Ambulatory Visit: Payer: Self-pay | Admitting: Internal Medicine

## 2019-08-22 ENCOUNTER — Other Ambulatory Visit: Payer: Self-pay | Admitting: Internal Medicine

## 2019-08-22 DIAGNOSIS — Z1231 Encounter for screening mammogram for malignant neoplasm of breast: Secondary | ICD-10-CM

## 2019-08-27 ENCOUNTER — Ambulatory Visit
Admission: RE | Admit: 2019-08-27 | Discharge: 2019-08-27 | Disposition: A | Discharge status: Home / Self Care | Payer: Medicare Other | Source: Ambulatory Visit | Attending: Internal Medicine | Admitting: Internal Medicine

## 2019-08-27 DIAGNOSIS — Z1231 Encounter for screening mammogram for malignant neoplasm of breast: Secondary | ICD-10-CM | POA: Diagnosis not present

## 2019-09-15 ENCOUNTER — Ambulatory Visit (INDEPENDENT_AMBULATORY_CARE_PROVIDER_SITE_OTHER): Payer: Medicare Other

## 2019-09-15 VITALS — BP 137/78 | HR 63 | Ht 63.5 in | Wt 148.0 lb

## 2019-09-15 DIAGNOSIS — Z0000 Encounter for general adult medical examination without abnormal findings: Secondary | ICD-10-CM

## 2019-09-15 NOTE — Patient Instructions (Addendum)
  Whitney Molina , Thank you for taking time to come for your Medicare Wellness Visit. I appreciate your ongoing commitment to your health goals. Please review the following plan we discussed and let me know if I can assist you in the future.   These are the goals we discussed: Goals      Patient Stated   . Increase physical activity (pt-stated)     Walk more for exercise       This is a list of the screening recommended for you and due dates:  Health Maintenance  Topic Date Due  . Flu Shot  12/14/2019  . Mammogram  08/26/2020  . Tetanus Vaccine  11/22/2022  . DEXA scan (bone density measurement)  Completed  . COVID-19 Vaccine  Completed  . Pneumonia vaccines  Completed

## 2019-09-15 NOTE — Progress Notes (Addendum)
Subjective:   Thy A Mcfail is a 79 y.o. female who presents for Medicare Annual (Subsequent) preventive examination.  Review of Systems:  No ROS.  Medicare Wellness Virtual Visit.  Visual/audio telehealth visit. Vital signs provided by patient. See social history for additional risk factors.   Cardiac Risk Factors include: advanced age (>17men, >30 women);hypertension     Objective:     Vitals: BP 137/78 (BP Location: Left Arm, Patient Position: Sitting, Cuff Size: Normal)   Pulse 63   Ht 5' 3.5" (1.613 m)   Wt 148 lb (67.1 kg)   BMI 25.81 kg/m   Body mass index is 25.81 kg/m.  Advanced Directives 09/15/2019 01/10/2019 09/12/2018 07/12/2018 01/10/2018 10/12/2017 09/23/2017  Does Patient Have a Medical Advance Directive? No No No No No No No  Would patient like information on creating a medical advance directive? No - Patient declined No - Patient declined No - Patient declined No - Patient declined No - Patient declined No - Patient declined No - Patient declined    Tobacco Social History   Tobacco Use  Smoking Status Never Smoker  Smokeless Tobacco Never Used     Counseling given: Not Answered   Clinical Intake:  Pre-visit preparation completed: Yes        Diabetes: No  How often do you need to have someone help you when you read instructions, pamphlets, or other written materials from your doctor or pharmacy?: 1 - Never  Interpreter Needed?: No     Past Medical History:  Diagnosis Date  . Basal cell carcinoma of nose   . Bronchitis   . GERD (gastroesophageal reflux disease)   . Heart murmur   . Hemorrhoids   . History of basal cell carcinoma    right upper lip, right ant shoulder  . History of colon polyps   . History of dysplastic nevus 12/05/2001   left paraspinal upper back, upper back spinal  . History of kidney stones   . History of mammogram 2016  . History of squamous cell carcinoma 02/07/2005   left ant chest  . Hyperlipidemia   .  Hypertension   . Non Hodgkin's lymphoma ([place])   . 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 ABDOMINAL HYSTERECTOMY  1984  . BLADDER SUSPENSION  2010  . CHOLECYSTECTOMY  06-01-03  . COLONOSCOPY  2003  . LITHOTRIPSY  2005  . local exicsion skin cancer  2004   squamous cell of chest wall. left chest  . OOPHORECTOMY    . TONSILLECTOMY  1971  . TUBAL LIGATION  1975  . VAGINAL PROLAPSE REPAIR  July 2001   Pelvic Prolapse   Family History  Problem Relation Age of Onset  . Stroke Mother   . Breast cancer Mother 71  . Stroke Father   . Dementia Father   . Cancer Brother        bladder  . Stroke Brother   . Breast cancer Paternal Aunt 66  . Bipolar disorder Son    Social History   Socioeconomic History  . Marital status: Married    Spouse name: Not on file  . Number of children: Not on file  . Years of education: Not on file  . Highest education level: Not on file  Occupational History  . Not on file  Tobacco Use  . Smoking status: Never Smoker  . Smokeless tobacco: Never Used  Substance and Sexual Activity  . Alcohol use: No  . Drug use: No  . Sexual activity: Yes  Other Topics Concern  . Not on file  Social History Narrative  . Not on file   Social Determinants of Health   Financial Resource Strain:   . Difficulty of Paying Living Expenses:   Food Insecurity:   . Worried About Charity fundraiser in the Last Year:   . Arboriculturist in the Last Year:   Transportation Needs:   . Film/video editor (Medical):   Marland Kitchen Lack of Transportation (Non-Medical):   Physical Activity:   . Days of Exercise per Week:   . Minutes of Exercise per Session:   Stress:   . Feeling of Stress :   Social Connections:   . Frequency of Communication with Friends and Family:   . Frequency of Social Gatherings with Friends and Family:   . Attends Religious Services:   . Active Member of Clubs  or Organizations:   . Attends Archivist Meetings:   Marland Kitchen Marital Status:     Outpatient Encounter Medications as of 09/15/2019  Medication Sig  . Cholecalciferol (VITAMIN D3) 1000 units CAPS Take 1,000 Units by mouth daily.  Marland Kitchen Co-Enzyme Q10 200 MG CAPS Take 1 tablet by mouth. Taking 1 every other day  . docusate sodium (COLACE) 100 MG capsule Take 100 mg by mouth daily.  Marland Kitchen escitalopram (LEXAPRO) 10 MG tablet TAKE 1 TABLET BY MOUTH ONCE DAILY  . hyoscyamine (LEVBID) 0.375 MG 12 hr tablet TAKE 1 TABLET BY MOUTH ONCE DAILY.  Marland Kitchen losartan (COZAAR) 100 MG tablet TAKE 1 TABLET BY MOUTH ONCE DAILY  . metoprolol succinate (TOPROL-XL) 25 MG 24 hr tablet TAKE 1 TABLET BY MOUTH ONCE DAILY  . potassium chloride SA (KLOR-CON) 20 MEQ tablet TAKE 1 TABLET BY MOUTH ONCE DAILY  . Red Yeast Rice Extract (RED YEAST RICE PO) Take 1 tablet by mouth. Taking every other day  . simvastatin (ZOCOR) 40 MG tablet TAKE 1 TABLET BY MOUTH ONCE EVERY EVENING  . triamterene-hydrochlorothiazide (MAXZIDE-25) 37.5-25 MG tablet TAKE 1/2 TABLET BY MOUTH ONCE DAILY  . Probiotic Product (HEALTHY COLON PO) Take 1 capsule by mouth daily.  . VENTOLIN HFA 108 (90 Base) MCG/ACT inhaler INHALE 1 PUFF INTO THE LUNGS EVERY 6 HOURS AS NEEDED WHEEZING OR SHORTNESS OFBREATH. (Patient not taking: Reported on 09/15/2019)  . [DISCONTINUED] cephALEXin (KEFLEX) 250 MG capsule Take 1 capsule (250 mg total) by mouth 2 (two) times daily.   No facility-administered encounter medications on file as of 09/15/2019.    Activities of Daily Living In your present state of health, do you have any difficulty performing the following activities: 09/15/2019  Hearing? N  Vision? N  Difficulty concentrating or making decisions? N  Walking or climbing stairs? N  Dressing or bathing? N  Doing errands, shopping? N  Preparing Food and eating ? N  Using the Toilet? N  In the past six months, have you accidently leaked urine? N  Do you have problems with  loss of bowel control? N  Managing your Medications? N  Managing your Finances? N  Housekeeping or managing your Housekeeping? N  Some recent data might be hidden    Patient Care Team: [name], MD as PCP - General (Internal Medicine) Bary Castilla Forest Gleason, MD (General Surgery)    Assessment:   This is a routine wellness examination for [place].  Nurse connected with patient 09/15/19 at 10:30 AM  EDT by a telephone enabled telemedicine application and verified that I am speaking with the correct person using two identifiers. Patient stated full name and DOB. Patient gave permission to continue with virtual visit. Patient's location was at home and Nurse's location was at [place] office.   Patient is alert and oriented x3. Patient denies difficulty focusing or concentrating. 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   Health Maintenance Due: See completed HM at the end of note.   Eye: Visual acuity not assessed. Virtual visit. Followed by their ophthalmologist.  Dental: Visits every 6 months.    Hearing: Demonstrates normal hearing during visit.  Safety:  Patient feels safe at home- yes Patient does have smoke detectors at home- yes Patient does wear sunscreen or protective clothing when in direct sunlight - yes Patient does wear seat belt when in a moving vehicle - yes Patient drives- yes Adequate lighting in walkways free from debris- yes Grab bars and handrails used as appropriate- yes Ambulates with an assistive device- no Cell phone on person when ambulating outside of the home- yes  Social: Alcohol intake - no     Smoking history- never   Smokers in home? none Illicit drug use? none  Medication: No longer using ventolin hfa 108 per patient preference. Plans to schedule with Dr. Mortimer Fries 10/2019. Practices breathing exercises daily and doing well.  Pill box in use -yes  Self managed - yes   Covid-19: Precautions and sickness symptoms discussed.  Wears mask, social distancing, hand hygiene as appropriate.   Activities of Daily Living Patient denies needing assistance with: household chores, feeding themselves, getting from bed to chair, getting to the toilet, bathing/showering, dressing, managing money, or preparing meals.   Discussed the importance of a healthy diet, water intake and the benefits of aerobic exercise.  Physical activity- plans to start walking. Stays active around the home.   Diet:  Low carb Water: good intake Caffeine: 1/2 cup daily  Other Providers Patient Care Team: [name], MD as PCP - General (Internal Medicine) Bary Castilla Forest Gleason, MD (General Surgery)  Exercise Activities and Dietary recommendations Current Exercise Habits: The patient does not participate in regular exercise at present  Goals      Patient Stated   . Increase physical activity (pt-stated)     Walk more for exercise       Fall Risk Fall Risk  09/15/2019 03/11/2019 09/12/2018 09/10/2017 01/23/2017  Falls in the past year? 0 0 0 No No  Follow up Falls evaluation completed Falls evaluation completed - - -   Timed Get Up and Go performed: no, virtual visit  Depression Screen PHQ 2/9 Scores 09/15/2019 09/12/2018 09/10/2017 01/23/2017  PHQ - 2 Score 0 0 0 0     Cognitive Function MMSE - Mini Mental State Exam 09/10/2017 09/04/2016 05/20/2015  Orientation to time 5 5 5   Orientation to Place 5 5 5   Registration 3 3 3   Attention/ Calculation 5 5 5   Recall 3 2 3   Language- name 2 objects 2 2 2   Language- repeat 1 1 1   Language- follow 3 step command 3 3 3   Language- read & follow direction 1 1 1   Write a sentence 1 1 1   Copy design 1 1 1   Total score 30 29 30      6CIT Screen 09/15/2019 09/12/2018  What Year? 0 points 0 points  What month? 0 points 0 points  What time? 0 points 0 points  Count back from  20 - 0 points  Months in reverse - 0 points  Repeat phrase - 0 points  Total Score - 0    Immunization History    Administered Date(s) Administered  . Fluad Quad(high Dose 65+) 02/13/2019  . Influenza Split 01/16/2013, 02/26/2014  . Influenza, High Dose Seasonal PF 01/10/2016, 02/19/2017, 03/08/2018  . Influenza,inj,Quad PF,6+ Mos 04/21/2015  . PFIZER SARS-COV-2 Vaccination 05/22/2019, 06/12/2019  . Pneumococcal Conjugate-13 06/24/2014  . Pneumococcal Polysaccharide-23 04/01/2018  . Pneumococcal-Unspecified 05/16/2011  . Td 11/12/2012  . Tdap 11/21/2012  . Zoster 02/23/2015   Screening Tests Health Maintenance  Topic Date Due  . INFLUENZA VACCINE  12/14/2019  . MAMMOGRAM  08/26/2020  . TETANUS/TDAP  11/22/2022  . DEXA SCAN  Completed  . COVID-19 Vaccine  Completed  . PNA vac Low Risk Adult  Completed      Plan:   Keep all routine maintenance appointments.   Medicare Attestation I have personally reviewed: The patient's medical and social history Their use of alcohol, tobacco or illicit drugs Their current medications and supplements The patient's functional ability including ADLs,fall risks, home safety risks, cognitive, and hearing and visual impairment Diet and physical activities Evidence for depression   I have reviewed and discussed with patient certain preventive protocols, quality metrics, and best practice recommendations.      OBrien-Blaney, Aiza Vollrath L, LPN  [id number]     I have reviewed the above information and agree with above.   Deborra Medina, MD

## 2019-09-18 ENCOUNTER — Other Ambulatory Visit: Payer: Self-pay | Admitting: Internal Medicine

## 2019-09-19 ENCOUNTER — Other Ambulatory Visit: Payer: Self-pay

## 2019-09-19 ENCOUNTER — Ambulatory Visit (INDEPENDENT_AMBULATORY_CARE_PROVIDER_SITE_OTHER): Payer: Medicare Other | Admitting: Primary Care

## 2019-09-19 ENCOUNTER — Encounter: Payer: Self-pay | Admitting: Primary Care

## 2019-09-19 DIAGNOSIS — J449 Chronic obstructive pulmonary disease, unspecified: Secondary | ICD-10-CM | POA: Diagnosis not present

## 2019-09-19 MED ORDER — VENTOLIN HFA 108 (90 BASE) MCG/ACT IN AERS: INHALATION_SPRAY | RESPIRATORY_TRACT | 2 refills | Status: DC

## 2019-09-19 NOTE — Patient Instructions (Signed)
Mild COPD: Continue Ventolin 2 puffs every 6 hours as needed for shortness of breath or wheezing  Follow-up: 6 months Dr. Mortimer Fries or sooner if needed

## 2019-09-19 NOTE — Progress Notes (Signed)
Virtual Visit via Telephone Note  I connected with Whitney Molina on 09/19/19 at  4:30 PM EDT by telephone and verified that I am speaking with the correct person using two identifiers.  Location: Patient: Home Provider: Office   I discussed the limitations, risks, security and privacy concerns of performing an evaluation and management service by telephone and the availability of in person appointments. I also discussed with the patient that there may be a patient responsible charge related to this service. The patient expressed understanding and agreed to proceed.   History of Present Illness: 79 year old female, never smoked (no.  Past medical history significant for mild COPD, viral bronchitis.  Patient of Dr. Mortimer Fries, last seen October 2019. Triggers include cats, dust, pollens. Eosinophils 200.   09/19/2019 Patient contacted today for routine televisit/medicaiton refill. She is doing well, no acute complaints. She needs refill ventolin. Uses once or twice a month as needed for shortness of breath. Practices deep breathing exercises. She does have seasonal allergies. She had allergy shots in the past. Takes mucinex as needed. Denies acute shortness of breath, wheezing, chest tightness or cough. She has received both covid vaccines.   Spirometry: 01/29/2017- FVC 2.5 (94%), FEV1 1.7 (84%), ratio 67  Observations/Objective:  - Able to speak in full sentences, no shortness of breath or wheezing   Assessment and Plan:  Mild COPD - Never smoked. Hx chronic bronchitis, spirometry showed mild obstruction  - Symptoms well controlled with prn SABA, rare use - Continue Ventolin 2 puffs every 6 hours as needed for shortness of breath or wheezing (refills sent to pharmacy) - Up to date with COVID vaccine   Follow Up Instructions:   - FU in 6-8 months with Dr. Mortimer Fries   I discussed the assessment and treatment plan with the patient. The patient was provided an opportunity to ask questions  and all were answered. The patient agreed with the plan and demonstrated an understanding of the instructions.   The patient was advised to call back or seek an in-person evaluation if the symptoms worsen or if the condition fails to improve as anticipated.  I provided 15 minutes of non-face-to-face time during this encounter.   Martyn Ehrich, NP

## 2019-10-30 ENCOUNTER — Other Ambulatory Visit: Payer: Self-pay | Admitting: Internal Medicine

## 2019-11-27 ENCOUNTER — Other Ambulatory Visit: Payer: Self-pay | Admitting: Internal Medicine

## 2019-11-30 ENCOUNTER — Other Ambulatory Visit: Payer: Self-pay | Admitting: Internal Medicine

## 2020-01-21 ENCOUNTER — Inpatient Hospital Stay: Payer: Medicare Other | Attending: Internal Medicine

## 2020-01-21 ENCOUNTER — Inpatient Hospital Stay (HOSPITAL_BASED_OUTPATIENT_CLINIC_OR_DEPARTMENT_OTHER): Payer: Medicare Other | Admitting: Internal Medicine

## 2020-01-21 ENCOUNTER — Other Ambulatory Visit: Payer: Self-pay

## 2020-01-21 DIAGNOSIS — C8213 Follicular lymphoma grade II, intra-abdominal lymph nodes: Secondary | ICD-10-CM | POA: Diagnosis not present

## 2020-01-21 DIAGNOSIS — E785 Hyperlipidemia, unspecified: Secondary | ICD-10-CM | POA: Diagnosis not present

## 2020-01-21 DIAGNOSIS — Z79899 Other long term (current) drug therapy: Secondary | ICD-10-CM | POA: Diagnosis not present

## 2020-01-21 DIAGNOSIS — Z9225 Personal history of immunosupression therapy: Secondary | ICD-10-CM | POA: Diagnosis not present

## 2020-01-21 DIAGNOSIS — Z803 Family history of malignant neoplasm of breast: Secondary | ICD-10-CM | POA: Insufficient documentation

## 2020-01-21 DIAGNOSIS — N183 Chronic kidney disease, stage 3 unspecified: Secondary | ICD-10-CM | POA: Diagnosis not present

## 2020-01-21 DIAGNOSIS — I129 Hypertensive chronic kidney disease with stage 1 through stage 4 chronic kidney disease, or unspecified chronic kidney disease: Secondary | ICD-10-CM | POA: Insufficient documentation

## 2020-01-21 DIAGNOSIS — K219 Gastro-esophageal reflux disease without esophagitis: Secondary | ICD-10-CM | POA: Insufficient documentation

## 2020-01-21 DIAGNOSIS — Z8052 Family history of malignant neoplasm of bladder: Secondary | ICD-10-CM | POA: Insufficient documentation

## 2020-01-21 LAB — CBC WITH DIFFERENTIAL/PLATELET
Abs Immature Granulocytes: 0.02 10*3/uL (ref 0.00–0.07)
Basophils Absolute: 0 10*3/uL (ref 0.0–0.1)
Basophils Relative: 1 %
Eosinophils Absolute: 0.3 10*3/uL (ref 0.0–0.5)
Eosinophils Relative: 4 %
HCT: 37.8 % (ref 36.0–46.0)
Hemoglobin: 13.1 g/dL (ref 12.0–15.0)
Immature Granulocytes: 0 %
Lymphocytes Relative: 29 %
Lymphs Abs: 2.1 10*3/uL (ref 0.7–4.0)
MCH: 32 pg (ref 26.0–34.0)
MCHC: 34.7 g/dL (ref 30.0–36.0)
MCV: 92.2 fL (ref 80.0–100.0)
Monocytes Absolute: 0.6 10*3/uL (ref 0.1–1.0)
Monocytes Relative: 9 %
Neutro Abs: 4.1 10*3/uL (ref 1.7–7.7)
Neutrophils Relative %: 57 %
Platelets: 263 10*3/uL (ref 150–400)
RBC: 4.1 MIL/uL (ref 3.87–5.11)
RDW: 12.2 % (ref 11.5–15.5)
WBC: 7.1 10*3/uL (ref 4.0–10.5)
nRBC: 0 % (ref 0.0–0.2)

## 2020-01-21 LAB — COMPREHENSIVE METABOLIC PANEL
ALT: 17 U/L (ref 0–44)
AST: 18 U/L (ref 15–41)
Albumin: 4.2 g/dL (ref 3.5–5.0)
Alkaline Phosphatase: 67 U/L (ref 38–126)
Anion gap: 9 (ref 5–15)
BUN: 13 mg/dL (ref 8–23)
CO2: 28 mmol/L (ref 22–32)
Calcium: 9.1 mg/dL (ref 8.9–10.3)
Chloride: 99 mmol/L (ref 98–111)
Creatinine, Ser: 1 mg/dL (ref 0.44–1.00)
GFR calc Af Amer: >60 mL/min (ref >60)
GFR calc non Af Amer: 54 mL/min — ABNORMAL LOW (ref >60)
Glucose, Bld: 91 mg/dL (ref 70–99)
Potassium: 4.3 mmol/L (ref 3.5–5.1)
Sodium: 136 mmol/L (ref 135–145)
Total Bilirubin: 0.8 mg/dL (ref 0.3–1.2)
Total Protein: 8.1 g/dL (ref 6.5–8.1)

## 2020-01-21 LAB — LACTATE DEHYDROGENASE: LDH: 160 U/L (ref 98–192)

## 2020-01-21 NOTE — Progress Notes (Signed)
[facility] OFFICE PROGRESS NOTE  Patient Care Team: [name], MD as PCP - General (Internal Medicine) Bary Castilla Forest Gleason, MD (General Surgery)  Cancer Staging No matching staging information was found for the patient.   Oncology History Overview Note   # NOV 3149- FOLLICULAR LYMPHOMA G-2; [s/p Bx- RETROPERITONEAL/LEFT Peri-aortic LN ~ 2.5CM [incidental on CT scan in 2016; CT- Nov 2014- ~1CM]/ PET 2016-Left Peri-aortic LN; Suv ~15; Ext Iliac LN 6 mm; Feb 2017- Unchanged LN; START Rituxan q week x4 [finished march 15th]; PET JAN 30th 2018- NED.   # Hx of Shingles [Sep 2016]-   # Kidney cysts [previous Dr.Wolfe]  DIAGNOSIS: Follicle lymphoma-G-2  STAGE: Stage II       ;GOALS: Control  CURRENT/MOST RECENT THERAPY: Surveillance    Follicular lymphoma grade ii, intra-abdominal lymph nodes ([place])      INTERVAL HISTORY:  Whitney Molina 79 y.o.  female pleasant patient above history of stage II follicular lymphoma-grade 2 is here for follow-up.  Patient denies any abdominal pain nausea vomiting.  No new joint pains back pain or night sweats.  Review of Systems  Constitutional: Negative for chills, diaphoresis, fever, malaise/fatigue and weight loss.  HENT: Negative for nosebleeds and sore throat.   Eyes: Negative for double vision.  Respiratory: Negative for cough, hemoptysis, sputum production, shortness of breath and wheezing.   Cardiovascular: Negative for chest pain, palpitations, orthopnea and leg swelling.  Gastrointestinal: Negative for abdominal pain, blood in stool, constipation, diarrhea, heartburn, melena, nausea and vomiting.  Genitourinary: Negative for dysuria, frequency and urgency.  Musculoskeletal: Negative for back pain and joint pain.  Skin: Negative.  Negative for itching and rash.  Neurological: Negative for dizziness, tingling, focal weakness, weakness and headaches.  Endo/Heme/Allergies: Does not bruise/bleed easily.   Psychiatric/Behavioral: Negative for depression. The patient is not nervous/anxious and does not have insomnia.       PAST MEDICAL HISTORY :  Past Medical History:  Diagnosis Date  . Basal cell carcinoma of nose   . Bronchitis   . GERD (gastroesophageal reflux disease)   . Heart murmur   . Hemorrhoids   . History of basal cell carcinoma    right upper lip, right ant shoulder  . History of colon polyps   . History of dysplastic nevus 12/05/2001   left paraspinal upper back, upper back spinal  . History of kidney stones   . History of mammogram 2016  . History of squamous cell carcinoma 02/07/2005   left ant chest  . Hyperlipidemia   . Hypertension   . Non Hodgkin's lymphoma ([place])   . 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 ABDOMINAL HYSTERECTOMY  1984  . BLADDER SUSPENSION  2010  . CHOLECYSTECTOMY  06-01-03  . COLONOSCOPY  2003  . LITHOTRIPSY  2005  . local exicsion skin cancer  2004   squamous cell of chest wall. left chest  . OOPHORECTOMY    . TONSILLECTOMY  1971  . TUBAL LIGATION  1975  . VAGINAL PROLAPSE REPAIR  July 2001   Pelvic Prolapse    FAMILY HISTORY :   Family History  Problem Relation Age of Onset  . Stroke Mother   . Breast cancer Mother 71  . Stroke Father   . Dementia Father   . Cancer Brother        bladder  .  Stroke Brother   . Breast cancer Paternal Aunt 60  . Bipolar disorder Son     SOCIAL HISTORY:   Social History   Tobacco Use  . Smoking status: Never Smoker  . Smokeless tobacco: Never Used  Vaping Use  . Vaping Use: Never used  Substance Use Topics  . Alcohol use: No  . Drug use: No    ALLERGIES:  has No Known Allergies.  MEDICATIONS:  Current Outpatient Medications  Medication Sig Dispense Refill  . Cholecalciferol (VITAMIN D3) 1000 units CAPS Take 1,000 Units by mouth daily.    Marland Kitchen Co-Enzyme Q10 200  MG CAPS Take 1 tablet by mouth. Taking 1 every other day    . docusate sodium (COLACE) 100 MG capsule Take 100 mg by mouth daily.    Marland Kitchen escitalopram (LEXAPRO) 10 MG tablet TAKE 1 TABLET BY MOUTH ONCE DAILY 90 tablet 1  . hyoscyamine (LEVBID) 0.375 MG 12 hr tablet TAKE 1 TABLET BY MOUTH ONCE DAILY 90 tablet 1  . losartan (COZAAR) 100 MG tablet TAKE 1 TABLET BY MOUTH ONCE DAILY 90 tablet 0  . metoprolol succinate (TOPROL-XL) 25 MG 24 hr tablet TAKE 1 TABLET BY MOUTH ONCE DAILY 90 tablet 3  . potassium chloride SA (KLOR-CON) 20 MEQ tablet TAKE 1 TABLET BY MOUTH ONCE DAILY 90 tablet 1  . Probiotic Product (HEALTHY COLON PO) Take 1 capsule by mouth daily.    . Red Yeast Rice Extract (RED YEAST RICE PO) Take 1 tablet by mouth. Taking every other day    . simvastatin (ZOCOR) 40 MG tablet TAKE 1 TABLET BY MOUTH ONCE EVERY EVENING 90 tablet 1  . triamterene-hydrochlorothiazide (MAXZIDE-25) 37.5-25 MG tablet TAKE 1/2 TABLET BY MOUTH ONCE DAILY 90 tablet 0  . VENTOLIN HFA 108 (90 Base) MCG/ACT inhaler INHALE 1 PUFF INTO THE LUNGS EVERY 6 HOURS AS NEEDED WHEEZING OR SHORTNESS OFBREATH. 18 g 2   No current facility-administered medications for this visit.    PHYSICAL EXAMINATION: ECOG PERFORMANCE STATUS: 0 - Asymptomatic  BP (!) 157/78 (Patient Position: Sitting)   Pulse 61   Temp 98 F (36.7 C) (Oral)   Resp 20   Ht 5' 3.5" (1.613 m)   Wt 148 lb (67.1 kg)   BMI 25.81 kg/m   Filed Weights   01/21/20 1321  Weight: 148 lb (67.1 kg)    Physical Exam Constitutional:      Comments: Alone.  Walking myself.  HENT:     Head: Normocephalic and atraumatic.     Mouth/Throat:     Pharynx: No oropharyngeal exudate.  Eyes:     Pupils: Pupils are equal, round, and reactive to light.  Cardiovascular:     Rate and Rhythm: Normal rate and regular rhythm.  Pulmonary:     Effort: No respiratory distress.     Breath sounds: No wheezing.  Abdominal:     General: Bowel sounds are normal. There is no  distension.     Palpations: Abdomen is soft. There is no mass.     Tenderness: There is no abdominal tenderness. There is no guarding or rebound.  Musculoskeletal:        General: No tenderness. Normal range of motion.     Cervical back: Normal range of motion and neck supple.  Skin:    General: Skin is warm.  Neurological:     Mental Status: She is alert and oriented to person, place, and time.  Psychiatric:        Mood and Affect:  Affect normal.     LABORATORY DATA:  I have reviewed the data as listed    Component Value Date/Time   NA 136 01/21/2020 1309   NA 138 09/10/2013 0758   K 4.3 01/21/2020 1309   K 3.3 (L) 09/10/2013 0758   CL 99 01/21/2020 1309   CL 100 09/10/2013 0758   CO2 28 01/21/2020 1309   CO2 32 09/10/2013 0758   GLUCOSE 91 01/21/2020 1309   GLUCOSE 85 09/10/2013 0758   BUN 13 01/21/2020 1309   BUN 13 09/10/2013 0758   CREATININE 1.00 01/21/2020 1309   CREATININE 0.96 12/22/2013 0857   CALCIUM 9.1 01/21/2020 1309   CALCIUM 9.2 09/10/2013 0758   PROT 8.1 01/21/2020 1309   PROT 8.3 (H) 09/10/2013 0758   ALBUMIN 4.2 01/21/2020 1309   ALBUMIN 3.5 09/10/2013 0758   AST 18 01/21/2020 1309   AST 23 09/10/2013 0758   ALT 17 01/21/2020 1309   ALT 28 09/10/2013 0758   ALKPHOS 67 01/21/2020 1309   ALKPHOS 78 09/10/2013 0758   BILITOT 0.8 01/21/2020 1309   BILITOT 0.6 09/10/2013 0758   GFRNONAA 54 (L) 01/21/2020 1309   GFRNONAA 59 (L) 12/22/2013 0857   GFRAA >60 01/21/2020 1309   GFRAA 68 12/22/2013 0857    No results found for: SPEP, UPEP  Lab Results  Component Value Date   WBC 7.1 01/21/2020   NEUTROABS 4.1 01/21/2020   HGB 13.1 01/21/2020   HCT 37.8 01/21/2020   MCV 92.2 01/21/2020   PLT 263 01/21/2020      Chemistry      Component Value Date/Time   NA 136 01/21/2020 1309   NA 138 09/10/2013 0758   K 4.3 01/21/2020 1309   K 3.3 (L) 09/10/2013 0758   CL 99 01/21/2020 1309   CL 100 09/10/2013 0758   CO2 28 01/21/2020 1309   CO2 32  09/10/2013 0758   BUN 13 01/21/2020 1309   BUN 13 09/10/2013 0758   CREATININE 1.00 01/21/2020 1309   CREATININE 0.96 12/22/2013 0857      Component Value Date/Time   CALCIUM 9.1 01/21/2020 1309   CALCIUM 9.2 09/10/2013 0758   ALKPHOS 67 01/21/2020 1309   ALKPHOS 78 09/10/2013 0758   AST 18 01/21/2020 1309   AST 23 09/10/2013 0758   ALT 17 01/21/2020 1309   ALT 28 09/10/2013 0758   BILITOT 0.8 01/21/2020 1309   BILITOT 0.6 09/10/2013 0758       RADIOGRAPHIC STUDIES: I have personally reviewed the radiological images as listed and agreed with the findings in the report. No results found.   ASSESSMENT & PLAN:  Follicular lymphoma grade ii, intra-abdominal lymph nodes ([place]) # RETROPERITONEAL LN [incidental/asymptomatic]- follicular lymphoma grade 2; likely stage II; status post rituximab. FEB 2019 PET scan NED; STABLE.  #Clinically no evidence recurrence. will get scans on clinical basis.  # CKD- III- keep log of blood pressures; recommend taking to PCP's next visit.  Also recommend adequate fluid intake.  COVID BOOSTER: Discussed given patient's diagnosis and other comorbidities/therapies-patient would be considered immunocompromised.  As per CDC recommendation/FDA approval-I would recommend booster vaccine.  Patient is interested.   # DISPOSITION: # follow up in 6 months-MD/ labs-cbc/cmp,ldh-Dr.B  Cc: [email or web address]   Orders Placed This Encounter  Procedures  . Comprehensive metabolic panel    Standing Status:   Future    Standing Expiration Date:   01/20/2021  . CBC with Differential    Standing Status:   Future  Standing Expiration Date:   01/20/2021  . Lactate dehydrogenase    Standing Status:   Future    Standing Expiration Date:   01/20/2021   All questions were answered. The patient knows to call the clinic with any problems, questions or concerns.      Cammie Sickle, MD 01/21/2020 2:53 PM

## 2020-01-21 NOTE — Assessment & Plan Note (Addendum)
#   RETROPERITONEAL LN [incidental/asymptomatic]- follicular lymphoma grade 2; likely stage II; status post rituximab. FEB 2019 PET scan NED; STABLE.  #Clinically no evidence recurrence. will get scans on clinical basis.  # CKD- III- keep log of blood pressures; recommend taking to PCP's next visit.  Also recommend adequate fluid intake.  COVID BOOSTER: Discussed given patient's diagnosis and other comorbidities/therapies-patient would be considered immunocompromised.  As per CDC recommendation/FDA approval-I would recommend booster vaccine.  Patient is interested.   # DISPOSITION: # follow up in 6 months-MD/ labs-cbc/cmp,ldh-Dr.B  Cc: Dr.Tullo

## 2020-01-26 DIAGNOSIS — H2513 Age-related nuclear cataract, bilateral: Secondary | ICD-10-CM | POA: Diagnosis not present

## 2020-01-29 ENCOUNTER — Other Ambulatory Visit: Payer: Self-pay | Admitting: Internal Medicine

## 2020-01-29 ENCOUNTER — Telehealth: Payer: Self-pay | Admitting: Internal Medicine

## 2020-02-02 ENCOUNTER — Other Ambulatory Visit: Payer: Self-pay | Admitting: Internal Medicine

## 2020-02-02 MED ORDER — LOSARTAN POTASSIUM 100 MG PO TABS: 100.0000 mg | ORAL_TABLET | Freq: Every day | ORAL | 1 refills | Status: DC

## 2020-02-02 NOTE — Addendum Note (Signed)
Addended byElpidio Galea T on: 02/02/2020 04:38 PM   Modules accepted: Orders

## 2020-02-02 NOTE — Telephone Encounter (Signed)
Pt needs 90 day supply of losartan (COZAAR) 100 MG tablet

## 2020-02-23 ENCOUNTER — Other Ambulatory Visit: Payer: Self-pay

## 2020-02-23 ENCOUNTER — Telehealth: Payer: Self-pay | Admitting: Internal Medicine

## 2020-02-23 MED ORDER — POTASSIUM CHLORIDE CRYS ER 20 MEQ PO TBCR: 20.0000 meq | EXTENDED_RELEASE_TABLET | Freq: Every day | ORAL | 0 refills | Status: DC

## 2020-02-23 MED ORDER — TRIAMTERENE-HCTZ 37.5-25 MG PO TABS: 0.5000 | ORAL_TABLET | Freq: Every day | ORAL | 0 refills | Status: DC

## 2020-02-23 NOTE — Telephone Encounter (Signed)
Patietriamterene-hydrochlorothiazide (MAXZIDE-25) 37.5-25 MG tabletnt called in refill for potassium chloride SA (KLOR-CON) 20 MEQ tablet, and

## 2020-02-26 ENCOUNTER — Other Ambulatory Visit: Payer: Self-pay

## 2020-02-26 ENCOUNTER — Encounter: Payer: Self-pay | Admitting: Internal Medicine

## 2020-02-26 ENCOUNTER — Ambulatory Visit (INDEPENDENT_AMBULATORY_CARE_PROVIDER_SITE_OTHER): Payer: Medicare Other | Admitting: Internal Medicine

## 2020-02-26 VITALS — BP 150/92 | HR 60 | Temp 98.1°F | Resp 15 | Ht 63.5 in | Wt 150.6 lb

## 2020-02-26 DIAGNOSIS — E785 Hyperlipidemia, unspecified: Secondary | ICD-10-CM

## 2020-02-26 DIAGNOSIS — I700 Atherosclerosis of aorta: Secondary | ICD-10-CM

## 2020-02-26 DIAGNOSIS — E559 Vitamin D deficiency, unspecified: Secondary | ICD-10-CM | POA: Diagnosis not present

## 2020-02-26 DIAGNOSIS — R5383 Other fatigue: Secondary | ICD-10-CM

## 2020-02-26 DIAGNOSIS — I10 Essential (primary) hypertension: Secondary | ICD-10-CM

## 2020-02-26 DIAGNOSIS — I129 Hypertensive chronic kidney disease with stage 1 through stage 4 chronic kidney disease, or unspecified chronic kidney disease: Secondary | ICD-10-CM

## 2020-02-26 DIAGNOSIS — R7303 Prediabetes: Secondary | ICD-10-CM

## 2020-02-26 DIAGNOSIS — R419 Unspecified symptoms and signs involving cognitive functions and awareness: Secondary | ICD-10-CM

## 2020-02-26 MED ORDER — ROSUVASTATIN CALCIUM 20 MG PO TABS: 20.0000 mg | ORAL_TABLET | Freq: Every day | ORAL | 2 refills | Status: DC

## 2020-02-26 MED ORDER — ZOSTER VAC RECOMB ADJUVANTED 50 MCG/0.5ML IM SUSR: 0.5000 mL | Freq: Once | INTRAMUSCULAR | 1 refills | Status: AC

## 2020-02-26 NOTE — Progress Notes (Signed)
Subjective:  Patient ID: Whitney Molina, female    DOB: 1940-10-23  Age: 79 y.o. MRN: [id number]  CC: The primary encounter diagnosis was Hyperlipidemia LDL goal <130. Diagnoses of Vitamin D deficiency, Essential hypertension, Prediabetes, Fatigue, unspecified type, Renal hypertension, and Cognitive complaints with normal neuropsychological exam were also pertinent to this visit.  HPI Whitney Molina presents for evaluation of cognitive changes and other chronic iissues . Last seen by me April 2020   This visit occurred during the SARS-CoV-2 public health emergency.  Safety protocols were in place, including screening questions prior to the visit, additional usage of staff PPE, and extensive cleaning of exam room while observing appropriate contact time as indicated for disinfecting solutions.    Patient has received both doses of the available COVID 19 vaccine without complications.  Patient continues to mask when outside of the home except when walking in yard or at safe distances from others .  Patient denies any change in mood or development of unhealthy behaviors resuting from the pandemic's restriction of activities and socialization.    Nephrolithiasis. During recent Urology follow up she was noted to have a large left renal sinus Cyst  AND a non obstructing renal pelvis  stone seen on kidney during CT.   Seeing Rogers Blocker.   Aortic atherosclerosis:  Extensive changes noted on prior CT. Discussed treatment with patient today.  She has been taking simvastatin and red yeast rice.   Left knee pain :  Seeing Krasinksi tomorrow.  Left hip also bothering her occasionally.  No history of falls or trauma.  REviewed prior imaging films available.   Hypertension: patient checks blood pressure twice weekly at home.  Readings have been for the most part > 140/80 at rest . Patient is following a reduce salt diet most days and is taking medications as prescribedTaking simvastatin    Hypertension: patient checks blood pressure twice weekly at home with a new machine.   Readings have been  <130/80 at rest . Patient is following a reduce salt diet most days and is taking medications as prescribed (losartan 100 mg daily,  And metoprolol 25 mg daily )  Cognitive complaints:  Worried about her short term memory,  gets frustrated easily .  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  Also cites stress of  family illnesses . No bookkeeping mistakes.  Not taking longer to do her job.  Not forgetting appointments or medications  Chronic Constipation:  improved regularity  No longer using  colace   Declines DEXA due to twisting of left leg during last one caused a LOT of pain      Outpatient Medications Prior to Visit  Medication Sig Dispense Refill  . Cholecalciferol (VITAMIN D3) 1000 units CAPS Take 1,000 Units by mouth daily.    Marland Kitchen Co-Enzyme Q10 200 MG CAPS Take 1 tablet by mouth. Taking 1 every other day    . docusate sodium (COLACE) 100 MG capsule Take 100 mg by mouth daily.    Marland Kitchen escitalopram (LEXAPRO) 10 MG tablet TAKE 1 TABLET BY MOUTH ONCE DAILY 90 tablet 1  . hyoscyamine (LEVBID) 0.375 MG 12 hr tablet TAKE 1 TABLET BY MOUTH ONCE DAILY 90 tablet 1  . losartan (COZAAR) 100 MG tablet TAKE 1 TABLET BY MOUTH ONCE DAILY 90 tablet 1  . metoprolol succinate (TOPROL-XL) 25 MG 24 hr tablet  TAKE 1 TABLET BY MOUTH ONCE DAILY 90 tablet 3  . potassium chloride SA (KLOR-CON) 20 MEQ tablet Take 1 tablet (20 mEq total) by mouth daily. 30 tablet 0  . Probiotic Product (HEALTHY COLON PO) Take 1 capsule by mouth daily.    . Red Yeast Rice Extract (RED YEAST RICE PO) Take 1 tablet by mouth. Taking every other day    . triamterene-hydrochlorothiazide (MAXZIDE-25) 37.5-25 MG tablet Take 0.5 tablets by mouth daily. 30 tablet 0  . VENTOLIN HFA 108 (90 Base) MCG/ACT inhaler INHALE 1  PUFF INTO THE LUNGS EVERY 6 HOURS AS NEEDED WHEEZING OR SHORTNESS OFBREATH. 18 g 2  . simvastatin (ZOCOR) 40 MG tablet TAKE 1 TABLET BY MOUTH ONCE EVERY EVENING 90 tablet 1   No facility-administered medications prior to visit.    Review of Systems;  Patient denies headache, fevers, malaise, unintentional weight loss, skin rash, eye pain, sinus congestion and sinus pain, sore throat, dysphagia,  hemoptysis , cough, dyspnea, wheezing, chest pain, palpitations, orthopnea, edema, abdominal pain, nausea, melena, diarrhea, constipation, flank pain, dysuria, hematuria, urinary  Frequency, nocturia, numbness, tingling, seizures,  Focal weakness, Loss of consciousness,  Tremor, insomnia, depression, anxiety, and suicidal ideation.      Objective:  BP (!) 150/92 (BP Location: Left Arm, Patient Position: Sitting, Cuff Size: Normal)   Pulse 60   Temp 98.1 F (36.7 C) (Oral)   Resp 15   Ht 5' 3.5" (1.613 m)   Wt 150 lb 9.6 oz (68.3 kg)   SpO2 96%   BMI 26.26 kg/m   BP Readings from Last 3 Encounters:  02/26/20 (!) 150/92  01/21/20 (!) 157/78  09/15/19 137/78    Wt Readings from Last 3 Encounters:  02/26/20 150 lb 9.6 oz (68.3 kg)  01/21/20 148 lb (67.1 kg)  09/15/19 148 lb (67.1 kg)    General appearance: alert, cooperative and appears stated age Ears: normal TM's and external ear canals both ears Throat: lips, mucosa, and tongue normal; teeth and gums normal Neck: no adenopathy, no carotid bruit, supple, symmetrical, trachea midline and thyroid not enlarged, symmetric, no tenderness/mass/nodules Back: symmetric, no curvature. ROM normal. No CVA tenderness. Lungs: clear to auscultation bilaterally Heart: regular rate and rhythm, S1, S2 normal, no murmur, click, rub or gallop Abdomen: soft, non-tender; bowel sounds normal; no masses,  no organomegaly Pulses: 2+ and symmetric Skin: Skin color, texture, turgor normal. No rashes or lesions Lymph nodes: Cervical, supraclavicular, and  axillary nodes normal. MMSE:  29/30  Lab Results  Component Value Date   HGBA1C 5.9 04/01/2018   HGBA1C 6.0 02/19/2017   HGBA1C 5.9 01/10/2016    Lab Results  Component Value Date   CREATININE 1.00 01/21/2020   CREATININE 0.97 07/21/2019   CREATININE 0.87 03/11/2019    Lab Results  Component Value Date   WBC 7.1 01/21/2020   HGB 13.1 01/21/2020   HCT 37.8 01/21/2020   PLT 263 01/21/2020   GLUCOSE 91 01/21/2020   CHOL 227 (H) 04/01/2018   TRIG 215.0 (H) 04/01/2018   HDL 37.00 (L) 04/01/2018   LDLDIRECT 145.0 04/01/2018   LDLCALC 108 (H) 02/19/2017   ALT 17 01/21/2020   AST 18 01/21/2020   NA 136 01/21/2020   K 4.3 01/21/2020   CL 99 01/21/2020   CREATININE 1.00 01/21/2020   BUN 13 01/21/2020   CO2 28 01/21/2020   TSH 1.29 09/03/2015   INR 1.02 03/24/2015   HGBA1C 5.9 04/01/2018   MICROALBUR 6.9 (H) 04/01/2018  MM 3D SCREEN BREAST BILATERAL  Result Date: 08/27/2019 CLINICAL DATA:  Screening. EXAM: DIGITAL SCREENING BILATERAL MAMMOGRAM WITH TOMO AND CAD COMPARISON:  Previous exam(s). ACR Breast Density Category c: The breast tissue is heterogeneously dense, which may obscure small masses. FINDINGS: There are no findings suspicious for malignancy. Images were processed with CAD. IMPRESSION: No mammographic evidence of malignancy. A result letter of this screening mammogram will be mailed directly to the patient. RECOMMENDATION: Screening mammogram in one year. (Code:SM-B-01Y) BI-RADS CATEGORY  1: Negative. Electronically Signed   By: Everlean Alstrom M.D.   On: 08/27/2019 13:56    Assessment & Plan:   Problem List Items Addressed This Visit      Medium   Hyperlipidemia LDL goal <130 - Primary   Relevant Medications   rosuvastatin (CRESTOR) 20 MG tablet   Other Relevant Orders   Lipid panel   TSH     Unprioritized   Cognitive complaints with normal neuropsychological exam    She has no evidence of cognitive dysfunction on exam today, and per husband, she is  not making calculation errors or taking longer to complete her work as the [employer] for the [employer].  She does have significant anxiety that may be contributing to her perception of dysfunction by affecting her concentration  Continue lexapro .  Screening for reversible deficiencies will be done.        Prediabetes   Relevant Orders   Hemoglobin A1c   Renal hypertension    she reports compliance with medication regimen  but has an elevated reading today in office.  She is not using NSAIDs daily.  Discussed goal of 120/70  (130/80 for patients over 70)  to preserve renal function.  She has been asked to check her  BP  at home and  submit readings for evaluation. Renal function, electrolytes are normal;; she has mild proteinuria y prior check  .  Lab Results  Component Value Date   CREATININE 1.00 01/21/2020   Lab Results  Component Value Date   NA 136 01/21/2020   K 4.3 01/21/2020   CL 99 01/21/2020   CO2 28 01/21/2020   Lab Results  Component Value Date   MICROALBUR 6.9 (H) 04/01/2018           Relevant Medications   rosuvastatin (CRESTOR) 20 MG tablet    Other Visit Diagnoses    Vitamin D deficiency       Relevant Orders   VITAMIN D 25 Hydroxy (Vit-D Deficiency, Fractures)   Fatigue, unspecified type       Relevant Orders   Vitamin B12     I provided  30 minutes of  face-to-face time during this encounter reviewing patient's current problems and past surgeries, labs and imaging studies, providing counseling on the above mentioned problems , and coordination  of care .  I have discontinued Whitney Molina's simvastatin. I am also having her start on rosuvastatin and Zoster Vaccine Adjuvanted. Additionally, I am having her maintain her Vitamin D3, Probiotic Product (HEALTHY COLON PO), docusate sodium, metoprolol succinate, Co-Enzyme Q10, Red Yeast Rice Extract (RED YEAST RICE PO), hyoscyamine, Ventolin HFA, escitalopram, losartan,  triamterene-hydrochlorothiazide, and potassium chloride SA.  Meds ordered this encounter  Medications  . rosuvastatin (CRESTOR) 20 MG tablet    Sig: Take 1 tablet (20 mg total) by mouth daily.    Dispense:  30 tablet    Refill:  2  . Zoster Vaccine Adjuvanted [facility]) injection    Sig: Inject 0.5 mLs  into the muscle once for 1 dose.    Dispense:  1 each    Refill:  1    Medications Discontinued During This Encounter  Medication Reason  . simvastatin (ZOCOR) 40 MG tablet     Follow-up: Return in about 6 weeks (around 04/08/2020).   [name], MD

## 2020-02-26 NOTE — Patient Instructions (Addendum)
STOP SIMVASTATIN  START ROSUVASTIN ONCE OR TWICE WEEK  TAKE RED YEAST IN BETWEEN DOSES    RETURN IN 6 WEEKS FOR FASTING LABS

## 2020-02-28 DIAGNOSIS — R419 Unspecified symptoms and signs involving cognitive functions and awareness: Secondary | ICD-10-CM | POA: Insufficient documentation

## 2020-02-28 DIAGNOSIS — G3184 Mild cognitive impairment, so stated: Secondary | ICD-10-CM | POA: Insufficient documentation

## 2020-02-28 DIAGNOSIS — I700 Atherosclerosis of aorta: Secondary | ICD-10-CM | POA: Insufficient documentation

## 2020-02-28 NOTE — Assessment & Plan Note (Addendum)
She has no evidence of cognitive dysfunction on exam today, and per husband, she is not making calculation errors or taking longer to complete her work as the [employer] for the [employer].  She does have significant anxiety that may be contributing to her perception of dysfunction by affecting her concentration  Continue lexapro .  Screening for reversible deficiencies will be done.

## 2020-02-28 NOTE — Assessment & Plan Note (Addendum)
she reports compliance with medication regimen  but has an elevated reading today in office.  She is not using NSAIDs daily.  Discussed goal of 120/70  (130/80 for patients over 70)  to preserve renal function.  She has been asked to check her  BP  at home and  submit readings for evaluation. Renal function, electrolytes are normal;; she has mild proteinuria y prior check  .  Lab Results  Component Value Date   CREATININE 1.00 01/21/2020   Lab Results  Component Value Date   NA 136 01/21/2020   K 4.3 01/21/2020   CL 99 01/21/2020   CO2 28 01/21/2020   Lab Results  Component Value Date   MICROALBUR 6.9 (H) 04/01/2018

## 2020-02-28 NOTE — Assessment & Plan Note (Signed)
Reviewed findings of prior CT scan today..  Patient is willing to change to more potent statin therpay starting with 20 mg crestor twice weeklyu and advance as tolerated to daily

## 2020-03-03 DIAGNOSIS — M25462 Effusion, left knee: Secondary | ICD-10-CM | POA: Diagnosis not present

## 2020-03-03 DIAGNOSIS — M7122 Synovial cyst of popliteal space [Baker], left knee: Secondary | ICD-10-CM | POA: Diagnosis not present

## 2020-03-03 DIAGNOSIS — M25562 Pain in left knee: Secondary | ICD-10-CM | POA: Diagnosis not present

## 2020-03-03 DIAGNOSIS — M1712 Unilateral primary osteoarthritis, left knee: Secondary | ICD-10-CM | POA: Diagnosis not present

## 2020-03-03 DIAGNOSIS — G8929 Other chronic pain: Secondary | ICD-10-CM | POA: Diagnosis not present

## 2020-03-05 ENCOUNTER — Other Ambulatory Visit: Payer: Self-pay

## 2020-03-05 ENCOUNTER — Ambulatory Visit (INDEPENDENT_AMBULATORY_CARE_PROVIDER_SITE_OTHER): Payer: Medicare Other

## 2020-03-05 DIAGNOSIS — Z23 Encounter for immunization: Secondary | ICD-10-CM | POA: Diagnosis not present

## 2020-03-09 ENCOUNTER — Encounter: Payer: PRIVATE HEALTH INSURANCE | Admitting: Dermatology

## 2020-03-12 DIAGNOSIS — G8929 Other chronic pain: Secondary | ICD-10-CM | POA: Diagnosis not present

## 2020-03-12 DIAGNOSIS — M25562 Pain in left knee: Secondary | ICD-10-CM | POA: Diagnosis not present

## 2020-03-12 DIAGNOSIS — M1712 Unilateral primary osteoarthritis, left knee: Secondary | ICD-10-CM | POA: Diagnosis not present

## 2020-03-26 ENCOUNTER — Other Ambulatory Visit: Payer: Self-pay | Admitting: Internal Medicine

## 2020-04-02 ENCOUNTER — Other Ambulatory Visit: Payer: Self-pay | Admitting: Internal Medicine

## 2020-04-03 ENCOUNTER — Other Ambulatory Visit: Payer: Self-pay | Admitting: Internal Medicine

## 2020-04-07 ENCOUNTER — Other Ambulatory Visit: Payer: PRIVATE HEALTH INSURANCE

## 2020-04-09 ENCOUNTER — Other Ambulatory Visit: Payer: PRIVATE HEALTH INSURANCE

## 2020-04-19 ENCOUNTER — Other Ambulatory Visit: Payer: Self-pay

## 2020-04-20 ENCOUNTER — Other Ambulatory Visit: Payer: Self-pay

## 2020-04-20 ENCOUNTER — Other Ambulatory Visit (INDEPENDENT_AMBULATORY_CARE_PROVIDER_SITE_OTHER): Payer: Medicare Other

## 2020-04-20 DIAGNOSIS — R7303 Prediabetes: Secondary | ICD-10-CM

## 2020-04-20 DIAGNOSIS — E559 Vitamin D deficiency, unspecified: Secondary | ICD-10-CM | POA: Diagnosis not present

## 2020-04-20 DIAGNOSIS — Z23 Encounter for immunization: Secondary | ICD-10-CM | POA: Diagnosis not present

## 2020-04-20 DIAGNOSIS — E785 Hyperlipidemia, unspecified: Secondary | ICD-10-CM | POA: Diagnosis not present

## 2020-04-20 DIAGNOSIS — I10 Essential (primary) hypertension: Secondary | ICD-10-CM

## 2020-04-20 DIAGNOSIS — R5383 Other fatigue: Secondary | ICD-10-CM | POA: Diagnosis not present

## 2020-04-20 LAB — HEMOGLOBIN A1C: Hgb A1c MFr Bld: 6.3 % (ref 4.6–6.5)

## 2020-04-20 LAB — COMPREHENSIVE METABOLIC PANEL
ALT: 12 U/L (ref 0–35)
AST: 15 U/L (ref 0–37)
Albumin: 4.2 g/dL (ref 3.5–5.2)
Alkaline Phosphatase: 64 U/L (ref 39–117)
BUN: 20 mg/dL (ref 6–23)
CO2: 28 mEq/L (ref 19–32)
Calcium: 9.3 mg/dL (ref 8.4–10.5)
Chloride: 102 mEq/L (ref 96–112)
Creatinine, Ser: 1.87 mg/dL — ABNORMAL HIGH (ref 0.40–1.20)
GFR: 25.42 mL/min — ABNORMAL LOW (ref >60.00)
Glucose, Bld: 92 mg/dL (ref 70–99)
Potassium: 4 mEq/L (ref 3.5–5.1)
Sodium: 138 mEq/L (ref 135–145)
Total Bilirubin: 0.8 mg/dL (ref 0.2–1.2)
Total Protein: 7.3 g/dL (ref 6.0–8.3)

## 2020-04-20 LAB — LIPID PANEL
Cholesterol: 106 mg/dL (ref 0–200)
HDL: 31.4 mg/dL — ABNORMAL LOW (ref >39.00)
LDL Cholesterol: 45 mg/dL (ref 0–99)
NonHDL: 74.85
Total CHOL/HDL Ratio: 3
Triglycerides: 151 mg/dL — ABNORMAL HIGH (ref 0.0–149.0)
VLDL: 30.2 mg/dL (ref 0.0–40.0)

## 2020-04-20 LAB — VITAMIN D 25 HYDROXY (VIT D DEFICIENCY, FRACTURES): VITD: 52.92 ng/mL (ref 30.00–100.00)

## 2020-04-20 LAB — MICROALBUMIN / CREATININE URINE RATIO
Creatinine,U: 185.3 mg/dL
Microalb Creat Ratio: 5.2 mg/g (ref 0.0–30.0)
Microalb, Ur: 9.7 mg/dL — ABNORMAL HIGH (ref 0.0–1.9)

## 2020-04-20 LAB — TSH: TSH: 1.19 u[IU]/mL (ref 0.35–4.50)

## 2020-04-20 LAB — VITAMIN B12: Vitamin B-12: >1526 pg/mL — ABNORMAL HIGH (ref 211–911)

## 2020-04-22 ENCOUNTER — Other Ambulatory Visit: Payer: Self-pay | Admitting: Internal Medicine

## 2020-04-22 DIAGNOSIS — R944 Abnormal results of kidney function studies: Secondary | ICD-10-CM

## 2020-04-22 NOTE — Progress Notes (Signed)
  Your renal function declined significantly , which may be a sign of dehydration due to using triamterene.  Please stop this medication any andy NSAIDs  (motrin,  aleve,  etc) and return on Monday for a repeat blood test,  please hydrate before coming   Regards,   Deborra Medina, MD

## 2020-04-25 ENCOUNTER — Other Ambulatory Visit: Payer: Self-pay | Admitting: Internal Medicine

## 2020-04-26 ENCOUNTER — Other Ambulatory Visit: Payer: Self-pay

## 2020-04-26 ENCOUNTER — Other Ambulatory Visit (INDEPENDENT_AMBULATORY_CARE_PROVIDER_SITE_OTHER): Payer: Medicare Other

## 2020-04-26 DIAGNOSIS — R944 Abnormal results of kidney function studies: Secondary | ICD-10-CM | POA: Diagnosis not present

## 2020-04-26 LAB — BASIC METABOLIC PANEL
BUN: 13 mg/dL (ref 6–23)
CO2: 30 mEq/L (ref 19–32)
Calcium: 8.8 mg/dL (ref 8.4–10.5)
Chloride: 102 mEq/L (ref 96–112)
Creatinine, Ser: 1.38 mg/dL — ABNORMAL HIGH (ref 0.40–1.20)
GFR: 36.6 mL/min — ABNORMAL LOW (ref >60.00)
Glucose, Bld: 90 mg/dL (ref 70–99)
Potassium: 3.6 mEq/L (ref 3.5–5.1)
Sodium: 139 mEq/L (ref 135–145)

## 2020-04-29 NOTE — Addendum Note (Signed)
Addended by: [name] on: 04/29/2020 10:00 PM   Modules accepted: Orders

## 2020-04-29 NOTE — Progress Notes (Signed)
Your kidney function is improving but NOT BACK TO BASELINE.  STAY OFF of the fluid pills (triamterene,  Hctz, ) .  We need to repeat the BMET  in 2 seeks to make sure the kidney function is improving

## 2020-04-30 ENCOUNTER — Telehealth: Payer: Self-pay | Admitting: Internal Medicine

## 2020-04-30 NOTE — Telephone Encounter (Signed)
Returned the call to [facility] and left a message to call back.

## 2020-04-30 NOTE — Telephone Encounter (Signed)
Patient was returning call 

## 2020-04-30 NOTE — Telephone Encounter (Signed)
Pt called returning a call back about labs  

## 2020-04-30 NOTE — Telephone Encounter (Signed)
Pt called back. °

## 2020-05-18 ENCOUNTER — Other Ambulatory Visit: Payer: Medicare Other

## 2020-05-20 ENCOUNTER — Other Ambulatory Visit: Payer: Medicare Other

## 2020-05-21 DIAGNOSIS — R3129 Other microscopic hematuria: Secondary | ICD-10-CM | POA: Diagnosis not present

## 2020-05-21 DIAGNOSIS — N202 Calculus of kidney with calculus of ureter: Secondary | ICD-10-CM | POA: Diagnosis not present

## 2020-05-21 DIAGNOSIS — N200 Calculus of kidney: Secondary | ICD-10-CM | POA: Diagnosis not present

## 2020-05-24 ENCOUNTER — Other Ambulatory Visit: Payer: Self-pay | Admitting: Orthopedic Surgery

## 2020-05-24 ENCOUNTER — Other Ambulatory Visit (HOSPITAL_COMMUNITY): Payer: Self-pay | Admitting: Orthopedic Surgery

## 2020-05-24 DIAGNOSIS — R319 Hematuria, unspecified: Secondary | ICD-10-CM

## 2020-05-25 ENCOUNTER — Other Ambulatory Visit (INDEPENDENT_AMBULATORY_CARE_PROVIDER_SITE_OTHER): Payer: Medicare Other

## 2020-05-25 ENCOUNTER — Other Ambulatory Visit: Payer: Self-pay

## 2020-05-25 DIAGNOSIS — R944 Abnormal results of kidney function studies: Secondary | ICD-10-CM | POA: Diagnosis not present

## 2020-05-25 LAB — BASIC METABOLIC PANEL
BUN: 11 mg/dL (ref 6–23)
CO2: 32 mEq/L (ref 19–32)
Calcium: 9.4 mg/dL (ref 8.4–10.5)
Chloride: 103 mEq/L (ref 96–112)
Creatinine, Ser: 1.03 mg/dL (ref 0.40–1.20)
GFR: 51.96 mL/min — ABNORMAL LOW (ref >60.00)
Glucose, Bld: 86 mg/dL (ref 70–99)
Potassium: 3.8 mEq/L (ref 3.5–5.1)
Sodium: 140 mEq/L (ref 135–145)

## 2020-05-27 ENCOUNTER — Telehealth: Payer: Self-pay

## 2020-05-27 NOTE — Progress Notes (Signed)
Your kidney function is  BACK TO BASELINE.  STAY OFF of the diuretics and NSAIDS .  This includes triamterene/hctz,  furosemide meloxicam, diclofenac, celebrex, ibuprofen, motrin, aleve, naproxen.    You  Can use   tylenol ,  Up to 2000 mg daily f needed for chronic pain .  Please schedule an OV if it has been 6 months since last one   Regards,   Deborra Medina, MD

## 2020-05-27 NOTE — Telephone Encounter (Signed)
LMTCB in regards to lab results.  

## 2020-06-01 ENCOUNTER — Other Ambulatory Visit: Payer: Self-pay | Admitting: Internal Medicine

## 2020-06-02 ENCOUNTER — Ambulatory Visit: Admission: RE | Admit: 2020-06-02 | Payer: Medicare Other | Source: Ambulatory Visit

## 2020-06-11 ENCOUNTER — Ambulatory Visit: Admission: RE | Admit: 2020-06-11 | Payer: Medicare Other | Source: Ambulatory Visit

## 2020-06-25 ENCOUNTER — Telehealth: Payer: Self-pay | Admitting: Internal Medicine

## 2020-06-25 NOTE — Telephone Encounter (Signed)
Patient called in she stated that Dr.Tullo wanted her to stop take some over the counter medications and she lost the list wanted to know if someone could call hjer with them

## 2020-06-26 ENCOUNTER — Other Ambulatory Visit: Payer: Self-pay | Admitting: Internal Medicine

## 2020-06-29 ENCOUNTER — Other Ambulatory Visit: Payer: Self-pay | Admitting: Internal Medicine

## 2020-06-29 NOTE — Telephone Encounter (Signed)
Spoke with pt and gave her the list of medications that Dr. Derrel Nip had advised that she stop taking based off of her last lab results. Pt wrote them down and gave a verbal understanding.

## 2020-07-02 DIAGNOSIS — J301 Allergic rhinitis due to pollen: Secondary | ICD-10-CM | POA: Diagnosis not present

## 2020-07-07 ENCOUNTER — Telehealth: Payer: Self-pay | Admitting: Internal Medicine

## 2020-07-08 ENCOUNTER — Other Ambulatory Visit: Payer: Self-pay

## 2020-07-08 ENCOUNTER — Encounter: Payer: Self-pay | Admitting: Internal Medicine

## 2020-07-08 ENCOUNTER — Telehealth: Payer: Self-pay

## 2020-07-08 ENCOUNTER — Ambulatory Visit (INDEPENDENT_AMBULATORY_CARE_PROVIDER_SITE_OTHER): Payer: Medicare Other | Admitting: Internal Medicine

## 2020-07-08 VITALS — BP 160/100 | HR 88 | Temp 97.8°F | Ht 63.5 in | Wt 141.4 lb

## 2020-07-08 DIAGNOSIS — I499 Cardiac arrhythmia, unspecified: Secondary | ICD-10-CM

## 2020-07-08 DIAGNOSIS — I480 Paroxysmal atrial fibrillation: Secondary | ICD-10-CM | POA: Insufficient documentation

## 2020-07-08 DIAGNOSIS — I701 Atherosclerosis of renal artery: Secondary | ICD-10-CM | POA: Diagnosis not present

## 2020-07-08 DIAGNOSIS — I10 Essential (primary) hypertension: Secondary | ICD-10-CM

## 2020-07-08 DIAGNOSIS — I4891 Unspecified atrial fibrillation: Secondary | ICD-10-CM | POA: Diagnosis not present

## 2020-07-08 MED ORDER — METOPROLOL SUCCINATE ER 25 MG PO TB24
25.0000 mg | ORAL_TABLET | Freq: Every day | ORAL | 3 refills | Status: DC
Start: 2020-07-08 — End: 2020-07-09

## 2020-07-08 MED ORDER — AMLODIPINE BESYLATE 5 MG PO TABS: ORAL_TABLET | ORAL | 3 refills | Status: DC

## 2020-07-08 NOTE — Telephone Encounter (Signed)
Attempted to schedule urgent referral. lmov to call office asap.

## 2020-07-08 NOTE — Progress Notes (Signed)
Chief Complaint  Patient presents with  . Hypertension    Pt experiencing high BP, concerned losartan & metoprolol are not managing as they should. Considering if needing a different med.    F/u with husband ken  1. Abnormal heartbeat x 3 years per pt worse x 1 year and uncontrolled htn on toprol 25 mg qd wants to considering getting off of this interferes with allergy shots which she has not taken in 1.5 years but she wants to do shots with ent Dr. Richardson Landry instead of oral pills  htn on losartan 100 mg qhs, toprol xl 25 mg qhs was on norvasc 5 mg qd and maxzide 37.5-25 mg qd but caused AKI when taking 1/2 to 1 pill qd 2. HTN BP at home 165/100s at times with h/a at times. See meds above  Review of Systems  Constitutional: Negative for weight loss.  HENT: Negative for hearing loss.   Eyes: Negative for blurred vision.  Respiratory: Negative for shortness of breath.   Cardiovascular: Positive for palpitations. Negative for chest pain.  Skin: Negative for rash.  Neurological: Positive for headaches.   Past Medical History:  Diagnosis Date  . Basal cell carcinoma of nose   . Bronchitis   . GERD (gastroesophageal reflux disease)   . Heart murmur   . Hemorrhoids   . History of basal cell carcinoma    right upper lip, right ant shoulder  . History of colon polyps   . History of dysplastic nevus 12/05/2001   left paraspinal upper back, upper back spinal  . History of kidney stones   . History of mammogram 2016  . History of squamous cell carcinoma 02/07/2005   left ant chest  . Hyperlipidemia   . Hypertension   . Non Hodgkin's lymphoma ([place])   . 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 ABDOMINAL HYSTERECTOMY  1984  . BLADDER SUSPENSION  2010  . CHOLECYSTECTOMY  06-01-03  . COLONOSCOPY  2003  . LITHOTRIPSY  2005  . local exicsion skin cancer  2004   squamous cell of chest wall. left chest   . OOPHORECTOMY    . TONSILLECTOMY  1971  . TUBAL LIGATION  1975  . VAGINAL PROLAPSE REPAIR  July 2001   Pelvic Prolapse   Family History  Problem Relation Age of Onset  . Stroke Mother   . Breast cancer Mother 62  . Stroke Father   . Dementia Father   . Cancer Brother        bladder  . Stroke Brother   . Heart attack Brother   . Breast cancer Paternal Aunt 69  . Bipolar disorder Son    Social History   Socioeconomic History  . Marital status: Married    Spouse name: Not on file  . Number of children: Not on file  . Years of education: Not on file  . Highest education level: Not on file  Occupational History  . Not on file  Tobacco Use  . Smoking status: Never Smoker  . Smokeless tobacco: Never Used  Vaping Use  . Vaping Use: Never used  Substance and Sexual Activity  . Alcohol use: No  . Drug use: No  . Sexual activity: Yes  Other Topics Concern  . Not on file  Social History Narrative  . Not on file   Social Determinants of Health   Financial Resource Strain: Not on  file  Food Insecurity: Not on file  Transportation Needs: Not on file  Physical Activity: Not on file  Stress: Not on file  Social Connections: Not on file  Intimate Partner Violence: Not on file   Current Meds  Medication Sig  . amLODipine (NORVASC) 5 MG tablet 5 mg daily x 1 week in the am. If BP >130/>80 increase to 10 mg norvasc daily after 1 week in the am.  D/c toprol 25 mg xl qd.  . Cholecalciferol (VITAMIN D3) 1000 units CAPS Take 1,000 Units by mouth daily.  Marland Kitchen Co-Enzyme Q10 200 MG CAPS Take 1 tablet by mouth. Taking 1 every other day  . docusate sodium (COLACE) 100 MG capsule Take 100 mg by mouth daily.  Marland Kitchen escitalopram (LEXAPRO) 10 MG tablet TAKE 1 TABLET BY MOUTH ONCE DAILY  . hyoscyamine (LEVBID) 0.375 MG 12 hr tablet TAKE 1 TABLET BY MOUTH ONCE DAILY  . losartan (COZAAR) 100 MG tablet TAKE 1 TABLET BY MOUTH ONCE DAILY  . potassium chloride SA (KLOR-CON) 20 MEQ tablet TAKE 1  TABLET BY MOUTH ONCE DAILY  . rosuvastatin (CRESTOR) 20 MG tablet TAKE 1 TABLET BY MOUTH AT BEDTIME  . VENTOLIN HFA 108 (90 Base) MCG/ACT inhaler INHALE 1 PUFF INTO THE LUNGS EVERY 6 HOURS AS NEEDED WHEEZING OR SHORTNESS OFBREATH.  . [DISCONTINUED] metoprolol succinate (TOPROL-XL) 25 MG 24 hr tablet TAKE 1 TABLET BY MOUTH ONCE DAILY   No Known Allergies Recent Results (from the past 2160 hour(s))  Hemoglobin A1c     Status: None   Collection Time: 04/20/20  9:53 AM  Result Value Ref Range   Hgb A1c MFr Bld 6.3 4.6 - 6.5 %    Comment: Glycemic Control Guidelines for People with Diabetes:Non Diabetic:  <6%Goal of Therapy: <7%Additional Action Suggested:  >8%   Microalbumin / creatinine urine ratio     Status: Abnormal   Collection Time: 04/20/20  9:53 AM  Result Value Ref Range   Microalb, Ur 9.7 (H) 0.0 - 1.9 mg/dL   Creatinine,U 185.3 mg/dL   Microalb Creat Ratio 5.2 0.0 - 30.0 mg/g  VITAMIN D 25 Hydroxy (Vit-D Deficiency, Fractures)     Status: None   Collection Time: 04/20/20  9:53 AM  Result Value Ref Range   VITD 52.92 30.00 - 100.00 ng/mL  TSH     Status: None   Collection Time: 04/20/20  9:53 AM  Result Value Ref Range   TSH 1.19 0.35 - 4.50 uIU/mL  Vitamin B12     Status: Abnormal   Collection Time: 04/20/20  9:53 AM  Result Value Ref Range   Vitamin B-12 >1526 (H) 211 - 911 pg/mL  Lipid panel     Status: Abnormal   Collection Time: 04/20/20  9:53 AM  Result Value Ref Range   Cholesterol 106 0 - 200 mg/dL    Comment: ATP III Classification       Desirable:  < 200 mg/dL               Borderline High:  200 - 239 mg/dL          High:  > = 240 mg/dL   Triglycerides 151.0 (H) 0.0 - 149.0 mg/dL    Comment: Normal:  <150 mg/dLBorderline High:  150 - 199 mg/dL   HDL 31.40 (L) >39.00 mg/dL   VLDL 30.2 0.0 - 40.0 mg/dL   LDL Cholesterol 45 0 - 99 mg/dL   Total CHOL/HDL Ratio 3     Comment:  Men          Women1/2 Average Risk     3.4          3.3Average Risk           5.0          4.42X Average Risk          9.6          7.13X Average Risk          15.0          11.0                       NonHDL 74.85     Comment: NOTE:  Non-HDL goal should be 30 mg/dL higher than patient's LDL goal (i.e. LDL goal of < 70 mg/dL, would have non-HDL goal of < 100 mg/dL)  Comprehensive metabolic panel     Status: Abnormal   Collection Time: 04/20/20  9:53 AM  Result Value Ref Range   Sodium 138 135 - 145 mEq/L   Potassium 4.0 3.5 - 5.1 mEq/L   Chloride 102 96 - 112 mEq/L   CO2 28 19 - 32 mEq/L   Glucose, Bld 92 70 - 99 mg/dL   BUN 20 6 - 23 mg/dL   Creatinine, Ser 1.87 (H) 0.40 - 1.20 mg/dL   Total Bilirubin 0.8 0.2 - 1.2 mg/dL   Alkaline Phosphatase 64 39 - 117 U/L   AST 15 0 - 37 U/L   ALT 12 0 - 35 U/L   Total Protein 7.3 6.0 - 8.3 g/dL   Albumin 4.2 3.5 - 5.2 g/dL   GFR 25.42 (L) >60.00 mL/min    Comment: Calculated using the CKD-EPI Creatinine Equation (2021)   Calcium 9.3 8.4 - 10.5 mg/dL  Basic metabolic panel     Status: Abnormal   Collection Time: 04/26/20  9:03 AM  Result Value Ref Range   Sodium 139 135 - 145 mEq/L   Potassium 3.6 3.5 - 5.1 mEq/L   Chloride 102 96 - 112 mEq/L   CO2 30 19 - 32 mEq/L   Glucose, Bld 90 70 - 99 mg/dL   BUN 13 6 - 23 mg/dL   Creatinine, Ser 1.38 (H) 0.40 - 1.20 mg/dL   GFR 36.60 (L) >60.00 mL/min    Comment: Calculated using the CKD-EPI Creatinine Equation (2021)   Calcium 8.8 8.4 - 10.5 mg/dL  Basic metabolic panel     Status: Abnormal   Collection Time: 05/25/20  9:10 AM  Result Value Ref Range   Sodium 140 135 - 145 mEq/L   Potassium 3.8 3.5 - 5.1 mEq/L   Chloride 103 96 - 112 mEq/L   CO2 32 19 - 32 mEq/L   Glucose, Bld 86 70 - 99 mg/dL   BUN 11 6 - 23 mg/dL   Creatinine, Ser 1.03 0.40 - 1.20 mg/dL   GFR 51.96 (L) >60.00 mL/min    Comment: Calculated using the CKD-EPI Creatinine Equation (2021)   Calcium 9.4 8.4 - 10.5 mg/dL   Objective  Body mass index is 24.65 kg/m. Wt Readings from Last 3 Encounters:   07/08/20 141 lb 6 oz (64.1 kg)  02/26/20 150 lb 9.6 oz (68.3 kg)  01/21/20 148 lb (67.1 kg)   Temp Readings from Last 3 Encounters:  07/08/20 97.8 F (36.6 C)  02/26/20 98.1 F (36.7 C) (Oral)  01/21/20 98 F (36.7 C) (Oral)   BP Readings from Last 3 Encounters:  07/08/20 (!) 160/100  02/26/20 (!) 150/92  01/21/20 (!) 157/78   Pulse Readings from Last 3 Encounters:  07/08/20 88  02/26/20 60  01/21/20 61    Physical Exam Vitals and nursing note reviewed.  Constitutional:      Appearance: Normal appearance. She is well-developed and well-groomed.  HENT:     Head: Normocephalic and atraumatic.  Cardiovascular:     Rate and Rhythm: Normal rate. Rhythm irregular.     Heart sounds: Normal heart sounds.     Comments: In afib  Pulmonary:     Effort: Pulmonary effort is normal.     Breath sounds: Normal breath sounds.  Skin:    General: Skin is warm and dry.  Neurological:     General: No focal deficit present.     Mental Status: She is alert and oriented to person, place, and time. Mental status is at baseline.     Gait: Gait normal.  Psychiatric:        Attention and Perception: Attention and perception normal.        Mood and Affect: Mood and affect normal.        Speech: Speech normal.        Behavior: Behavior normal. Behavior is cooperative.        Thought Content: Thought content normal.        Cognition and Memory: Cognition and memory normal.        Judgment: Judgment normal.     Assessment  Plan   Atrial fibrillation, new Dx - Plan: Ambulatory referral to Cardiology, metoprolol succinate (TOPROL-XL) 25 MG 24 hr tablet  Of note meds may change with cards but also needs BP controlled  FH MI consider w/u to r/o CAD, consider echo, CT calcium score in the future  EKG + Afib referred to [facility] cards stat for hopefull appt 07/09/20   HTN uncontrolled - Plan: r/o RAS US Renal Artery Stenosis, amLODipine (NORVASC) 5 MG tablet, EKG 12-Lead, Ambulatory referral to  Cardiology, metoprolol succinate (TOPROL-XL) 25 MG 24 hr tablet See above  Losartan 100 mg qhs she takes and toprol 25 mg qhs will try norvasc 5 mg qam but this may be changed with cards with new Dx of Afrib   Provider: Dr. Olivia Mackie McLean-Scocuzza-Internal Medicine

## 2020-07-08 NOTE — Patient Instructions (Addendum)
Atrial Fibrillation  Atrial fibrillation is a type of irregular or rapid heartbeat (arrhythmia). In atrial fibrillation, the top part of the heart (atria) beats in an irregular pattern. This makes the heart unable to pump blood normally and effectively. The goal of treatment is to prevent blood clots from forming, control your heart rate, or restore your heartbeat to a normal rhythm. If this condition is not treated, it can cause serious problems, such as a weakened heart muscle (cardiomyopathy) or a stroke. What are the causes? This condition is often caused by medical conditions that damage the heart's electrical system. These include:  High blood pressure (hypertension). This is the most common cause.  Certain heart problems or conditions, such as heart failure, coronary artery disease, heart valve problems, or heart surgery.  Diabetes.  Overactive thyroid (hyperthyroidism).  Obesity.  Chronic kidney disease. In some cases, the cause of this condition is not known. What increases the risk? This condition is more likely to develop in:  Older people.  People who smoke.  Athletes who do endurance exercise.  People who have a family history of atrial fibrillation.  Men.  People who use drugs.  People who drink a lot of alcohol.  People who have lung conditions, such as emphysema, pneumonia, or COPD.  People who have obstructive sleep apnea. What are the signs or symptoms? Symptoms of this condition include:  A feeling that your heart is racing or beating irregularly.  Discomfort or pain in your chest.  Shortness of breath.  Sudden light-headedness or weakness.  Tiring easily during exercise or activity.  Fatigue.  Syncope (fainting).  Sweating. In some cases, there are no symptoms. How is this diagnosed? Your health care provider may detect atrial fibrillation when taking your pulse. If detected, this condition may be diagnosed with:  An electrocardiogram  (ECG) to check electrical signals of the heart.  An ambulatory cardiac monitor to record your heart's activity for a few days.  A transthoracic echocardiogram (TTE) to create pictures of your heart.  A transesophageal echocardiogram (TEE) to create even closer pictures of your heart.  A stress test to check your blood supply while you exercise.  Imaging tests, such as a CT scan or chest X-ray.  Blood tests. How is this treated? Treatment depends on underlying conditions and how you feel when you experience atrial fibrillation. This condition may be treated with:  Medicines to prevent blood clots or to treat heart rate or heart rhythm problems.  Electrical cardioversion to reset the heart's rhythm.  A pacemaker to correct abnormal heart rhythm.  Ablation to remove the heart tissue that sends abnormal signals.  Left atrial appendage closure to seal the area where blood clots can form. In some cases, underlying conditions will be treated. Follow these instructions at home: Medicines  Take over-the counter and prescription medicines only as told by your health care provider.  Do not take any new medicines without talking to your health care provider.  If you are taking blood thinners: ? Talk with your health care provider before you take any medicines that contain aspirin or NSAIDs, such as ibuprofen. These medicines increase your risk for dangerous bleeding. ? Take your medicine exactly as told, at the same time every day. ? Avoid activities that could cause injury or bruising, and follow instructions about how to prevent falls. ? Wear a medical alert bracelet or carry a card that lists what medicines you take. Lifestyle  Do not use any products that contain nicotine or  tobacco, such as cigarettes, e-cigarettes, and chewing tobacco. If you need help quitting, ask your health care provider.  Eat heart-healthy foods. Talk with a dietitian to make an eating plan that is right for  you.  Exercise regularly as told by your health care provider.  Do not drink alcohol.  Lose weight if you are overweight.  Do not use drugs, including cannabis.      General instructions  If you have obstructive sleep apnea, manage your condition as told by your health care provider.  Do not use diet pills unless your health care provider approves. Diet pills can make heart problems worse.  Keep all follow-up visits as told by your health care provider. This is important. Contact a health care provider if you:  Notice a change in the rate, rhythm, or strength of your heartbeat.  Are taking a blood thinner and you notice more bruising.  Tire more easily when you exercise or do heavy work.  Have a sudden change in weight. Get help right away if you have:  Chest pain, abdominal pain, sweating, or weakness.  Trouble breathing.  Side effects of blood thinners, such as blood in your vomit, stool, or urine, or bleeding that cannot stop.  Any symptoms of a stroke. "BE FAST" is an easy way to remember the main warning signs of a stroke: ? B - Balance. Signs are dizziness, sudden trouble walking, or loss of balance. ? E - Eyes. Signs are trouble seeing or a sudden change in vision. ? F - Face. Signs are sudden weakness or numbness of the face, or the face or eyelid drooping on one side. ? A - Arms. Signs are weakness or numbness in an arm. This happens suddenly and usually on one side of the body. ? S - Speech. Signs are sudden trouble speaking, slurred speech, or trouble understanding what people say. ? T - Time. Time to call emergency services. Write down what time symptoms started.  Other signs of a stroke, such as: ? A sudden, severe headache with no known cause. ? Nausea or vomiting. ? Seizure. These symptoms may represent a serious problem that is an emergency. Do not wait to see if the symptoms will go away. Get medical help right away. Call your local emergency services  (911 in the [place] Do not drive yourself to the hospital.   Summary  Atrial fibrillation is a type of irregular or rapid heartbeat (arrhythmia).  Symptoms include a feeling that your heart is beating fast or irregularly.  You may be given medicines to prevent blood clots or to treat heart rate or heart rhythm problems.  Get help right away if you have signs or symptoms of a stroke.  Get help right away if you cannot catch your breath or have chest pain or pressure. This information is not intended to replace advice given to you by your health care provider. Make sure you discuss any questions you have with your health care provider. Document Revised: 10/23/2018 Document Reviewed: 10/23/2018 Elsevier Patient Education  2021 [place] Eating Plan DASH stands for Dietary Approaches to Stop Hypertension. The DASH eating plan is a healthy eating plan that has been shown to:  Reduce high blood pressure (hypertension).  Reduce your risk for type 2 diabetes, heart disease, and stroke.  Help with weight loss. What are tips for following this plan? Reading food labels  Check food labels for the amount of salt (sodium) per serving. Choose foods with less than  5 percent of the Daily Value of sodium. Generally, foods with less than 300 milligrams (mg) of sodium per serving fit into this eating plan.  To find whole grains, look for the word "whole" as the first word in the ingredient list. Shopping  Buy products labeled as "low-sodium" or "no salt added."  Buy fresh foods. Avoid canned foods and pre-made or frozen meals. Cooking  Avoid adding salt when cooking. Use salt-free seasonings or herbs instead of table salt or sea salt. Check with your health care provider or pharmacist before using salt substitutes.  Do not fry foods. Cook foods using healthy methods such as baking, boiling, grilling, roasting, and broiling instead.  Cook with heart-healthy oils, such as olive, canola,  avocado, soybean, or sunflower oil. Meal planning  Eat a balanced diet that includes: ? 4 or more servings of fruits and 4 or more servings of vegetables each day. Try to fill one-half of your plate with fruits and vegetables. ? 6-8 servings of whole grains each day. ? Less than 6 oz (170 g) of lean meat, poultry, or fish each day. A 3-oz (85-g) serving of meat is about the same size as a deck of cards. One egg equals 1 oz (28 g). ? 2-3 servings of low-fat dairy each day. One serving is 1 cup (237 mL). ? 1 serving of nuts, seeds, or beans 5 times each week. ? 2-3 servings of heart-healthy fats. Healthy fats called omega-3 fatty acids are found in foods such as walnuts, flaxseeds, fortified milks, and eggs. These fats are also found in cold-water fish, such as sardines, salmon, and mackerel.  Limit how much you eat of: ? Canned or prepackaged foods. ? Food that is high in trans fat, such as some fried foods. ? Food that is high in saturated fat, such as fatty meat. ? Desserts and other sweets, sugary drinks, and other foods with added sugar. ? Full-fat dairy products.  Do not salt foods before eating.  Do not eat more than 4 egg yolks a week.  Try to eat at least 2 vegetarian meals a week.  Eat more home-cooked food and less restaurant, buffet, and fast food.   Lifestyle  When eating at a restaurant, ask that your food be prepared with less salt or no salt, if possible.  If you drink alcohol: ? Limit how much you use to:  0-1 drink a day for women who are not pregnant.  0-2 drinks a day for men. ? Be aware of how much alcohol is in your drink. In the [place] one drink equals one 12 oz bottle of beer (355 mL), one 5 oz glass of wine (148 mL), or one 1 oz glass of hard liquor (44 mL). General information  Avoid eating more than 2,300 mg of salt a day. If you have hypertension, you may need to reduce your sodium intake to 1,500 mg a day.  Work with your health care provider to  maintain a healthy body weight or to lose weight. Ask what an ideal weight is for you.  Get at least 30 minutes of exercise that causes your heart to beat faster (aerobic exercise) most days of the week. Activities may include walking, swimming, or biking.  Work with your health care provider or dietitian to adjust your eating plan to your individual calorie needs. What foods should I eat? Fruits All fresh, dried, or frozen fruit. Canned fruit in natural juice (without added sugar). Vegetables Fresh or frozen vegetables (raw, steamed,  roasted, or grilled). Low-sodium or reduced-sodium tomato and vegetable juice. Low-sodium or reduced-sodium tomato sauce and tomato paste. Low-sodium or reduced-sodium canned vegetables. Grains Whole-grain or whole-wheat bread. Whole-grain or whole-wheat pasta. Brown rice. [place]. Bulgur. Whole-grain and low-sodium cereals. Pita bread. Low-fat, low-sodium crackers. Whole-wheat flour tortillas. Meats and other proteins Skinless chicken or [place]. Ground chicken or [place]. Pork with fat trimmed off. Fish and seafood. Egg whites. Dried beans, peas, or lentils. Unsalted nuts, nut butters, and seeds. Unsalted canned beans. Lean cuts of beef with fat trimmed off. Low-sodium, lean precooked or cured meat, such as sausages or meat loaves. Dairy Low-fat (1%) or fat-free (skim) milk. Reduced-fat, low-fat, or fat-free cheeses. Nonfat, low-sodium ricotta or cottage cheese. Low-fat or nonfat yogurt. Low-fat, low-sodium cheese. Fats and oils Soft margarine without trans fats. Vegetable oil. Reduced-fat, low-fat, or light mayonnaise and salad dressings (reduced-sodium). Canola, safflower, olive, avocado, soybean, and sunflower oils. Avocado. Seasonings and condiments Herbs. Spices. Seasoning mixes without salt. Other foods Unsalted popcorn and pretzels. Fat-free sweets. The items listed above may not be a complete list of foods and beverages you can eat. Contact a  dietitian for more information. What foods should I avoid? Fruits Canned fruit in a light or heavy syrup. Fried fruit. Fruit in cream or butter sauce. Vegetables Creamed or fried vegetables. Vegetables in a cheese sauce. Regular canned vegetables (not low-sodium or reduced-sodium). Regular canned tomato sauce and paste (not low-sodium or reduced-sodium). Regular tomato and vegetable juice (not low-sodium or reduced-sodium). Angie Fava. Olives. Grains Baked goods made with fat, such as croissants, muffins, or some breads. Dry pasta or rice meal packs. Meats and other proteins Fatty cuts of meat. Ribs. Fried meat. Berniece Salines. Bologna, salami, and other precooked or cured meats, such as sausages or meat loaves. Fat from the back of a pig (fatback). Bratwurst. Salted nuts and seeds. Canned beans with added salt. Canned or smoked fish. Whole eggs or egg yolks. Chicken or [place] with skin. Dairy Whole or 2% milk, cream, and half-and-half. Whole or full-fat cream cheese. Whole-fat or sweetened yogurt. Full-fat cheese. Nondairy creamers. Whipped toppings. Processed cheese and cheese spreads. Fats and oils Butter. Stick margarine. Lard. Shortening. Ghee. Bacon fat. Tropical oils, such as coconut, palm kernel, or palm oil. Seasonings and condiments Onion salt, garlic salt, seasoned salt, table salt, and sea salt. Worcestershire sauce. Tartar sauce. Barbecue sauce. Teriyaki sauce. Soy sauce, including reduced-sodium. Steak sauce. Canned and packaged gravies. Fish sauce. Oyster sauce. Cocktail sauce. Store-bought horseradish. Ketchup. Mustard. Meat flavorings and tenderizers. Bouillon cubes. Hot sauces. Pre-made or packaged marinades. Pre-made or packaged taco seasonings. Relishes. Regular salad dressings. Other foods Salted popcorn and pretzels. The items listed above may not be a complete list of foods and beverages you should avoid. Contact a dietitian for more information. Where to find more  information  National Heart, Lung, and Blood Institute: https://wilson-eaton.com/  American Heart Association: www.heart.org  Academy of Nutrition and Dietetics: www.eatright.[place]: www.kidney.org Summary  The DASH eating plan is a healthy eating plan that has been shown to reduce high blood pressure (hypertension). It may also reduce your risk for type 2 diabetes, heart disease, and stroke.  When on the DASH eating plan, aim to eat more fresh fruits and vegetables, whole grains, lean proteins, low-fat dairy, and heart-healthy fats.  With the DASH eating plan, you should limit salt (sodium) intake to 2,300 mg a day. If you have hypertension, you may need to reduce your sodium intake to 1,500 mg a  day.  Work with your health care provider or dietitian to adjust your eating plan to your individual calorie needs. This information is not intended to replace advice given to you by your health care provider. Make sure you discuss any questions you have with your health care provider. Document Revised: 04/04/2019 Document Reviewed: 04/04/2019 Elsevier Patient Education  2021 [place].  Atherosclerosis  Atherosclerosis is narrowing and hardening of the arteries. Arteries are blood vessels that carry blood from the heart to all parts of the body. This blood contains oxygen. Arteries can become narrow or blocked from inflammation or from a buildup of fat, cholesterol, calcium, and other substances (plaque). Plaque decreases the amount of blood that can flow through the artery. Atherosclerosis can affect any artery in your body, including:  Heart arteries. Damage to these arteries may lead to coronary artery disease, which can cause a heart attack.  Brain arteries. Damage to these arteries may cause a stroke.  Leg, arm, and pelvis arteries. Peripheral artery disease (PAD) may result from damage to these arteries.  Kidney arteries. Kidney (renal) failure may result from  damage to kidney arteries. Treatment may slow the disease and prevent further damage to your heart, brain, peripheral arteries, and kidneys. What are the causes? This condition develops slowly over many years. The inner layers of your arteries become damaged and allow the gradual buildup of plaque. The exact cause of atherosclerosis is not fully understood. Symptoms of atherosclerosis do not occur until an artery becomes narrow or blocked. What increases the risk? The following factors may make you more likely to develop this condition:  Being middle-aged or older.  Certain medical conditions, including: ? High blood pressure. ? High cholesterol. ? High blood fats (triglycerides). ? Diabetes. ? Sleep apnea.  A family history of atherosclerosis.  Being overweight.  Using products that contain tobacco or nicotine.  Not exercising enough (sedentary lifestyle).  Having a substance in your blood called C-reactive protein (CRP). This is a sign of increased levels of inflammation in your body.  Being stressed.  Drinking too much alcohol or using drugs, such as cocaine or methamphetamine. What are the signs or symptoms? Symptoms of atherosclerosis may not be obvious until there is damage to an area of your body that is not getting enough blood. Sometimes, atherosclerosis does not cause symptoms. Symptoms of this condition include:  Coronary artery disease. This may cause chest pain and shortness of breath.  Decreased blood supply to your brain, which may cause a stroke. Signs of a stroke may include sudden: ? Weakness or numbness in your face, arm, or leg, especially on one side of your body. ? Trouble walking or difficulty moving your arms or legs. ? Loss of balance or coordination. ? Confusion. ? Slurred speech (dysarthria). ? Trouble speaking, or trouble understanding speech, or both (aphasia). ? Vision changes in one or both eyes. This may be double vision, blurred vision, or  loss of vision. ? Severe headache with no known cause. The headache is often described as the worst headache ever experienced.  PAD, which may cause pain and numbness, often in your legs and hips.  Renal failure. This may cause tiredness, problems with urination, swelling, and itchy skin. How is this diagnosed? This condition is diagnosed based on your medical history and a physical exam. During the exam, your health care provider will:  Check your pulse in different places.  Listen for a "whooshing" sound over your arteries (bruit). You may also have tests, such  as:  Blood tests to check your levels of cholesterol, triglycerides, and CRP.  Electrocardiogram (ECG) to check for heart damage.  Chest X-ray to see if you have an enlarged heart, which is a sign of heart failure.  Stress test to see how your heart reacts to exercise.  Echocardiogram to get images of the inside of your heart.  Ankle-brachial index to compare blood pressure in your arms to blood pressure in your ankles.  Ultrasound of your peripheral arteries to check blood flow.  CT scan to check for damage to your heart or brain.  X-rays of blood vessels after dye has been injected (angiogram) to check blood flow. How is this treated? This condition is treated with lifestyle changes as the first step. These may include:  Changing your diet.  Losing weight.  Reducing stress.  Exercising and being physically active more regularly.  Quitting smoking. You may also need medicine to:  Lower triglycerides and cholesterol.  Control blood pressure.  Prevent blood clots.  Lower inflammation in your body.  Control your blood sugar. Sometimes, surgery is needed to:  Remove plaque from an artery (endarterectomy).  Open or widen a narrowed heart artery (angioplasty).  Create a new path for your blood with one of these procedures: ? Heart (coronary) artery bypass graft surgery. ? Peripheral artery bypass  graft surgery. Follow these instructions at home: Eating and drinking  Eat a heart-healthy diet. Talk with your health care provider or a diet and nutrition specialist (dietitian) if you need help. A heart-healthy diet involves: ? Limiting unhealthy fats and increasing healthy fats. Some examples of healthy fats are olive oil and canola oil. ? Eating plant-based foods, such as fruits, vegetables, nuts, whole grains, and legumes (such as peas and lentils).  If you drink alcohol: ? Limit how much you use to:  0-1 drink a day for nonpregnant women.  0-2 drinks a day for men. ? Be aware of how much alcohol is in your drink. In the [place] one drink equals one 12 oz bottle of beer (355 mL), one 5 oz glass of wine (148 mL), or one 1 oz glass of hard liquor (44 mL).   Lifestyle  Maintain a healthy weight. Lose weight if your health care provider says that you need to do that.  Follow an exercise program as told by your health care provider.  Do not use any products that contain nicotine or tobacco, such as cigarettes, e-cigarettes, and chewing tobacco. If you need help quitting, ask your health care provider.  Do not use drugs.   General instructions  Take over-the-counter and prescription medicines only as told by your health care provider.  Manage other health conditions as told.  Keep all follow-up visits as told by your health care provider. This is important. Contact a health care provider if you have:  An irregular heartbeat.  Unexplained fatigue.  Trouble urinating, or you are producing less urine or foamy urine.  Swelling of your hands or feet, or itchy skin.  Unexplained pain or numbness in your legs or hips. Get help right away if:  You have any symptoms of a heart attack. These may be: ? Chest pain. This includes squeezing chest pain that may feel like indigestion (angina). ? Shortness of breath. ? Pain in your neck, jaw, arms, back, or stomach. ? Cold  sweat. ? Nausea. ? Light-headedness.  You have any symptoms of a stroke. "BE FAST" is an easy way to remember the main warning signs of  a stroke: ? B - Balance. Signs are dizziness, sudden trouble walking, or loss of balance. ? E - Eyes. Signs are trouble seeing or a sudden change in vision. ? F - Face. Signs are sudden weakness or numbness of the face, or the face or eyelid drooping on one side. ? A - Arms. Signs are weakness or numbness in an arm. This happens suddenly and usually on one side of the body. ? S - Speech. Signs are sudden trouble speaking, slurred speech, or trouble understanding what people say. ? T - Time. Time to call emergency services. Write down what time symptoms started.  You have other signs of a stroke, such as: ? A sudden, severe headache with no known cause. ? Nausea or vomiting. ? Seizure. These symptoms may represent a serious problem that is an emergency. Do not wait to see if the symptoms will go away. Get medical help right away. Call your local emergency services (911 in the [place] Do not drive yourself to the hospital. Summary  Atherosclerosis is narrowing and hardening of the arteries.  Arteries can become narrow from inflammation or from a buildup of fat, cholesterol, calcium, and other substances (plaque).  This condition may not cause any symptoms. If you do have symptoms, they are caused by damage to an area of your body that is not getting enough blood.  Treatment starts with lifestyle changes and may include medicines. In some cases, surgery is needed.  Get help right away if you have any symptoms of a heart attack or stroke. This information is not intended to replace advice given to you by your health care provider. Make sure you discuss any questions you have with your health care provider. Document Revised: 03/10/2019 Document Reviewed: 03/10/2019 Elsevier Patient Education  2021 [place].  Renal Artery Stenosis Renal artery stenosis  (RAS) is a narrowing of the artery that carries blood to the kidneys. It can affect one or both kidneys. The kidneys filter waste and extra fluid from the blood. Waste and fluid are then removed when a person passes urine. The kidneys also make an important chemical messenger (hormone) called renin. Renin helps regulate blood pressure. The first sign of RAS may be high blood pressure. Other symptoms can develop over time. What are the causes? A common cause of this condition is plaque buildup in your arteries (atherosclerosis). The plaques that cause this are made up of:  Fat.  Cholesterol.  Calcium.  Other substances. As these substances build up in your renal artery, the blood supply to your kidneys slows. The lack of blood and oxygen causes the signs and symptoms of RAS. A much less common cause of RAS is a disease called fibromuscular dysplasia. This disease causes abnormal cell growth that narrows the renal artery. It is not related to atherosclerosis. It occurs mostly in women who are 61-31 years old. It may be passed down through families.    What increases the risk? You are more likely to develop this condition if you:  Are a man who is at least 79 years old.  Are a woman who is at least 79 years old.  Have high blood pressure.  Have high cholesterol.  Are a smoker.  Abuse alcohol.  Have diabetes or prediabetes.  Are overweight or obese.  Have a family history of early heart disease. What are the signs or symptoms? RAS usually develops slowly. You may not have any signs or symptoms at first. Early signs may include:  Development  of high blood pressure.  A sudden increase in existing high blood pressure.  No longer responding to medicine that used to control your blood pressure. Later signs and symptoms are due to kidney damage. They may include:  Feeling tired (fatigue).  Shortness of breath.  Swollen legs and feet.  Dry skin.  Headaches.  Muscle  cramps.  Loss of appetite.  Nausea or vomiting. How is this diagnosed? This condition may be diagnosed based on:  Your symptoms and medical history. Your health care provider may suspect RAS based on changes in your blood pressure and your risk factors.  A physical exam. During the exam, your health care provider will use a stethoscope to listen for a whooshing sound (bruit) that can occur where the renal artery is blocking blood flow.  Various tests. These may include: ? Blood and urine tests to check your kidney function. ? Imaging tests of your kidneys, such as:  A test that uses sound waves to create an image of your kidneys and the blood flow to your kidneys (ultrasound).  A test in which dye is injected into one of your blood vessels so images can be taken as the dye flows through your renal arteries (angiogram). This can be done using X-rays, a CT scan (computed tomography angiogram, CTA), or a type of MRI (magnetic resonance angiogram, MRA). How is this treated? Making lifestyle changes to reduce your risk factors is the first treatment option for early RAS. If the blood flow to one of your kidneys is cut by more than half, you may need medicine to:  Lower your blood pressure. This is the main medical treatment for RAS. You may need more than one type of medicine for this. The types that work best for people with RAS are: ? ACE inhibitors. ? Angiotensin receptor blockers.  Reduce fluid in the body (diuretics).  Lower your cholesterol (statins). If medicine is not enough to control RAS, you may need surgery. This may involve:  Threading a tube with an inflatable balloon into the renal artery to force it to open (angioplasty).  Removing plaque from inside the artery (endarterectomy).   Follow these instructions at home: Lifestyle  Make any lifestyle changes recommended by your health care provider. This may include: ? Working with a dietitian to maintain a heart-healthy  diet. This type of diet is low in saturated fat, salt, and added sugar. ? Starting an exercise program as directed by your health care provider. ? Maintaining a healthy weight. ? Quitting smoking. ? Not abusing alcohol. General instructions  Take over-the-counter and prescription medicines only as told by your health care provider.  Keep all follow-up visits as told by your health care provider. This is important.   Contact a health care provider if:  Your symptoms of RAS are not getting better.  Your symptoms are changing or getting worse. Get help right away if you have:  Very bad pain in your back or abdomen.  Blood in your urine. Summary  Renal artery stenosis (RAS) is a narrowing of the artery that carries blood to the kidneys. It can affect one or both kidneys.  RAS usually develops slowly. You may not have any signs or symptoms at first, but high blood pressure that is difficult to control is a key symptom.  Making lifestyle changes to reduce your risk factors is the first treatment option for early RAS. If the blood flow to one of your kidneys is cut by more than half, you may  need medicines to help manage your cholesterol and blood pressure. This information is not intended to replace advice given to you by your health care provider. Make sure you discuss any questions you have with your health care provider. Document Revised: 05/28/2017 Document Reviewed: 05/28/2017 Elsevier Patient Education  2021 [employer].

## 2020-07-09 ENCOUNTER — Other Ambulatory Visit: Payer: Self-pay

## 2020-07-09 ENCOUNTER — Encounter: Payer: Self-pay | Admitting: Cardiology

## 2020-07-09 ENCOUNTER — Ambulatory Visit (INDEPENDENT_AMBULATORY_CARE_PROVIDER_SITE_OTHER): Payer: Medicare Other | Admitting: Cardiology

## 2020-07-09 VITALS — BP 144/98 | HR 134 | Ht 63.0 in | Wt 141.0 lb

## 2020-07-09 DIAGNOSIS — R0609 Other forms of dyspnea: Secondary | ICD-10-CM

## 2020-07-09 DIAGNOSIS — I10 Essential (primary) hypertension: Secondary | ICD-10-CM | POA: Diagnosis not present

## 2020-07-09 DIAGNOSIS — I4819 Other persistent atrial fibrillation: Secondary | ICD-10-CM | POA: Diagnosis not present

## 2020-07-09 DIAGNOSIS — R0600 Dyspnea, unspecified: Secondary | ICD-10-CM

## 2020-07-09 DIAGNOSIS — E7800 Pure hypercholesterolemia, unspecified: Secondary | ICD-10-CM

## 2020-07-09 MED ORDER — METOPROLOL TARTRATE 25 MG PO TABS: 25.0000 mg | ORAL_TABLET | Freq: Two times a day (BID) | ORAL | 3 refills | Status: DC

## 2020-07-09 MED ORDER — APIXABAN 5 MG PO TABS: 5.0000 mg | ORAL_TABLET | Freq: Two times a day (BID) | ORAL | 5 refills | Status: DC

## 2020-07-09 NOTE — Patient Instructions (Signed)
Medication Instructions:   1.  STOP taking Toprol XL (Metoprolol Succinate)  2.  START taking Lopressor (Metoprolol Tartrate) 25 MG: Take 1 tab by mouth twice a day.  3.  START taking Eliquis 5 MG: Take 1 tab by mouth twice a day.  *If you need a refill on your cardiac medications before your next appointment, please call your pharmacy*   Lab Work: None ordered If you have labs (blood work) drawn today and your tests are completely normal, you will receive your results only by: Marland Kitchen MyChart Message (if you have MyChart) OR . A paper copy in the mail If you have any lab test that is abnormal or we need to change your treatment, we will call you to review the results.   Testing/Procedures:  1.  Your physician has requested that you have an echocardiogram. Echocardiography is a painless test that uses sound waves to create images of your heart. It provides your doctor with information about the size and shape of your heart and how well your heart's chambers and valves are working. This procedure takes approximately one hour. There are no restrictions for this procedure.     Follow-Up: At [facility], you and your health needs are our priority.  As part of our continuing mission to provide you with exceptional heart care, we have created designated Provider Care Teams.  These Care Teams include your primary Cardiologist (physician) and Advanced Practice Providers (APPs -  Physician Assistants and Nurse Practitioners) who all work together to provide you with the care you need, when you need it.  We recommend signing up for the patient portal called "MyChart".  Sign up information is provided on this After Visit Summary.  MyChart is used to connect with patients for Virtual Visits (Telemedicine).  Patients are able to view lab/test results, encounter notes, upcoming appointments, etc.  Non-urgent messages can be sent to your provider as well.   To learn more about what you can do with  MyChart, go to [email or web address].    Your next appointment:   Follow up after Echo   The format for your next appointment:   In Person  Provider:   [name], MD  ONLY   Other Instructions

## 2020-07-09 NOTE — Progress Notes (Signed)
Cardiology Office Note:    Date:  07/09/2020   ID:  Whitney Molina, DOB 08/23/1940, MRN [id number]  PCP:  [name], MD   [place]  Cardiologist:  [name], MD  Advanced Practice Provider:  No care team member to display Electrophysiologist:  None       Referring MD: [name], MD   Chief Complaint  Patient presents with  . Other    HTN/AFIB/Irregular heart beat. Meds reviewed verbally with pt.    History of Present Illness:    Whitney Molina is a 80 y.o. female with a hx of hypertension, hyperlipidemia, atrial fibrillation who presents due to atrial fibrillation and hypertension.  Patient with known history of hypertension for years, has been on several BP meds.  Previously was on Maxzide which caused AKI.  BPs have been elevated of late with systolics in the 250I.  She was started on Norvasc yesterday with significant improvement in blood pressure.  Saw primary care provider yesterday where the EKG in the office showed atrial fibrillation, heart rate 126.  She endorses dyspnea on exertion but denies chest pain or edema.  Endorses irregular heartbeats.    Past Medical History:  Diagnosis Date  . Basal cell carcinoma of nose   . Bronchitis   . GERD (gastroesophageal reflux disease)   . Heart murmur   . Hemorrhoids   . History of basal cell carcinoma    right upper lip, right ant shoulder  . History of colon polyps   . History of dysplastic nevus 12/05/2001   left paraspinal upper back, upper back spinal  . History of kidney stones   . History of mammogram 2016  . History of squamous cell carcinoma 02/07/2005   left ant chest  . Hyperlipidemia   . Hypertension   . Non Hodgkin's lymphoma ([place])   . 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 ABDOMINAL HYSTERECTOMY  1984  . BLADDER SUSPENSION  2010  . CHOLECYSTECTOMY   06-01-03  . COLONOSCOPY  2003  . LITHOTRIPSY  2005  . local exicsion skin cancer  2004   squamous cell of chest wall. left chest  . OOPHORECTOMY    . TONSILLECTOMY  1971  . TUBAL LIGATION  1975  . VAGINAL PROLAPSE REPAIR  July 2001   Pelvic Prolapse    Current Medications: Current Meds  Medication Sig  . amLODipine (NORVASC) 5 MG tablet 5 mg daily x 1 week in the am. If BP >130/>80 increase to 10 mg norvasc daily after 1 week in the am.  D/c toprol 25 mg xl qd.  . apixaban (ELIQUIS) 5 MG TABS tablet Take 1 tablet (5 mg total) by mouth 2 (two) times daily.  . Cholecalciferol (VITAMIN D3) 1000 units CAPS Take 1,000 Units by mouth daily.  Marland Kitchen Co-Enzyme Q10 200 MG CAPS Take 1 tablet by mouth. Taking 1 every other day  . docusate sodium (COLACE) 100 MG capsule Take 100 mg by mouth daily.  Marland Kitchen escitalopram (LEXAPRO) 10 MG tablet TAKE 1 TABLET BY MOUTH ONCE DAILY  . hyoscyamine (LEVBID) 0.375 MG 12 hr tablet TAKE 1 TABLET BY MOUTH ONCE DAILY  . losartan (COZAAR) 100 MG tablet TAKE 1 TABLET BY MOUTH ONCE DAILY  . metoprolol tartrate (LOPRESSOR) 25 MG tablet Take 1 tablet (25 mg total) by mouth 2 (two) times daily.  . potassium  chloride SA (KLOR-CON) 20 MEQ tablet TAKE 1 TABLET BY MOUTH ONCE DAILY  . rosuvastatin (CRESTOR) 20 MG tablet TAKE 1 TABLET BY MOUTH AT BEDTIME  . VENTOLIN HFA 108 (90 Base) MCG/ACT inhaler INHALE 1 PUFF INTO THE LUNGS EVERY 6 HOURS AS NEEDED WHEEZING OR SHORTNESS OFBREATH.  . [DISCONTINUED] metoprolol succinate (TOPROL-XL) 25 MG 24 hr tablet Take 1 tablet (25 mg total) by mouth daily.     Allergies:   Patient has no known allergies.   Social History   Socioeconomic History  . Marital status: Married    Spouse name: Not on file  . Number of children: Not on file  . Years of education: Not on file  . Highest education level: Not on file  Occupational History  . Not on file  Tobacco Use  . Smoking status: Never Smoker  . Smokeless tobacco: Never Used  Vaping Use   . Vaping Use: Never used  Substance and Sexual Activity  . Alcohol use: No  . Drug use: No  . Sexual activity: Yes  Other Topics Concern  . Not on file  Social History Narrative  . Not on file   Social Determinants of Health   Financial Resource Strain: Not on file  Food Insecurity: Not on file  Transportation Needs: Not on file  Physical Activity: Not on file  Stress: Not on file  Social Connections: Not on file     Family History: The patient's family history includes Bipolar disorder in her son; Breast cancer (age of onset: 61) in her paternal aunt; Breast cancer (age of onset: 7) in her mother; Cancer in her brother; Dementia in her father; Heart attack in her brother; Stroke in her brother, father, and mother.  ROS:   Please see the history of present illness.     All other systems reviewed and are negative.  EKGs/Labs/Other Studies Reviewed:    The following studies were reviewed today:   EKG:  EKG is  ordered today.  The ekg ordered today demonstrates atrial fibrillation, heart rate 134  Recent Labs: 01/21/2020: Hemoglobin 13.1; Platelets 263 04/20/2020: ALT 12; TSH 1.19 05/25/2020: BUN 11; Creatinine, Ser 1.03; Potassium 3.8; Sodium 140  Recent Lipid Panel    Component Value Date/Time   CHOL 106 04/20/2020 0953   TRIG 151.0 (H) 04/20/2020 0953   HDL 31.40 (L) 04/20/2020 0953   CHOLHDL 3 04/20/2020 0953   VLDL 30.2 04/20/2020 0953   LDLCALC 45 04/20/2020 0953   LDLDIRECT 145.0 04/01/2018 1116     Risk Assessment/Calculations:      Physical Exam:    VS:  BP (!) 144/98 (BP Location: Right Arm, Patient Position: Sitting, Cuff Size: Normal)   Pulse (!) 134   Ht 5\' 3"  (1.6 m)   Wt 141 lb (64 kg)   SpO2 98%   BMI 24.98 kg/m     Wt Readings from Last 3 Encounters:  07/09/20 141 lb (64 kg)  07/08/20 141 lb 6 oz (64.1 kg)  02/26/20 150 lb 9.6 oz (68.3 kg)     GEN:  Well nourished, well developed in no acute distress HEENT: Normal NECK: No JVD; No  carotid bruits LYMPHATICS: No lymphadenopathy CARDIAC: Irregular irregular, no murmurs RESPIRATORY:  Clear to auscultation without rales, wheezing or rhonchi  ABDOMEN: Soft, non-tender, non-distended MUSCULOSKELETAL:  No edema; No deformity  SKIN: Warm and dry NEUROLOGIC:  Alert and oriented x 3 PSYCHIATRIC:  Normal affect   ASSESSMENT:    1. Persistent atrial fibrillation ([place])  2. Primary hypertension   3. Pure hypercholesterolemia   4. Dyspnea on exertion    PLAN:    In order of problems listed above:  1. Patient with new onset atrial fibrillation diagnosed yesterday.  EKG today showing A. fib, RVR.  CHA2DS2-VASc score 4 (age, htn, gender).  Stop Toprol-XL.  Start Lopressor 25 mg twice daily.  Start Eliquis 5 mg twice daily.  Get echocardiogram.  If heart rate stays elevated in 2 to 3 days, plan to increase Lopressor to 50 mg twice daily. 2. Hypertension, BP still elevated but improved.  Continue medications as prescribed.  Plan to titrate as needed. 3. Hyperlipidemia, continue statin. 4. Dyspnea on exertion, denies chest pain.  Echocardiogram as above.  A. fib management as above.  Follow-up after echocardiogram.      Medication Adjustments/Labs and Tests Ordered: Current medicines are reviewed at length with the patient today.  Concerns regarding medicines are outlined above.  Orders Placed This Encounter  Procedures  . EKG 12-Lead  . ECHOCARDIOGRAM COMPLETE   Meds ordered this encounter  Medications  . metoprolol tartrate (LOPRESSOR) 25 MG tablet    Sig: Take 1 tablet (25 mg total) by mouth 2 (two) times daily.    Dispense:  60 tablet    Refill:  3  . apixaban (ELIQUIS) 5 MG TABS tablet    Sig: Take 1 tablet (5 mg total) by mouth 2 (two) times daily.    Dispense:  60 tablet    Refill:  5    Patient Instructions  Medication Instructions:   1.  STOP taking Toprol XL (Metoprolol Succinate)  2.  START taking Lopressor (Metoprolol Tartrate) 25 MG: Take 1 tab  by mouth twice a day.  3.  START taking Eliquis 5 MG: Take 1 tab by mouth twice a day.  *If you need a refill on your cardiac medications before your next appointment, please call your pharmacy*   Lab Work: None ordered If you have labs (blood work) drawn today and your tests are completely normal, you will receive your results only by: Marland Kitchen MyChart Message (if you have MyChart) OR . A paper copy in the mail If you have any lab test that is abnormal or we need to change your treatment, we will call you to review the results.   Testing/Procedures:  1.  Your physician has requested that you have an echocardiogram. Echocardiography is a painless test that uses sound waves to create images of your heart. It provides your doctor with information about the size and shape of your heart and how well your heart's chambers and valves are working. This procedure takes approximately one hour. There are no restrictions for this procedure.     Follow-Up: At [facility], you and your health needs are our priority.  As part of our continuing mission to provide you with exceptional heart care, we have created designated Provider Care Teams.  These Care Teams include your primary Cardiologist (physician) and Advanced Practice Providers (APPs -  Physician Assistants and Nurse Practitioners) who all work together to provide you with the care you need, when you need it.  We recommend signing up for the patient portal called "MyChart".  Sign up information is provided on this After Visit Summary.  MyChart is used to connect with patients for Virtual Visits (Telemedicine).  Patients are able to view lab/test results, encounter notes, upcoming appointments, etc.  Non-urgent messages can be sent to your provider as well.   To learn more about  what you can do with MyChart, go to [email or web address].    Your next appointment:   Follow up after Echo   The format for your next appointment:   In  Person  Provider:   [name], MD  ONLY   Other Instructions      Signed, [name], MD  07/09/2020 4:22 PM    [place]

## 2020-07-13 ENCOUNTER — Telehealth: Payer: Self-pay | Admitting: Cardiology

## 2020-07-13 NOTE — Telephone Encounter (Signed)
Patient was told to call with an update.  She reports she is doing well.

## 2020-07-19 ENCOUNTER — Telehealth: Payer: Self-pay | Admitting: *Deleted

## 2020-07-19 NOTE — Telephone Encounter (Signed)
Left vm for patient to return my phone call. I requested to move her apt on 3/9 to 3/8 if possible due to full md schedule on Wednesday.

## 2020-07-21 ENCOUNTER — Inpatient Hospital Stay: Payer: Medicare Other | Attending: Internal Medicine

## 2020-07-21 ENCOUNTER — Encounter: Payer: Self-pay | Admitting: Internal Medicine

## 2020-07-21 ENCOUNTER — Inpatient Hospital Stay (HOSPITAL_BASED_OUTPATIENT_CLINIC_OR_DEPARTMENT_OTHER): Payer: Medicare Other | Admitting: Internal Medicine

## 2020-07-21 DIAGNOSIS — Z7901 Long term (current) use of anticoagulants: Secondary | ICD-10-CM | POA: Diagnosis not present

## 2020-07-21 DIAGNOSIS — E785 Hyperlipidemia, unspecified: Secondary | ICD-10-CM | POA: Diagnosis not present

## 2020-07-21 DIAGNOSIS — Z85828 Personal history of other malignant neoplasm of skin: Secondary | ICD-10-CM | POA: Diagnosis not present

## 2020-07-21 DIAGNOSIS — R011 Cardiac murmur, unspecified: Secondary | ICD-10-CM | POA: Insufficient documentation

## 2020-07-21 DIAGNOSIS — I4891 Unspecified atrial fibrillation: Secondary | ICD-10-CM | POA: Insufficient documentation

## 2020-07-21 DIAGNOSIS — K219 Gastro-esophageal reflux disease without esophagitis: Secondary | ICD-10-CM | POA: Diagnosis not present

## 2020-07-21 DIAGNOSIS — Z86018 Personal history of other benign neoplasm: Secondary | ICD-10-CM | POA: Diagnosis not present

## 2020-07-21 DIAGNOSIS — Z86010 Personal history of colonic polyps: Secondary | ICD-10-CM | POA: Insufficient documentation

## 2020-07-21 DIAGNOSIS — I129 Hypertensive chronic kidney disease with stage 1 through stage 4 chronic kidney disease, or unspecified chronic kidney disease: Secondary | ICD-10-CM | POA: Diagnosis not present

## 2020-07-21 DIAGNOSIS — Z87442 Personal history of urinary calculi: Secondary | ICD-10-CM | POA: Insufficient documentation

## 2020-07-21 DIAGNOSIS — C8213 Follicular lymphoma grade II, intra-abdominal lymph nodes: Secondary | ICD-10-CM

## 2020-07-21 DIAGNOSIS — N183 Chronic kidney disease, stage 3 unspecified: Secondary | ICD-10-CM | POA: Insufficient documentation

## 2020-07-21 DIAGNOSIS — Z803 Family history of malignant neoplasm of breast: Secondary | ICD-10-CM | POA: Diagnosis not present

## 2020-07-21 DIAGNOSIS — Z79899 Other long term (current) drug therapy: Secondary | ICD-10-CM | POA: Insufficient documentation

## 2020-07-21 LAB — CBC WITH DIFFERENTIAL/PLATELET
Abs Immature Granulocytes: 0.01 10*3/uL (ref 0.00–0.07)
Basophils Absolute: 0.1 10*3/uL (ref 0.0–0.1)
Basophils Relative: 1 %
Eosinophils Absolute: 0.1 10*3/uL (ref 0.0–0.5)
Eosinophils Relative: 2 %
HCT: 37.4 % (ref 36.0–46.0)
Hemoglobin: 12.4 g/dL (ref 12.0–15.0)
Immature Granulocytes: 0 %
Lymphocytes Relative: 29 %
Lymphs Abs: 1.8 10*3/uL (ref 0.7–4.0)
MCH: 31.1 pg (ref 26.0–34.0)
MCHC: 33.2 g/dL (ref 30.0–36.0)
MCV: 93.7 fL (ref 80.0–100.0)
Monocytes Absolute: 0.7 10*3/uL (ref 0.1–1.0)
Monocytes Relative: 11 %
Neutro Abs: 3.7 10*3/uL (ref 1.7–7.7)
Neutrophils Relative %: 57 %
Platelets: 208 10*3/uL (ref 150–400)
RBC: 3.99 MIL/uL (ref 3.87–5.11)
RDW: 12.6 % (ref 11.5–15.5)
WBC: 6.4 10*3/uL (ref 4.0–10.5)
nRBC: 0 % (ref 0.0–0.2)

## 2020-07-21 LAB — COMPREHENSIVE METABOLIC PANEL
ALT: 20 U/L (ref 0–44)
AST: 22 U/L (ref 15–41)
Albumin: 4.2 g/dL (ref 3.5–5.0)
Alkaline Phosphatase: 65 U/L (ref 38–126)
Anion gap: 9 (ref 5–15)
BUN: 19 mg/dL (ref 8–23)
CO2: 28 mmol/L (ref 22–32)
Calcium: 9.4 mg/dL (ref 8.9–10.3)
Chloride: 100 mmol/L (ref 98–111)
Creatinine, Ser: 0.98 mg/dL (ref 0.44–1.00)
GFR, Estimated: 59 mL/min — ABNORMAL LOW (ref >60)
Glucose, Bld: 88 mg/dL (ref 70–99)
Potassium: 4.1 mmol/L (ref 3.5–5.1)
Sodium: 137 mmol/L (ref 135–145)
Total Bilirubin: 0.9 mg/dL (ref 0.3–1.2)
Total Protein: 7.8 g/dL (ref 6.5–8.1)

## 2020-07-21 LAB — LACTATE DEHYDROGENASE: LDH: 150 U/L (ref 98–192)

## 2020-07-21 NOTE — Progress Notes (Signed)
Was just dx with A-fib

## 2020-07-21 NOTE — Progress Notes (Signed)
[place] OFFICE PROGRESS NOTE  Patient Care Team: [name], MD as PCP - General (Internal Medicine) [name], MD as PCP - Cardiology (Cardiology) Bary Castilla Forest Gleason, MD (General Surgery)  Cancer Staging No matching staging information was found for the patient.   Oncology History Overview Note   # NOV 5956- FOLLICULAR LYMPHOMA G-2; [s/p Bx- RETROPERITONEAL/LEFT Peri-aortic LN ~ 2.5CM [incidental on CT scan in 2016; CT- Nov 2014- ~1CM]/ PET 2016-Left Peri-aortic LN; Suv ~15; Ext Iliac LN 6 mm; Feb 2017- Unchanged LN; START Rituxan q week x4 [finished march 15th]; PET JAN 30th 2018- NED.   # Hx of Shingles [Sep 2016]-   # Kidney cysts [previous Dr.Wolfe]; Afib on eliquis [Dr.Brian; [facility]]  DIAGNOSIS: Follicle lymphoma-G-2  STAGE: Stage II       ;GOALS: Control  CURRENT/MOST RECENT THERAPY: Surveillance    Follicular lymphoma grade ii, intra-abdominal lymph nodes (HCC)      INTERVAL HISTORY:  Whitney Molina 80 y.o.  female pleasant patient above history of stage II follicular lymphoma-grade 2 is here for follow-up.  In the interim patient was diagnosed with A. fib.  Started on Eliquis.  Denies any blood in stool black or stools.  Denies any abdominal pain nausea vomiting.  No night sweats.  No weight loss.  Review of Systems  Constitutional: Negative for chills, diaphoresis, fever, malaise/fatigue and weight loss.  HENT: Negative for nosebleeds and sore throat.   Eyes: Negative for double vision.  Respiratory: Negative for cough, hemoptysis, sputum production, shortness of breath and wheezing.   Cardiovascular: Negative for chest pain, palpitations, orthopnea and leg swelling.  Gastrointestinal: Negative for abdominal pain, blood in stool, constipation, diarrhea, heartburn, melena, nausea and vomiting.  Genitourinary: Negative for dysuria, frequency and urgency.  Musculoskeletal: Negative for back pain and joint pain.  Skin:  Negative.  Negative for itching and rash.  Neurological: Negative for dizziness, tingling, focal weakness, weakness and headaches.  Endo/Heme/Allergies: Does not bruise/bleed easily.  Psychiatric/Behavioral: Negative for depression. The patient is not nervous/anxious and does not have insomnia.       PAST MEDICAL HISTORY :  Past Medical History:  Diagnosis Date  . Basal cell carcinoma of nose   . Bronchitis   . GERD (gastroesophageal reflux disease)   . Heart murmur   . Hemorrhoids   . History of basal cell carcinoma    right upper lip, right ant shoulder  . History of colon polyps   . History of dysplastic nevus 12/05/2001   left paraspinal upper back, upper back spinal  . History of kidney stones   . History of mammogram 2016  . History of squamous cell carcinoma 02/07/2005   left ant chest  . Hyperlipidemia   . Hypertension   . Non Hodgkin's lymphoma ([place])   . 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 ABDOMINAL HYSTERECTOMY  1984  . BLADDER SUSPENSION  2010  . CHOLECYSTECTOMY  06-01-03  . COLONOSCOPY  2003  . LITHOTRIPSY  2005  . local exicsion skin cancer  2004   squamous cell of chest wall. left chest  . OOPHORECTOMY    . TONSILLECTOMY  1971  . TUBAL LIGATION  1975  . VAGINAL PROLAPSE REPAIR  July 2001   Pelvic Prolapse    FAMILY HISTORY :   Family History  Problem Relation Age of Onset  .  Stroke Mother   . Breast cancer Mother 93  . Stroke Father   . Dementia Father   . Cancer Brother        bladder  . Stroke Brother   . Heart attack Brother   . Breast cancer Paternal Aunt 65  . Bipolar disorder Son     SOCIAL HISTORY:   Social History   Tobacco Use  . Smoking status: Never Smoker  . Smokeless tobacco: Never Used  Vaping Use  . Vaping Use: Never used  Substance Use Topics  . Alcohol use: No  . Drug use: No    ALLERGIES:   has No Known Allergies.  MEDICATIONS:  Current Outpatient Medications  Medication Sig Dispense Refill  . amLODipine (NORVASC) 5 MG tablet 5 mg daily x 1 week in the am. If BP >130/>80 increase to 10 mg norvasc daily after 1 week in the am.  D/c toprol 25 mg xl qd. 90 tablet 3  . apixaban (ELIQUIS) 5 MG TABS tablet Take 1 tablet (5 mg total) by mouth 2 (two) times daily. 60 tablet 5  . Cholecalciferol (VITAMIN D3) 1000 units CAPS Take 1,000 Units by mouth daily.    Marland Kitchen Co-Enzyme Q10 200 MG CAPS Take 1 tablet by mouth. Taking 1 every other day    . docusate sodium (COLACE) 100 MG capsule Take 100 mg by mouth daily.    Marland Kitchen escitalopram (LEXAPRO) 10 MG tablet TAKE 1 TABLET BY MOUTH ONCE DAILY 90 tablet 1  . hyoscyamine (LEVBID) 0.375 MG 12 hr tablet TAKE 1 TABLET BY MOUTH ONCE DAILY 90 tablet 1  . losartan (COZAAR) 100 MG tablet TAKE 1 TABLET BY MOUTH ONCE DAILY 90 tablet 1  . metoprolol tartrate (LOPRESSOR) 25 MG tablet Take 1 tablet (25 mg total) by mouth 2 (two) times daily. 60 tablet 3  . potassium chloride SA (KLOR-CON) 20 MEQ tablet TAKE 1 TABLET BY MOUTH ONCE DAILY 30 tablet 0  . rosuvastatin (CRESTOR) 20 MG tablet TAKE 1 TABLET BY MOUTH AT BEDTIME 30 tablet 2  . VENTOLIN HFA 108 (90 Base) MCG/ACT inhaler INHALE 1 PUFF INTO THE LUNGS EVERY 6 HOURS AS NEEDED WHEEZING OR SHORTNESS OFBREATH. 18 g 2   No current facility-administered medications for this visit.    PHYSICAL EXAMINATION: ECOG PERFORMANCE STATUS: 0 - Asymptomatic  BP (!) 145/73 (BP Location: Left Arm, Patient Position: Sitting, Cuff Size: Normal)   Pulse (!) 56   Temp (!) 96.7 F (35.9 C) (Tympanic)   Resp 16   Ht 5\' 3"  (1.6 m)   Wt 140 lb (63.5 kg)   SpO2 98%   BMI 24.80 kg/m   Filed Weights   07/21/20 1322  Weight: 140 lb (63.5 kg)    Physical Exam Constitutional:      Comments: Accompanied by husband.  Walk independently.   HENT:     Head: Normocephalic and atraumatic.     Mouth/Throat:     Pharynx: No  oropharyngeal exudate.  Eyes:     Pupils: Pupils are equal, round, and reactive to light.  Cardiovascular:     Rate and Rhythm: Normal rate. Rhythm irregular.  Pulmonary:     Effort: Pulmonary effort is normal. No respiratory distress.     Breath sounds: Normal breath sounds. No wheezing.  Abdominal:     General: Bowel sounds are normal. There is no distension.     Palpations: Abdomen is soft. There is no mass.     Tenderness: There is no abdominal tenderness.  There is no guarding or rebound.  Musculoskeletal:        General: No tenderness. Normal range of motion.     Cervical back: Normal range of motion and neck supple.  Skin:    General: Skin is warm.  Neurological:     Mental Status: She is alert and oriented to person, place, and time.  Psychiatric:        Mood and Affect: Affect normal.     LABORATORY DATA:  I have reviewed the data as listed    Component Value Date/Time   NA 137 07/21/2020 1307   NA 138 09/10/2013 0758   K 4.1 07/21/2020 1307   K 3.3 (L) 09/10/2013 0758   CL 100 07/21/2020 1307   CL 100 09/10/2013 0758   CO2 28 07/21/2020 1307   CO2 32 09/10/2013 0758   GLUCOSE 88 07/21/2020 1307   GLUCOSE 85 09/10/2013 0758   BUN 19 07/21/2020 1307   BUN 13 09/10/2013 0758   CREATININE 0.98 07/21/2020 1307   CREATININE 0.96 12/22/2013 0857   CALCIUM 9.4 07/21/2020 1307   CALCIUM 9.2 09/10/2013 0758   PROT 7.8 07/21/2020 1307   PROT 8.3 (H) 09/10/2013 0758   ALBUMIN 4.2 07/21/2020 1307   ALBUMIN 3.5 09/10/2013 0758   AST 22 07/21/2020 1307   AST 23 09/10/2013 0758   ALT 20 07/21/2020 1307   ALT 28 09/10/2013 0758   ALKPHOS 65 07/21/2020 1307   ALKPHOS 78 09/10/2013 0758   BILITOT 0.9 07/21/2020 1307   BILITOT 0.6 09/10/2013 0758   GFRNONAA 59 (L) 07/21/2020 1307   GFRNONAA 59 (L) 12/22/2013 0857   GFRAA >60 01/21/2020 1309   GFRAA 68 12/22/2013 0857    No results found for: SPEP, UPEP  Lab Results  Component Value Date   WBC 6.4 07/21/2020    NEUTROABS 3.7 07/21/2020   HGB 12.4 07/21/2020   HCT 37.4 07/21/2020   MCV 93.7 07/21/2020   PLT 208 07/21/2020      Chemistry      Component Value Date/Time   NA 137 07/21/2020 1307   NA 138 09/10/2013 0758   K 4.1 07/21/2020 1307   K 3.3 (L) 09/10/2013 0758   CL 100 07/21/2020 1307   CL 100 09/10/2013 0758   CO2 28 07/21/2020 1307   CO2 32 09/10/2013 0758   BUN 19 07/21/2020 1307   BUN 13 09/10/2013 0758   CREATININE 0.98 07/21/2020 1307   CREATININE 0.96 12/22/2013 0857      Component Value Date/Time   CALCIUM 9.4 07/21/2020 1307   CALCIUM 9.2 09/10/2013 0758   ALKPHOS 65 07/21/2020 1307   ALKPHOS 78 09/10/2013 0758   AST 22 07/21/2020 1307   AST 23 09/10/2013 0758   ALT 20 07/21/2020 1307   ALT 28 09/10/2013 0758   BILITOT 0.9 07/21/2020 1307   BILITOT 0.6 09/10/2013 0758       RADIOGRAPHIC STUDIES: I have personally reviewed the radiological images as listed and agreed with the findings in the report. No results found.   ASSESSMENT & PLAN:  Follicular lymphoma grade ii, intra-abdominal lymph nodes ([place]) # RETROPERITONEAL LN [incidental/asymptomatic]- follicular lymphoma grade 2; likely stage II; status post rituximab. FEB 2019 PET scan NED; STABLE.  #Clinically no evidence recurrence. will get scans on clinical basis.  # CKD- III- keep log of blood pressures; recommend taking to PCP's next visit.  Also recommend adequate fluid intake.  # A.fib- on eliquis- STABLE.   # DISPOSITION: # follow  up in 6 months-MD/ labs-cbc/cmp,ldh-Dr.B  Cc: Dr.Tullo   Orders Placed This Encounter  Procedures  . CBC with Differential    Standing Status:   Future    Standing Expiration Date:   07/21/2021  . Comprehensive metabolic panel    Standing Status:   Future    Standing Expiration Date:   07/21/2021  . Lactate dehydrogenase    Standing Status:   Future    Standing Expiration Date:   07/21/2021   All questions were answered. The patient knows to call the clinic with  any problems, questions or concerns.      Cammie Sickle, MD 07/21/2020 7:50 PM

## 2020-07-21 NOTE — Assessment & Plan Note (Addendum)
#   RETROPERITONEAL LN [incidental/asymptomatic]- follicular lymphoma grade 2; likely stage II; status post rituximab. FEB 2019 PET scan NED; stable  #Clinically no evidence recurrence. will get scans on clinical basis.  # CKD- III-GFR 58-stable.  # A.fib- on eliquis- STABLE.   # DISPOSITION: # follow up in 6 months-MD/ labs-cbc/cmp,ldh-Dr.B  Cc: Dr.Tullo

## 2020-08-02 ENCOUNTER — Ambulatory Visit (INDEPENDENT_AMBULATORY_CARE_PROVIDER_SITE_OTHER): Payer: Medicare Other

## 2020-08-02 ENCOUNTER — Other Ambulatory Visit: Payer: Self-pay

## 2020-08-02 DIAGNOSIS — I4819 Other persistent atrial fibrillation: Secondary | ICD-10-CM

## 2020-08-02 LAB — ECHOCARDIOGRAM COMPLETE
AV Vena cont: 0.3 cm
Area-P 1/2: 2.99 cm2
Calc EF: 70.1 %
MV M vel: 4.26 m/s
MV Peak grad: 72.6 mmHg
P 1/2 time: 609 msec
Radius: 0.6 cm
S' Lateral: 2.7 cm
Single Plane A2C EF: 63.2 %
Single Plane A4C EF: 76 %

## 2020-08-06 ENCOUNTER — Other Ambulatory Visit: Payer: Self-pay

## 2020-08-06 ENCOUNTER — Ambulatory Visit: Payer: Medicare Other | Admitting: Cardiology

## 2020-08-06 ENCOUNTER — Encounter: Payer: Self-pay | Admitting: Cardiology

## 2020-08-06 VITALS — BP 134/70 | Ht 63.5 in | Wt 140.0 lb

## 2020-08-06 DIAGNOSIS — I10 Essential (primary) hypertension: Secondary | ICD-10-CM

## 2020-08-06 DIAGNOSIS — E7800 Pure hypercholesterolemia, unspecified: Secondary | ICD-10-CM

## 2020-08-06 DIAGNOSIS — I480 Paroxysmal atrial fibrillation: Secondary | ICD-10-CM | POA: Diagnosis not present

## 2020-08-06 NOTE — Patient Instructions (Signed)

## 2020-08-06 NOTE — Progress Notes (Addendum)
Cardiology Office Note:    Date:  08/06/2020   ID:  Whitney Molina, DOB 17-Jun-1940, MRN [id number]  PCP:  [name], MD   [place]  Cardiologist:  [name], MD  Advanced Practice Provider:  No care team member to display Electrophysiologist:  None       Referring MD: [name], MD   Chief Complaint  Patient presents with  . Other    Follow up post ECHO - Patient c.o new swelling in ankles. Meds reviewed verbally with patient.     History of Present Illness:    Whitney Molina is a 80 y.o. female with a hx of hypertension, hyperlipidemia, atrial fibrillation who presents for follow-up.  Patient last seen due to atrial fibrillation and hypertension.    She was started on Lopressor and Eliquis.  Echocardiogram ordered to evaluate systolic and diastolic function.  States having some new swelling in her ankles.  Recently started on Norvasc for blood pressure.  She is states her blood pressures have been better controlled, has occasional swelling in her anklesafter she has been on her feet for long.  Denies chest pain, denies palpitations, denies dizziness.  Feels well, has appointment to see urology due to her renal cyst and also kidney stones.  Tolerating Eliquis with no bleeding effects.     Past Medical History:  Diagnosis Date  . Basal cell carcinoma of nose   . Bronchitis   . GERD (gastroesophageal reflux disease)   . Heart murmur   . Hemorrhoids   . History of basal cell carcinoma    right upper lip, right ant shoulder  . History of colon polyps   . History of dysplastic nevus 12/05/2001   left paraspinal upper back, upper back spinal  . History of kidney stones   . History of mammogram 2016  . History of squamous cell carcinoma 02/07/2005   left ant chest  . Hyperlipidemia   . Hypertension   . Non Hodgkin's lymphoma ([place])   . 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 ABDOMINAL HYSTERECTOMY  1984  . BLADDER SUSPENSION  2010  . CHOLECYSTECTOMY  06-01-03  . COLONOSCOPY  2003  . LITHOTRIPSY  2005  . local exicsion skin cancer  2004   squamous cell of chest wall. left chest  . OOPHORECTOMY    . TONSILLECTOMY  1971  . TUBAL LIGATION  1975  . VAGINAL PROLAPSE REPAIR  July 2001   Pelvic Prolapse    Current Medications: Current Meds  Medication Sig  . amLODipine (NORVASC) 5 MG tablet 5 mg daily x 1 week in the am. If BP >130/>80 increase to 10 mg norvasc daily after 1 week in the am.  D/c toprol 25 mg xl qd.  . apixaban (ELIQUIS) 5 MG TABS tablet Take 1 tablet (5 mg total) by mouth 2 (two) times daily.  . Cholecalciferol (VITAMIN D3) 1000 units CAPS Take 1,000 Units by mouth daily.  Marland Kitchen Co-Enzyme Q10 200 MG CAPS Take 1 tablet by mouth. Taking 1 every other day  . docusate sodium (COLACE) 100 MG capsule Take 100 mg by mouth daily.  Marland Kitchen escitalopram (LEXAPRO) 10 MG tablet TAKE 1 TABLET BY MOUTH ONCE DAILY  . hyoscyamine (LEVBID) 0.375 MG 12 hr tablet TAKE 1 TABLET BY MOUTH ONCE DAILY  . losartan (COZAAR) 100 MG tablet TAKE 1 TABLET  BY MOUTH ONCE DAILY  . metoprolol tartrate (LOPRESSOR) 25 MG tablet Take 1 tablet (25 mg total) by mouth 2 (two) times daily.  . potassium chloride SA (KLOR-CON) 20 MEQ tablet TAKE 1 TABLET BY MOUTH ONCE DAILY  . rosuvastatin (CRESTOR) 20 MG tablet TAKE 1 TABLET BY MOUTH AT BEDTIME  . VENTOLIN HFA 108 (90 Base) MCG/ACT inhaler INHALE 1 PUFF INTO THE LUNGS EVERY 6 HOURS AS NEEDED WHEEZING OR SHORTNESS OFBREATH.     Allergies:   Patient has no known allergies.   Social History   Socioeconomic History  . Marital status: Married    Spouse name: Not on file  . Number of children: Not on file  . Years of education: Not on file  . Highest education level: Not on file  Occupational History  . Not on file  Tobacco Use  . Smoking status: Never Smoker  . Smokeless tobacco:  Never Used  Vaping Use  . Vaping Use: Never used  Substance and Sexual Activity  . Alcohol use: No  . Drug use: No  . Sexual activity: Yes  Other Topics Concern  . Not on file  Social History Narrative  . Not on file   Social Determinants of Health   Financial Resource Strain: Not on file  Food Insecurity: Not on file  Transportation Needs: Not on file  Physical Activity: Not on file  Stress: Not on file  Social Connections: Not on file     Family History: The patient's family history includes Bipolar disorder in her son; Breast cancer (age of onset: 49) in her paternal aunt; Breast cancer (age of onset: 22) in her mother; Cancer in her brother; Dementia in her father; Heart attack in her brother; Stroke in her brother, father, and mother.  ROS:   Please see the history of present illness.     All other systems reviewed and are negative.  EKGs/Labs/Other Studies Reviewed:    The following studies were reviewed today:   EKG:  EKG is  ordered today.  The ekg ordered today demonstrates sinus bradycardia, heart rate 57  Recent Labs: 04/20/2020: TSH 1.19 07/21/2020: ALT 20; BUN 19; Creatinine, Ser 0.98; Hemoglobin 12.4; Platelets 208; Potassium 4.1; Sodium 137  Recent Lipid Panel    Component Value Date/Time   CHOL 106 04/20/2020 0953   TRIG 151.0 (H) 04/20/2020 0953   HDL 31.40 (L) 04/20/2020 0953   CHOLHDL 3 04/20/2020 0953   VLDL 30.2 04/20/2020 0953   LDLCALC 45 04/20/2020 0953   LDLDIRECT 145.0 04/01/2018 1116     Risk Assessment/Calculations:      Physical Exam:    VS:  BP 134/70 (BP Location: Left Arm, Patient Position: Sitting, Cuff Size: Normal)   Ht 5' 3.5" (1.613 m)   Wt 140 lb (63.5 kg)   BMI 24.41 kg/m     Wt Readings from Last 3 Encounters:  08/06/20 140 lb (63.5 kg)  07/21/20 140 lb (63.5 kg)  07/09/20 141 lb (64 kg)     GEN:  Well nourished, well developed in no acute distress HEENT: Normal NECK: No JVD; No carotid bruits LYMPHATICS: No  lymphadenopathy CARDIAC: Regular rate and rhythm, no murmurs RESPIRATORY:  Clear to auscultation without rales, wheezing or rhonchi  ABDOMEN: Soft, non-tender, non-distended MUSCULOSKELETAL:  No edema; No deformity  SKIN: Warm and dry NEUROLOGIC:  Alert and oriented x 3 PSYCHIATRIC:  Normal affect   ASSESSMENT:    1. Paroxysmal atrial fibrillation (HCC)   2. Primary hypertension  3. Pure hypercholesterolemia    PLAN:    In order of problems listed above:  1. Paroxysmal atrial fibrillation, currently in sinus rhythm.  CHA2DS2-VASc score 4 (age, htn, gender).  Continue Lopressor 25 mg twice daily, Eliquis 5 mg twice daily.  Echocardiogram showed normal systolic function, EF 60 to 53%, grade 2 diastolic dysfunction, normal LA size. 2. Hypertension, BP controlled..  Continue Lopressor, amlodipine, losartan. 3. Hyperlipidemia, continue statin.   Follow-up in 6 months   Medication Adjustments/Labs and Tests Ordered: Current medicines are reviewed at length with the patient today.  Concerns regarding medicines are outlined above.  Orders Placed This Encounter  Procedures  . EKG 12-Lead   No orders of the defined types were placed in this encounter.   Patient Instructions  Medication Instructions:  Your physician recommends that you continue on your current medications as directed. Please refer to the Current Medication list given to you today.  *If you need a refill on your cardiac medications before your next appointment, please call your pharmacy*   Lab Work: None ordered If you have labs (blood work) drawn today and your tests are completely normal, you will receive your results only by: Marland Kitchen MyChart Message (if you have MyChart) OR . A paper copy in the mail If you have any lab test that is abnormal or we need to change your treatment, we will call you to review the results.   Testing/Procedures: None ordered   Follow-Up: At [facility], you and your health needs  are our priority.  As part of our continuing mission to provide you with exceptional heart care, we have created designated Provider Care Teams.  These Care Teams include your primary Cardiologist (physician) and Advanced Practice Providers (APPs -  Physician Assistants and Nurse Practitioners) who all work together to provide you with the care you need, when you need it.  We recommend signing up for the patient portal called "MyChart".  Sign up information is provided on this After Visit Summary.  MyChart is used to connect with patients for Virtual Visits (Telemedicine).  Patients are able to view lab/test results, encounter notes, upcoming appointments, etc.  Non-urgent messages can be sent to your provider as well.   To learn more about what you can do with MyChart, go to [email or web address].    Your next appointment:   6 month(s)  The format for your next appointment:   In Person  Provider:   [name], MD   Other Instructions      Signed, [name], MD  08/06/2020 12:42 PM    [place]

## 2020-08-10 ENCOUNTER — Telehealth: Payer: Self-pay | Admitting: Cardiology

## 2020-08-10 NOTE — Telephone Encounter (Signed)
Called patient back and LMOM to call back.

## 2020-08-10 NOTE — Telephone Encounter (Signed)
Pt c/o medication issue:  1. Name of Medication: norvasc/amlodipine   2. How are you currently taking this medication (dosage and times per day)? 5 mg bid  3. Are you having a reaction (difficulty breathing--STAT)?   4. What is your medication issue? Swelling in feet and legs, head hurt and just "felt bad". Patient stopped taking medication 08/10/19 and feels much better with no swelling

## 2020-08-26 ENCOUNTER — Other Ambulatory Visit: Payer: Self-pay | Admitting: Internal Medicine

## 2020-08-26 ENCOUNTER — Other Ambulatory Visit: Payer: Self-pay

## 2020-08-26 ENCOUNTER — Ambulatory Visit (INDEPENDENT_AMBULATORY_CARE_PROVIDER_SITE_OTHER): Payer: Medicare Other | Admitting: Internal Medicine

## 2020-08-26 ENCOUNTER — Encounter: Payer: Self-pay | Admitting: Internal Medicine

## 2020-08-26 VITALS — BP 130/68 | HR 53 | Temp 98.0°F | Resp 15 | Ht 63.5 in | Wt 140.6 lb

## 2020-08-26 DIAGNOSIS — R7303 Prediabetes: Secondary | ICD-10-CM

## 2020-08-26 DIAGNOSIS — Z1231 Encounter for screening mammogram for malignant neoplasm of breast: Secondary | ICD-10-CM

## 2020-08-26 DIAGNOSIS — Z0001 Encounter for general adult medical examination with abnormal findings: Secondary | ICD-10-CM

## 2020-08-26 DIAGNOSIS — Z0000 Encounter for general adult medical examination without abnormal findings: Secondary | ICD-10-CM

## 2020-08-26 DIAGNOSIS — I129 Hypertensive chronic kidney disease with stage 1 through stage 4 chronic kidney disease, or unspecified chronic kidney disease: Secondary | ICD-10-CM

## 2020-08-26 DIAGNOSIS — I480 Paroxysmal atrial fibrillation: Secondary | ICD-10-CM

## 2020-08-26 DIAGNOSIS — M810 Age-related osteoporosis without current pathological fracture: Secondary | ICD-10-CM

## 2020-08-26 DIAGNOSIS — D126 Benign neoplasm of colon, unspecified: Secondary | ICD-10-CM

## 2020-08-26 MED ORDER — FUROSEMIDE 20 MG PO TABS: 20.0000 mg | ORAL_TABLET | Freq: Every day | ORAL | 3 refills | Status: DC | PRN

## 2020-08-26 MED ORDER — ZOSTER VAC RECOMB ADJUVANTED 50 MCG/0.5ML IM SUSR: 0.5000 mL | Freq: Once | INTRAMUSCULAR | 1 refills | Status: AC

## 2020-08-26 NOTE — Patient Instructions (Addendum)
Reduce amlodipine  To once daily 5 mg dose in the evening   Use furosemide when needed   For fluid retention   Ok to add  1/2 metoprolol tablet as needed for HR > 100    We should check potassium level if you end up using furosemide more than 2 times per week    IF BP is not 140/80 on lower dose of amlodipine ,  Let me know

## 2020-08-26 NOTE — Progress Notes (Signed)
Patient ID: Whitney Molina, female    DOB: [birth year]  Age: 80 y.o. MRN: [id number]  The patient is here for annual preventive  examination and management of other chronic and acute problems.  This visit occurred during the SARS-CoV-2 public health emergency.  Safety protocols were in place, including screening questions prior to the visit, additional usage of staff PPE, and extensive cleaning of exam room while observing appropriate contact time as indicated for disinfecting solutions.      The risk factors are reflected in the social history.  The roster of all physicians providing medical care to patient - is listed in the Snapshot section of the chart.  Activities of daily living:  The patient is 100% independent in all ADLs: dressing, toileting, feeding as well as independent mobility  Home safety : The patient has smoke detectors in the home. They wear seatbelts.  There are no firearms at home. There is no violence in the home.   There is no risks for hepatitis, STDs or HIV. There is no   history of blood transfusion. They have no travel history to infectious disease endemic areas of the world.  The patient has seen their dentist in the last six month. They have seen their eye doctor in the last year. They admit to slight hearing difficulty with regard to whispered voices and some television programs.  They have deferred audiologic testing in the last year.  They do not  have excessive sun exposure. Discussed the need for sun protection: hats, long sleeves and use of sunscreen if there is significant sun exposure.   Diet: the importance of a healthy diet is discussed. They do have a healthy diet.  The benefits of regular aerobic exercise were discussed. She walks 4 times per week ,  20 minutes.   Depression screen: there are no signs or vegative symptoms of depression- irritability, change in appetite, anhedonia, sadness/tearfullness.  Cognitive assessment: the patient manages  all their financial and personal affairs and is actively engaged. They could relate day,date,year and events; recalled 2/3 objects at 3 minutes; performed clock-face test normally.  The following portions of the patient's history were reviewed and updated as appropriate: allergies, current medications, past family history, past medical history,  past surgical history, past social history  and problem list.  Visual acuity was not assessed per patient preference since she has regular follow up with her ophthalmologist. Hearing and body mass index were assessed and reviewed.   During the course of the visit the patient was educated and counseled about appropriate screening and preventive services including : fall prevention , diabetes screening, nutrition counseling, colorectal cancer screening, and recommended immunizations.    CC: The primary encounter diagnosis was Encounter for preventive health examination. Diagnoses of Breast cancer screening by mammogram, Renal hypertension, Prediabetes, Age-related osteoporosis without current pathological fracture, Tubular adenoma of colon, and Paroxysmal atrial fibrillation ([place]) were also pertinent to this visit.  1) HTN:  Amlodipine added by TMSin early march .  RAS suspected, doppler ordered and scheduled for April 21 at [facility] .  Having new onset ankle edema that becomes quite significant by end of day. She stopped amlodipine for a week bc of LE  swelling resolved. During that period her BP at home was unchanged (range 130/80 to 140/80) but she had an episode of irregular heartbeat accompanied by"weak and dizzy" . The episode occurred in the evening while at rest but not sleeping.  Confirmed that she had not mistakenly suspended her metoprolol  and she is confident that she can tell the 2 medications apart.  Taking metoprolol 25 mg bid. ECHO reviewed, EF normal   2)PAF: tolerating eliquis and metoprolol.  Reassured her that the Eliquis was necessary.  3)  Hyperlipidemia: tolerating crestor without myalgias.    4) H/o lymphoma: s/p treatment with Rituxan in 2019.  Sees oncology q 6 months.  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   She has a past surgical history that includes Tonsillectomy (1971); Tubal ligation (1975); Abdominal hysterectomy (1984); Vaginal prolapse repair (July 2001); Cholecystectomy (06-01-03); Lithotripsy (2005); Bladder suspension (2010); local exicsion skin cancer (2004); Colonoscopy (2003); and Oophorectomy.   Her family history includes Bipolar disorder in her son; Breast cancer (age of onset: 55) in her paternal aunt; Breast cancer (age of onset: 63) in her mother; Cancer in her brother; Dementia in her father; Heart attack in her brother; Stroke in her brother, father, and mother.She reports that she has never smoked. She has never used smokeless tobacco. She reports that she does not drink alcohol and does not use drugs.  Outpatient Medications Prior to Visit  Medication Sig Dispense Refill  . amLODipine (NORVASC) 5 MG tablet 5 mg daily x 1 week in the am. If BP >130/>80 increase to 10 mg norvasc daily after 1 week in the am.  D/c toprol 25 mg xl qd. 90 tablet 3  . apixaban (ELIQUIS) 5 MG TABS tablet Take 1 tablet (5 mg total) by mouth 2 (two) times daily. 60 tablet 5  . Cholecalciferol (VITAMIN D3) 1000 units CAPS Take 1,000 Units by mouth daily.    Marland Kitchen Co-Enzyme Q10 200 MG CAPS Take 1 tablet by mouth. Taking 1 every other day    . escitalopram (LEXAPRO) 10 MG  tablet TAKE 1 TABLET BY MOUTH ONCE DAILY 90 tablet 1  . hyoscyamine (LEVBID) 0.375 MG 12 hr tablet TAKE 1 TABLET BY MOUTH ONCE DAILY 90 tablet 1  . losartan (COZAAR) 100 MG tablet TAKE 1 TABLET BY MOUTH ONCE DAILY 90 tablet 1  . metoprolol tartrate (LOPRESSOR) 25 MG tablet Take 1 tablet (25 mg total) by mouth 2 (two) times daily. 60 tablet 3  . psyllium (METAMUCIL) 58.6 % packet Take 1 packet by mouth daily.    . VENTOLIN HFA 108 (90 Base) MCG/ACT inhaler INHALE 1 PUFF INTO THE LUNGS EVERY 6 HOURS AS NEEDED WHEEZING OR SHORTNESS OFBREATH. 18 g 2  . potassium chloride SA (KLOR-CON) 20 MEQ tablet TAKE 1 TABLET BY MOUTH ONCE DAILY 30 tablet 0  . rosuvastatin (CRESTOR) 20 MG tablet TAKE 1 TABLET BY MOUTH AT BEDTIME 30 tablet 2  . docusate sodium (COLACE) 100 MG capsule Take 100 mg by mouth daily. (Patient not taking: Reported on 08/26/2020)     No facility-administered medications prior to visit.    Review of Systems  Patient denies headache, fevers, malaise, unintentional weight loss, skin rash, eye pain, sinus congestion and sinus pain, sore throat, dysphagia,  hemoptysis , cough, dyspnea, wheezing, chest pain, palpitations, orthopnea, edema, abdominal pain, nausea, melena, diarrhea, constipation, flank pain, dysuria, hematuria, urinary  Frequency, nocturia, numbness, tingling, seizures,  Focal weakness, Loss of consciousness,  Tremor, insomnia, depression, anxiety, and suicidal ideation.     Objective:  BP 130/68 (BP Location: Left Arm, Patient Position: Sitting, Cuff Size: Normal)   Pulse (!) 53   Temp 98 F (36.7 C) (Oral)   Resp 15   Ht 5' 3.5" (1.613 m)   Wt 140 lb 9.6 oz (63.8 kg)   SpO2 97%   BMI 24.52 kg/m   Physical Exam  General appearance: alert, cooperative and appears stated age Head: Normocephalic, without obvious abnormality, atraumatic Eyes: conjunctivae/corneas clear. PERRL, EOM's intact. Fundi benign. Ears: normal TM's and external ear canals both ears Nose: Nares  normal. Septum midline. Mucosa normal. No drainage or sinus tenderness. Throat: lips, mucosa, and tongue normal; teeth and gums normal Neck: no adenopathy, no carotid bruit, no JVD, supple, symmetrical, trachea midline and thyroid not enlarged, symmetric, no tenderness/mass/nodules Lungs: clear to auscultation bilaterally Breasts: normal appearance, no masses or tenderness Heart: regular rate and rhythm, S1, S2 normal, no murmur, click, rub or gallop Abdomen: soft, non-tender; bowel sounds normal; no masses,  no organomegaly Extremities: extremities normal, atraumatic, no cyanosis or edema Pulses: 2+ and symmetric Skin: Skin color, texture, turgor normal. No rashes or lesions Neurologic: Alert and oriented X 3, normal strength and tone. Normal symmetric reflexes. Normal coordination and gait.    Assessment & Plan:   Problem List Items Addressed This Visit      Unprioritized   Tubular adenoma of colon    Found during diagnostic colonoscopy Jan 2020  after Colguard test was positive in Dec 2019.  3 yr follow up colonoscopy was advised (Jan 2023)      Renal hypertension    Renal artery doppler has been scheduled.  BP is elevated without amlodipine , which she was taking as 5 mg bid.  Advised to resume amlodipine at 5 mg daily  Use furosemdie prn edema (not more than 3/week)  And recheck potassium if using regularly      Relevant Medications   furosemide (LASIX) 20 MG tablet   Prediabetes    Her  random serum glucose readings are not   elevated but her A1c suggests she is at risk for developing diabetes.  I recommend she follow a low glycemic index diet and particpate regularly in an aerobic  exercise activity.  We should check an A1c in 6 months.   Lab Results  Component Value Date   HGBA1C 6.3 04/20/2020         Paroxysmal atrial fibrillation (HCC)    Currently in sinus rhythm with recent episode reported suggestive of a run. Continue Lopressor 25 mg twice daily, Eliquis 5 mg  twice daily.        Relevant Medications   furosemide (LASIX) 20 MG tablet   Osteoporosis    T scores -2.4 Sept 2015.  No history of fractures.  No historyof prior treatment. She has deferred use of medication due to other health conditions.  Repeat DEXA needed now that lymphoma is in remission       Relevant Orders   DG Bone Density   Encounter for preventive health examination - Primary    age appropriate education and counseling updated, referrals for preventative services and immunizations addressed, dietary and smoking counseling addressed, most recent labs reviewed.  I have personally reviewed and have noted:  1) the patient's medical and social history 2) The pt's use of alcohol, tobacco, and illicit drugs 3) The patient's current medications and supplements 4) Functional ability  including ADL's, fall risk, home safety risk, hearing and visual impairment 5) Diet and physical activities 6) Evidence for depression or mood disorder 7) The patient's height, weight, and BMI have been recorded in the chart  I have made referrals, and provided counseling and education based on review of the above       Other Visit Diagnoses    Breast cancer screening by mammogram       Relevant Orders   MM 3D SCREEN BREAST BILATERAL      I have discontinued Shadae A. Dinan's docusate sodium. I am also having her start on furosemide and Zoster Vaccine Adjuvanted. Additionally, I am having her maintain her Vitamin D3, Co-Enzyme Q10, hyoscyamine, Ventolin HFA, escitalopram, losartan, amLODipine, metoprolol tartrate, apixaban, and psyllium.  Meds ordered this encounter  Medications  . furosemide (LASIX) 20 MG tablet    Sig: Take 1 tablet (20 mg total) by mouth daily as needed. Fluid retention    Dispense:  30 tablet    Refill:  3  . Zoster Vaccine Adjuvanted [facility]) injection    Sig: Inject 0.5 mLs into the muscle once for 1 dose.    Dispense:  1 each    Refill:  1     Medications Discontinued During This Encounter  Medication Reason  . docusate sodium (COLACE) 100 MG capsule Patient Preference    Follow-up: No follow-ups on file.   [name], MD

## 2020-08-29 NOTE — Assessment & Plan Note (Addendum)
Found during diagnostic colonoscopy Jan 2020  after Colguard test was positive in Dec 2019.  3 yr follow up colonoscopy was advised (Jan 2023)

## 2020-08-29 NOTE — Assessment & Plan Note (Signed)
Renal artery doppler has been scheduled.  BP is elevated without amlodipine , which she was taking as 5 mg bid.  Advised to resume amlodipine at 5 mg daily  Use furosemdie prn edema (not more than 3/week)  And recheck potassium if using regularly

## 2020-08-29 NOTE — Assessment & Plan Note (Signed)

## 2020-08-29 NOTE — Assessment & Plan Note (Signed)
Her  random serum glucose readings are not   elevated but her A1c suggests she is at risk for developing diabetes.  I recommend she follow a low glycemic index diet and particpate regularly in an aerobic  exercise activity.  We should check an A1c in 6 months.   Lab Results  Component Value Date   HGBA1C 6.3 04/20/2020

## 2020-08-29 NOTE — Assessment & Plan Note (Signed)
T scores -2.4 Sept 2015.  No history of fractures.  No historyof prior treatment. She has deferred use of medication due to other health conditions.  Repeat DEXA needed now that lymphoma is in remission

## 2020-08-29 NOTE — Assessment & Plan Note (Signed)
Currently in sinus rhythm with recent episode reported suggestive of a run. Continue Lopressor 25 mg twice daily, Eliquis 5 mg twice daily.

## 2020-09-02 ENCOUNTER — Ambulatory Visit
Admission: RE | Admit: 2020-09-02 | Discharge: 2020-09-02 | Disposition: A | Discharge status: Home / Self Care | Payer: Medicare Other | Source: Ambulatory Visit | Attending: Internal Medicine | Admitting: Internal Medicine

## 2020-09-02 ENCOUNTER — Other Ambulatory Visit: Payer: Self-pay

## 2020-09-02 DIAGNOSIS — I129 Hypertensive chronic kidney disease with stage 1 through stage 4 chronic kidney disease, or unspecified chronic kidney disease: Secondary | ICD-10-CM | POA: Diagnosis not present

## 2020-09-02 DIAGNOSIS — I10 Essential (primary) hypertension: Secondary | ICD-10-CM

## 2020-09-02 DIAGNOSIS — I701 Atherosclerosis of renal artery: Secondary | ICD-10-CM | POA: Insufficient documentation

## 2020-09-02 DIAGNOSIS — Z0389 Encounter for observation for other suspected diseases and conditions ruled out: Secondary | ICD-10-CM | POA: Diagnosis not present

## 2020-09-02 DIAGNOSIS — N281 Cyst of kidney, acquired: Secondary | ICD-10-CM | POA: Diagnosis not present

## 2020-09-15 ENCOUNTER — Other Ambulatory Visit: Payer: Self-pay

## 2020-09-15 ENCOUNTER — Ambulatory Visit (INDEPENDENT_AMBULATORY_CARE_PROVIDER_SITE_OTHER): Payer: Medicare Other

## 2020-09-15 VITALS — BP 128/71 | Ht 63.5 in | Wt 140.0 lb

## 2020-09-15 DIAGNOSIS — Z0000 Encounter for general adult medical examination without abnormal findings: Secondary | ICD-10-CM

## 2020-09-15 NOTE — Patient Instructions (Addendum)
Ms. Whitney Molina , Thank you for taking time to come for your Medicare Wellness Visit. I appreciate your ongoing commitment to your health goals. Please review the following plan we discussed and let me know if I can assist you in the future.   These are the goals we discussed: Goals      Patient Stated   .  Maintain Weight (pt-stated)      Stay hydrated Stay active       This is a list of the screening recommended for you and due dates:  Health Maintenance  Topic Date Due  . DEXA scan (bone density measurement)  03/26/2020  . Mammogram  08/26/2020  . Flu Shot  12/13/2020  . COVID-19 Vaccine (4 - Booster for [place] series) 12/14/2020  . Colon Cancer Screening  06/11/2021  . Tetanus Vaccine  11/22/2022  .  Hepatitis C: One time screening is recommended by Center for Disease Control  (CDC) for  adults born from [birth year] through 1964.   Completed  . Pneumonia vaccines  Completed  . HPV Vaccine  Aged Out   Advanced directives: not yet completed  Conditions/risks identified: none new  Follow up in one year for your annual wellness visit    Preventive Care 65 Years and Older, Female Preventive care refers to lifestyle choices and visits with your health care provider that can promote health and wellness. What does preventive care include?  A yearly physical exam. This is also called an annual well check.  Dental exams once or twice a year.  Routine eye exams. Ask your health care provider how often you should have your eyes checked.  Personal lifestyle choices, including:  Daily care of your teeth and gums.  Regular physical activity.  Eating a healthy diet.  Avoiding tobacco and drug use.  Limiting alcohol use.  Practicing safe sex.  Taking low-dose aspirin every day.  Taking vitamin and mineral supplements as recommended by your health care provider. What happens during an annual well check? The services and screenings done by your health care provider during  your annual well check will depend on your age, overall health, lifestyle risk factors, and family history of disease. Counseling  Your health care provider may ask you questions about your:  Alcohol use.  Tobacco use.  Drug use.  Emotional well-being.  Home and relationship well-being.  Sexual activity.  Eating habits.  History of falls.  Memory and ability to understand (cognition).  Work and work Statistician.  Reproductive health. Screening  You may have the following tests or measurements:  Height, weight, and BMI.  Blood pressure.  Lipid and cholesterol levels. These may be checked every 5 years, or more frequently if you are over 26 years old.  Skin check.  Lung cancer screening. You may have this screening every year starting at age 17 if you have a 30-pack-year history of smoking and currently smoke or have quit within the past 15 years.  Fecal occult blood test (FOBT) of the stool. You may have this test every year starting at age 39.  Flexible sigmoidoscopy or colonoscopy. You may have a sigmoidoscopy every 5 years or a colonoscopy every 10 years starting at age 7.  Hepatitis C blood test.  Hepatitis B blood test.  Sexually transmitted disease (STD) testing.  Diabetes screening. This is done by checking your blood sugar (glucose) after you have not eaten for a while (fasting). You may have this done every 1-3 years.  Bone density scan. This is done  to screen for osteoporosis. You may have this done starting at age 32.  Mammogram. This may be done every 1-2 years. Talk to your health care provider about how often you should have regular mammograms. Talk with your health care provider about your test results, treatment options, and if necessary, the need for more tests. Vaccines  Your health care provider may recommend certain vaccines, such as:  Influenza vaccine. This is recommended every year.  Tetanus, diphtheria, and acellular pertussis (Tdap,  Td) vaccine. You may need a Td booster every 10 years.  Zoster vaccine. You may need this after age 52.  Pneumococcal 13-valent conjugate (PCV13) vaccine. One dose is recommended after age 22.  Pneumococcal polysaccharide (PPSV23) vaccine. One dose is recommended after age 15. Talk to your health care provider about which screenings and vaccines you need and how often you need them. This information is not intended to replace advice given to you by your health care provider. Make sure you discuss any questions you have with your health care provider. Document Released: 05/28/2015 Document Revised: 01/19/2016 Document Reviewed: 03/02/2015 Elsevier Interactive Patient Education  2017 [place] Prevention in the Home Falls can cause injuries. They can happen to people of all ages. There are many things you can do to make your home safe and to help prevent falls. What can I do on the outside of my home?  Regularly fix the edges of walkways and driveways and fix any cracks.  Remove anything that might make you trip as you walk through a door, such as a raised step or threshold.  Trim any bushes or trees on the path to your home.  Use bright outdoor lighting.  Clear any walking paths of anything that might make someone trip, such as rocks or tools.  Regularly check to see if handrails are loose or broken. Make sure that both sides of any steps have handrails.  Any raised decks and porches should have guardrails on the edges.  Have any leaves, snow, or ice cleared regularly.  Use sand or salt on walking paths during winter.  Clean up any spills in your garage right away. This includes oil or grease spills. What can I do in the bathroom?  Use night lights.  Install grab bars by the toilet and in the tub and shower. Do not use towel bars as grab bars.  Use non-skid mats or decals in the tub or shower.  If you need to sit down in the shower, use a plastic, non-slip  stool.  Keep the floor dry. Clean up any water that spills on the floor as soon as it happens.  Remove soap buildup in the tub or shower regularly.  Attach bath mats securely with double-sided non-slip rug tape.  Do not have throw rugs and other things on the floor that can make you trip. What can I do in the bedroom?  Use night lights.  Make sure that you have a light by your bed that is easy to reach.  Do not use any sheets or blankets that are too big for your bed. They should not hang down onto the floor.  Have a firm chair that has side arms. You can use this for support while you get dressed.  Do not have throw rugs and other things on the floor that can make you trip. What can I do in the kitchen?  Clean up any spills right away.  Avoid walking on wet floors.  Keep items  that you use a lot in easy-to-reach places.  If you need to reach something above you, use a strong step stool that has a grab bar.  Keep electrical cords out of the way.  Do not use floor polish or wax that makes floors slippery. If you must use wax, use non-skid floor wax.  Do not have throw rugs and other things on the floor that can make you trip. What can I do with my stairs?  Do not leave any items on the stairs.  Make sure that there are handrails on both sides of the stairs and use them. Fix handrails that are broken or loose. Make sure that handrails are as long as the stairways.  Check any carpeting to make sure that it is firmly attached to the stairs. Fix any carpet that is loose or worn.  Avoid having throw rugs at the top or bottom of the stairs. If you do have throw rugs, attach them to the floor with carpet tape.  Make sure that you have a light switch at the top of the stairs and the bottom of the stairs. If you do not have them, ask someone to add them for you. What else can I do to help prevent falls?  Wear shoes that:  Do not have high heels.  Have rubber bottoms.  Are  comfortable and fit you well.  Are closed at the toe. Do not wear sandals.  If you use a stepladder:  Make sure that it is fully opened. Do not climb a closed stepladder.  Make sure that both sides of the stepladder are locked into place.  Ask someone to hold it for you, if possible.  Clearly mark and make sure that you can see:  Any grab bars or handrails.  First and last steps.  Where the edge of each step is.  Use tools that help you move around (mobility aids) if they are needed. These include:  Canes.  Walkers.  Scooters.  Crutches.  Turn on the lights when you go into a dark area. Replace any light bulbs as soon as they burn out.  Set up your furniture so you have a clear path. Avoid moving your furniture around.  If any of your floors are uneven, fix them.  If there are any pets around you, be aware of where they are.  Review your medicines with your doctor. Some medicines can make you feel dizzy. This can increase your chance of falling. Ask your doctor what other things that you can do to help prevent falls. This information is not intended to replace advice given to you by your health care provider. Make sure you discuss any questions you have with your health care provider. Document Released: 02/25/2009 Document Revised: 10/07/2015 Document Reviewed: 06/05/2014 Elsevier Interactive Patient Education  2017 [employer].

## 2020-09-15 NOTE — Progress Notes (Addendum)
Subjective:   Lanyia A Tier is a 80 y.o. female who presents for Medicare Annual (Subsequent) preventive examination.  Review of Systems    No ROS.  Medicare Wellness.   Cardiac Risk Factors include: advanced age (>5men, >54 women);hypertension     Objective:    Today's Vitals   09/15/20 0957  BP: 128/71  Weight: 140 lb (63.5 kg)  Height: 5' 3.5" (1.613 m)   Body mass index is 24.41 kg/m.  Advanced Directives 09/15/2020 09/15/2019 01/10/2019 09/12/2018 07/12/2018 01/10/2018 10/12/2017  Does Patient Have a Medical Advance Directive? No No No No No No No  Would patient like information on creating a medical advance directive? No - Patient declined No - Patient declined No - Patient declined No - Patient declined No - Patient declined No - Patient declined No - Patient declined    Current Medications (verified) Outpatient Encounter Medications as of 09/15/2020  Medication Sig   amLODipine (NORVASC) 5 MG tablet 5 mg daily x 1 week in the am. If BP >130/>80 increase to 10 mg norvasc daily after 1 week in the am.  D/c toprol 25 mg xl qd.   apixaban (ELIQUIS) 5 MG TABS tablet Take 1 tablet (5 mg total) by mouth 2 (two) times daily.   Cholecalciferol (VITAMIN D3) 1000 units CAPS Take 1,000 Units by mouth daily.   Co-Enzyme Q10 200 MG CAPS Take 1 tablet by mouth. Taking 1 every other day   escitalopram (LEXAPRO) 10 MG tablet TAKE 1 TABLET BY MOUTH ONCE DAILY   furosemide (LASIX) 20 MG tablet Take 1 tablet (20 mg total) by mouth daily as needed. Fluid retention   hyoscyamine (LEVBID) 0.375 MG 12 hr tablet TAKE 1 TABLET BY MOUTH ONCE DAILY   losartan (COZAAR) 100 MG tablet TAKE 1 TABLET BY MOUTH ONCE DAILY   metoprolol tartrate (LOPRESSOR) 25 MG tablet Take 1 tablet (25 mg total) by mouth 2 (two) times daily.   potassium chloride SA (KLOR-CON) 20 MEQ tablet TAKE 1 TABLET BY MOUTH ONCE DAILY   psyllium (METAMUCIL) 58.6 % packet Take 1 packet by mouth daily.   rosuvastatin (CRESTOR) 20 MG  tablet TAKE 1 TABLET BY MOUTH AT BEDTIME   VENTOLIN HFA 108 (90 Base) MCG/ACT inhaler INHALE 1 PUFF INTO THE LUNGS EVERY 6 HOURS AS NEEDED WHEEZING OR SHORTNESS OFBREATH.   No facility-administered encounter medications on file as of 09/15/2020.    Allergies (verified) Patient has no known allergies.   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 left chest   OOPHORECTOMY     TONSILLECTOMY  1971   TUBAL LIGATION  1975   VAGINAL PROLAPSE REPAIR  July 2001   Pelvic Prolapse   Family History  Problem Relation Age of Onset   Stroke Mother    Breast cancer Mother 71   Stroke Father    Dementia Father    Cancer Brother        bladder   Stroke Brother    Heart attack Brother    Breast cancer Paternal Aunt 69   Bipolar disorder Son    Social History   Socioeconomic History   Marital status: Married    Spouse name: Not on file   Number of children: Not on file   Years of education: Not on file   Highest education level: Not on file  Occupational History   Not on file  Tobacco Use   Smoking status: Never Smoker   Smokeless tobacco: Never Used  Vaping Use    Vaping Use: Never used  Substance and Sexual Activity   Alcohol use: No   Drug use: No   Sexual activity: Yes  Other Topics Concern   Not on file  Social History Narrative   Not on file   Social Determinants of Health   Financial Resource Strain: Low Risk    Difficulty of Paying Living Expenses: Not hard at all  Food Insecurity: No Food Insecurity   Worried About Charity fundraiser in the Last Year: Never true   [place] in the Last Year: Never true  Transportation Needs: No Transportation Needs   Lack of Transportation (Medical): No   Lack of Transportation (Non-Medical): No  Physical Activity: Insufficiently Active   Days of Exercise per Week: 4 days   Minutes of Exercise per Session: 20 min  Stress: No Stress Concern Present   Feeling of Stress : Not at all  Social Connections: Unknown   Frequency of Communication with Friends and Family: Not on file   Frequency of Social Gatherings with Friends and Family: Not on file   Attends Religious Services: Not on Electrical engineer or Organizations: Not on file   Attends Archivist Meetings: Not on file   Marital Status: Married    Tobacco Counseling Counseling given: Not Answered   Clinical Intake:  Pre-visit preparation completed: Yes        Diabetes: No  How often do you need to have someone help you when you read instructions, pamphlets, or other written materials from your doctor or pharmacy?: 1 - Never    Interpreter Needed?: No      Activities of Daily Living In your present state of health, do you have any difficulty performing the following activities: 09/15/2020  Hearing? N  Vision? N  Difficulty concentrating or making decisions? N  Walking or climbing stairs? N  Dressing or bathing? N  Doing errands, shopping? N  Preparing Food and eating ? N  Using the Toilet? N  In the past six months, have you accidently leaked urine? N  Do you have problems with loss of bowel  control? N  Managing your Medications? N  Managing your Finances? N  Housekeeping or managing your Housekeeping? N  Some recent data might be hidden    Patient Care Team: [name], MD as PCP - General (Internal Medicine) [name], MD as PCP - Cardiology (Cardiology) Bary Castilla Forest Gleason, MD (General  Surgery)  Indicate any recent Medical Services you may have received from other than [facility] providers in the past year (date may be approximate).     Assessment:   This is a routine wellness examination for Maicey.  I connected with Quaneshia today by telephone and verified that I am speaking with the correct person using two identifiers. Location patient: home Location provider: work Persons participating in the virtual visit: patient, Marine scientist.    I discussed the limitations, risks, security and privacy concerns of performing an evaluation and management service by telephone and the availability of in person appointments. I also discussed with the patient that there may be a patient responsible charge related to this service. The patient expressed understanding and verbally consented to this telephonic visit.    Interactive audio and video telecommunications were attempted between this provider and patient, however failed, due to patient having technical difficulties OR patient did not have access to video capability.  We continued and completed visit with audio only.  Some vital signs may be absent or patient reported.   Hearing/Vision screen  Hearing Screening   125Hz  250Hz  500Hz  1000Hz  2000Hz  3000Hz  4000Hz  6000Hz  8000Hz   Right ear:           Left ear:           Comments: Patient is able to hear conversational tones without difficulty. No issues reported.   Vision Screening Comments: Followed by [facility]  Visual acuity not assessed per patient preference since they have regular follow up with the ophthalmologist  Dietary issues and exercise activities  discussed:    Goals Addressed               This Visit's Progress     Patient Stated     Maintain Weight (pt-stated)        Stay hydrated Stay active       Depression Screen [facility] 2/9 Scores 09/15/2020 08/26/2020 09/15/2019 09/12/2018 09/10/2017 01/23/2017 09/04/2016  PHQ - 2 Score 0 0 0 0 0 0 0  PHQ- 9 Score 0 1 - - - - -    Fall Risk Fall Risk  09/15/2020 08/26/2020 02/26/2020 09/15/2019 03/11/2019  Falls in the past year? 0 0 0 0 0  Number falls in past yr: 0 - - - -  Follow up Falls evaluation completed Falls evaluation completed Falls evaluation completed Falls evaluation completed Falls evaluation completed    [place]: Handrails in use when climbing stairs? Yes Home free of loose throw rugs in walkways, pet beds, electrical cords, etc? Yes  Adequate lighting in your home to reduce risk of falls? Yes   ASSISTIVE DEVICES UTILIZED TO PREVENT FALLS: Life alert? No  Use of a cane, walker or w/c? No  Grab bars in the bathroom? No  Shower chair or bench in shower? No  Elevated toilet seat or a handicapped toilet? No   TIMED UP AND GO: Was the test performed? Yes .  Length of time to ambulate 10 feet: 10 sec.    Gait steady and fast without use of assistive device  Cognitive Function: Patient is alert and oriented x3.  Denies difficulty focusing, making decisions, memory loss.  Enjoys working, socializing and other brain health activities.   MMSE - Mini Mental State Exam 09/10/2017 09/04/2016 05/20/2015  Orientation to time 5 5 5   Orientation to Place 5 5 5   Registration 3 3 3   Attention/ Calculation 5 5 5   Recall 3 2  3  Language- name 2 objects 2 2 2   Language- repeat 1 1 1   Language- follow 3 step command 3 3 3   Language- read & follow direction 1 1 1   Write a sentence 1 1 1   Copy design 1 1 1   Total score 30 29 30      6CIT Screen 09/15/2020 09/15/2019 09/12/2018  What Year? 0 points 0 points 0 points  What month? 0 points 0 points 0  points  What time? 0 points 0 points 0 points  Count back from 20 0 points - 0 points  Months in reverse 0 points - 0 points  Repeat phrase 0 points - 0 points  Total Score 0 - 0    Immunizations Immunization History  Administered Date(s) Administered   Fluad Quad(high Dose 65+) 02/13/2019, 03/05/2020   Influenza Split 01/16/2013, 02/26/2014   Influenza, High Dose Seasonal PF 01/10/2016, 02/19/2017, 03/08/2018   Influenza,inj,Quad PF,6+ Mos 04/21/2015   PFIZER(Purple Top)SARS-COV-2 Vaccination 05/22/2019, 06/12/2019, 06/16/2020   Pneumococcal Conjugate-13 06/24/2014   Pneumococcal Polysaccharide-23 04/01/2018   Pneumococcal-Unspecified 05/16/2011   Td 11/12/2012   Tdap 11/21/2012   Zoster 02/23/2015   Health Maintenance Health Maintenance  Topic Date Due   DEXA SCAN  03/26/2020   MAMMOGRAM  08/26/2020   INFLUENZA VACCINE  12/13/2020   COVID-19 Vaccine (4 - Booster for [place] series) 12/14/2020   COLONOSCOPY (Pts 45-51yrs Insurance coverage will need to be confirmed)  06/11/2021   TETANUS/TDAP  11/22/2022   Hepatitis C Screening  Completed   PNA vac Low Risk Adult  Completed   HPV VACCINES  Aged Out    Colorectal cancer screening: Type of screening: Colonoscopy. Completed 06/11/18. Repeat every 3 years   Mammogram status: Completed 08/27/19. Repeat every year  Lung Cancer Screening: (Low Dose CT Chest recommended if Age 74-80 years, 30 pack-year currently smoking OR have quit w/in 15years.) does not qualify.   Hepatitis C Screening: does not qualify  Vision Screening: Recommended annual ophthalmology exams for early detection of glaucoma and other disorders of the eye. Is the patient up to date with their annual eye exam?  Yes  Who is the provider or what is the name of the office in which the patient attends annual eye exams? [facility]  Dental Screening: Recommended annual dental exams for proper oral hygiene.  Community Resource Referral / Chronic Care  Management: CRR required this visit?  No   CCM required this visit?  No      Plan:   Keep all routine maintenance appointments.   Next scheduled lab 11/18/20 @ 8:30  Follow up 11/19/20 @ 2:00  I have personally reviewed and noted the following in the patient's chart:   Medical and social history Use of alcohol, tobacco or illicit drugs  Current medications and supplements including opioid prescriptions.  Functional ability and status Nutritional status Physical activity Advanced directives List of other physicians Hospitalizations, surgeries, and ER visits in previous 12 months Vitals Screenings to include cognitive, depression, and falls Referrals and appointments  In addition, I have reviewed and discussed with patient certain preventive protocols, quality metrics, and best practice recommendations. A written personalized care plan for preventive services as well as general preventive health recommendations were provided to patient.     OBrien-Blaney, Jammie Troup L, LPN   07/16/5454     I have reviewed the above information and agree with above.   Deborra Medina, MD

## 2020-09-20 ENCOUNTER — Ambulatory Visit
Admission: RE | Admit: 2020-09-20 | Discharge: 2020-09-20 | Disposition: A | Discharge status: Home / Self Care | Payer: Medicare Other | Source: Ambulatory Visit | Attending: Internal Medicine | Admitting: Internal Medicine

## 2020-09-20 ENCOUNTER — Other Ambulatory Visit: Payer: Self-pay

## 2020-09-20 DIAGNOSIS — Z1231 Encounter for screening mammogram for malignant neoplasm of breast: Secondary | ICD-10-CM | POA: Insufficient documentation

## 2020-09-24 ENCOUNTER — Other Ambulatory Visit: Payer: Self-pay | Admitting: Internal Medicine

## 2020-09-28 DIAGNOSIS — C8590 Non-Hodgkin lymphoma, unspecified, unspecified site: Secondary | ICD-10-CM | POA: Insufficient documentation

## 2020-09-28 DIAGNOSIS — C8283 Other types of follicular lymphoma, intra-abdominal lymph nodes: Secondary | ICD-10-CM | POA: Diagnosis not present

## 2020-09-28 DIAGNOSIS — C761 Malignant neoplasm of thorax: Secondary | ICD-10-CM | POA: Diagnosis not present

## 2020-09-28 DIAGNOSIS — M47816 Spondylosis without myelopathy or radiculopathy, lumbar region: Secondary | ICD-10-CM | POA: Diagnosis not present

## 2020-09-28 DIAGNOSIS — M5137 Other intervertebral disc degeneration, lumbosacral region: Secondary | ICD-10-CM | POA: Diagnosis not present

## 2020-09-28 DIAGNOSIS — I700 Atherosclerosis of aorta: Secondary | ICD-10-CM | POA: Diagnosis not present

## 2020-09-28 DIAGNOSIS — M7062 Trochanteric bursitis, left hip: Secondary | ICD-10-CM | POA: Diagnosis not present

## 2020-09-28 DIAGNOSIS — M7918 Myalgia, other site: Secondary | ICD-10-CM | POA: Diagnosis not present

## 2020-09-28 DIAGNOSIS — I480 Paroxysmal atrial fibrillation: Secondary | ICD-10-CM | POA: Diagnosis not present

## 2020-09-28 DIAGNOSIS — M25552 Pain in left hip: Secondary | ICD-10-CM | POA: Diagnosis not present

## 2020-09-29 ENCOUNTER — Other Ambulatory Visit: Payer: Self-pay | Admitting: Internal Medicine

## 2020-10-04 ENCOUNTER — Other Ambulatory Visit: Payer: Self-pay

## 2020-10-04 ENCOUNTER — Encounter: Payer: Self-pay | Admitting: Internal Medicine

## 2020-10-04 ENCOUNTER — Ambulatory Visit (INDEPENDENT_AMBULATORY_CARE_PROVIDER_SITE_OTHER): Payer: Medicare Other | Admitting: Internal Medicine

## 2020-10-04 DIAGNOSIS — M47818 Spondylosis without myelopathy or radiculopathy, sacral and sacrococcygeal region: Secondary | ICD-10-CM

## 2020-10-04 DIAGNOSIS — N281 Cyst of kidney, acquired: Secondary | ICD-10-CM

## 2020-10-04 DIAGNOSIS — D6869 Other thrombophilia: Secondary | ICD-10-CM | POA: Diagnosis not present

## 2020-10-04 DIAGNOSIS — E785 Hyperlipidemia, unspecified: Secondary | ICD-10-CM

## 2020-10-04 DIAGNOSIS — I700 Atherosclerosis of aorta: Secondary | ICD-10-CM

## 2020-10-04 DIAGNOSIS — I701 Atherosclerosis of renal artery: Secondary | ICD-10-CM

## 2020-10-04 DIAGNOSIS — M7062 Trochanteric bursitis, left hip: Secondary | ICD-10-CM

## 2020-10-04 DIAGNOSIS — C8213 Follicular lymphoma grade II, intra-abdominal lymph nodes: Secondary | ICD-10-CM | POA: Diagnosis not present

## 2020-10-04 DIAGNOSIS — I480 Paroxysmal atrial fibrillation: Secondary | ICD-10-CM | POA: Diagnosis not present

## 2020-10-04 DIAGNOSIS — I10 Essential (primary) hypertension: Secondary | ICD-10-CM | POA: Diagnosis not present

## 2020-10-04 DIAGNOSIS — I129 Hypertensive chronic kidney disease with stage 1 through stage 4 chronic kidney disease, or unspecified chronic kidney disease: Secondary | ICD-10-CM

## 2020-10-04 MED ORDER — ESCITALOPRAM OXALATE 10 MG PO TABS: 1.0000 | ORAL_TABLET | Freq: Every day | ORAL | 1 refills | Status: DC

## 2020-10-04 NOTE — Patient Instructions (Addendum)
Your kidneys are fine.  You do not have a blockage,  And the simple cyst does not need to be removed.  It is benign   Continue your current medications and return in 6 months    If you develop a rapid heart rate,  You can take an extra 1/2 dose of metoprolol to slow it down.

## 2020-10-04 NOTE — Progress Notes (Signed)
Subjective:  Patient ID: Whitney Molina, female    DOB: 01/29/1941  Age: 80 y.o. MRN: [id number]  CC: Diagnoses of Primary hypertension, Renal artery stenosis ([place]), Hyperlipidemia LDL goal <130, Abdominal aortic atherosclerosis ([place]), Paroxysmal atrial fibrillation ([place]), Follicular lymphoma grade ii, intra-abdominal lymph nodes (H[facility]), Trochanteric bursitis of left hip, SI joint arthritis, Acquired thrombophilia ([place]), Renal cyst, and Renal hypertension were pertinent to this visit.  HPI Whitney Molina presents for follow up on renal doppler ultrasound, atrial fib with cronoic anticoagulation and CKD stage 3   This visit occurred during the SARS-CoV-2 public health emergency.  Safety protocols were in place, including screening questions prior to the visit, additional usage of staff PPE, and extensive cleaning of exam room while observing appropriate contact time as indicated for disinfecting solutions.   1) CKD Stage 3  :  Noted in Dec 2021 with improvement in GFR since then.  Most recent GFR was 59 in march.  Not taking NSAIDS.  BP is at at goal on amlodipine 5 mg , losartan 100 mg daily and metoprolol 25 mg bid.   2) Thrombpphilia secondary to PAF : she is anticoagulated with Eliquis and has had no complications:  No recent or history of falls, but had Eliquis suspended when recent ESI was done by Bar[facility] Lyme   3) Chronic pain:  She has experienced  relief of lumbar spine and left hip/SI joint pain that was causing  radiculopathy to left leg s/p steroid injection by Orthopedics.  Pain has completely resolved for the first time in years.     3) Renal artery doppler:  Ordered by [facility] for evaluation of HTN requiring 3 medications.  12 mm simple cyst noted on right renal pelvis,  No evidence of RAS . Results reviewed with patient and husband.    Outpatient Medications Prior to Visit  Medication Sig Dispense Refill  . apixaban (ELIQUIS) 5 MG TABS tablet Take 1 tablet (5 mg  total) by mouth 2 (two) times daily. 60 tablet 5  . Cholecalciferol (VITAMIN D3) 1000 units CAPS Take 1,000 Units by mouth daily.    Marland Kitchen Co-Enzyme Q10 200 MG CAPS Take 1 tablet by mouth. Taking 1 every other day    . hyoscyamine (LEVBID) 0.375 MG 12 hr tablet TAKE 1 TABLET BY MOUTH ONCE DAILY 90 tablet 1  . losartan (COZAAR) 100 MG tablet TAKE 1 TABLET BY MOUTH ONCE DAILY 90 tablet 1  . metoprolol tartrate (LOPRESSOR) 25 MG tablet Take 1 tablet (25 mg total) by mouth 2 (two) times daily. 60 tablet 3  . potassium chloride SA (KLOR-CON) 20 MEQ tablet TAKE 1 TABLET BY MOUTH ONCE DAILY 30 tablet 0  . psyllium (METAMUCIL) 58.6 % packet Take 1 packet by mouth daily.    . rosuvastatin (CRESTOR) 20 MG tablet TAKE 1 TABLET BY MOUTH AT BEDTIME 30 tablet 2  . VENTOLIN HFA 108 (90 Base) MCG/ACT inhaler INHALE 1 PUFF INTO THE LUNGS EVERY 6 HOURS AS NEEDED WHEEZING OR SHORTNESS OFBREATH. 18 g 2  . amLODipine (NORVASC) 5 MG tablet 5 mg daily x 1 week in the am. If BP >130/>80 increase to 10 mg norvasc daily after 1 week in the am.  D/c toprol 25 mg xl qd. 90 tablet 3  . furosemide (LASIX) 20 MG tablet TAKE 1 TABLET BY MOUTH ONCE DAILY AS NEEDED FLUID RETENTION (Patient not taking: Reported on 10/04/2020) 30 tablet 3  . escitalopram (LEXAPRO) 10 MG tablet TAKE 1 TABLET BY MOUTH ONCE  DAILY (Patient not taking: Reported on 10/04/2020) 90 tablet 1   No facility-administered medications prior to visit.    Review of Systems;  Patient denies headache, fevers, malaise, unintentional weight loss, skin rash, eye pain, sinus congestion and sinus pain, sore throat, dysphagia,  hemoptysis , cough, dyspnea, wheezing, chest pain, palpitations, orthopnea, edema, abdominal pain, nausea, melena, diarrhea, constipation, flank pain, dysuria, hematuria, urinary  Frequency, nocturia, numbness, tingling, seizures,  Focal weakness, Loss of consciousness,  Tremor, insomnia, depression, anxiety, and suicidal ideation.      Objective:   BP 122/70 (BP Location: Left Arm, Patient Position: Sitting, Cuff Size: Normal)   Pulse (!) 58   Temp 98.1 F (36.7 C) (Oral)   Ht 5' 3.5" (1.613 m)   Wt 140 lb (63.5 kg)   SpO2 98%   BMI 24.41 kg/m   BP Readings from Last 3 Encounters:  10/04/20 122/70  09/15/20 128/71  08/26/20 130/68    Wt Readings from Last 3 Encounters:  10/04/20 140 lb (63.5 kg)  09/15/20 140 lb (63.5 kg)  08/26/20 140 lb 9.6 oz (63.8 kg)    General appearance: alert, cooperative and appears stated age Ears: normal TM's and external ear canals both ears Throat: lips, mucosa, and tongue normal; teeth and gums normal Neck: no adenopathy, no carotid bruit, supple, symmetrical, trachea midline and thyroid not enlarged, symmetric, no tenderness/mass/nodules Back: symmetric, no curvature. ROM normal. No CVA tenderness. Lungs: clear to auscultation bilaterally Heart: regular rate and rhythm, S1, S2 normal, no murmur, click, rub or gallop Abdomen: soft, non-tender; bowel sounds normal; no masses,  no organomegaly Pulses: 2+ and symmetric Skin: Skin color, texture, turgor normal. No rashes or lesions Lymph nodes: Cervical, supraclavicular, and axillary nodes normal.  Lab Results  Component Value Date   HGBA1C 6.3 04/20/2020   HGBA1C 5.9 04/01/2018   HGBA1C 6.0 02/19/2017    Lab Results  Component Value Date   CREATININE 0.98 07/21/2020   CREATININE 1.03 05/25/2020   CREATININE 1.38 (H) 04/26/2020    Lab Results  Component Value Date   WBC 6.4 07/21/2020   HGB 12.4 07/21/2020   HCT 37.4 07/21/2020   PLT 208 07/21/2020   GLUCOSE 88 07/21/2020   CHOL 106 04/20/2020   TRIG 151.0 (H) 04/20/2020   HDL 31.40 (L) 04/20/2020   LDLDIRECT 145.0 04/01/2018   LDLCALC 45 04/20/2020   ALT 20 07/21/2020   AST 22 07/21/2020   NA 137 07/21/2020   K 4.1 07/21/2020   CL 100 07/21/2020   CREATININE 0.98 07/21/2020   BUN 19 07/21/2020   CO2 28 07/21/2020   TSH 1.19 04/20/2020   INR 1.02 03/24/2015    HGBA1C 6.3 04/20/2020   MICROALBUR 9.7 (H) 04/20/2020    MM 3D SCREEN BREAST BILATERAL  Result Date: 09/20/2020 CLINICAL DATA:  Screening. EXAM: DIGITAL SCREENING BILATERAL MAMMOGRAM WITH TOMOSYNTHESIS AND CAD TECHNIQUE: Bilateral screening digital craniocaudal and mediolateral oblique mammograms were obtained. Bilateral screening digital breast tomosynthesis was performed. The images were evaluated with computer-aided detection. COMPARISON:  Previous exam(s). ACR Breast Density Category c: The breast tissue is heterogeneously dense, which may obscure small masses. FINDINGS: There are no findings suspicious for malignancy. The images were evaluated with computer-aided detection. IMPRESSION: No mammographic evidence of malignancy. A result letter of this screening mammogram will be mailed directly to the patient. RECOMMENDATION: Screening mammogram in one year. (Code:SM-B-01Y) BI-RADS CATEGORY  1: Negative. Electronically Signed   By: Audie Pinto M.D.   On: 09/20/2020 13:31  Assessment & Plan:   Problem List Items Addressed This Visit      Medium   Hyperlipidemia LDL goal <130    She is tolerating rosuvastatin and LDL is well below 70.  LFTs normal.  No changes today   Lab Results  Component Value Date   CHOL 106 04/20/2020   HDL 31.40 (L) 04/20/2020   LDLCALC 45 04/20/2020   LDLDIRECT 145.0 04/01/2018   TRIG 151.0 (H) 04/20/2020   CHOLHDL 3 04/20/2020         Relevant Medications   amLODipine (NORVASC) 5 MG tablet     Unprioritized   Abdominal aortic atherosclerosis ([place])    Reviewed findings of prior CT scan today..  Patient is tolerating Crestor without side effects.       Relevant Medications   amLODipine (NORVASC) 5 MG tablet   Acquired thrombophilia ([place])    Secondary to PAF.  Reviewed risk of embolic CVA with patient and advised her to continue Eliquis       Follicular lymphoma grade ii, intra-abdominal lymph nodes ([place])    Currently in remission s/p rituximab  finished in 2019.  Surveillance and treatment managed by Dr Rogue Bussing       Paroxysmal atrial fibrillation [facility])    Currently in sinus rhythm with recent episode reported suggestive of a run. Continue Lopressor 25 mg twice daily, Eliquis 5 mg twice daily for CHADS score of 4       Relevant Medications   amLODipine (NORVASC) 5 MG tablet   Renal cyst    Simple,  Stable.  No further workup      Renal hypertension    Improved control on current regimen of amlodipine, Losartan and metoprolol.    RAS ruled out with renal artery doppler.  No changes today      Relevant Medications   amLODipine (NORVASC) 5 MG tablet   SI joint arthritis    With degenerative changes and osteophyte formation per [facility]      Trochanteric bursitis of left hip    S/p LEFT ultrasound guided greater trochanteric bursa Injection last week by DO Kubinski at [facility] ,  With relief of left leg pain         Other Visit Diagnoses    Primary hypertension       Relevant Medications   amLODipine (NORVASC) 5 MG tablet   Renal artery stenosis (HCC)       Relevant Medications   amLODipine (NORVASC) 5 MG tablet      I have changed Whitney Molina's escitalopram and amLODipine. I am also having her maintain her Vitamin D3, Co-Enzyme Q10, hyoscyamine, Ventolin HFA, losartan, metoprolol tartrate, apixaban, psyllium, rosuvastatin, furosemide, and potassium chloride SA.  Meds ordered this encounter  Medications  . escitalopram (LEXAPRO) 10 MG tablet    Sig: Take 1 tablet (10 mg total) by mouth daily.    Dispense:  90 tablet    Refill:  1  . amLODipine (NORVASC) 5 MG tablet    Sig: 5 mg daily x 1 week in the am.    Dispense:  90 tablet    Refill:  3    Medications Discontinued During This Encounter  Medication Reason  . escitalopram (LEXAPRO) 10 MG tablet Reorder  . amLODipine (NORVASC) 5 MG tablet    I provided  30 minutes of  face-to-face time during this encounter reviewing patient's current  problems and past surgeries, labs and imaging studies, providing counseling on the above mentioned problems , and  coordination  of care .  Follow-up: Return in about 6 months (around 04/06/2021).   [name], MD

## 2020-10-05 DIAGNOSIS — D6869 Other thrombophilia: Secondary | ICD-10-CM | POA: Insufficient documentation

## 2020-10-05 DIAGNOSIS — M47818 Spondylosis without myelopathy or radiculopathy, sacral and sacrococcygeal region: Secondary | ICD-10-CM | POA: Insufficient documentation

## 2020-10-05 DIAGNOSIS — M7062 Trochanteric bursitis, left hip: Secondary | ICD-10-CM | POA: Insufficient documentation

## 2020-10-05 MED ORDER — AMLODIPINE BESYLATE 5 MG PO TABS: ORAL_TABLET | ORAL | 3 refills | Status: DC

## 2020-10-05 NOTE — Assessment & Plan Note (Signed)
Reviewed findings of prior CT scan today..  Patient is tolerating Crestor without side effects.

## 2020-10-05 NOTE — Assessment & Plan Note (Signed)
S/p LEFT ultrasound guided greater trochanteric bursa Injection last week by DO Kubinski at [facility] ,  With relief of left leg pain

## 2020-10-05 NOTE — Assessment & Plan Note (Signed)
Secondary to PAF.  Reviewed risk of embolic CVA with patient and advised her to continue Eliquis

## 2020-10-05 NOTE — Assessment & Plan Note (Signed)
Improved control on current regimen of amlodipine, Losartan and metoprolol.    RAS ruled out with renal artery doppler.  No changes today

## 2020-10-05 NOTE — Assessment & Plan Note (Signed)
Currently in sinus rhythm with recent episode reported suggestive of a run. Continue Lopressor 25 mg twice daily, Eliquis 5 mg twice daily for CHADS score of 4

## 2020-10-05 NOTE — Assessment & Plan Note (Signed)
With degenerative changes and osteophyte formation per [facility]

## 2020-10-05 NOTE — Assessment & Plan Note (Addendum)
Currently in remission s/p rituximab finished in 2019.  Surveillance and treatment managed by Dr Rogue Bussing

## 2020-10-05 NOTE — Assessment & Plan Note (Signed)
Simple,  Stable.  No further workup

## 2020-10-05 NOTE — Assessment & Plan Note (Signed)
She is tolerating rosuvastatin and LDL is well below 70.  LFTs normal.  No changes today   Lab Results  Component Value Date   CHOL 106 04/20/2020   HDL 31.40 (L) 04/20/2020   LDLCALC 45 04/20/2020   LDLDIRECT 145.0 04/01/2018   TRIG 151.0 (H) 04/20/2020   CHOLHDL 3 04/20/2020

## 2020-10-31 ENCOUNTER — Other Ambulatory Visit: Payer: Self-pay | Admitting: Internal Medicine

## 2020-11-01 ENCOUNTER — Other Ambulatory Visit: Payer: Self-pay | Admitting: *Deleted

## 2020-11-01 MED ORDER — METOPROLOL TARTRATE 25 MG PO TABS: 25.0000 mg | ORAL_TABLET | Freq: Two times a day (BID) | ORAL | 3 refills | Status: DC

## 2020-11-22 ENCOUNTER — Other Ambulatory Visit: Payer: Self-pay | Admitting: Primary Care

## 2020-11-24 ENCOUNTER — Other Ambulatory Visit: Payer: Self-pay | Admitting: Primary Care

## 2020-11-26 ENCOUNTER — Other Ambulatory Visit: Payer: Self-pay

## 2020-11-26 ENCOUNTER — Encounter: Payer: Self-pay | Admitting: Internal Medicine

## 2020-11-26 ENCOUNTER — Ambulatory Visit (INDEPENDENT_AMBULATORY_CARE_PROVIDER_SITE_OTHER): Payer: Medicare Other | Admitting: Internal Medicine

## 2020-11-26 ENCOUNTER — Ambulatory Visit: Payer: Medicare Other | Admitting: Cardiology

## 2020-11-26 ENCOUNTER — Encounter: Payer: Self-pay | Admitting: Cardiology

## 2020-11-26 ENCOUNTER — Ambulatory Visit (INDEPENDENT_AMBULATORY_CARE_PROVIDER_SITE_OTHER): Payer: Medicare Other

## 2020-11-26 VITALS — BP 124/80 | HR 96 | Temp 98.0°F | Ht 63.5 in | Wt 140.4 lb

## 2020-11-26 VITALS — BP 110/60 | HR 106 | Ht 63.5 in | Wt 141.0 lb

## 2020-11-26 DIAGNOSIS — I959 Hypotension, unspecified: Secondary | ICD-10-CM | POA: Diagnosis not present

## 2020-11-26 DIAGNOSIS — R079 Chest pain, unspecified: Secondary | ICD-10-CM | POA: Diagnosis not present

## 2020-11-26 DIAGNOSIS — R059 Cough, unspecified: Secondary | ICD-10-CM | POA: Diagnosis not present

## 2020-11-26 DIAGNOSIS — E7800 Pure hypercholesterolemia, unspecified: Secondary | ICD-10-CM

## 2020-11-26 DIAGNOSIS — N3000 Acute cystitis without hematuria: Secondary | ICD-10-CM

## 2020-11-26 DIAGNOSIS — I4891 Unspecified atrial fibrillation: Secondary | ICD-10-CM

## 2020-11-26 DIAGNOSIS — I10 Essential (primary) hypertension: Secondary | ICD-10-CM

## 2020-11-26 LAB — CBC WITH DIFFERENTIAL/PLATELET
Basophils Absolute: 0 10*3/uL (ref 0.0–0.1)
Basophils Relative: 0.6 % (ref 0.0–3.0)
Eosinophils Absolute: 0.1 10*3/uL (ref 0.0–0.7)
Eosinophils Relative: 1.7 % (ref 0.0–5.0)
HCT: 36.2 % (ref 36.0–46.0)
Hemoglobin: 12.5 g/dL (ref 12.0–15.0)
Lymphocytes Relative: 25.8 % (ref 12.0–46.0)
Lymphs Abs: 2 10*3/uL (ref 0.7–4.0)
MCHC: 34.6 g/dL (ref 30.0–36.0)
MCV: 94.7 fl (ref 78.0–100.0)
Monocytes Absolute: 0.7 10*3/uL (ref 0.1–1.0)
Monocytes Relative: 8.6 % (ref 3.0–12.0)
Neutro Abs: 4.9 10*3/uL (ref 1.4–7.7)
Neutrophils Relative %: 63.3 % (ref 43.0–77.0)
Platelets: 222 10*3/uL (ref 150.0–400.0)
RBC: 3.82 Mil/uL — ABNORMAL LOW (ref 3.87–5.11)
RDW: 13.7 % (ref 11.5–15.5)
WBC: 7.7 10*3/uL (ref 4.0–10.5)

## 2020-11-26 LAB — COMPREHENSIVE METABOLIC PANEL
ALT: 18 U/L (ref 0–35)
AST: 19 U/L (ref 0–37)
Albumin: 4.3 g/dL (ref 3.5–5.2)
Alkaline Phosphatase: 72 U/L (ref 39–117)
BUN: 16 mg/dL (ref 6–23)
CO2: 29 mEq/L (ref 19–32)
Calcium: 9.2 mg/dL (ref 8.4–10.5)
Chloride: 104 mEq/L (ref 96–112)
Creatinine, Ser: 1.07 mg/dL (ref 0.40–1.20)
GFR: 49.47 mL/min — ABNORMAL LOW (ref >60.00)
Glucose, Bld: 84 mg/dL (ref 70–99)
Potassium: 3.8 mEq/L (ref 3.5–5.1)
Sodium: 140 mEq/L (ref 135–145)
Total Bilirubin: 0.7 mg/dL (ref 0.2–1.2)
Total Protein: 7.5 g/dL (ref 6.0–8.3)

## 2020-11-26 LAB — MAGNESIUM: Magnesium: 2.2 mg/dL (ref 1.5–2.5)

## 2020-11-26 LAB — TROPONIN I (HIGH SENSITIVITY): High Sens Troponin I: 5 ng/L (ref 2–17)

## 2020-11-26 MED ORDER — METOPROLOL TARTRATE 25 MG PO TABS
25.0000 mg | ORAL_TABLET | Freq: Three times a day (TID) | ORAL | 3 refills | Status: DC
Start: 2020-11-26 — End: 2020-11-30

## 2020-11-26 NOTE — Progress Notes (Signed)
Cardiology Office Note:    Date:  11/26/2020   ID:  Whitney Molina, DOB 02/10/1941, MRN [id number]  PCP:  [name], MD   [place]  Cardiologist:  [name], MD  Advanced Practice Provider:  No care team member to display Electrophysiologist:  None       Referring MD: [name], MD   Chief Complaint  Patient presents with   Other    Afib RVR. Meds reviewed verbally with patient.    History of Present Illness:    Whitney Molina is a 80 y.o. female with a hx of hypertension, hyperlipidemia, atrial fibrillation who presents due to fatigue.  Patient has felt out of breath and fatigued with occasional palpitations over the past week.  Presented to primary care provider today due to symptoms.  EKG showed atrial fibrillation with heart rate 106.  Denies chest pain, endorses fatigue.  Has not missed a dose of Eliquis over the past 2 months.  Takes her Lopressor as prescribed.  Checked blood pressure at home, systolic was in the 89V.  Prior notes Echocardiogram 10/9448 normal systolic function, EF 60 to 65%, normal LA size.  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 left chest   OOPHORECTOMY     TONSILLECTOMY  1971   TUBAL LIGATION  1975   VAGINAL PROLAPSE REPAIR  July 2001   Pelvic Prolapse    Current Medications: Current Meds  Medication Sig   albuterol (VENTOLIN HFA) 108 (90 Base) MCG/ACT inhaler INHALE 1 PUFFS INTO THE LUNGS EVERY 6 HOURS AS NEEDED FOR WHEEZING OR SHORTNESS OF BREATH   apixaban (ELIQUIS) 5 MG TABS tablet Take 1 tablet (5 mg total) by mouth 2 (two) times daily.   Cholecalciferol (VITAMIN D3) 1000 units CAPS Take 1,000 Units by mouth daily.   Co-Enzyme Q10 200 MG CAPS Take 1 tablet by mouth. Taking 1 every other day   escitalopram (LEXAPRO) 10 MG tablet Take 1 tablet (10 mg total) by mouth daily.   furosemide (LASIX) 20 MG tablet TAKE 1 TABLET BY MOUTH ONCE DAILY AS NEEDED FLUID RETENTION   hyoscyamine (LEVBID) 0.375 MG 12 hr tablet TAKE 1 TABLET BY MOUTH ONCE DAILY   losartan (COZAAR) 100 MG tablet TAKE 1 TABLET BY MOUTH ONCE DAILY   potassium chloride SA (KLOR-CON) 20 MEQ tablet TAKE 1 TABLET BY MOUTH ONCE DAILY   psyllium (METAMUCIL) 58.6 % packet Take 1 packet by mouth daily.  rosuvastatin (CRESTOR) 20 MG tablet TAKE 1 TABLET BY MOUTH AT BEDTIME   [DISCONTINUED] amLODipine (NORVASC) 5 MG tablet 5 mg daily x 1 week in the am.   [DISCONTINUED] metoprolol tartrate (LOPRESSOR) 25 MG tablet Take 1 tablet (25 mg total) by mouth 2 (two) times daily.     Allergies:   Patient has no known allergies.   Social History   Socioeconomic History   Marital status: Married    Spouse name: Not on file   Number of children: Not on file   Years of education: Not on file   Highest education level: Not on file  Occupational History   Not on file  Tobacco Use   Smoking status: Never   Smokeless tobacco: Never  Vaping Use   Vaping Use: Never used  Substance and Sexual Activity   Alcohol use: No   Drug use: No   Sexual activity: Yes  Other Topics Concern   Not on file   Social History Narrative   Not on file   Social Determinants of Health   Financial Resource Strain: Low Risk    Difficulty of Paying Living Expenses: Not hard at all  Food Insecurity: No Food Insecurity   Worried About Charity fundraiser in the Last Year: Never true   [place] in the Last Year: Never true  Transportation Needs: No Transportation Needs   Lack of Transportation (Medical): No   Lack of Transportation (Non-Medical): No  Physical Activity: Insufficiently Active   Days of Exercise per Week: 4 days   Minutes of Exercise per Session: 20 min  Stress: No Stress Concern Present   Feeling of Stress : Not at all  Social Connections: Unknown   Frequency of Communication with Friends and Family: Not on file   Frequency of Social Gatherings with Friends and Family: Not on file   Attends Religious Services: Not on Electrical engineer or Organizations: Not on file   Attends Archivist Meetings: Not on file   Marital Status: Married     Family History: The patient's family history includes Bipolar disorder in her son; Breast cancer (age of onset: 15) in her paternal aunt; Breast cancer (age of onset: 56) in her mother; Cancer in her brother; Dementia in her father; Heart attack in her brother; Stroke in her brother, father, and mother.  ROS:   Please see the history of present illness.     All other systems reviewed and are negative.  EKGs/Labs/Other Studies Reviewed:    The following studies were reviewed today:   EKG:  EKG not  ordered today.    EKG from PCPs office obtained today showing atrial fibrillation, heart rate 106.  Recent Labs: 04/20/2020: TSH 1.19 11/26/2020: ALT 18; BUN 16; Creatinine, Ser 1.07; Hemoglobin 12.5; Magnesium 2.2; Platelets 222.0; Potassium 3.8; Sodium 140  Recent Lipid Panel    Component Value Date/Time   CHOL 106 04/20/2020 0953   TRIG 151.0 (H) 04/20/2020 0953   HDL 31.40 (L) 04/20/2020 0953   CHOLHDL 3  04/20/2020 0953   VLDL 30.2 04/20/2020 0953   LDLCALC 45 04/20/2020 0953   LDLDIRECT 145.0 04/01/2018 1116     Risk Assessment/Calculations:      Physical Exam:    VS:  BP 110/60 (BP Location: Left Arm, Patient Position: Sitting, Cuff Size: Normal)   Pulse (!) 106   Ht 5' 3.5" (1.613 m)   Wt 141 lb (64 kg)   SpO2 98%  BMI 24.59 kg/m     Wt Readings from Last 3 Encounters:  11/26/20 141 lb (64 kg)  11/26/20 140 lb 6.4 oz (63.7 kg)  10/04/20 140 lb (63.5 kg)     GEN:  Well nourished, well developed in no acute distress HEENT: Normal NECK: No JVD; No carotid bruits LYMPHATICS: No lymphadenopathy CARDIAC: Irregular irregular, no murmurs. RESPIRATORY:  Clear to auscultation without rales, wheezing or rhonchi  ABDOMEN: Soft, non-tender, non-distended MUSCULOSKELETAL:  No edema; No deformity  SKIN: Warm and dry NEUROLOGIC:  Alert and oriented x 3 PSYCHIATRIC:  Normal affect   ASSESSMENT:    1. Atrial fibrillation, unspecified type ([place])   2. Primary hypertension   3. Pure hypercholesterolemia     PLAN:    In order of problems listed above:  History of paroxysmal A. fib, A. fib noted on EKG today, heart rate 106.  CHA2DS2-VASc score 4 (age, htn, gender).  Increase Lopressor 25 mg 3 times daily, Eliquis 5 mg twice daily.  Plan for DC cardioversion in 4 days.  Last echocardiogram showed normal systolic function, EF 60 to 32%, grade 2 diastolic dysfunction, normal LA size. Hypertension, BP controlled..  Lopressor as above, stop Norvasc, continue losartan. Hyperlipidemia, continue statin.   Follow-up in 1 month  Shared Decision Making/Informed Consent The risks (stroke, cardiac arrhythmias rarely resulting in the need for a temporary or permanent pacemaker, skin irritation or burns and complications associated with conscious sedation including aspiration, arrhythmia, respiratory failure and death), benefits (restoration of normal sinus rhythm) and alternatives of a  direct current cardioversion were explained in detail to Ms. Hovanec and she agrees to proceed.     Medication Adjustments/Labs and Tests Ordered: Current medicines are reviewed at length with the patient today.  Concerns regarding medicines are outlined above.  No orders of the defined types were placed in this encounter.  Meds ordered this encounter  Medications   metoprolol tartrate (LOPRESSOR) 25 MG tablet    Sig: Take 1 tablet (25 mg total) by mouth in the morning, at noon, and at bedtime.    Dispense:  90 tablet    Refill:  3     Patient Instructions  Medication Instructions:   Your physician has recommended you make the following change in your medication:    STOP taking your Amlodipine.   2.    INCREASE your Metoprolol Tartrate (Lopressor) to 25 MG three times a day.   *If you need a refill on your cardiac medications before your next appointment, please call your pharmacy*   Lab Work: None ordered If you have labs (blood work) drawn today and your tests are completely normal, you will receive your results only by: [place] (if you have MyChart) OR A paper copy in the mail If you have any lab test that is abnormal or we need to change your treatment, we will call you to review the results.   Testing/Procedures:  You are scheduled for a Cardioversion on __Tues 7/19/22______________ with Dr.__Agbor-Etang_________ Please arrive at the [facility] at ___0645______ a.m. on the day of your procedure.  DIET INSTRUCTIONS:  Nothing to eat or drink after midnight except your medications with a sip of water.    Labs: __already drawn________________  Medications:  YOU MAY TAKE ALL of your remaining medications with a small amount of water.  Must have a responsible person to drive you home.  Bring a current list of your medications and current insurance cards.    If you have  any questions after you get home, please call the office at [phone]     Follow-Up: At [facility], you and your health needs are our priority.  As part of our continuing mission to provide you with exceptional heart care, we have created designated Provider Care Teams.  These Care Teams include your primary Cardiologist (physician) and Advanced Practice Providers (APPs -  Physician Assistants and Nurse Practitioners) who all work together to provide you with the care you need, when you need it.  We recommend signing up for the patient portal called "MyChart".  Sign up information is provided on this After Visit Summary.  MyChart is used to connect with patients for Virtual Visits (Telemedicine).  Patients are able to view lab/test results, encounter notes, upcoming appointments, etc.  Non-urgent messages can be sent to your provider as well.   To learn more about what you can do with MyChart, go to [email or web address].    Your next appointment:   1 month(s)  The format for your next appointment:   In Person  Provider:   You may see [name], MD or one of the following Advanced Practice Providers on your designated Care Team:   Murray Hodgkins, NP Christell Faith, PA-C Marrianne Mood, PA-C Cadence [place], [place]   Other Instructions    Signed, [name], MD  11/26/2020 4:26 PM    [place]

## 2020-11-26 NOTE — Progress Notes (Signed)
Chief Complaint  Patient presents with   Hypertension   F/u with husband Friday heart racing h/o Afib and Saturday chest pain Friday had been cleaning and since Friday pulse so fast she cant feel it and felt dizziness, fatigue, chest pain again Monday and sat sbp was 85 on norvasc 5 mg qd, eliquis 5 mg bid, lasix 20 mg qd, losartan 100 mg qd, lopressor 25 mg bid. She thought taking lexapro was helping and felt stress recently and also stress or cleaning windows of property trying to sell  Husband also mentioned had coughing fit w/in the last 1 week choked on food or drink not sure if related   Review of Systems  Constitutional:  Positive for malaise/fatigue. Negative for weight loss.  Cardiovascular:  Positive for chest pain and palpitations.  Gastrointestinal:  Negative for abdominal pain.  Skin:  Negative for rash.  Psychiatric/Behavioral:  The patient is nervous/anxious.   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 left chest   OOPHORECTOMY     TONSILLECTOMY  1971   TUBAL LIGATION  1975   VAGINAL PROLAPSE REPAIR  July 2001   Pelvic Prolapse   Family History  Problem  Relation Age of Onset   Stroke Mother    Breast cancer Mother 24   Stroke Father    Dementia Father    Cancer Brother        bladder   Stroke Brother    Heart attack Brother    Breast cancer Paternal Aunt 78   Bipolar disorder Son    Social History   Socioeconomic History   Marital status: Married    Spouse name: Not on file   Number of children: Not on file   Years of education: Not on file   Highest education level: Not on file  Occupational History   Not on file  Tobacco Use   Smoking status: Never   Smokeless tobacco: Never  Vaping Use   Vaping Use: Never used  Substance and Sexual Activity   Alcohol use: No   Drug use: No   Sexual activity: Yes  Other Topics Concern   Not on file  Social History Narrative   Not on file   Social Determinants of Health   Financial Resource Strain: Low Risk    Difficulty of Paying Living Expenses: Not hard at all  Food Insecurity: No Food Insecurity   Worried About Estate manager/land agent of Food in the Last Year: Never true   Ran Out  of Food in the Last Year: Never true  Transportation Needs: No Transportation Needs   Lack of Transportation (Medical): No   Lack of Transportation (Non-Medical): No  Physical Activity: Insufficiently Active   Days of Exercise per Week: 4 days   Minutes of Exercise per Session: 20 min  Stress: No Stress Concern Present   Feeling of Stress : Not at all  Social Connections: Unknown   Frequency of Communication with Friends and Family: Not on file   Frequency of Social Gatherings with Friends and Family: Not on file   Attends Religious Services: Not on file   Active Member of Clubs or Organizations: Not on file   Attends Archivist Meetings: Not on file   Marital Status: Married  Human resources officer Violence: Not At Risk   Fear of Current or Ex-Partner: No   Emotionally Abused: No   Physically Abused: No   Sexually Abused: No   Current Meds  Medication Sig   albuterol (VENTOLIN HFA) 108 (90  Base) MCG/ACT inhaler INHALE 1 PUFFS INTO THE LUNGS EVERY 6 HOURS AS NEEDED FOR WHEEZING OR SHORTNESS OF BREATH   amLODipine (NORVASC) 5 MG tablet 5 mg daily x 1 week in the am.   apixaban (ELIQUIS) 5 MG TABS tablet Take 1 tablet (5 mg total) by mouth 2 (two) times daily.   Cholecalciferol (VITAMIN D3) 1000 units CAPS Take 1,000 Units by mouth daily.   Co-Enzyme Q10 200 MG CAPS Take 1 tablet by mouth. Taking 1 every other day   escitalopram (LEXAPRO) 10 MG tablet Take 1 tablet (10 mg total) by mouth daily.   furosemide (LASIX) 20 MG tablet TAKE 1 TABLET BY MOUTH ONCE DAILY AS NEEDED FLUID RETENTION   hyoscyamine (LEVBID) 0.375 MG 12 hr tablet TAKE 1 TABLET BY MOUTH ONCE DAILY   losartan (COZAAR) 100 MG tablet TAKE 1 TABLET BY MOUTH ONCE DAILY   metoprolol tartrate (LOPRESSOR) 25 MG tablet Take 1 tablet (25 mg total) by mouth 2 (two) times daily.   potassium chloride SA (KLOR-CON) 20 MEQ tablet TAKE 1 TABLET BY MOUTH ONCE DAILY   psyllium (METAMUCIL) 58.6 % packet Take 1 packet by mouth daily.   rosuvastatin (CRESTOR) 20 MG tablet TAKE 1 TABLET BY MOUTH AT BEDTIME   No Known Allergies No results found for this or any previous visit (from the past 2160 hour(s)). Objective  Body mass index is 24.48 kg/m. Wt Readings from Last 3 Encounters:  11/26/20 140 lb 6.4 oz (63.7 kg)  10/04/20 140 lb (63.5 kg)  09/15/20 140 lb (63.5 kg)   Temp Readings from Last 3 Encounters:  11/26/20 98 F (36.7 C) (Oral)  10/04/20 98.1 F (36.7 C) (Oral)  08/26/20 98 F (36.7 C) (Oral)   BP Readings from Last 3 Encounters:  11/26/20 124/80  10/04/20 122/70  09/15/20 128/71   Pulse Readings from Last 3 Encounters:  11/26/20 96  10/04/20 (!) 58  08/26/20 (!) 53    Physical Exam Vitals and nursing note reviewed.  Constitutional:      Appearance: Normal appearance. She is well-developed and well-groomed.  HENT:     Head: Normocephalic and atraumatic.  Eyes:     Conjunctiva/sclera: Conjunctivae  normal.     Pupils: Pupils are equal, round, and reactive to light.  Cardiovascular:     Rate and Rhythm: Tachycardia present. Rhythm irregular.     Heart sounds: Normal heart sounds. No murmur heard. Skin:    General: Skin is warm and dry.  Neurological:     General: No focal deficit present.     Mental Status: She is alert and oriented to person, place, and time. Mental status is at baseline.     Gait: Gait normal.  Psychiatric:        Attention and Perception: Attention and perception normal.        Mood and Affect: Mood and affect normal.        Speech: Speech normal.        Behavior: Behavior normal. Behavior is cooperative.        Thought Content: Thought content normal.        Cognition and Memory: Cognition and memory normal.        Judgment: Judgment normal.    Assessment  Plan  Atrial fibrillation, with RVR on EKG today and recent chest pain and hypotension- Plan: Comprehensive metabolic panel, CBC w/Diff, Magnesium, Troponin I (High Sensitivity), DG Chest 2 View Contacted [facility] has appt 2:40 pm today Ekg  ? If needs DCCV per cards  Hypotension,? If related to unstable Afib with RVR  Tx per cards  Acute cystitis without hematuria - Plan: Urinalysis, Routine w reflex microscopic, Urine Culture  Cough - Plan: DG Chest 2 View   Provider: Dr. Olivia Mackie McLean-Scocuzza-Internal Medicine

## 2020-11-26 NOTE — H&P (View-Only) (Signed)
Cardiology Office Note:    Date:  11/26/2020   ID:  [name], DOB [birth year], MRN [id number]  PCP:  Whitney Mc, MD   Whitney Molina  Cardiologist:  [name], MD  Advanced Practice Provider:  No care team member to display Electrophysiologist:  None       Referring MD: Whitney Mc, MD   Chief Complaint  Patient presents with   Other    Afib RVR. Meds reviewed verbally with patient.    History of Present Illness:    Whitney Molina is a 80 y.o. female with a hx of hypertension, hyperlipidemia, atrial fibrillation who presents due to fatigue.  Patient has felt out of breath and fatigued with occasional palpitations over the past week.  Presented to primary care provider today due to symptoms.  EKG showed atrial fibrillation with heart rate 106.  Denies chest pain, endorses fatigue.  Has not missed a dose of Eliquis over the past 2 months.  Takes her Lopressor as prescribed.  Checked blood pressure at home, systolic was in the 70J.  Prior notes Echocardiogram 10/2834 normal systolic function, EF 60 to 65%, normal LA size.  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 left chest   OOPHORECTOMY     TONSILLECTOMY  1971   TUBAL LIGATION  1975   VAGINAL PROLAPSE REPAIR  July 2001   Pelvic Prolapse    Current Medications: Current Meds  Medication Sig   albuterol (VENTOLIN HFA) 108 (90 Base) MCG/ACT inhaler INHALE 1 PUFFS INTO THE LUNGS EVERY 6 HOURS AS NEEDED FOR WHEEZING OR SHORTNESS OF BREATH   apixaban (ELIQUIS) 5 MG TABS tablet Take 1 tablet (5 mg total) by mouth 2 (two) times daily.   Cholecalciferol (VITAMIN D3) 1000 units CAPS Take 1,000 Units by mouth daily.   Co-Enzyme Q10 200 MG CAPS Take 1 tablet by mouth. Taking 1 every other day   escitalopram (LEXAPRO) 10 MG tablet Take 1 tablet (10 mg total) by mouth daily.   furosemide (LASIX) 20 MG tablet TAKE 1 TABLET BY MOUTH ONCE DAILY AS NEEDED FLUID RETENTION   hyoscyamine (LEVBID) 0.375 MG 12 hr tablet TAKE 1 TABLET BY MOUTH ONCE DAILY   losartan (COZAAR) 100 MG tablet TAKE 1 TABLET BY MOUTH ONCE DAILY   potassium chloride SA (KLOR-CON) 20 MEQ tablet TAKE 1 TABLET BY MOUTH ONCE DAILY   psyllium (METAMUCIL) 58.6 % packet Take 1 packet by mouth daily.  rosuvastatin (CRESTOR) 20 MG tablet TAKE 1 TABLET BY MOUTH AT BEDTIME   [DISCONTINUED] amLODipine (NORVASC) 5 MG tablet 5 mg daily x 1 week in the am.   [DISCONTINUED] metoprolol tartrate (LOPRESSOR) 25 MG tablet Take 1 tablet (25 mg total) by mouth 2 (two) times daily.     Allergies:   Patient has no known allergies.   Social History   Socioeconomic History   Marital status: Married    Spouse name: Not on file   Number of children: Not on file   Years of education: Not on file   Highest education level: Not on file  Occupational History   Not on file  Tobacco Use   Smoking status: Never   Smokeless tobacco: Never  Vaping Use   Vaping Use: Never used  Substance and Sexual Activity   Alcohol use: No   Drug use: No   Sexual activity: Yes  Other Topics Concern   Not on file   Social History Narrative   Not on file   Social Determinants of Health   Financial Resource Strain: Low Risk    Difficulty of Paying Living Expenses: Not hard at all  Food Insecurity: No Food Insecurity   Worried About Charity fundraiser in the Last Year: Never true   [place] in the Last Year: Never true  Transportation Needs: No Transportation Needs   Lack of Transportation (Medical): No   Lack of Transportation (Non-Medical): No  Physical Activity: Insufficiently Active   Days of Exercise per Week: 4 days   Minutes of Exercise per Session: 20 min  Stress: No Stress Concern Present   Feeling of Stress : Not at all  Social Connections: Unknown   Frequency of Communication with Friends and Family: Not on file   Frequency of Social Gatherings with Friends and Family: Not on file   Attends Religious Services: Not on Electrical engineer or Organizations: Not on file   Attends Archivist Meetings: Not on file   Marital Status: Married     Family History: The patient's family history includes Bipolar disorder in her son; Breast cancer (age of onset: 80) in her paternal aunt; Breast cancer (age of onset: 43) in her mother; Cancer in her brother; Dementia in her father; Heart attack in her brother; Stroke in her brother, father, and mother.  ROS:   Please see the history of present illness.     All other systems reviewed and are negative.  EKGs/Labs/Other Studies Reviewed:    The following studies were reviewed today:   EKG:  EKG not  ordered today.    EKG from PCPs office obtained today showing atrial fibrillation, heart rate 106.  Recent Labs: 04/20/2020: TSH 1.19 11/26/2020: ALT 18; BUN 16; Creatinine, Ser 1.07; Hemoglobin 12.5; Magnesium 2.2; Platelets 222.0; Potassium 3.8; Sodium 140  Recent Lipid Panel    Component Value Date/Time   CHOL 106 04/20/2020 0953   TRIG 151.0 (H) 04/20/2020 0953   HDL 31.40 (L) 04/20/2020 0953   CHOLHDL 3  04/20/2020 0953   VLDL 30.2 04/20/2020 0953   LDLCALC 45 04/20/2020 0953   LDLDIRECT 145.0 04/01/2018 1116     Risk Assessment/Calculations:      Physical Exam:    VS:  BP 110/60 (BP Location: Left Arm, Patient Position: Sitting, Cuff Size: Normal)   Pulse (!) 106   Ht 5' 3.5" (1.613 m)   Wt 141 lb (64 kg)   SpO2 98%  BMI 24.59 kg/m     Wt Readings from Last 3 Encounters:  11/26/20 141 lb (64 kg)  11/26/20 140 lb 6.4 oz (63.7 kg)  10/04/20 140 lb (63.5 kg)     GEN:  Well nourished, well developed in no acute distress HEENT: Normal NECK: No JVD; No carotid bruits LYMPHATICS: No lymphadenopathy CARDIAC: Irregular irregular, no murmurs. RESPIRATORY:  Clear to auscultation without rales, wheezing or rhonchi  ABDOMEN: Soft, non-tender, non-distended MUSCULOSKELETAL:  No edema; No deformity  SKIN: Warm and dry NEUROLOGIC:  Alert and oriented x 3 PSYCHIATRIC:  Normal affect   ASSESSMENT:    1. Atrial fibrillation, unspecified type ([place])   2. Primary hypertension   3. Pure hypercholesterolemia     PLAN:    In order of problems listed above:  History of paroxysmal A. fib, A. fib noted on EKG today, heart rate 106.  CHA2DS2-VASc score 4 (age, htn, gender).  Increase Lopressor 25 mg 3 times daily, Eliquis 5 mg twice daily.  Plan for DC cardioversion in 4 days.  Last echocardiogram showed normal systolic function, EF 60 to 25%, grade 2 diastolic dysfunction, normal LA size. Hypertension, BP controlled..  Lopressor as above, stop Norvasc, continue losartan. Hyperlipidemia, continue statin.   Follow-up in 1 month  Shared Decision Making/Informed Consent The risks (stroke, cardiac arrhythmias rarely resulting in the need for a temporary or permanent pacemaker, skin irritation or burns and complications associated with conscious sedation including aspiration, arrhythmia, respiratory failure and death), benefits (restoration of normal sinus rhythm) and alternatives of a  direct current cardioversion were explained in detail to Ms. Saraceno and she agrees to proceed.     Medication Adjustments/Labs and Tests Ordered: Current medicines are reviewed at length with the patient today.  Concerns regarding medicines are outlined above.  No orders of the defined types were placed in this encounter.  Meds ordered this encounter  Medications   metoprolol tartrate (LOPRESSOR) 25 MG tablet    Sig: Take 1 tablet (25 mg total) by mouth in the morning, at noon, and at bedtime.    Dispense:  90 tablet    Refill:  3     Patient Instructions  Medication Instructions:   Your physician has recommended you make the following change in your medication:    STOP taking your Amlodipine.   2.    INCREASE your Metoprolol Tartrate (Lopressor) to 25 MG three times a day.   *If you need a refill on your cardiac medications before your next appointment, please call your pharmacy*   Lab Work: None ordered If you have labs (blood work) drawn today and your tests are completely normal, you will receive your results only by: [place] (if you have MyChart) OR A paper copy in the mail If you have any lab test that is abnormal or we need to change your treatment, we will call you to review the results.   Testing/Procedures:  You are scheduled for a Cardioversion on __Tues 7/19/22______________ with Dr.__Agbor-Etang_________ Please arrive at the [place] [facility] at ___0645______ a.m. on the day of your procedure.  DIET INSTRUCTIONS:  Nothing to eat or drink after midnight except your medications with a sip of water.    Labs: __already drawn________________  Medications:  YOU MAY TAKE ALL of your remaining medications with a small amount of water.  Must have a responsible person to drive you home.  Bring a current list of your medications and current insurance cards.    If you have  any questions after you get home, please call the office at [phone]     Follow-Up: At [facility], you and your health needs are our priority.  As part of our continuing mission to provide you with exceptional heart care, we have created designated Provider Care Teams.  These Care Teams include your primary Cardiologist (physician) and Advanced Practice Providers (APPs -  Physician Assistants and Nurse Practitioners) who all work together to provide you with the care you need, when you need it.  We recommend signing up for the patient portal called "MyChart".  Sign up information is provided on this After Visit Summary.  MyChart is used to connect with patients for Virtual Visits (Telemedicine).  Patients are able to view lab/test results, encounter notes, upcoming appointments, etc.  Non-urgent messages can be sent to your provider as well.   To learn more about what you can do with MyChart, go to [email or web address].    Your next appointment:   1 month(s)  The format for your next appointment:   In Person  Provider:   You may see [name], MD or one of the following Advanced Practice Providers on your designated Care Team:   Murray Hodgkins, NP Christell Faith, PA-C Marrianne Mood, PA-C Cadence [place], [place]   Other Instructions    Signed, [name], MD  11/26/2020 4:26 PM    [place]

## 2020-11-26 NOTE — Patient Instructions (Signed)
Atrial Fibrillation  Atrial fibrillation is a type of irregular or rapid heartbeat (arrhythmia). In atrial fibrillation, the top part of the heart (atria) beats in an irregular pattern. This makes the heart unable to pump bloodnormally and effectively. The goal of treatment is to prevent blood clots from forming, control your heart rate, or restore your heartbeat to a normal rhythm. If this condition is not treated, it can cause serious problems, such as a weakened heart muscle (cardiomyopathy) or a stroke. What are the causes? This condition is often caused by medical conditions that damage the heart's electrical system. These include: High blood pressure (hypertension). This is the most common cause. Certain heart problems or conditions, such as heart failure, coronary artery disease, heart valve problems, or heart surgery. Diabetes. Overactive thyroid (hyperthyroidism). Obesity. Chronic kidney disease. In some cases, the cause of this condition is not known. What increases the risk? This condition is more likely to develop in: Older people. People who smoke. Athletes who do endurance exercise. People who have a family history of atrial fibrillation. Men. People who use drugs. People who drink a lot of alcohol. People who have lung conditions, such as emphysema, pneumonia, or COPD. People who have obstructive sleep apnea. What are the signs or symptoms? Symptoms of this condition include: A feeling that your heart is racing or beating irregularly. Discomfort or pain in your chest. Shortness of breath. Sudden light-headedness or weakness. Tiring easily during exercise or activity. Fatigue. Syncope (fainting). Sweating. In some cases, there are no symptoms. How is this diagnosed? Your health care provider may detect atrial fibrillation when taking your pulse. If detected, this condition may be diagnosed with: An electrocardiogram (ECG) to check electrical signals of the  heart. An ambulatory cardiac monitor to record your heart's activity for a few days. A transthoracic echocardiogram (TTE) to create pictures of your heart. A transesophageal echocardiogram (TEE) to create even closer pictures of your heart. A stress test to check your blood supply while you exercise. Imaging tests, such as a CT scan or chest X-ray. Blood tests. How is this treated? Treatment depends on underlying conditions and how you feel when you experience atrial fibrillation. This condition may be treated with: Medicines to prevent blood clots or to treat heart rate or heart rhythm problems. Electrical cardioversion to reset the heart's rhythm. A pacemaker to correct abnormal heart rhythm. Ablation to remove the heart tissue that sends abnormal signals. Left atrial appendage closure to seal the area where blood clots can form. In some cases, underlying conditions will be treated. Follow these instructions at home: Medicines Take over-the counter and prescription medicines only as told by your health care provider. Do not take any new medicines without talking to your health care provider. If you are taking blood thinners: Talk with your health care provider before you take any medicines that contain aspirin or NSAIDs, such as ibuprofen. These medicines increase your risk for dangerous bleeding. Take your medicine exactly as told, at the same time every day. Avoid activities that could cause injury or bruising, and follow instructions about how to prevent falls. Wear a medical alert bracelet or carry a card that lists what medicines you take. Lifestyle     Do not use any products that contain nicotine or tobacco, such as cigarettes, e-cigarettes, and chewing tobacco. If you need help quitting, ask your health care provider. Eat heart-healthy foods. Talk with a dietitian to make an eating plan that is right for you. Exercise regularly as told by   your health care provider. Do not  drink alcohol. Lose weight if you are overweight. Do not use drugs, including cannabis. General instructions If you have obstructive sleep apnea, manage your condition as told by your health care provider. Do not use diet pills unless your health care provider approves. Diet pills can make heart problems worse. Keep all follow-up visits as told by your health care provider. This is important. Contact a health care provider if you: Notice a change in the rate, rhythm, or strength of your heartbeat. Are taking a blood thinner and you notice more bruising. Tire more easily when you exercise or do heavy work. Have a sudden change in weight. Get help right away if you have:  Chest pain, abdominal pain, sweating, or weakness. Trouble breathing. Side effects of blood thinners, such as blood in your vomit, stool, or urine, or bleeding that cannot stop. Any symptoms of a stroke. "BE FAST" is an easy way to remember the main warning signs of a stroke: B - Balance. Signs are dizziness, sudden trouble walking, or loss of balance. E - Eyes. Signs are trouble seeing or a sudden change in vision. F - Face. Signs are sudden weakness or numbness of the face, or the face or eyelid drooping on one side. A - Arms. Signs are weakness or numbness in an arm. This happens suddenly and usually on one side of the body. S - Speech. Signs are sudden trouble speaking, slurred speech, or trouble understanding what people say. T - Time. Time to call emergency services. Write down what time symptoms started. Other signs of a stroke, such as: A sudden, severe headache with no known cause. Nausea or vomiting. Seizure. These symptoms may represent a serious problem that is an emergency. Do not wait to see if the symptoms will go away. Get medical help right away. Call your local emergency services (911 in the [place] Do not drive yourself to the hospital. Summary Atrial fibrillation is a type of irregular or rapid  heartbeat (arrhythmia). Symptoms include a feeling that your heart is beating fast or irregularly. You may be given medicines to prevent blood clots or to treat heart rate or heart rhythm problems. Get help right away if you have signs or symptoms of a stroke. Get help right away if you cannot catch your breath or have chest pain or pressure. This information is not intended to replace advice given to you by your health care provider. Make sure you discuss any questions you have with your healthcare provider. Document Revised: 10/23/2018 Document Reviewed: 10/23/2018 Elsevier Patient Education  2022 Elsevier Inc.  

## 2020-11-26 NOTE — Patient Instructions (Signed)
Medication Instructions:   Your physician has recommended you make the following change in your medication:    STOP taking your Amlodipine.   2.    INCREASE your Metoprolol Tartrate (Lopressor) to 25 MG three times a day.   *If you need a refill on your cardiac medications before your next appointment, please call your pharmacy*   Lab Work: None ordered If you have labs (blood work) drawn today and your tests are completely normal, you will receive your results only by: [place] (if you have MyChart) OR A paper copy in the mail If you have any lab test that is abnormal or we need to change your treatment, we will call you to review the results.   Testing/Procedures:  You are scheduled for a Cardioversion on __Tues 7/19/22______________ with Dr.__Agbor-Etang_________ Please arrive at the [place] [facility] at ___0645______ a.m. on the day of your procedure.  DIET INSTRUCTIONS:  Nothing to eat or drink after midnight except your medications with a sip of water.    Labs: __already drawn________________  Medications:  YOU MAY TAKE ALL of your remaining medications with a small amount of water.  Must have a responsible person to drive you home.  Bring a current list of your medications and current insurance cards.    If you have any questions after you get home, please call the office at [phone]    Follow-Up: At [facility], you and your health needs are our priority.  As part of our continuing mission to provide you with exceptional heart care, we have created designated Provider Care Teams.  These Care Teams include your primary Cardiologist (physician) and Advanced Practice Providers (APPs -  Physician Assistants and Nurse Practitioners) who all work together to provide you with the care you need, when you need it.  We recommend signing up for the patient portal called "MyChart".  Sign up information is provided on this After Visit Summary.  MyChart is used to  connect with patients for Virtual Visits (Telemedicine).  Patients are able to view lab/test results, encounter notes, upcoming appointments, etc.  Non-urgent messages can be sent to your provider as well.   To learn more about what you can do with MyChart, go to [email or web address].    Your next appointment:   1 month(s)  The format for your next appointment:   In Person  Provider:   You may see [name], MD or one of the following Advanced Practice Providers on your designated Care Team:   Murray Hodgkins, NP Christell Faith, PA-C Marrianne Mood, PA-C Cadence Kathlen [place], [place]   Other Instructions

## 2020-11-28 LAB — URINALYSIS, ROUTINE W REFLEX MICROSCOPIC
Bacteria, UA: NONE SEEN /HPF
Bilirubin Urine: NEGATIVE
Glucose, UA: NEGATIVE
Hgb urine dipstick: NEGATIVE
Hyaline Cast: NONE SEEN /LPF
Ketones, ur: NEGATIVE
Nitrite: NEGATIVE
RBC / HPF: NONE SEEN /HPF (ref 0–2)
Specific Gravity, Urine: 1.015 (ref 1.001–1.035)
pH: 7 (ref 5.0–8.0)

## 2020-11-28 LAB — URINE CULTURE
MICRO NUMBER:: 12124971
SPECIMEN QUALITY:: ADEQUATE

## 2020-11-28 LAB — MICROSCOPIC MESSAGE

## 2020-11-30 ENCOUNTER — Ambulatory Visit
Admission: RE | Admit: 2020-11-30 | Discharge: 2020-11-30 | Disposition: A | Discharge status: Home / Self Care | Payer: Medicare Other | Attending: Cardiology | Admitting: Cardiology

## 2020-11-30 ENCOUNTER — Encounter: Payer: Self-pay | Admitting: Cardiology

## 2020-11-30 ENCOUNTER — Ambulatory Visit: Payer: Medicare Other | Admitting: Anesthesiology

## 2020-11-30 ENCOUNTER — Encounter
Admission: RE | Disposition: A | Discharge status: Home / Self Care | Payer: Self-pay | Source: Home / Self Care | Attending: Cardiology

## 2020-11-30 DIAGNOSIS — Z8249 Family history of ischemic heart disease and other diseases of the circulatory system: Secondary | ICD-10-CM | POA: Diagnosis not present

## 2020-11-30 DIAGNOSIS — I10 Essential (primary) hypertension: Secondary | ICD-10-CM | POA: Insufficient documentation

## 2020-11-30 DIAGNOSIS — Z823 Family history of stroke: Secondary | ICD-10-CM | POA: Insufficient documentation

## 2020-11-30 DIAGNOSIS — Z7951 Long term (current) use of inhaled steroids: Secondary | ICD-10-CM | POA: Insufficient documentation

## 2020-11-30 DIAGNOSIS — Z809 Family history of malignant neoplasm, unspecified: Secondary | ICD-10-CM | POA: Diagnosis not present

## 2020-11-30 DIAGNOSIS — E7800 Pure hypercholesterolemia, unspecified: Secondary | ICD-10-CM | POA: Insufficient documentation

## 2020-11-30 DIAGNOSIS — I4892 Unspecified atrial flutter: Secondary | ICD-10-CM | POA: Insufficient documentation

## 2020-11-30 DIAGNOSIS — Z803 Family history of malignant neoplasm of breast: Secondary | ICD-10-CM | POA: Insufficient documentation

## 2020-11-30 DIAGNOSIS — I447 Left bundle-branch block, unspecified: Secondary | ICD-10-CM | POA: Diagnosis not present

## 2020-11-30 DIAGNOSIS — I480 Paroxysmal atrial fibrillation: Secondary | ICD-10-CM | POA: Diagnosis not present

## 2020-11-30 DIAGNOSIS — E785 Hyperlipidemia, unspecified: Secondary | ICD-10-CM | POA: Diagnosis not present

## 2020-11-30 DIAGNOSIS — Z8572 Personal history of non-Hodgkin lymphomas: Secondary | ICD-10-CM | POA: Diagnosis not present

## 2020-11-30 DIAGNOSIS — Z7901 Long term (current) use of anticoagulants: Secondary | ICD-10-CM | POA: Diagnosis not present

## 2020-11-30 DIAGNOSIS — I4819 Other persistent atrial fibrillation: Secondary | ICD-10-CM

## 2020-11-30 DIAGNOSIS — I4891 Unspecified atrial fibrillation: Secondary | ICD-10-CM | POA: Diagnosis not present

## 2020-11-30 DIAGNOSIS — Z79899 Other long term (current) drug therapy: Secondary | ICD-10-CM | POA: Insufficient documentation

## 2020-11-30 HISTORY — PX: CARDIOVERSION: SHX1299

## 2020-11-30 SURGERY — CARDIOVERSION
Anesthesia: General

## 2020-11-30 MED ORDER — METOPROLOL TARTRATE 25 MG PO TABS: 25.0000 mg | ORAL_TABLET | Freq: Two times a day (BID) | ORAL | 0 refills | Status: DC

## 2020-11-30 MED ORDER — SODIUM CHLORIDE 0.9 % IV SOLN: INTRAVENOUS | Status: DC | PRN

## 2020-11-30 MED ORDER — PROPOFOL 10 MG/ML IV BOLUS
INTRAVENOUS | Status: DC | PRN
  Administered 2020-11-30: 10 mg via INTRAVENOUS
  Administered 2020-11-30: 50 mg via INTRAVENOUS

## 2020-11-30 MED ORDER — ONDANSETRON HCL 4 MG/2ML IJ SOLN: 4.0000 mg | Freq: Once | INTRAMUSCULAR | Status: DC | PRN

## 2020-11-30 NOTE — Transfer of Care (Signed)
Immediate Anesthesia Transfer of Care Note  Patient: Whitney Molina  Procedure(s) Performed: CARDIOVERSION  Patient Location: SR 2  Anesthesia Type:General  Level of Consciousness: awake and alert   Airway & Oxygen Therapy: Patient Spontanous Breathing and Patient connected to nasal cannula oxygen  Post-op Assessment: Report given to RN and Post -op Vital signs reviewed and stable  Post vital signs: Reviewed and stable  Last Vitals:  Vitals Value Taken Time  BP 116/69 11/30/20 0810  Temp    Pulse 53 11/30/20 0810  Resp 20 11/30/20 0810  SpO2 87 % 11/30/20 0810  Vitals shown include unvalidated device data.  Last Pain:  Vitals:   11/30/20 0810  TempSrc:   PainSc: 0-No pain         Complications: No notable events documented.

## 2020-11-30 NOTE — Anesthesia Postprocedure Evaluation (Signed)
Anesthesia Post Note  Patient: Whitney Molina  Procedure(s) Performed: CARDIOVERSION  Patient location during evaluation: [facility] Recovery Anesthesia Type: General Level of consciousness: awake and alert Pain management: pain level controlled Vital Signs Assessment: post-procedure vital signs reviewed and stable Respiratory status: spontaneous breathing, nonlabored ventilation, respiratory function stable and patient connected to nasal cannula oxygen Cardiovascular status: blood pressure returned to baseline and stable Postop Assessment: no apparent nausea or vomiting Anesthetic complications: no   No notable events documented.   Last Vitals:  Vitals:   11/30/20 0810 11/30/20 0815  BP: 116/69 124/80  Pulse: (!) 51 (!) 53  Resp: 17 18  Temp:    SpO2: 97% 94%    Last Pain:  Vitals:   11/30/20 0810  TempSrc:   PainSc: 0-No pain                 Arita Miss

## 2020-11-30 NOTE — Interval H&P Note (Signed)
History and Physical Interval Note:  11/30/2020 8:03 AM  Whitney Molina  has presented today for surgery, with the diagnosis of atrial fibrillation.  The various methods of treatment have been discussed with the patient and family. After consideration of risks, benefits and other options for treatment, the patient has consented to  Procedure(s): CARDIOVERSION (N/A) as a surgical intervention.  The patient's history has been reviewed, patient examined, no change in status, stable for surgery.  I have reviewed the patient's chart and labs.  Questions were answered to the patient's satisfaction.     Aaron Edelman Agbor-Etang

## 2020-11-30 NOTE — Procedures (Signed)
Cardioversion procedure note For atrial fibrillation.  Procedure Details:  Consent: Risks of procedure as well as the alternatives and risks of each were explained to the (patient/caregiver).  Consent for procedure obtained.  Time Out: Verified patient identification, verified procedure, site/side was marked, verified correct patient position, special equipment/implants available, medications/allergies/relevent history reviewed, required imaging and test results available.  Performed  Patient placed on cardiac monitor, pulse oximetry, supplemental oxygen as necessary.   Sedation given: propofol IV, per anesthesia team  Pacer pads placed anterior and posterior chest.   Cardioverted 1 time(s).   Cardioverted at  [place]. Synchronized biphasic Converted to NSR   Evaluation: Findings: Post procedure EKG shows: NSR Complications: None Patient did tolerate procedure well.  Time Spent Directly with the Patient:  35 minutes   [name], M.D.

## 2020-11-30 NOTE — Anesthesia Preprocedure Evaluation (Addendum)
Anesthesia Evaluation  Patient identified by MRN, date of birth, ID band Patient awake    Reviewed: Allergy & Precautions, NPO status , Patient's Chart, lab work & pertinent test results  History of Anesthesia Complications Negative for: history of anesthetic complications  Airway Mallampati: II  TM Distance: >3 FB Neck ROM: Full    Dental no notable dental hx. (+) Teeth Intact   Pulmonary neg sleep apnea, COPD,  COPD inhaler, Patient abstained from smoking.Not current smoker,    Pulmonary exam normal breath sounds clear to auscultation       Cardiovascular Exercise Tolerance: Good METShypertension, +CHF  (-) CAD and (-) Past MI + dysrhythmias Atrial Fibrillation + Valvular Problems/Murmurs MR  Rhythm:Irregular Rate:Normal - Systolic murmurs TTE 0/6237: 1. Left ventricular ejection fraction, by estimation, is 60 to 65%. The  left ventricle has normal function. The left ventricle has no regional  wall motion abnormalities. Left ventricular diastolic parameters are  consistent with Grade II diastolic  dysfunction (pseudonormalization).  2. Right ventricular systolic function is normal. The right ventricular  size is normal. There is normal pulmonary artery systolic pressure. The  estimated right ventricular systolic pressure is 62.8 mmHg.  3. The mitral valve is normal in structure. Moderate mitral valve  regurgitation. No evidence of mitral stenosis.  4. Tricuspid valve regurgitation is mild to moderate.  5. Aortic valve regurgitation is mild.  6. Rhythm is normal sinus    Neuro/Psych negative neurological ROS  negative psych ROS   GI/Hepatic GERD  Controlled,(+)     (-) substance abuse  ,   Endo/Other  neg diabetes  Renal/GU negative Renal ROS     Musculoskeletal   Abdominal   Peds  Hematology   Anesthesia Other Findings Past Medical History: No date: Basal cell carcinoma of nose No date:  Bronchitis No date: GERD (gastroesophageal reflux disease) No date: Heart murmur No date: Hemorrhoids No date: History of basal cell carcinoma     Comment:  right upper lip, right ant shoulder No date: History of colon polyps 12/05/2001: History of dysplastic nevus     Comment:  left paraspinal upper back, upper back spinal No date: History of kidney stones 2016: History of mammogram 02/07/2005: History of squamous cell carcinoma     Comment:  left ant chest No date: Hyperlipidemia No date: Hypertension No date: Non Hodgkin's lymphoma ([place]) 2004: 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)     Comment:  removed at [facility]  Reproductive/Obstetrics                            Anesthesia Physical Anesthesia Plan  ASA: 3  Anesthesia Plan: General   Post-op Pain Management:    Induction: Intravenous  PONV Risk Score and Plan: 3 and Ondansetron, Propofol infusion and TIVA  Airway Management Planned: Nasal Cannula  Additional Equipment: None  Intra-op Plan:   Post-operative Plan:   Informed Consent: I have reviewed the patients History and Physical, chart, labs and discussed the procedure including the risks, benefits and alternatives for the proposed anesthesia with the patient or authorized representative who has indicated his/her understanding and acceptance.     Dental advisory given  Plan Discussed with: CRNA and Surgeon  Anesthesia Plan Comments: (Discussed risks of anesthesia with patient, including possibility of difficulty with spontaneous ventilation under anesthesia necessitating airway intervention, PONV, and rare risks such as cardiac or respiratory or neurological events. Patient understands.)  Anesthesia Quick Evaluation  

## 2020-12-01 ENCOUNTER — Other Ambulatory Visit: Payer: Self-pay | Admitting: Internal Medicine

## 2020-12-01 ENCOUNTER — Encounter: Payer: Self-pay | Admitting: Adult Health

## 2020-12-01 NOTE — Telephone Encounter (Signed)
Patient notified medication called to pharmacy.

## 2020-12-01 NOTE — Telephone Encounter (Signed)
  potassium chloride SA (KLOR-CON) 20 MEQ tablet 90  losartan (COZAAR) 100 MG tablet 90  Send to [facility]

## 2020-12-01 NOTE — Telephone Encounter (Signed)
Pt called to check on these medications and states that she needs by the end of today

## 2020-12-06 ENCOUNTER — Telehealth: Payer: Self-pay

## 2020-12-06 MED ORDER — APIXABAN 5 MG PO TABS: 5.0000 mg | ORAL_TABLET | Freq: Two times a day (BID) | ORAL | 5 refills | Status: DC

## 2020-12-06 NOTE — Telephone Encounter (Signed)
Prescription refill request for Eliquis received. Indication: Atrial Fib Last office visit: 11/26/20 [place] MD Scr: 1.07 on 11/26/20 Age: 80 Weight: 64kg  Based on above findings Eliquis '5mg'$  twice daily is the appropriate dose.  Refill approved.

## 2020-12-06 NOTE — Telephone Encounter (Signed)
*  STAT* If patient is at the pharmacy, call can be transferred to refill team.   1. Which medications need to be refilled? (please list name of each medication and dose if known) Eliquis 5 mg tablets   2. Which pharmacy/location (including street and city if local pharmacy) is medication to be sent to? [facility]  3. Do they need a 30 day or 90 day supply? [place]

## 2020-12-28 ENCOUNTER — Ambulatory Visit: Payer: Medicare Other | Admitting: Adult Health

## 2020-12-28 ENCOUNTER — Encounter: Payer: Self-pay | Admitting: Internal Medicine

## 2020-12-28 ENCOUNTER — Other Ambulatory Visit: Payer: Self-pay | Admitting: Internal Medicine

## 2020-12-28 ENCOUNTER — Ambulatory Visit (INDEPENDENT_AMBULATORY_CARE_PROVIDER_SITE_OTHER): Payer: Medicare Other | Admitting: Internal Medicine

## 2020-12-28 ENCOUNTER — Other Ambulatory Visit: Payer: Self-pay

## 2020-12-28 VITALS — BP 162/90 | HR 60 | Temp 95.8°F | Resp 15 | Ht 63.5 in | Wt 138.0 lb

## 2020-12-28 DIAGNOSIS — D6869 Other thrombophilia: Secondary | ICD-10-CM

## 2020-12-28 DIAGNOSIS — N1832 Chronic kidney disease, stage 3b: Secondary | ICD-10-CM

## 2020-12-28 DIAGNOSIS — E785 Hyperlipidemia, unspecified: Secondary | ICD-10-CM | POA: Diagnosis not present

## 2020-12-28 DIAGNOSIS — I4819 Other persistent atrial fibrillation: Secondary | ICD-10-CM

## 2020-12-28 DIAGNOSIS — I129 Hypertensive chronic kidney disease with stage 1 through stage 4 chronic kidney disease, or unspecified chronic kidney disease: Secondary | ICD-10-CM

## 2020-12-28 DIAGNOSIS — N183 Chronic kidney disease, stage 3 unspecified: Secondary | ICD-10-CM | POA: Insufficient documentation

## 2020-12-28 NOTE — Patient Instructions (Addendum)
Consider suspending the eliquis for 3 days prior to your tooth extraction to avoid excessive bleeding    Suspend the furosemide (lasix)  so we can assess your kidney function without it.  You should not need lasix anymore: it does NOT treat incontinence or overactive bladder (it makes it worse)    Return for labs ;  you may not need to refill potassium any more   We will schedule the RN visit to check the blood pressure machine

## 2020-12-28 NOTE — Progress Notes (Signed)
Cardiology Office Note:    Date:  12/29/2020   ID:  Whitney Molina, DOB 1940-09-29, MRN [id number]  PCP:  [name], MD  [facility] Cardiologist:  [name], MD  [facility] Electrophysiologist:  None   Referring MD: [name], MD   Chief Complaint: 1 month follow-up post DCCV  History of Present Illness:    Whitney Molina is a 80 y.o. female with a hx of HTN, HLD, CKD stage 3, and paroxysmal afib on Eliquis who presents for cardioversion follow-up.   First seen 07/09/20 for afib, referred by PCP. EKG showed afib with rate 120s. Patient was started on Lopressor and Eliquis. Echo showed EF 60-65%, normal LA size.  Last seen 11/26/20 and EKG showed persistent afib with heart rate 106bpm. BP at home showed systolics in the [id number]. She was set up for cardioversion. She underwent successful cardioversion 11/30/20.   Today,  the patient reports she has been doing well since the cardioversion. Saw PCP and lasix and potassium was discontinued. Blood work next week by PCP. BP has been up. Yesterday was 162/90. BP at home generally 130-140s/70. She is feeling normal. No dizziness or lightheadedness. No chest pain. She had episode of shortness of breath yesterday, possible anxiety driven because she had a lot to do. She is fairly active at home, she has no anginal symptoms with this. No bleeding issues on Eliquis. She denies lower leg edema, orthopnea, pnd, lightheadedness and dizziness.   Past Medical History:  Diagnosis Date   Basal cell carcinoma of nose    Bronchitis    GERD (gastroesophageal reflux disease)    Heart murmur    Hemorrhoids    History of basal cell carcinoma    right upper lip, right ant shoulder   History of colon polyps    History of dysplastic nevus 12/05/2001   left paraspinal upper back, upper back spinal   History of kidney stones    History of mammogram 2016   History of squamous cell carcinoma 02/07/2005   left ant chest    Hyperlipidemia    Hypertension    Non Hodgkin's lymphoma ([place])    Primary squamous cell carcinoma of chest wall ([place]) 2004   removed at [place]    Past Surgical History:  Procedure Laterality Date   [place]  2010   CARDIOVERSION N/A 11/30/2020   Procedure: CARDIOVERSION;  Surgeon: [name], MD;  Location: [facility] ORS;  Service: Cardiovascular;  Laterality: N/A;   CHOLECYSTECTOMY  06-01-03   COLONOSCOPY  2003   LITHOTRIPSY  2005   local exicsion skin cancer  2004   squamous cell of chest wall. left chest   OOPHORECTOMY     TONSILLECTOMY  1971   TUBAL LIGATION  1975   VAGINAL PROLAPSE REPAIR  July 2001   Pelvic Prolapse    Current Medications: Current Meds  Medication Sig   albuterol (VENTOLIN HFA) 108 (90 Base) MCG/ACT inhaler INHALE 1 PUFFS INTO THE LUNGS EVERY 6 HOURS AS NEEDED FOR WHEEZING OR SHORTNESS OF BREATH   apixaban (ELIQUIS) 5 MG TABS tablet Take 1 tablet (5 mg total) by mouth 2 (two) times daily.   Cholecalciferol (VITAMIN D3) 1000 units CAPS Take 1,000 Units by mouth daily.   Co-Enzyme Q10 200 MG CAPS Take 1 tablet by mouth. Taking 1 every other day   escitalopram (LEXAPRO) 10 MG tablet Take 1 tablet (10 mg total) by mouth daily.   losartan (COZAAR)  100 MG tablet TAKE 1 TABLET BY MOUTH ONCE DAILY   metoprolol tartrate (LOPRESSOR) 25 MG tablet Take 1 tablet (25 mg total) by mouth 2 (two) times daily.   psyllium (METAMUCIL) 58.6 % packet Take 1 packet by mouth daily.   rosuvastatin (CRESTOR) 20 MG tablet TAKE 1 TABLET BY MOUTH AT BEDTIME   [DISCONTINUED] furosemide (LASIX) 20 MG tablet TAKE 1 TABLET BY MOUTH ONCE DAILY AS NEEDED FLUID RETENTION     Allergies:   Patient has no known allergies.   Social History   Socioeconomic History   Marital status: Married    Spouse name: Not on file   Number of children: Not on file   Years of education: Not on file   Highest education level: Not on file  Occupational History    Not on file  Tobacco Use   Smoking status: Never   Smokeless tobacco: Never  Vaping Use   Vaping Use: Never used  Substance and Sexual Activity   Alcohol use: No   Drug use: No   Sexual activity: Yes  Other Topics Concern   Not on file  Social History Narrative   Not on file   Social Determinants of Health   Financial Resource Strain: Low Risk    Difficulty of Paying Living Expenses: Not hard at all  Food Insecurity: No Food Insecurity   Worried About Charity fundraiser in the Last Year: Never true   [place] in the Last Year: Never true  Transportation Needs: No Transportation Needs   Lack of Transportation (Medical): No   Lack of Transportation (Non-Medical): No  Physical Activity: Insufficiently Active   Days of Exercise per Week: 4 days   Minutes of Exercise per Session: 20 min  Stress: No Stress Concern Present   Feeling of Stress : Not at all  Social Connections: Unknown   Frequency of Communication with Friends and Family: Not on file   Frequency of Social Gatherings with Friends and Family: Not on file   Attends Religious Services: Not on Electrical engineer or Organizations: Not on file   Attends Archivist Meetings: Not on file   Marital Status: Married     Family History: The patient's family history includes Bipolar disorder in her son; Breast cancer (age of onset: 25) in her paternal aunt; Breast cancer (age of onset: 64) in her mother; Cancer in her brother; Dementia in her father; Heart attack in her brother; Stroke in her brother, father, and mother.  ROS:   Please see the history of present illness.     All other systems reviewed and are negative.  EKGs/Labs/Other Studies Reviewed:    The following studies were reviewed today:  Echo 07/2020 1. Left ventricular ejection fraction, by estimation, is 60 to 65%. The  left ventricle has normal function. The left ventricle has no regional  wall motion abnormalities. Left  ventricular diastolic parameters are  consistent with Grade II diastolic  dysfunction (pseudonormalization).   2. Right ventricular systolic function is normal. The right ventricular  size is normal. There is normal pulmonary artery systolic pressure. The  estimated right ventricular systolic pressure is 0000000 mmHg.   3. The mitral valve is normal in structure. Moderate mitral valve  regurgitation. No evidence of mitral stenosis.   4. Tricuspid valve regurgitation is mild to moderate.   5. Aortic valve regurgitation is mild.   6. Rhythm is normal sinus   EKG:  EKG is  ordered today.  The ekg ordered today demonstrates SB, 56bpm, LAD, no ischemic changes  Recent Labs: 04/20/2020: TSH 1.19 11/26/2020: ALT 18; BUN 16; Creatinine, Ser 1.07; Hemoglobin 12.5; Magnesium 2.2; Platelets 222.0; Potassium 3.8; Sodium 140  Recent Lipid Panel    Component Value Date/Time   CHOL 106 04/20/2020 0953   TRIG 151.0 (H) 04/20/2020 0953   HDL 31.40 (L) 04/20/2020 0953   CHOLHDL 3 04/20/2020 0953   VLDL 30.2 04/20/2020 0953   LDLCALC 45 04/20/2020 0953   LDLDIRECT 145.0 04/01/2018 1116    Physical Exam:    VS:  BP (!) 148/74 (BP Location: Left Arm, Patient Position: Sitting, Cuff Size: Normal)   Pulse (!) 56   Ht '5\' 3"'$  (1.6 m)   Wt 137 lb 8 oz (62.4 kg)   SpO2 96%   BMI 24.36 kg/m     Wt Readings from Last 3 Encounters:  12/29/20 137 lb 8 oz (62.4 kg)  12/28/20 138 lb (62.6 kg)  11/30/20 141 lb 1.5 oz (64 kg)     GEN:  Well nourished, well developed in no acute distress HEENT: Normal NECK: No JVD; No carotid bruits LYMPHATICS: No lymphadenopathy CARDIAC: SB, RR, no murmurs, rubs, gallops RESPIRATORY:  Clear to auscultation without rales, wheezing or rhonchi  ABDOMEN: Soft, non-tender, non-distended MUSCULOSKELETAL:  No edema; No deformity  SKIN: Warm and dry NEUROLOGIC:  Alert and oriented x 3 PSYCHIATRIC:  Normal affect   ASSESSMENT:    1. Paroxysmal atrial fibrillation (HCC)    2. Essential hypertension   3. Hyperlipidemia, mixed   4. Chronic diastolic heart failure (HCC)    PLAN:    In order of problems listed above:  Paroxysmal Afib s/p cardioversion She underwent successful cardioversion 11/30/20. She is in SB by EKG today with heart rate 53bpm. Symptomatically she is doing well. No bleeding issues with Eliquis. CHADSVASC at least 4 (age, HTN, female, CHF)I will update TSH. Continue rate control with Lopressor, Heart rates in the 50s but she is asymptomatic.   HTN Bps intermittently elevated. At home its around 130s-140s/70s. Patient is on Lopressor '25mg'$  BID and Losartan '100mg'$  daily. Somewhat reluctant to add another medication. Lifestyle changes discussed. She will continue to check her BP at home. If still high at follow-up consider switching Lopressor to Coreg vs addition of another medication.   HLD LDL 45 in 04/2020. Continue Crestor.   HFpEF Echo 07/2020 showed LVEF 60-65%, no WMA, G2DD, mod MR, mild to mod TR, mild AI. PCP recently discontinued lasix and potassium, she will have labs by PCP next week. She denies LLE, orthopnea, DOE, or pnd. She is euvolemic on exam. CHF education reviewed in detail.   Disposition: Follow up in 3 month(s) with MD/APP   Signed, Carlis Burnsworth Ninfa Meeker, PA-C  12/29/2020 9:24 AM    [facility]

## 2020-12-28 NOTE — Progress Notes (Signed)
Subjective:  Patient ID: Whitney Molina, female    DOB: Nov 16, 1940  Age: 80 y.o. MRN: [id number]  CC: The primary encounter diagnosis was Hyperlipidemia LDL goal <130. Diagnoses of Renal hypertension, Stage 3b chronic kidney disease ([place]), Acquired thrombophilia ([place]), and Persistent atrial fibrillation ([place]) were also pertinent to this visit.  HPI Whitney Molina presents foR   FOLLOW UP ON HYPERTENSION AND CKD STAGE 3   This visit occurred during the SARS-CoV-2 public health emergency.  Safety protocols were in place, including screening questions prior to the visit, additional usage of staff PPE, and extensive cleaning of exam room while observing appropriate contact time as indicated for disinfecting solutions.   HTN:  Patient is taking her medications as prescribed and notes no adverse effects.  Home BP readings have been done 2 times daily and are  generally < 140/80 .  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 . Exercises daily   Has been having some memory  issues   lately.  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  Made a major error recently but was able to correct . Feels that her concentration is not as good,  struggling more  Chronic kidney disease:  has been  taking lasix  daily since March. Reviewed prior workups,  no evidence of systolic dysfunction.  Advised to suspend lasix and repeat renal function   Tooth extraction coming up.  Tooth has no roots,  told there would be minimal bleeding.  Takes Eliquis    Outpatient Medications Prior to Visit  Medication Sig Dispense Refill   albuterol (VENTOLIN HFA) 108 (90 Base) MCG/ACT inhaler INHALE 1 PUFFS INTO THE LUNGS EVERY 6 HOURS AS NEEDED FOR WHEEZING OR SHORTNESS OF BREATH 18 g 2   apixaban (ELIQUIS) 5 MG TABS tablet Take 1 tablet (5 mg total) by mouth 2 (two) times daily. 60 tablet 5   Cholecalciferol (VITAMIN D3) 1000 units CAPS Take 1,000 Units by mouth  daily.     Co-Enzyme Q10 200 MG CAPS Take 1 tablet by mouth. Taking 1 every other day     escitalopram (LEXAPRO) 10 MG tablet Take 1 tablet (10 mg total) by mouth daily. 90 tablet 1   losartan (COZAAR) 100 MG tablet TAKE 1 TABLET BY MOUTH ONCE DAILY 90 tablet 1   metoprolol tartrate (LOPRESSOR) 25 MG tablet Take 1 tablet (25 mg total) by mouth 2 (two) times daily. 180 tablet 0   psyllium (METAMUCIL) 58.6 % packet Take 1 packet by mouth daily.     rosuvastatin (CRESTOR) 20 MG tablet TAKE 1 TABLET BY MOUTH AT BEDTIME 30 tablet 2   furosemide (LASIX) 20 MG tablet TAKE 1 TABLET BY MOUTH ONCE DAILY AS NEEDED FLUID RETENTION 30 tablet 3   potassium chloride SA (KLOR-CON) 20 MEQ tablet TAKE 1 TABLET BY MOUTH ONCE DAILY (Patient not taking: Reported on 12/29/2020) 30 tablet 0   hyoscyamine (LEVBID) 0.375 MG 12 hr tablet TAKE 1 TABLET BY MOUTH ONCE DAILY (Patient not taking: Reported on 12/28/2020) 90 tablet 1   No facility-administered medications prior to visit.    Review of Systems;  Patient denies headache, fevers, malaise, unintentional weight loss, skin rash, eye pain, sinus congestion and sinus pain, sore throat, dysphagia,  hemoptysis , cough, dyspnea, wheezing, chest pain, palpitations, orthopnea, edema, abdominal pain, nausea, melena, diarrhea, constipation, flank pain, dysuria, hematuria, urinary  Frequency, nocturia, numbness, tingling, seizures,  Focal weakness, Loss of consciousness,  Tremor, insomnia, depression, anxiety, and suicidal ideation.      Objective:  BP (!) 162/90 (BP Location: Left Arm, Patient Position: Sitting, Cuff Size: Normal)   Pulse 60   Temp (!) 95.8 F (35.4 C) (Temporal)   Resp 15   Ht 5' 3.5" (1.613 m)   Wt 138 lb (62.6 kg)   SpO2 96%   BMI 24.06 kg/m   BP Readings from Last 3 Encounters:  12/29/20 (!) 148/74  12/28/20 (!) 162/90  11/30/20 135/73    Wt Readings from Last 3 Encounters:  12/29/20 137 lb 8 oz (62.4 kg)  12/28/20 138 lb (62.6 kg)   11/30/20 141 lb 1.5 oz (64 kg)    General appearance: alert, cooperative and appears stated age Ears: normal TM's and external ear canals both ears Throat: lips, mucosa, and tongue normal; teeth and gums normal Neck: no adenopathy, no carotid bruit, supple, symmetrical, trachea midline and thyroid not enlarged, symmetric, no tenderness/mass/nodules Back: symmetric, no curvature. ROM normal. No CVA tenderness. Lungs: clear to auscultation bilaterally Heart: regular rate and rhythm, S1, S2 normal, no murmur, click, rub or gallop Abdomen: soft, non-tender; bowel sounds normal; no masses,  no organomegaly Pulses: 2+ and symmetric Skin: Skin color, texture, turgor normal. No rashes or lesions Lymph nodes: Cervical, supraclavicular, and axillary nodes normal.  Lab Results  Component Value Date   HGBA1C 6.3 04/20/2020   HGBA1C 5.9 04/01/2018   HGBA1C 6.0 02/19/2017    Lab Results  Component Value Date   CREATININE 1.07 11/26/2020   CREATININE 0.98 07/21/2020   CREATININE 1.03 05/25/2020    Lab Results  Component Value Date   WBC 7.7 11/26/2020   HGB 12.5 11/26/2020   HCT 36.2 11/26/2020   PLT 222.0 11/26/2020   GLUCOSE 84 11/26/2020   CHOL 106 04/20/2020   TRIG 151.0 (H) 04/20/2020   HDL 31.40 (L) 04/20/2020   LDLDIRECT 145.0 04/01/2018   LDLCALC 45 04/20/2020   ALT 18 11/26/2020   AST 19 11/26/2020   NA 140 11/26/2020   K 3.8 11/26/2020   CL 104 11/26/2020   CREATININE 1.07 11/26/2020   BUN 16 11/26/2020   CO2 29 11/26/2020   TSH 1.19 04/20/2020   INR 1.02 03/24/2015   HGBA1C 6.3 04/20/2020   MICROALBUR 9.7 (H) 04/20/2020    No results found.  Assessment & Plan:   Problem List Items Addressed This Visit       Medium   Hyperlipidemia LDL goal <130 - Primary   Relevant Orders   Lipid panel     Unprioritized   Renal hypertension   Relevant Orders   Comprehensive metabolic panel   Acquired thrombophilia ([place])    Secondary to PAF.  Reviewed risk of  embolic CVA with patient and advised her to continue Eliquis       Persistent atrial fibrillation (HCC)    Currently in sinus rhythm s/p recent ablation in July  continue Lopressor 25 mg twice daily, Eliquis 5 mg twice daily for CHADS score of 4       CKD (chronic kidney disease) stage 3, GFR 30-59 ml/min (HCC)    GFR has been decreased for the past 6 months.  Suspending lasix and repeating BMET.  Discussed potentional nephrology referral  Kidneys appeared normal apn recent renal artery dopplers.        I provided  30 minutes of  face-to-face time during this encounter reviewing patient's current problems and past procedures , last 12 months of labs and imaging  studies, providing counseling on the above mentioned problems , and coordination  of care .   Medications Discontinued During This Encounter  Medication Reason   hyoscyamine (LEVBID) 0.375 MG 12 hr tablet     Follow-up: No follow-ups on file.   [name], MD

## 2020-12-28 NOTE — Assessment & Plan Note (Signed)
GFR has been decreased for the past 6 months.  Suspending lasix and repeating BMET.  Discussed potentional nephrology referral  Kidneys appeared normal apn recent renal artery dopplers.

## 2020-12-29 ENCOUNTER — Telehealth: Payer: Self-pay

## 2020-12-29 ENCOUNTER — Encounter: Payer: Self-pay | Admitting: Medical

## 2020-12-29 ENCOUNTER — Ambulatory Visit (INDEPENDENT_AMBULATORY_CARE_PROVIDER_SITE_OTHER): Payer: Medicare Other | Admitting: Medical

## 2020-12-29 ENCOUNTER — Other Ambulatory Visit: Payer: Self-pay

## 2020-12-29 VITALS — BP 148/74 | HR 56 | Ht 63.0 in | Wt 137.5 lb

## 2020-12-29 DIAGNOSIS — I10 Essential (primary) hypertension: Secondary | ICD-10-CM | POA: Diagnosis not present

## 2020-12-29 DIAGNOSIS — I480 Paroxysmal atrial fibrillation: Secondary | ICD-10-CM | POA: Diagnosis not present

## 2020-12-29 DIAGNOSIS — I5032 Chronic diastolic (congestive) heart failure: Secondary | ICD-10-CM | POA: Diagnosis not present

## 2020-12-29 DIAGNOSIS — E782 Mixed hyperlipidemia: Secondary | ICD-10-CM | POA: Diagnosis not present

## 2020-12-29 NOTE — Assessment & Plan Note (Signed)
Secondary to PAF.  Reviewed risk of embolic CVA with patient and advised her to continue Eliquis

## 2020-12-29 NOTE — Patient Instructions (Addendum)
Medication Instructions:  Your physician has recommended you make the following change in your medication:   STOP Furosemide (Lasix)  *If you need a refill on your cardiac medications before your next appointment, please call your pharmacy*   Lab Work: TSH today  If you have labs (blood work) drawn today and your tests are completely normal, you will receive your results only by: [place] (if you have MyChart) OR A paper copy in the mail If you have any lab test that is abnormal or we need to change your treatment, we will call you to review the results.   Testing/Procedures: None   Follow-Up: At [facility], you and your health needs are our priority.  As part of our continuing mission to provide you with exceptional heart care, we have created designated Provider Care Teams.  These Care Teams include your primary Cardiologist (physician) and Advanced Practice Providers (APPs -  Physician Assistants and Nurse Practitioners) who all work together to provide you with the care you need, when you need it.  We recommend signing up for the patient portal called "MyChart".  Sign up information is provided on this After Visit Summary.  MyChart is used to connect with patients for Virtual Visits (Telemedicine).  Patients are able to view lab/test results, encounter notes, upcoming appointments, etc.  Non-urgent messages can be sent to your provider as well.   To learn more about what you can do with MyChart, go to [email or web address].    Your next appointment:   3 month(s)  The format for your next appointment:   In Person  Provider:   You may see [name], MD or one of the following Advanced Practice Providers on your designated Care Team:   Murray Hodgkins, NP Christell Faith, PA-C Marrianne Mood, PA-C Cadence Kathlen Mody, [place]   Other Instructions Please monitor blood pressures and keep a log of your readings.   Make sure to check 2 hours after your medications.    AVOID these things for 30 minutes before checking your blood pressure: No Drinking caffeine. No Drinking alcohol. No Eating. No Smoking. No Exercising.  Five minutes before checking your blood pressure: Pee. Sit in a dining chair. Avoid sitting in a soft couch or armchair. Be quiet. Do not talk.

## 2020-12-29 NOTE — Assessment & Plan Note (Signed)
Currently in sinus rhythm s/p recent ablation in July  continue Lopressor 25 mg twice daily, Eliquis 5 mg twice daily for CHADS score of 4

## 2020-12-29 NOTE — Telephone Encounter (Signed)
Lab order placed that [facility] wants her to have drawn and pt requested to have it done here at our office when she comes in for her other labs.

## 2021-01-03 ENCOUNTER — Other Ambulatory Visit: Payer: Self-pay

## 2021-01-03 ENCOUNTER — Telehealth: Payer: Self-pay | Admitting: Family

## 2021-01-03 ENCOUNTER — Other Ambulatory Visit (INDEPENDENT_AMBULATORY_CARE_PROVIDER_SITE_OTHER): Payer: Medicare Other

## 2021-01-03 ENCOUNTER — Ambulatory Visit (INDEPENDENT_AMBULATORY_CARE_PROVIDER_SITE_OTHER): Payer: Medicare Other

## 2021-01-03 ENCOUNTER — Other Ambulatory Visit: Payer: Self-pay | Admitting: Family

## 2021-01-03 VITALS — BP 170/90 | HR 58

## 2021-01-03 DIAGNOSIS — I480 Paroxysmal atrial fibrillation: Secondary | ICD-10-CM | POA: Diagnosis not present

## 2021-01-03 DIAGNOSIS — I129 Hypertensive chronic kidney disease with stage 1 through stage 4 chronic kidney disease, or unspecified chronic kidney disease: Secondary | ICD-10-CM | POA: Diagnosis not present

## 2021-01-03 DIAGNOSIS — E785 Hyperlipidemia, unspecified: Secondary | ICD-10-CM | POA: Diagnosis not present

## 2021-01-03 DIAGNOSIS — I10 Essential (primary) hypertension: Secondary | ICD-10-CM

## 2021-01-03 DIAGNOSIS — Z0130 Encounter for examination of blood pressure without abnormal findings: Secondary | ICD-10-CM

## 2021-01-03 DIAGNOSIS — M810 Age-related osteoporosis without current pathological fracture: Secondary | ICD-10-CM

## 2021-01-03 DIAGNOSIS — N1831 Chronic kidney disease, stage 3a: Secondary | ICD-10-CM

## 2021-01-03 LAB — LIPID PANEL
Cholesterol: 152 mg/dL (ref 0–200)
HDL: 36 mg/dL — ABNORMAL LOW (ref >39.00)
LDL Cholesterol: 89 mg/dL (ref 0–99)
NonHDL: 116.09
Total CHOL/HDL Ratio: 4
Triglycerides: 134 mg/dL (ref 0.0–149.0)
VLDL: 26.8 mg/dL (ref 0.0–40.0)

## 2021-01-03 LAB — COMPREHENSIVE METABOLIC PANEL
ALT: 15 U/L (ref 0–35)
AST: 18 U/L (ref 0–37)
Albumin: 4.1 g/dL (ref 3.5–5.2)
Alkaline Phosphatase: 71 U/L (ref 39–117)
BUN: 16 mg/dL (ref 6–23)
CO2: 30 mEq/L (ref 19–32)
Calcium: 9.2 mg/dL (ref 8.4–10.5)
Chloride: 103 mEq/L (ref 96–112)
Creatinine, Ser: 1.11 mg/dL (ref 0.40–1.20)
GFR: 47.3 mL/min — ABNORMAL LOW (ref >60.00)
Glucose, Bld: 78 mg/dL (ref 70–99)
Potassium: 3.5 mEq/L (ref 3.5–5.1)
Sodium: 140 mEq/L (ref 135–145)
Total Bilirubin: 0.8 mg/dL (ref 0.2–1.2)
Total Protein: 7.1 g/dL (ref 6.0–8.3)

## 2021-01-03 LAB — TSH: TSH: 1.02 u[IU]/mL (ref 0.35–5.50)

## 2021-01-03 MED ORDER — CARVEDILOL 6.25 MG PO TABS: 6.2500 mg | ORAL_TABLET | Freq: Two times a day (BID) | ORAL | 0 refills | Status: DC

## 2021-01-03 NOTE — Progress Notes (Signed)
Patient here for nurse visit BP check per order from Dr. Derrel Nip.   Patient reports compliance with prescribed BP medications: yes  BP Medication the patient is taking: Metoprolol, Losartan  Last dose of BP medication:  7am 01/03/21  BP Readings from Last 3 Encounters:  01/03/21 (!) 170/90  12/29/20 (!) 148/74  12/28/20 (!) 162/90   Pulse Readings from Last 3 Encounters:  01/03/21 (!) 58  12/29/20 (!) 56  12/28/20 60   Pt presented for a blood pressure check during lab appointment at [facility] states that she took her blood pressure medication on 01/03/21 at 7am. Initial reading was on the Rt Arm as patient just received a blood draw to the left arm. Initial reading was 160/98, patient brought in a home machine and it read 183/99. Waited 10 minutes and rechecked Blood Pressure. Rechecked blood pressure was on the Left arm and was 170/90. Used in office blood pressure machine and it read 171/81. Pt reports no distress or concerns.   Any Headaches? Dizziness? Light Headedness?  No. Pt reports no symptoms.   Lunetta Marina Zoila Shutter, CMA   Pt was given provider instructions from Mable Paris, FNP   :Please advise patient   Consulted with cadence furth,cardiology   She advised to STOP lopressor     Start coreg 6.'25mg'$  twice daily    Monitor blood pressure at home   If you develop any chest pain, shortness of breath, headache or vision changes, please call 911 immediately.    Please ensure you have an in person visit with  Dr Derrel Nip or cardiology in the next week.  Pt verbalized understanding and had no further questions.

## 2021-01-03 NOTE — Telephone Encounter (Signed)
Please advise patient  Consulted with cadence furth,cardiology  She advised to STOP lopressor   Start coreg 6.'25mg'$  twice daily   Monitor blood pressure at home  If you develop any chest pain, shortness of breath, headache or vision changes, please call 911 immediately.   Please ensure you have an in person visit with  Dr Derrel Nip or cardiology in the next week.

## 2021-01-03 NOTE — Telephone Encounter (Signed)
Pt has been advised. Whitney Molina agreed to scheduling an appointment with Dr. Derrel Nip during check out from today's appointment

## 2021-01-04 ENCOUNTER — Ambulatory Visit: Payer: Medicare Other | Admitting: Adult Health

## 2021-01-04 ENCOUNTER — Encounter: Payer: Self-pay | Admitting: Adult Health

## 2021-01-04 DIAGNOSIS — J42 Unspecified chronic bronchitis: Secondary | ICD-10-CM | POA: Insufficient documentation

## 2021-01-04 DIAGNOSIS — J410 Simple chronic bronchitis: Secondary | ICD-10-CM

## 2021-01-04 NOTE — Patient Instructions (Signed)
Continue on current regimen  Activity as tolerated.  Follow up with Dr. Mortimer Fries in 1 year and As needed

## 2021-01-04 NOTE — Progress Notes (Signed)
$'@Patient'v$  ID: Whitney Molina, female    DOB: 05/30/1940, 80 y.o.   MRN: [id number]  Chief Complaint  Patient presents with   Follow-up    Referring provider: [name], MD  HPI: 80 year old female never smoker followed for mild airflow obstruction/chronic bronchitis Medical history significant for A. fib on Eliquis, Lymphoma   TEST/EVENTS :  Spirometry: 01/29/2017- FVC 2.5 (94%), FEV1 1.7 (84%), ratio 67  01/04/2021 Follow up : Chronic Bronchitis  Patient presents for a 1 year follow-up.  Patient has history of chronic bronchitis.  She is a never smoker.  Pulmonary function testing showed mild airflow obstruction.  She has been maintained on albuterol as needed.  Says that she is doing very well.  She remains active.  She has no flare of her cough or wheezing.  Says she has been doing very well overall. She still works.  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 Chest x-ray November 26, 2020 showed clear lungs without acute process Uses albuterol on average 1 time a week.   No Known Allergies  Immunization History  Administered Date(s) Administered   Fluad Quad(high Dose 65+) 02/13/2019, 03/05/2020   Influenza Split 01/16/2013, 02/26/2014   Influenza, High Dose Seasonal PF 01/10/2016, 02/19/2017, 03/08/2018   Influenza,inj,Quad PF,6+ Mos 04/21/2015   PFIZER(Purple Top)SARS-COV-2 Vaccination 05/22/2019, 06/12/2019, 06/16/2020   Pneumococcal Conjugate-13 06/24/2014   Pneumococcal Polysaccharide-23 04/01/2018   Pneumococcal-Unspecified 05/16/2011   Td 11/12/2012   Tdap 11/21/2012   Zoster, Live 02/23/2015    Past Medical History:  Diagnosis Date   Basal cell carcinoma of nose    Bronchitis    GERD (gastroesophageal reflux disease)    Heart murmur    Hemorrhoids    History of basal cell carcinoma    right upper lip, right ant shoulder   History of colon polyps    History of dysplastic nevus 12/05/2001   left paraspinal upper back, upper back spinal   History  of kidney stones    History of mammogram 2016   History of squamous cell carcinoma 02/07/2005   left ant chest   Hyperlipidemia    Hypertension    Non Hodgkin's lymphoma ([place])    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 60 tablet 5   carvedilol (COREG) 6.25 MG tablet Take 1 tablet (6.25 mg total) by mouth 2 (two) times daily with a meal. 60 tablet 0   Cholecalciferol (VITAMIN D3) 1000 units CAPS Take 1,000 Units by mouth daily.     Co-Enzyme Q10 200 MG CAPS Take 1 tablet by mouth. Taking 1 every other day     escitalopram (LEXAPRO) 10 MG tablet Take 1 tablet (10 mg total) by mouth daily. 90 tablet 1   losartan (COZAAR) 100 MG tablet TAKE 1 TABLET BY MOUTH ONCE DAILY 90 tablet 1   psyllium (METAMUCIL) 58.6 % packet Take 1 packet by mouth daily.     rosuvastatin (CRESTOR) 20 MG tablet TAKE  1 TABLET BY MOUTH AT BEDTIME 30 tablet 2   No facility-administered medications prior to visit.     Review of Systems:   Constitutional:   No  weight loss, night sweats,  Fevers, chills, fatigue, or  lassitude.  HEENT:   No headaches,  Difficulty swallowing,  Tooth/dental problems, or  Sore throat,                No sneezing, itching, ear ache, nasal congestion, post nasal drip,   CV:  No chest pain,  Orthopnea, PND, swelling in lower extremities, anasarca, dizziness, palpitations, syncope.   GI  No heartburn, indigestion, abdominal pain, nausea, vomiting, diarrhea, change in bowel habits, loss of appetite,  bloody stools.   Resp: No shortness of breath with exertion or at rest.  No excess mucus, no productive cough,  No non-productive cough,  No coughing up of blood.  No change in color of mucus.  No wheezing.  No chest wall deformity  Skin: no rash or lesions.  GU: no dysuria, change in color of urine, no urgency or frequency.  No flank pain, no hematuria   MS:  No joint pain or swelling.  No decreased range of motion.  No back pain.    Physical Exam  BP 140/82 (BP Location: Left Arm, Patient Position: Sitting, Cuff Size: Normal)   Pulse 63   Temp 98 F (36.7 C) (Oral)   Ht '5\' 3"'$  (1.6 m)   Wt 138 lb 3.2 oz (62.7 kg)   SpO2 96%   BMI 24.48 kg/m   GEN: A/Ox3; pleasant , NAD, well nourished    HEENT:  [place]/AT,  EACs-clear, TMs-wnl, NOSE-clear, THROAT-clear, no lesions, no postnasal drip or exudate noted.   NECK:  Supple w/ fair ROM; no JVD; normal carotid impulses w/o bruits; no thyromegaly or nodules palpated; no lymphadenopathy.    RESP  Clear  P & A; w/o, wheezes/ rales/ or rhonchi. no accessory muscle use, no dullness to percussion  CARD:  RRR, no m/r/g, no peripheral edema, pulses intact, no cyanosis or clubbing.  GI:   Soft & nt; nml bowel sounds; no organomegaly or masses detected.   Musco: Warm bil, no deformities or joint swelling noted.   Neuro: alert, no focal deficits noted.    Skin: Warm, no lesions or rashes    Lab Results:  CBC    Component Value Date/Time   WBC 7.7 11/26/2020 1158   RBC 3.82 (L) 11/26/2020 1158   HGB 12.5 11/26/2020 1158   HGB 13.1 09/10/2013 0758   HCT 36.2 11/26/2020 1158   HCT 39.5 09/10/2013 0758   PLT 222.0 11/26/2020 1158   PLT 233 09/10/2013 0758   MCV 94.7 11/26/2020 1158   MCV 93 09/10/2013 0758   MCH 31.1 07/21/2020 1307   MCHC 34.6 11/26/2020 1158   RDW 13.7 11/26/2020 1158   RDW 13.2 09/10/2013 0758   LYMPHSABS 2.0 11/26/2020 1158   MONOABS 0.7 11/26/2020 1158   EOSABS 0.1 11/26/2020 1158   BASOSABS 0.0 11/26/2020  1158    BMET    Component Value Date/Time   NA 140 01/03/2021 0942   NA 138 09/10/2013 0758   K 3.5 01/03/2021 0942   K 3.3 (L) 09/10/2013 0758   CL 103 01/03/2021 0942   CL 100 09/10/2013 0758   CO2 30 01/03/2021 0942   CO2 32 09/10/2013 0758   GLUCOSE 78 01/03/2021 0942   GLUCOSE 85 09/10/2013 0758   BUN 16 01/03/2021 0942   BUN  13 09/10/2013 0758   CREATININE 1.11 01/03/2021 0942   CREATININE 0.96 12/22/2013 0857   CALCIUM 9.2 01/03/2021 0942   CALCIUM 9.2 09/10/2013 0758   GFRNONAA 59 (L) 07/21/2020 1307   GFRNONAA 59 (L) 12/22/2013 0857   GFRAA >60 01/21/2020 1309   GFRAA 68 12/22/2013 0857    BNP No results found for: BNP  ProBNP No results found for: PROBNP  Imaging: No results found.    No flowsheet data found.  No results found for: NITRICOXIDE      Assessment & Plan:   Chronic bronchitis (HCC) Chronic bronchitis under good control.  Patient's continue with her activities.  Albuterol as needed.  Plan  Patient Instructions  Continue on current regimen  Activity as tolerated.  Follow up with Dr. Mortimer Fries in 1 year and As needed          Rexene Edison, NP 01/04/2021

## 2021-01-04 NOTE — Assessment & Plan Note (Signed)
Chronic bronchitis under good control.  Patient's continue with her activities.  Albuterol as needed.  Plan  Patient Instructions  Continue on current regimen  Activity as tolerated.  Follow up with Dr. Mortimer Fries in 1 year and As needed

## 2021-01-05 NOTE — Progress Notes (Signed)
Left message for patient to return call back for lab results.

## 2021-01-06 ENCOUNTER — Telehealth: Payer: Self-pay

## 2021-01-06 NOTE — Telephone Encounter (Signed)
LMTCB in regards to lab results.  

## 2021-01-07 NOTE — Addendum Note (Signed)
Addended by: [name] on: 01/07/2021 03:48 PM   Modules accepted: Orders

## 2021-01-13 ENCOUNTER — Telehealth: Payer: Self-pay | Admitting: Internal Medicine

## 2021-01-13 NOTE — Telephone Encounter (Signed)
Rejection Reason - Patient did not respond" Whitney Molina said on Jan 13, 2021 9:32 AM  Msg from [facility]

## 2021-01-14 ENCOUNTER — Telehealth: Payer: Self-pay | Admitting: Medical

## 2021-01-14 NOTE — Telephone Encounter (Signed)
Pt c/o BP issue: STAT if pt c/o blurred vision, one-sided weakness or slurred speech  1. What are your last 5 BP readings? 162/92  168/90   2. Are you having any other symptoms (ex. Dizziness, headache, blurred vision, passed out)? Memory mix up gets frustrated denies other symptoms   3. What is your BP issue? Patient meds recently changed now taking coreg 6.25 mg po BID .  Patient reports she takes an extra 1/2 tablet during day when bp is elevated and she feels it going up.  She states this starts to help and wants to discuss dose/ meds

## 2021-01-14 NOTE — Telephone Encounter (Signed)
Called the patient. Lmtcb. 

## 2021-01-14 NOTE — Telephone Encounter (Signed)
Placed call to pt. Pt got in contact with [facility] and has an appt scheduled.

## 2021-01-18 NOTE — Telephone Encounter (Signed)
Called patient back and left a VM for her to call back.

## 2021-01-19 ENCOUNTER — Inpatient Hospital Stay: Payer: Medicare Other

## 2021-01-19 ENCOUNTER — Inpatient Hospital Stay: Payer: Medicare Other | Admitting: Internal Medicine

## 2021-01-20 ENCOUNTER — Telehealth: Payer: Self-pay | Admitting: Internal Medicine

## 2021-01-20 NOTE — Telephone Encounter (Signed)
Patient informed, Due to the high volume of calls and your symptoms we have to forward your call to our Triage Nurse to expedient your call. Please hold for the transfer.  Patient transferred to Access Nurse. Due to testing positive for COVID yesterday,having chest congestion and would like to know if Dr.Tullo can call in a medicine to help with her symptoms.No openings in office or virtual.

## 2021-01-20 NOTE — Telephone Encounter (Signed)
Patient called back in to return the call to Access Nurse, pt transferred to [facility] at Access Nurse.

## 2021-01-20 NOTE — Telephone Encounter (Signed)
Called to speak with Whitney Molina. Whitney Molina states that she tested positive for covid 2 days ago. Pt c/o cough, chest congestion, sore throat. Pt declines any fever, SOB, or discomfort at this time and declines needing emergency services or going to an urgent care for evaluation. Pt states that she is a heart patient and has AFIB and asks what OTC medication she can take to relieve her chest congestion. Pt states that the constant coughing with chest congestion is painful due to AFIB. She states that she has mucinex at home but is unsure if it is safe to take at this time due to heart problems. Please advise.

## 2021-01-20 NOTE — Telephone Encounter (Signed)
Access nurse gave patient at home care advise for covid positive.

## 2021-01-20 NOTE — Telephone Encounter (Signed)
Reviewed. Chart.  History of afib and hypertension.  Per note, s/p ablation and was SR last visit.  Reviewed allergies.  Ok to take mucinex if plain mucinex.  Would avoid decongestants. Can take delsym for cough.  I do recommend evaluation (virtual visit or face to face) to discuss if other treatment warranted - to discuss oral antivirals, prednisone, etc.  Please also forward to Dr Derrel Nip (PCP) for [facility] and f/u.

## 2021-01-21 NOTE — Telephone Encounter (Signed)
Called to speak with Whitney Molina and discuss patient symptoms. Whitney Molina states that Whitney Molina did not tell the truth during her talk with me yesterday as she had a fever and the fever went up to 102 last night before she has taken any tylenol. Whitney Molina states that her fever is now at 100 and she is resting. Whitney Molina the provider instructions with OTC medication and he states that he will go and get them today. Informed him that Dr. Nicki Reaper would want her to be evaluated and asked if he would be willing to take her to the urgent care to be evaluated. Whitney Molina states that he will try as Whitney Molina did not want to go to be evaluated. She only wanted OTC medicaitons. I informed Whitney Molina that we could not send any antibiotics or other medication without her being evaluated and seen by a provider, and with her running a fever it is important that she be seen. I gave instructions on where the [facility]  is located as well as [facility]. Whitney Molina verbalized understanding and states that he will ask his wife if she is willing to go to the urgent care for more medication.

## 2021-01-21 NOTE — Telephone Encounter (Signed)
Patients husband is calling in to see if an antibiotic can be sent in for her.He stated she is still having congestion,a fever and unable to talk.Please call him at [phone].

## 2021-01-21 NOTE — Telephone Encounter (Signed)
Patient was transferred to me to inform of dr scotts message. Once I started to speak patients husband said something into the phone and disconnected the call. Attempted to call patient back but phone kept ringing. Forwarding it to PCP as directed for FYI per on call physician.

## 2021-01-21 NOTE — Telephone Encounter (Signed)
Spoke with pt and she stated that she did not go to UC but she did start taking the mucinex. She stated that since starting that she has started to feel better. Pt stated that she is going to just stay home and rest.

## 2021-01-26 ENCOUNTER — Other Ambulatory Visit: Payer: Self-pay | Admitting: Family

## 2021-01-26 DIAGNOSIS — I129 Hypertensive chronic kidney disease with stage 1 through stage 4 chronic kidney disease, or unspecified chronic kidney disease: Secondary | ICD-10-CM

## 2021-02-04 ENCOUNTER — Inpatient Hospital Stay: Payer: Medicare Other | Attending: Internal Medicine | Admitting: Internal Medicine

## 2021-02-04 ENCOUNTER — Encounter: Payer: Self-pay | Admitting: Internal Medicine

## 2021-02-04 ENCOUNTER — Inpatient Hospital Stay: Payer: Medicare Other

## 2021-02-04 DIAGNOSIS — Z79899 Other long term (current) drug therapy: Secondary | ICD-10-CM | POA: Diagnosis not present

## 2021-02-04 DIAGNOSIS — N183 Chronic kidney disease, stage 3 unspecified: Secondary | ICD-10-CM | POA: Insufficient documentation

## 2021-02-04 DIAGNOSIS — E785 Hyperlipidemia, unspecified: Secondary | ICD-10-CM | POA: Diagnosis not present

## 2021-02-04 DIAGNOSIS — I4891 Unspecified atrial fibrillation: Secondary | ICD-10-CM | POA: Insufficient documentation

## 2021-02-04 DIAGNOSIS — C8213 Follicular lymphoma grade II, intra-abdominal lymph nodes: Secondary | ICD-10-CM

## 2021-02-04 DIAGNOSIS — Z87442 Personal history of urinary calculi: Secondary | ICD-10-CM | POA: Insufficient documentation

## 2021-02-04 DIAGNOSIS — K219 Gastro-esophageal reflux disease without esophagitis: Secondary | ICD-10-CM | POA: Diagnosis not present

## 2021-02-04 DIAGNOSIS — Z8572 Personal history of non-Hodgkin lymphomas: Secondary | ICD-10-CM | POA: Diagnosis not present

## 2021-02-04 DIAGNOSIS — Z8616 Personal history of COVID-19: Secondary | ICD-10-CM | POA: Insufficient documentation

## 2021-02-04 DIAGNOSIS — Z85828 Personal history of other malignant neoplasm of skin: Secondary | ICD-10-CM | POA: Diagnosis not present

## 2021-02-04 DIAGNOSIS — Z86010 Personal history of colonic polyps: Secondary | ICD-10-CM | POA: Diagnosis not present

## 2021-02-04 DIAGNOSIS — R011 Cardiac murmur, unspecified: Secondary | ICD-10-CM | POA: Insufficient documentation

## 2021-02-04 DIAGNOSIS — Z86018 Personal history of other benign neoplasm: Secondary | ICD-10-CM | POA: Diagnosis not present

## 2021-02-04 DIAGNOSIS — I129 Hypertensive chronic kidney disease with stage 1 through stage 4 chronic kidney disease, or unspecified chronic kidney disease: Secondary | ICD-10-CM | POA: Insufficient documentation

## 2021-02-04 DIAGNOSIS — Z7901 Long term (current) use of anticoagulants: Secondary | ICD-10-CM | POA: Insufficient documentation

## 2021-02-04 DIAGNOSIS — Z803 Family history of malignant neoplasm of breast: Secondary | ICD-10-CM | POA: Insufficient documentation

## 2021-02-04 LAB — CBC WITH DIFFERENTIAL/PLATELET
Abs Immature Granulocytes: 0.03 K/uL (ref 0.00–0.07)
Basophils Absolute: 0 K/uL (ref 0.0–0.1)
Basophils Relative: 0 %
Eosinophils Absolute: 0.1 K/uL (ref 0.0–0.5)
Eosinophils Relative: 1 %
HCT: 35.4 % — ABNORMAL LOW (ref 36.0–46.0)
Hemoglobin: 12 g/dL (ref 12.0–15.0)
Immature Granulocytes: 0 %
Lymphocytes Relative: 20 %
Lymphs Abs: 1.9 K/uL (ref 0.7–4.0)
MCH: 31.7 pg (ref 26.0–34.0)
MCHC: 33.9 g/dL (ref 30.0–36.0)
MCV: 93.7 fL (ref 80.0–100.0)
Monocytes Absolute: 0.7 K/uL (ref 0.1–1.0)
Monocytes Relative: 7 %
Neutro Abs: 6.6 K/uL (ref 1.7–7.7)
Neutrophils Relative %: 72 %
Platelets: 240 K/uL (ref 150–400)
RBC: 3.78 MIL/uL — ABNORMAL LOW (ref 3.87–5.11)
RDW: 12.7 % (ref 11.5–15.5)
WBC: 9.3 K/uL (ref 4.0–10.5)
nRBC: 0 % (ref 0.0–0.2)

## 2021-02-04 LAB — COMPREHENSIVE METABOLIC PANEL WITH GFR
ALT: 20 U/L (ref 0–44)
AST: 22 U/L (ref 15–41)
Albumin: 4 g/dL (ref 3.5–5.0)
Alkaline Phosphatase: 69 U/L (ref 38–126)
Anion gap: 7 (ref 5–15)
BUN: 19 mg/dL (ref 8–23)
CO2: 28 mmol/L (ref 22–32)
Calcium: 8.8 mg/dL — ABNORMAL LOW (ref 8.9–10.3)
Chloride: 100 mmol/L (ref 98–111)
Creatinine, Ser: 1.06 mg/dL — ABNORMAL HIGH (ref 0.44–1.00)
GFR, Estimated: 53 mL/min — ABNORMAL LOW (ref >60)
Glucose, Bld: 104 mg/dL — ABNORMAL HIGH (ref 70–99)
Potassium: 3.6 mmol/L (ref 3.5–5.1)
Sodium: 135 mmol/L (ref 135–145)
Total Bilirubin: 0.8 mg/dL (ref 0.3–1.2)
Total Protein: 7.2 g/dL (ref 6.5–8.1)

## 2021-02-04 LAB — LACTATE DEHYDROGENASE: LDH: 162 U/L (ref 98–192)

## 2021-02-04 NOTE — Progress Notes (Signed)
[facility] OFFICE PROGRESS NOTE  Patient Care Team: [name], MD as PCP - General (Internal Medicine) [name], MD as PCP - Cardiology (Cardiology) Bary Castilla Forest Gleason, MD (General Surgery)  Cancer Staging No matching staging information was found for the patient.   Oncology History Overview Note   # NOV 9211- FOLLICULAR LYMPHOMA G-2; [s/p Bx- RETROPERITONEAL/LEFT Peri-aortic LN ~ 2.5CM [incidental on CT scan in 2016; CT- Nov 2014- ~1CM]/ PET 2016-Left Peri-aortic LN; Suv ~15; Ext Iliac LN 6 mm; Feb 2017- Unchanged LN; START Rituxan q week x4 [finished march 15th]; PET JAN 30th 2018- NED.   # Hx of Shingles [Sep 2016]-   # Kidney cysts [previous Dr.Wolfe]; Afib on eliquis [Dr.Brian; [facility]]  DIAGNOSIS: Follicle lymphoma-G-2  STAGE: Stage II       ;GOALS: Control  CURRENT/MOST RECENT THERAPY: Surveillance    Follicular lymphoma grade ii, intra-abdominal lymph nodes ([place])    INTERVAL HISTORY:Accompanied by husband.  Walk independently.   Whitney Molina 80 y.o.  female pleasant patient above history of stage II follicular lymphoma-grade 2 is here for follow-up.  The interim patient was diagnosed with COVID-no hospitalizations.  However felt quite fatigued.  Patient is also on Eliquis.  For A. fib.  Complains of extreme fatigue-awaiting to see cardiology again next week.  She has been referred to nephrology for chronic kidney disease.  Review of Systems  Constitutional:  Positive for malaise/fatigue and weight loss. Negative for chills, diaphoresis and fever.  HENT:  Negative for nosebleeds and sore throat.   Eyes:  Negative for double vision.  Respiratory:  Negative for cough, hemoptysis, sputum production, shortness of breath and wheezing.   Cardiovascular:  Negative for chest pain, palpitations, orthopnea and leg swelling.  Gastrointestinal:  Negative for abdominal pain, blood in stool, constipation, diarrhea, heartburn, melena, nausea  and vomiting.  Genitourinary:  Negative for dysuria, frequency and urgency.  Musculoskeletal:  Negative for back pain and joint pain.  Skin: Negative.  Negative for itching and rash.  Neurological:  Negative for dizziness, tingling, focal weakness, weakness and headaches.  Endo/Heme/Allergies:  Does not bruise/bleed easily.  Psychiatric/Behavioral:  Negative for depression. The patient is not nervous/anxious and does not have insomnia.      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 left chest   OOPHORECTOMY     TONSILLECTOMY  1971   TUBAL LIGATION  1975   VAGINAL PROLAPSE REPAIR  July 2001   Pelvic Prolapse    FAMILY HISTORY :   Family History  Problem Relation Age of Onset   Stroke Mother    Breast cancer Mother 100   Stroke Father    Dementia Father    Cancer Brother        bladder   Stroke Brother    Heart attack Brother    Breast cancer Paternal Aunt  49   Bipolar disorder Son     SOCIAL HISTORY:   Social History   Tobacco Use   Smoking status: Never   Smokeless tobacco: Never  Vaping Use   Vaping Use: Never used  Substance Use Topics   Alcohol use: No   Drug use: No    ALLERGIES:  has No Known Allergies.  MEDICATIONS:  Current Outpatient Medications  Medication Sig Dispense Refill   albuterol (VENTOLIN HFA) 108 (90 Base) MCG/ACT inhaler INHALE 1 PUFFS INTO THE LUNGS EVERY 6 HOURS AS NEEDED FOR WHEEZING OR SHORTNESS OF BREATH 18 g 2   apixaban (ELIQUIS) 5 MG TABS tablet Take 1 tablet (5 mg total) by mouth 2 (two) times daily. 60 tablet 5   carvedilol (COREG) 6.25 MG tablet TAKE 1 TABLET BY MOUTH TWICE DAILY WITH A MEAL 60 tablet 0   Cholecalciferol (VITAMIN D3) 1000 units CAPS Take 1,000 Units by mouth daily.     Co-Enzyme Q10 200 MG CAPS Take 1 tablet by mouth. Taking 1 every other day     escitalopram (LEXAPRO) 10 MG tablet Take 1 tablet (10 mg total) by mouth daily. 90 tablet 1   losartan (COZAAR) 100 MG tablet TAKE 1 TABLET BY MOUTH ONCE DAILY 90 tablet 1   psyllium (METAMUCIL) 58.6 % packet Take 1 packet by mouth daily.     rosuvastatin (CRESTOR) 20 MG tablet TAKE 1 TABLET BY MOUTH AT BEDTIME 30 tablet 2   No current facility-administered medications for this visit.    PHYSICAL EXAMINATION: ECOG PERFORMANCE STATUS: 0 - Asymptomatic  BP (!) 153/94   Pulse 85   Temp 98 F (36.7 C) (Tympanic)   Resp 18   Wt 135 lb 8 oz (61.5 kg)   SpO2 98%   BMI 24.00 kg/m   Filed Weights   02/04/21 1509  Weight: 135 lb 8 oz (61.5 kg)    Physical Exam HENT:     Head: Normocephalic and atraumatic.     Mouth/Throat:     Pharynx: No oropharyngeal exudate.  Eyes:     Pupils: Pupils are equal, round, and reactive to light.  Cardiovascular:     Rate and Rhythm: Normal rate. Rhythm irregular.  Pulmonary:     Effort: Pulmonary effort is normal. No respiratory distress.     Breath sounds: Normal breath sounds. No wheezing.   Abdominal:     General: Bowel sounds are normal. There is no distension.     Palpations: Abdomen is soft. There is no mass.     Tenderness: There is no abdominal tenderness. There is no guarding or rebound.  Musculoskeletal:        General: No tenderness. Normal range of motion.     Cervical back: Normal range of motion and neck supple.  Skin:    General: Skin is warm.  Neurological:     Mental Status: She is alert and oriented to person,  place, and time.  Psychiatric:        Mood and Affect: Affect normal.    LABORATORY DATA:  I have reviewed the data as listed    Component Value Date/Time   NA 135 02/04/2021 1445   NA 138 09/10/2013 0758   K 3.6 02/04/2021 1445   K 3.3 (L) 09/10/2013 0758   CL 100 02/04/2021 1445   CL 100 09/10/2013 0758   CO2 28 02/04/2021 1445   CO2 32 09/10/2013 0758   GLUCOSE 104 (H) 02/04/2021 1445   GLUCOSE 85 09/10/2013 0758   BUN 19 02/04/2021 1445   BUN 13 09/10/2013 0758   CREATININE 1.06 (H) 02/04/2021 1445   CREATININE 0.96 12/22/2013 0857   CALCIUM 8.8 (L) 02/04/2021 1445   CALCIUM 9.2 09/10/2013 0758   PROT 7.2 02/04/2021 1445   PROT 8.3 (H) 09/10/2013 0758   ALBUMIN 4.0 02/04/2021 1445   ALBUMIN 3.5 09/10/2013 0758   AST 22 02/04/2021 1445   AST 23 09/10/2013 0758   ALT 20 02/04/2021 1445   ALT 28 09/10/2013 0758   ALKPHOS 69 02/04/2021 1445   ALKPHOS 78 09/10/2013 0758   BILITOT 0.8 02/04/2021 1445   BILITOT 0.6 09/10/2013 0758   GFRNONAA 53 (L) 02/04/2021 1445   GFRNONAA 59 (L) 12/22/2013 0857   GFRAA >60 01/21/2020 1309   GFRAA 68 12/22/2013 0857    No results found for: SPEP, UPEP  Lab Results  Component Value Date   WBC 9.3 02/04/2021   NEUTROABS 6.6 02/04/2021   HGB 12.0 02/04/2021   HCT 35.4 (L) 02/04/2021   MCV 93.7 02/04/2021   PLT 240 02/04/2021      Chemistry      Component Value Date/Time   NA 135 02/04/2021 1445   NA 138 09/10/2013 0758   K 3.6 02/04/2021 1445   K 3.3 (L) 09/10/2013 0758   CL 100  02/04/2021 1445   CL 100 09/10/2013 0758   CO2 28 02/04/2021 1445   CO2 32 09/10/2013 0758   BUN 19 02/04/2021 1445   BUN 13 09/10/2013 0758   CREATININE 1.06 (H) 02/04/2021 1445   CREATININE 0.96 12/22/2013 0857      Component Value Date/Time   CALCIUM 8.8 (L) 02/04/2021 1445   CALCIUM 9.2 09/10/2013 0758   ALKPHOS 69 02/04/2021 1445   ALKPHOS 78 09/10/2013 0758   AST 22 02/04/2021 1445   AST 23 09/10/2013 0758   ALT 20 02/04/2021 1445   ALT 28 09/10/2013 0758   BILITOT 0.8 02/04/2021 1445   BILITOT 0.6 09/10/2013 0758       RADIOGRAPHIC STUDIES: I have personally reviewed the radiological images as listed and agreed with the findings in the report. No results found.   ASSESSMENT & PLAN:  Follicular lymphoma grade ii, intra-abdominal lymph nodes ([place]) # RETROPERITONEAL LN [incidental/asymptomatic]- follicular lymphoma grade 2; likely stage II; status post rituximab. FEB 2019 PET scan NED;STABLE.   # Clinically no evidence recurrence. will get scans on clinical basis.  # CKD- III-GFR 53-STABLE.  [awaiting appt with nephrology].  # A.fib- on eliquis- STABLE.   # DISPOSITION: # follow up in 6 months-MD/ labs-cbc/cmp,ldh-Dr.B  Cc: Dr.Tullo   No orders of the defined types were placed in this encounter.  All questions were answered. The patient knows to call the clinic with any problems, questions or concerns.      Cammie Sickle, MD 02/04/2021 4:38 PM

## 2021-02-04 NOTE — Assessment & Plan Note (Signed)
#   RETROPERITONEAL LN [incidental/asymptomatic]- follicular lymphoma grade 2; likely stage II; status post rituximab. FEB 2019 PET scan NED;STABLE.   # Clinically no evidence recurrence. will get scans on clinical basis.  # CKD- III-GFR 53-STABLE.  [awaiting appt with nephrology].  # A.fib- on eliquis- STABLE.   # DISPOSITION: # follow up in 6 months-MD/ labs-cbc/cmp,ldh-Dr.B  Cc: Dr.Tullo

## 2021-02-07 ENCOUNTER — Ambulatory Visit: Payer: Medicare Other | Admitting: Cardiology

## 2021-02-07 ENCOUNTER — Other Ambulatory Visit: Payer: Self-pay

## 2021-02-07 ENCOUNTER — Encounter: Payer: Self-pay | Admitting: Cardiology

## 2021-02-07 VITALS — BP 152/90 | HR 106 | Ht 63.0 in | Wt 137.0 lb

## 2021-02-07 DIAGNOSIS — E7800 Pure hypercholesterolemia, unspecified: Secondary | ICD-10-CM

## 2021-02-07 DIAGNOSIS — I4891 Unspecified atrial fibrillation: Secondary | ICD-10-CM

## 2021-02-07 DIAGNOSIS — I10 Essential (primary) hypertension: Secondary | ICD-10-CM

## 2021-02-07 MED ORDER — AMIODARONE HCL 200 MG PO TABS: 200.0000 mg | ORAL_TABLET | Freq: Every day | ORAL | 3 refills | Status: DC

## 2021-02-07 NOTE — Progress Notes (Signed)
Cardiology Office Note:    Date:  02/07/2021   ID:  Whitney Molina, DOB 16-Aug-1940, MRN [id number]  PCP:  [name], MD   [place]  Cardiologist:  [name], MD  Advanced Practice Provider:  No care team member to display Electrophysiologist:  None       Referring MD: [name], MD   Chief Complaint  Patient presents with   OTher    6 month follow up. Patient c/o SOB. Patient states she thinks she is back in Afib. Meds reviewed verbally with patient.     History of Present Illness:    Whitney Molina is a 80 y.o. female with a hx of hypertension, hyperlipidemia, persistent atrial fibrillation s/p DCCV 11/2020 who presents for follow-up.    Previously seen due to fatigue and palpitations.  EKG showed A. fib, DC cardioversion performed 11/2020 successfully.  Patient feeling well, doing okay after cardioversion.  Tolerating Eliquis without any bleeding effects.  Several weeks ago, she was diagnosed with COVID after she had symptoms of cough.  Since then she has noticed being more fatigue, irregular pulse rates.  She thinks she is back in atrial fibrillation.  She is compliant with her Eliquis.   Prior notes Echocardiogram 01/2425 normal systolic function, EF 60 to 65%, normal LA size.  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 left chest   OOPHORECTOMY     TONSILLECTOMY  1971   TUBAL LIGATION  1975   VAGINAL PROLAPSE REPAIR  July 2001   Pelvic Prolapse    Current Medications: Current Meds  Medication Sig   albuterol (VENTOLIN HFA) 108 (90 Base) MCG/ACT inhaler INHALE 1 PUFFS INTO THE LUNGS EVERY 6 HOURS AS NEEDED FOR WHEEZING OR SHORTNESS OF BREATH   amiodarone (PACERONE) 200 MG tablet Take 1 tablet (200 mg total) by mouth daily. Take 2 tablets by mouth twice a day for 1 week, then take 1 tablet by mouth twice a day.   apixaban (ELIQUIS) 5 MG TABS tablet Take 1 tablet (5 mg total) by mouth 2 (two) times daily.   carvedilol (COREG) 6.25 MG tablet TAKE 1 TABLET BY MOUTH TWICE DAILY WITH A MEAL   Cholecalciferol (VITAMIN D3) 1000 units CAPS Take 1,000 Units by mouth daily.   Co-Enzyme Q10 200 MG CAPS Take  1 tablet by mouth. Taking 1 every other day   escitalopram (LEXAPRO) 10 MG tablet Take 1 tablet (10 mg total) by mouth daily.   losartan (COZAAR) 100 MG tablet TAKE 1 TABLET BY MOUTH ONCE DAILY   psyllium (METAMUCIL) 58.6 % packet Take 1 packet by mouth daily.   rosuvastatin (CRESTOR) 20 MG tablet TAKE 1 TABLET BY MOUTH AT BEDTIME     Allergies:   Patient has no known allergies.   Social History   Socioeconomic History   Marital status: Married    Spouse name: Not on file   Number of children: Not on file   Years of education: Not on file   Highest education level: Not on file  Occupational History   Not on file  Tobacco Use   Smoking status: Never   Smokeless tobacco: Never  Vaping Use   Vaping Use: Never used  Substance  and Sexual Activity   Alcohol use: No   Drug use: No   Sexual activity: Yes  Other Topics Concern   Not on file  Social History Narrative   Not on file   Social Determinants of Health   Financial Resource Strain: Low Risk    Difficulty of Paying Living Expenses: Not hard at all  Food Insecurity: No Food Insecurity   Worried About Charity fundraiser in the Last Year: Never true   [place] in the Last Year: Never true  Transportation Needs: No Transportation Needs   Lack of Transportation (Medical): No   Lack of Transportation (Non-Medical): No  Physical Activity: Insufficiently Active   Days of Exercise per Week: 4 days   Minutes of Exercise per Session: 20 min  Stress: No Stress Concern Present   Feeling of Stress : Not at all  Social Connections: Unknown   Frequency of Communication with Friends and Family: Not on file   Frequency of Social Gatherings with Friends and Family: Not on file   Attends Religious Services: Not on Electrical engineer or Organizations: Not on file   Attends Archivist Meetings: Not on file   Marital Status: Married     Family History: The patient's family history includes Bipolar disorder in her son; Breast cancer (age of onset: 88) in her paternal aunt; Breast cancer (age of onset: 63) in her mother; Cancer in her brother; Dementia in her father; Heart attack in her brother; Stroke in her brother, father, and mother.  ROS:   Please see the history of present illness.     All other systems reviewed and are negative.  EKGs/Labs/Other Studies Reviewed:    The following studies were reviewed today:   EKG:  EKG is  ordered today.  EKG shows atrial fibrillation, heart rate 106.   Recent Labs: 11/26/2020: Magnesium 2.2 01/03/2021: TSH 1.02 02/04/2021: ALT 20; BUN 19; Creatinine, Ser 1.06; Hemoglobin 12.0; Platelets 240; Potassium 3.6; Sodium 135  Recent Lipid Panel    Component Value Date/Time   CHOL 152 01/03/2021  0942   TRIG 134.0 01/03/2021 0942   HDL 36.00 (L) 01/03/2021 0942   CHOLHDL 4 01/03/2021 0942   VLDL 26.8 01/03/2021 0942   LDLCALC 89 01/03/2021 0942   LDLDIRECT 145.0 04/01/2018 1116     Risk Assessment/Calculations:      Physical Exam:    VS:  BP (!) 152/90 (BP Location: Left Arm, Patient Position: Sitting, Cuff Size: Normal)   Pulse (!) 106   Ht 5\' 3"  (1.6 m)  Wt 137 lb (62.1 kg)   SpO2 97%   BMI 24.27 kg/m     Wt Readings from Last 3 Encounters:  02/07/21 137 lb (62.1 kg)  02/04/21 135 lb 8 oz (61.5 kg)  01/04/21 138 lb 3.2 oz (62.7 kg)     GEN:  Well nourished, well developed in no acute distress HEENT: Normal NECK: No JVD; No carotid bruits LYMPHATICS: No lymphadenopathy CARDIAC: Irregular irregular, no murmurs. RESPIRATORY:  Clear to auscultation without rales, wheezing or rhonchi  ABDOMEN: Soft, non-tender, non-distended MUSCULOSKELETAL:  No edema; No deformity  SKIN: Warm and dry NEUROLOGIC:  Alert and oriented x 3 PSYCHIATRIC:  Normal affect   ASSESSMENT:    1. Atrial fibrillation, unspecified type ([place])   2. Primary hypertension   3. Pure hypercholesterolemia      PLAN:    In order of problems listed above:  Paroxysmal atrial fibrillation A. fib s/p DC cardioversion 11/2020.  EKG today showing atrial fibrillation.  CHA2DS2-VASc score 4 (age, htn, gender).  Echo with normal EF.  Continue Eliquis, continue Coreg.  Start amiodarone 400 mg twice daily x1 week, decrease to 200 mg twice daily after.  Schedule appointment with EP for additional input. Hypertension, BP elevated.  Coreg, amiodarone as above. Hyperlipidemia, LDL at goal.  Continue Crestor.   Follow-up in 3 months     Medication Adjustments/Labs and Tests Ordered: Current medicines are reviewed at length with the patient today.  Concerns regarding medicines are outlined above.  Orders Placed This Encounter  Procedures   Ambulatory referral to Cardiac Electrophysiology   EKG 12-Lead     Meds ordered this encounter  Medications   amiodarone (PACERONE) 200 MG tablet    Sig: Take 1 tablet (200 mg total) by mouth daily. Take 2 tablets by mouth twice a day for 1 week, then take 1 tablet by mouth twice a day.    Dispense:  70 tablet    Refill:  3      Patient Instructions  Medication Instructions:   Your physician has recommended you make the following change in your medication:    Start taking Amiodarone 200 MG tablets:        Take 2 tablets by mouth twice a day for 1 week, then take 1 tablet by mouth twice a day.   *If you need a refill on your cardiac medications before your next appointment, please call your pharmacy*   Lab Work: None ordered If you have labs (blood work) drawn today and your tests are completely normal, you will receive your results only by: [place] (if you have MyChart) OR A paper copy in the mail If you have any lab test that is abnormal or we need to change your treatment, we will call you to review the results.   Testing/Procedures: None ordered   Follow-Up: At [facility], you and your health needs are our priority.  As part of our continuing mission to provide you with exceptional heart care, we have created designated Provider Care Teams.  These Care Teams include your primary Cardiologist (physician) and Advanced Practice Providers (APPs -  Physician Assistants and Nurse Practitioners) who all work together to provide you with the care you need, when you need it.  We recommend signing up for the patient portal called "MyChart".  Sign up information is provided on this After Visit Summary.  MyChart is used to connect with patients for Virtual Visits (Telemedicine).  Patients are able to view lab/test results, encounter notes, upcoming  appointments, etc.  Non-urgent messages can be sent to your provider as well.   To learn more about what you can do with MyChart, go to [email or web address].    Your next appointment:    3 month(s)  The format for your next appointment:   In Person  Provider:   [name], MD   Other Instructions    Signed, [name], MD  02/07/2021 12:50 PM    [place]

## 2021-02-07 NOTE — Patient Instructions (Signed)
Medication Instructions:   Your physician has recommended you make the following change in your medication:    Start taking Amiodarone 200 MG tablets:        Take 2 tablets by mouth twice a day for 1 week, then take 1 tablet by mouth twice a day.   *If you need a refill on your cardiac medications before your next appointment, please call your pharmacy*   Lab Work: None ordered If you have labs (blood work) drawn today and your tests are completely normal, you will receive your results only by: [place] (if you have MyChart) OR A paper copy in the mail If you have any lab test that is abnormal or we need to change your treatment, we will call you to review the results.   Testing/Procedures: None ordered   Follow-Up: At [facility], you and your health needs are our priority.  As part of our continuing mission to provide you with exceptional heart care, we have created designated Provider Care Teams.  These Care Teams include your primary Cardiologist (physician) and Advanced Practice Providers (APPs -  Physician Assistants and Nurse Practitioners) who all work together to provide you with the care you need, when you need it.  We recommend signing up for the patient portal called "MyChart".  Sign up information is provided on this After Visit Summary.  MyChart is used to connect with patients for Virtual Visits (Telemedicine).  Patients are able to view lab/test results, encounter notes, upcoming appointments, etc.  Non-urgent messages can be sent to your provider as well.   To learn more about what you can do with MyChart, go to [email or web address].    Your next appointment:   3 month(s)  The format for your next appointment:   In Person  Provider:   [name], MD   Other Instructions

## 2021-02-09 ENCOUNTER — Other Ambulatory Visit: Payer: Self-pay | Admitting: Nephrology

## 2021-02-09 DIAGNOSIS — N1832 Chronic kidney disease, stage 3b: Secondary | ICD-10-CM | POA: Diagnosis not present

## 2021-02-09 DIAGNOSIS — R82998 Other abnormal findings in urine: Secondary | ICD-10-CM

## 2021-02-09 DIAGNOSIS — D539 Nutritional anemia, unspecified: Secondary | ICD-10-CM | POA: Diagnosis not present

## 2021-02-09 DIAGNOSIS — R809 Proteinuria, unspecified: Secondary | ICD-10-CM

## 2021-02-09 DIAGNOSIS — I129 Hypertensive chronic kidney disease with stage 1 through stage 4 chronic kidney disease, or unspecified chronic kidney disease: Secondary | ICD-10-CM

## 2021-02-09 DIAGNOSIS — E876 Hypokalemia: Secondary | ICD-10-CM | POA: Diagnosis not present

## 2021-02-09 DIAGNOSIS — R8290 Unspecified abnormal findings in urine: Secondary | ICD-10-CM | POA: Diagnosis not present

## 2021-02-14 ENCOUNTER — Ambulatory Visit: Payer: Medicare Other | Admitting: Cardiology

## 2021-02-22 ENCOUNTER — Ambulatory Visit
Admission: RE | Admit: 2021-02-22 | Discharge: 2021-02-22 | Disposition: A | Discharge status: Home / Self Care | Payer: Medicare Other | Source: Ambulatory Visit | Attending: Nephrology | Admitting: Nephrology

## 2021-02-22 ENCOUNTER — Other Ambulatory Visit: Payer: Self-pay

## 2021-02-22 DIAGNOSIS — R82998 Other abnormal findings in urine: Secondary | ICD-10-CM | POA: Diagnosis not present

## 2021-02-22 DIAGNOSIS — R809 Proteinuria, unspecified: Secondary | ICD-10-CM | POA: Diagnosis not present

## 2021-02-22 DIAGNOSIS — I129 Hypertensive chronic kidney disease with stage 1 through stage 4 chronic kidney disease, or unspecified chronic kidney disease: Secondary | ICD-10-CM | POA: Diagnosis not present

## 2021-02-22 DIAGNOSIS — N1832 Chronic kidney disease, stage 3b: Secondary | ICD-10-CM | POA: Insufficient documentation

## 2021-02-22 DIAGNOSIS — N189 Chronic kidney disease, unspecified: Secondary | ICD-10-CM | POA: Diagnosis not present

## 2021-02-22 DIAGNOSIS — N281 Cyst of kidney, acquired: Secondary | ICD-10-CM | POA: Diagnosis not present

## 2021-02-28 ENCOUNTER — Telehealth: Payer: Self-pay | Admitting: Internal Medicine

## 2021-02-28 NOTE — Telephone Encounter (Signed)
Called to speak with Whitney Molina. [name] stated that she has already given instructions by the Triage nurse and that she has been contacted by her kidney doctor. She is awaiting their call back for a follow up concerning this issue. Pt had no questions or concerns and thanked me for the call.   Providing access nurse documentation. Pt states that she had passed a lot of blood and had some swelling and back/abdominal pain. Pt believes that a water cyst on her kidney had burst.

## 2021-02-28 NOTE — Telephone Encounter (Signed)
Patient called and said she spoke to a triage nurse this morning. Patient thinks she had a cyst that burst, no pain when it happened. When she went to the bathroom on Saturday her urine had blood in it. She was wondering if she can take anything over the counter, she feels a little swollen.

## 2021-03-01 ENCOUNTER — Other Ambulatory Visit: Payer: Self-pay

## 2021-03-01 ENCOUNTER — Ambulatory Visit: Payer: Medicare Other | Admitting: Internal Medicine

## 2021-03-01 ENCOUNTER — Encounter: Payer: Self-pay | Admitting: Internal Medicine

## 2021-03-01 VITALS — BP 172/84 | HR 74 | Temp 96.0°F | Ht 63.0 in | Wt 137.8 lb

## 2021-03-01 DIAGNOSIS — N281 Cyst of kidney, acquired: Secondary | ICD-10-CM | POA: Diagnosis not present

## 2021-03-01 DIAGNOSIS — R319 Hematuria, unspecified: Secondary | ICD-10-CM

## 2021-03-01 DIAGNOSIS — Z87898 Personal history of other specified conditions: Secondary | ICD-10-CM

## 2021-03-01 LAB — POCT URINALYSIS DIPSTICK
Bilirubin, UA: NEGATIVE
Glucose, UA: NEGATIVE
Ketones, UA: NEGATIVE
Nitrite, UA: NEGATIVE
Protein, UA: NEGATIVE
Spec Grav, UA: 1.01 (ref 1.010–1.025)
Urobilinogen, UA: 0.2 E.U./dL
pH, UA: 6 (ref 5.0–8.0)

## 2021-03-01 MED ORDER — CIPROFLOXACIN HCL 250 MG PO TABS: 250.0000 mg | ORAL_TABLET | Freq: Two times a day (BID) | ORAL | 0 refills | Status: AC

## 2021-03-01 NOTE — Progress Notes (Signed)
Electrophysiology Office Note:    Date:  03/02/2021   ID:  Whitney Molina, DOB Apr 08, 1941, MRN [id number]  PCP:  [name], MD  [facility] Cardiologist:  [name], MD  [facility] Electrophysiologist:  Vickie Epley, MD   Referring MD: [name], MD   Chief Complaint: Atrial fibrillation  History of Present Illness:    Whitney Molina is a 80 y.o. female who presents for an evaluation of atrial fibrillation at the request of Dr. Garen Lah. Their medical history includes hypertension, hyperlipidemia, persistent atrial fibrillation, follicular lymphoma, osteoporosis, thrombophilia, CKD 3B..  The patient was last seen by Dr. Garen Lah February 07, 2021.  The patient has been previously cardioverted in July of this year.  She is on Eliquis for stroke prophylaxis.  She had COVID in late August/early September.  When she saw Dr. Garen Lah on September 26 she was back in atrial fibrillation and symptomatic.  At that appointment she was started on amiodarone.  She was seen by Dr. Derrel Nip, her primary care physician, today for hematuria. She tells me that the hematuria is almost completely resolved.  She has a CT scheduled to look at her kidneys.  She is feeling much better now that she is in normal rhythm.  She is in clinic today with her husband.  She keeps a close eye on her blood pressures at home and they have trended down now that she has restarted her home amlodipine and stop the Coreg.    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  2010   CARDIOVERSION N/A 11/30/2020   Procedure: CARDIOVERSION;  Surgeon: [name], MD;  Location: [facility] ORS;  Service: Cardiovascular;  Laterality: N/A;   CHOLECYSTECTOMY  06-01-03   COLONOSCOPY  2003   LITHOTRIPSY  2005   local exicsion skin cancer  2004   squamous cell of chest wall. left chest   OOPHORECTOMY     TONSILLECTOMY  1971   TUBAL LIGATION  1975   VAGINAL PROLAPSE REPAIR  July 2001   Pelvic Prolapse    Current Medications: Current Meds  Medication Sig   albuterol (VENTOLIN HFA) 108 (90 Base) MCG/ACT inhaler INHALE 1 PUFFS INTO THE LUNGS EVERY 6 HOURS AS NEEDED FOR WHEEZING OR SHORTNESS OF BREATH   amiodarone (PACERONE) 200 MG tablet Take 1 tablet (200 mg total) by mouth daily. Take 2 tablets by mouth twice a day for 1 week, then take 1 tablet by mouth twice a day.   apixaban (ELIQUIS) 5 MG TABS tablet Take 1 tablet (5 mg total) by mouth 2 (two) times daily.   Cholecalciferol (VITAMIN D3) 1000 units CAPS Take 1,000 Units by mouth  daily.   ciprofloxacin (CIPRO) 250 MG tablet Take 1 tablet (250 mg total) by mouth 2 (two) times daily for 3 days.   Co-Enzyme Q10 200 MG CAPS Take 1 tablet by mouth. Taking 1 every other day   escitalopram (LEXAPRO) 10 MG tablet Take 1 tablet (10 mg total) by mouth daily.   losartan (COZAAR) 100 MG tablet TAKE 1 TABLET BY MOUTH ONCE DAILY   psyllium (METAMUCIL) 58.6 % packet Take 1 packet by mouth daily.   rosuvastatin (CRESTOR) 20 MG tablet TAKE 1 TABLET BY MOUTH AT BEDTIME     Allergies:   Patient has no known allergies.   Social History   Socioeconomic History   Marital status: Married    Spouse name: Not on file   Number of children: Not on file   Years of  education: Not on file   Highest education level: Not on file  Occupational History   Not on file  Tobacco Use   Smoking status: Never   Smokeless tobacco: Never  Vaping Use   Vaping Use: Never used  Substance and Sexual Activity   Alcohol use: No   Drug use: No   Sexual activity: Yes  Other Topics Concern   Not on file  Social History Narrative   Not on file   Social Determinants of Health   Financial Resource Strain: Low Risk    Difficulty of Paying Living Expenses: Not hard at all  Food Insecurity: No Food Insecurity   Worried About Charity fundraiser in the Last Year: Never true   [place] in the Last Year: Never true  Transportation Needs: No Transportation Needs   Lack of Transportation (Medical): No   Lack of Transportation (Non-Medical): No  Physical Activity: Insufficiently Active   Days of Exercise per Week: 4 days   Minutes of Exercise per Session: 20 min  Stress: No Stress Concern Present   Feeling of Stress : Not at all  Social Connections: Unknown   Frequency of Communication with Friends and Family: Not on file   Frequency of Social Gatherings with Friends and Family: Not on file   Attends Religious Services: Not on Electrical engineer or Organizations: Not on file   Attends Archivist Meetings: Not on file   Marital Status: Married     Family History: The patient's family history includes Bipolar disorder in her son; Breast cancer (age of onset: 27) in her paternal aunt; Breast cancer (age of onset: 52) in her mother; Cancer in her brother; Dementia in her father; Heart attack in her brother; Stroke in her brother, father, and mother.  ROS:   Please see the history of present illness.    All other systems reviewed and are negative.  EKGs/Labs/Other Studies Reviewed:    The following studies were reviewed today:  Home blood pressure log    EKG:  The ekg ordered today demonstrates sinus rhythm   Recent  Labs: 11/26/2020: Magnesium 2.2 01/03/2021: TSH 1.02 02/04/2021: ALT 20; BUN 19; Creatinine, Ser 1.06; Hemoglobin 12.0; Platelets 240; Potassium 3.6; Sodium 135  Recent Lipid Panel    Component Value Date/Time   CHOL 152 01/03/2021 0942   TRIG 134.0 01/03/2021 0942   HDL 36.00 (L) 01/03/2021 0942   CHOLHDL 4 01/03/2021 0942   VLDL 26.8 01/03/2021 0942   LDLCALC 89 01/03/2021 0942   LDLDIRECT 145.0 04/01/2018 1116    Physical Exam:    VS:  BP (!) 148/80 (  BP Location: Left Arm, Patient Position: Sitting, Cuff Size: Normal)   Pulse 63   Ht 5\' 3"  (1.6 m)   Wt 132 lb (59.9 kg)   SpO2 95%   BMI 23.38 kg/m     Wt Readings from Last 3 Encounters:  03/02/21 132 lb (59.9 kg)  03/01/21 137 lb 12.8 oz (62.5 kg)  02/07/21 137 lb (62.1 kg)     GEN:  Well nourished, well developed in no acute distress HEENT: Normal NECK: No JVD; No carotid bruits LYMPHATICS: No lymphadenopathy CARDIAC: RRR, no murmurs, rubs, gallops RESPIRATORY:  Clear to auscultation without rales, wheezing or rhonchi  ABDOMEN: Soft, non-tender, non-distended MUSCULOSKELETAL:  No edema; No deformity  SKIN: Warm and dry NEUROLOGIC:  Alert and oriented x 3 PSYCHIATRIC:  Normal affect       ASSESSMENT:    1. Persistent atrial fibrillation ([place])   2. COPD with chronic bronchitis (HCC)   3. Stage 3b chronic kidney disease ([place])    PLAN:    In order of problems listed above:  #Persistent atrial fibrillation Maintaining normal rhythm on amiodarone 200 mg by mouth once daily.  On Eliquis 5 mg by mouth twice daily for stroke prophylaxis.  For now, I would continue on this regimen.  She has an appointment scheduled with Dr. Garen Lah in December.  At that appointment we should check CMP, TSH and free T4.  She can follow-up with EP as needed.  Given her comorbidities and age, I do not think she is a good candidate for catheter ablation.  #Stage IIIb chronic kidney disease CT scheduled by Dr. Derrel Nip to look at her  kidneys given recent hematuria.  I have encouraged her to continue follow-up with her primary.  Follow-up with EP as needed.   Medication Adjustments/Labs and Tests Ordered: Current medicines are reviewed at length with the patient today.  Concerns regarding medicines are outlined above.  Orders Placed This Encounter  Procedures   EKG 12-Lead   No orders of the defined types were placed in this encounter.    Signed, Hilton Cork. Quentin Ore, MD, [facility], [facility] 03/02/2021 10:17 AM    Electrophysiology [facility]

## 2021-03-01 NOTE — Patient Instructions (Signed)
Your rapid urinalysis was not suggestive of infection so I would not start the antibiotic I sent to pharmacy unless you develop symptoms of a UTI..    The culture will take another 48 hours to confirm   I have ordered a CT scan of your kidneys to evaluate the cyst

## 2021-03-01 NOTE — Progress Notes (Signed)
Subjective:  Patient ID: Whitney Molina, female    DOB: January 18, 1941  Age: 80 y.o. MRN: [id number]  CC: The primary encounter diagnosis was Hematuria, unspecified type. Diagnoses of Renal cyst, acquired, left and History of gross hematuria were also pertinent to this visit.  HPI Whitney Molina presents for  Chief Complaint  Patient presents with   Hematuria    This visit occurred during the SARS-CoV-2 public health emergency.  Safety protocols were in place, including screening questions prior to the visit, additional usage of staff PPE, and extensive cleaning of exam room while observing appropriate contact time as indicated for disinfecting solutions.   80 yr old female with hypertension and CKD  stage 3, atrial fibrillation on Epixiban, , incidental finding of 13 cm exophytic cyst on left kidney during recent renal ultrasound presents with recent history of back pain that occurred on Sunday night,  followed by the passing  of a large amount of old blood during a  urinary void .  Has had no recurrence until today when she noted a light discharge  . She has not had any recurrence of back pain     Outpatient Medications Prior to Visit  Medication Sig Dispense Refill   albuterol (VENTOLIN HFA) 108 (90 Base) MCG/ACT inhaler INHALE 1 PUFFS INTO THE LUNGS EVERY 6 HOURS AS NEEDED FOR WHEEZING OR SHORTNESS OF BREATH 18 g 2   amiodarone (PACERONE) 200 MG tablet Take 1 tablet (200 mg total) by mouth daily. Take 2 tablets by mouth twice a day for 1 week, then take 1 tablet by mouth twice a day. 70 tablet 3   apixaban (ELIQUIS) 5 MG TABS tablet Take 1 tablet (5 mg total) by mouth 2 (two) times daily. 60 tablet 5   Cholecalciferol (VITAMIN D3) 1000 units CAPS Take 1,000 Units by mouth daily.     Co-Enzyme Q10 200 MG CAPS Take 1 tablet by mouth. Taking 1 every other day     escitalopram (LEXAPRO) 10 MG tablet Take 1 tablet (10 mg total) by mouth daily. 90 tablet 1   losartan (COZAAR) 100  MG tablet TAKE 1 TABLET BY MOUTH ONCE DAILY 90 tablet 1   psyllium (METAMUCIL) 58.6 % packet Take 1 packet by mouth daily.     rosuvastatin (CRESTOR) 20 MG tablet TAKE 1 TABLET BY MOUTH AT BEDTIME 30 tablet 2   carvedilol (COREG) 6.25 MG tablet TAKE 1 TABLET BY MOUTH TWICE DAILY WITH A MEAL (Patient not taking: Reported on 03/02/2021) 60 tablet 0   No facility-administered medications prior to visit.    Review of Systems;  Patient denies headache, fevers, malaise, unintentional weight loss, skin rash, eye pain, sinus congestion and sinus pain, sore throat, dysphagia,  hemoptysis , cough, dyspnea, wheezing, chest pain, palpitations, orthopnea, edema, abdominal pain, nausea, melena, diarrhea, constipation, flank pain, dysuria, hematuria, urinary  Frequency, nocturia, numbness, tingling, seizures,  Focal weakness, Loss of consciousness,  Tremor, insomnia, depression, anxiety, and suicidal ideation.      Objective:  BP (!) 172/84 (BP Location: Left Arm, Patient Position: Sitting, Cuff Size: Normal)   Pulse 74   Temp (!) 96 F (35.6 C) (Temporal)   Ht 5\' 3"  (1.6 m)   Wt 137 lb 12.8 oz (62.5 kg)   SpO2 97%   BMI 24.41 kg/m   BP Readings from Last 3 Encounters:  03/02/21 (!) 148/80  03/01/21 (!) 172/84  02/07/21 (!) 152/90    Wt Readings from Last 3 Encounters:  03/02/21 132  lb (59.9 kg)  03/01/21 137 lb 12.8 oz (62.5 kg)  02/07/21 137 lb (62.1 kg)   General Appearance:    Alert, cooperative, no distress, appears stated age  Head:    Normocephalic, without obvious abnormality, atraumatic  Eyes:    PERRL, conjunctiva/corneas clear, EOM's intact, fundi    benign, both eyes  Ears:    Normal TM's and external ear canals, both ears  Nose:   Nares normal, septum midline, mucosa normal, no drainage    or sinus tenderness  Throat:   Lips, mucosa, and tongue normal; teeth and gums normal  Neck:   Supple, symmetrical, trachea midline, no adenopathy;    thyroid:  no  enlargement/tenderness/nodules; no carotid   bruit or JVD  Back:     Symmetric, no curvature, ROM normal, no CVA tenderness  Lungs:     Clear to auscultation bilaterally, respirations unlabored  Chest Wall:    No tenderness or deformity   Heart:    Regular rate and rhythm, S1 and S2 normal, no murmur, rub   or gallop  Breast Exam:    No tenderness, masses, or nipple abnormality  Abdomen:     Soft, non-tender, bowel sounds active all four quadrants,    no masses, no organomegaly  Genitalia:    Pelvic: cervix normal in appearance, external genitalia normal, no adnexal masses or tenderness, no cervical motion tenderness, rectovaginal septum normal, uterus normal size, shape, and consistency and vagina normal without discharge  Extremities:   Extremities normal, atraumatic, no cyanosis or edema  Pulses:   2+ and symmetric all extremities  Skin:   Skin color, texture, turgor normal, no rashes or lesions  Lymph nodes:   Cervical, supraclavicular, and axillary nodes normal  Neurologic:   CNII-XII intact, normal strength, sensation and reflexes    throughout    Lab Results  Component Value Date   HGBA1C 6.3 04/20/2020   HGBA1C 5.9 04/01/2018   HGBA1C 6.0 02/19/2017    Lab Results  Component Value Date   CREATININE 1.06 (H) 02/04/2021   CREATININE 1.11 01/03/2021   CREATININE 1.07 11/26/2020    Lab Results  Component Value Date   WBC 9.3 02/04/2021   HGB 12.0 02/04/2021   HCT 35.4 (L) 02/04/2021   PLT 240 02/04/2021   GLUCOSE 104 (H) 02/04/2021   CHOL 152 01/03/2021   TRIG 134.0 01/03/2021   HDL 36.00 (L) 01/03/2021   LDLDIRECT 145.0 04/01/2018   LDLCALC 89 01/03/2021   ALT 20 02/04/2021   AST 22 02/04/2021   NA 135 02/04/2021   K 3.6 02/04/2021   CL 100 02/04/2021   CREATININE 1.06 (H) 02/04/2021   BUN 19 02/04/2021   CO2 28 02/04/2021   TSH 1.02 01/03/2021   INR 1.02 03/24/2015   HGBA1C 6.3 04/20/2020   MICROALBUR 9.7 (H) 04/20/2020    US RENAL  Result Date:  02/24/2021 CLINICAL DATA:  Chronic kidney disease EXAM: RENAL / URINARY TRACT ULTRASOUND COMPLETE COMPARISON:  None. FINDINGS: Right Kidney: Renal measurements: 10.9 x 4.7 x 4.5 cm = volume: 122 mL. Echogenicity within normal limits. No mass or hydronephrosis visualized. Left Kidney: Renal measurements: 11.7 x 5.9 x 5.0 cm = volume: 181 mL. Echogenicity within normal limits. Large, exophytic cyst of the midportion measuring 12.9 cm. No mass or hydronephrosis visualized. Bladder: Appears normal for degree of bladder distention. Other: None. IMPRESSION: 1. No acute sonographic abnormality of the kidneys or urinary bladder. No hydronephrosis. 2. Large, 12.9 cm left renal cyst, although benign,  possibly symptomatic due to size and mass effect. Electronically Signed   By: Delanna Ahmadi M.D.   On: 02/24/2021 09:54    Assessment & Plan:   Problem List Items Addressed This Visit     History of gross hematuria    Pelvic exam was normal.  No signs of vaginal source. Ruling out UTI.  Renal cyst needing imaging with CT       Other Visit Diagnoses     Hematuria, unspecified type    -  Primary   Relevant Orders   POCT Urinalysis Dipstick (Completed)   Urine Culture   Urine Microscopic Only (Completed)   CT RENAL STONE STUDY   Renal cyst, acquired, left       Relevant Orders   CT RENAL STONE STUDY       I am having Freya A. Alderfer start on ciprofloxacin. I am also having her maintain her Vitamin D3, Co-Enzyme Q10, psyllium, rosuvastatin, escitalopram, albuterol, losartan, apixaban, and amiodarone.  Meds ordered this encounter  Medications   ciprofloxacin (CIPRO) 250 MG tablet    Sig: Take 1 tablet (250 mg total) by mouth 2 (two) times daily for 3 days.    Dispense:  6 tablet    Refill:  0    There are no discontinued medications.  Follow-up: No follow-ups on file.   [name], MD

## 2021-03-02 ENCOUNTER — Encounter: Payer: Self-pay | Admitting: Cardiology

## 2021-03-02 ENCOUNTER — Ambulatory Visit: Payer: Medicare Other | Admitting: Cardiology

## 2021-03-02 VITALS — BP 148/80 | HR 63 | Ht 63.0 in | Wt 132.0 lb

## 2021-03-02 DIAGNOSIS — I4819 Other persistent atrial fibrillation: Secondary | ICD-10-CM | POA: Diagnosis not present

## 2021-03-02 DIAGNOSIS — N1832 Chronic kidney disease, stage 3b: Secondary | ICD-10-CM

## 2021-03-02 DIAGNOSIS — Z87898 Personal history of other specified conditions: Secondary | ICD-10-CM | POA: Insufficient documentation

## 2021-03-02 DIAGNOSIS — J449 Chronic obstructive pulmonary disease, unspecified: Secondary | ICD-10-CM

## 2021-03-02 LAB — URINALYSIS, MICROSCOPIC ONLY

## 2021-03-02 LAB — URINE CULTURE
MICRO NUMBER:: 12517457
Result:: NO GROWTH
SPECIMEN QUALITY:: ADEQUATE

## 2021-03-02 NOTE — Assessment & Plan Note (Addendum)
Pelvic exam was normal.  No signs of vaginal source. Ruling out UTI.  Renal cyst needing imaging with CT

## 2021-03-02 NOTE — Patient Instructions (Addendum)

## 2021-03-15 ENCOUNTER — Other Ambulatory Visit: Payer: Self-pay

## 2021-03-15 ENCOUNTER — Ambulatory Visit
Admission: RE | Admit: 2021-03-15 | Discharge: 2021-03-15 | Disposition: A | Discharge status: Home / Self Care | Payer: Medicare Other | Source: Ambulatory Visit | Attending: Internal Medicine | Admitting: Internal Medicine

## 2021-03-15 DIAGNOSIS — N281 Cyst of kidney, acquired: Secondary | ICD-10-CM | POA: Insufficient documentation

## 2021-03-15 DIAGNOSIS — R319 Hematuria, unspecified: Secondary | ICD-10-CM | POA: Diagnosis not present

## 2021-03-15 DIAGNOSIS — K5730 Diverticulosis of large intestine without perforation or abscess without bleeding: Secondary | ICD-10-CM | POA: Diagnosis not present

## 2021-03-15 DIAGNOSIS — N1330 Unspecified hydronephrosis: Secondary | ICD-10-CM | POA: Diagnosis not present

## 2021-03-15 DIAGNOSIS — Z8572 Personal history of non-Hodgkin lymphomas: Secondary | ICD-10-CM | POA: Diagnosis not present

## 2021-03-23 DIAGNOSIS — E876 Hypokalemia: Secondary | ICD-10-CM | POA: Diagnosis not present

## 2021-03-23 DIAGNOSIS — N1832 Chronic kidney disease, stage 3b: Secondary | ICD-10-CM | POA: Diagnosis not present

## 2021-03-23 DIAGNOSIS — I129 Hypertensive chronic kidney disease with stage 1 through stage 4 chronic kidney disease, or unspecified chronic kidney disease: Secondary | ICD-10-CM | POA: Diagnosis not present

## 2021-03-23 DIAGNOSIS — R809 Proteinuria, unspecified: Secondary | ICD-10-CM | POA: Diagnosis not present

## 2021-03-23 DIAGNOSIS — N2889 Other specified disorders of kidney and ureter: Secondary | ICD-10-CM | POA: Diagnosis not present

## 2021-03-30 ENCOUNTER — Other Ambulatory Visit: Payer: Self-pay

## 2021-03-30 ENCOUNTER — Ambulatory Visit (INDEPENDENT_AMBULATORY_CARE_PROVIDER_SITE_OTHER): Payer: Medicare Other

## 2021-03-30 DIAGNOSIS — Z23 Encounter for immunization: Secondary | ICD-10-CM

## 2021-04-01 ENCOUNTER — Ambulatory Visit: Payer: Medicare Other | Admitting: Medical

## 2021-04-04 NOTE — Progress Notes (Signed)
04/05/21 2:10 PM   Whitney Molina [birth year] [id number]  Referring provider:  Lavonia Dana, MD [phone] Professional [address] [place],  [place] [address] Chief Complaint  Patient presents with   renal cyst     HPI: Whitney Molina is a 80 y.o.female who presents today for further evaluation of left kidney renal cyst.   She was referred by [place]. She was previously followed by Dr. Yves Dill in urology.   She has a personal history of stage IIIB CKD. She is on losartan.   RUS on 02/22/2021 revealed large 12.9 cm left renal cyst.  She has known about this cyst for many years.  CT renal stone study on 03/15/2021 revealed No adrenal nodule bilaterally. No nephrolithiasis and no hydronephrosis. Grossly stable in size 12.8 by 9.3 cm parapelvic left cystic renal lesion with associated 6 mm calcification. Similar-appearing 3.1 cm superior left renal pole cystic lesion. No definite contour-deforming renal mass. No ureterolithiasis or hydroureter. The urinary bladder is unremarkable.  Her renal function over the last few months has fluctuated.   She is accompanied by her husband. She reports that she has been experiencing blood in her urine x 2 months. She would experience pink/dark brown blood.  She states this mostly when she has been physically active.  She denies fevers and chills. She reports that she recently passed a kidney stone.   Never smoker.    Her urine today shows 3-10 RBC otherwise negative    PMH: Past Medical History:  Diagnosis Date   Basal cell carcinoma of nose    Bronchitis    GERD (gastroesophageal reflux disease)    Heart murmur    Hemorrhoids    History of basal cell carcinoma    right upper lip, right ant shoulder   History of colon polyps    History of dysplastic nevus 12/05/2001   left paraspinal upper back, upper back spinal   History of kidney stones    History of mammogram 2016   History of squamous cell  carcinoma 02/07/2005   left ant chest   Hyperlipidemia    Hypertension    Non Hodgkin's lymphoma ([place])    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 left chest   OOPHORECTOMY     TONSILLECTOMY  1971   TUBAL LIGATION  1975   VAGINAL PROLAPSE REPAIR  July 2001   Pelvic Prolapse    Home Medications:  Allergies as of 04/05/2021   No Known Allergies      Medication List        Accurate as of April 05, 2021  2:10 PM. If you have any questions, ask your nurse or doctor.          albuterol 108 (90 Base) MCG/ACT inhaler Commonly known as: VENTOLIN HFA INHALE 1 PUFFS INTO THE LUNGS EVERY 6 HOURS AS NEEDED FOR WHEEZING OR SHORTNESS OF BREATH   amiodarone 200 MG tablet Commonly known as: PACERONE Take 1 tablet (200 mg total) by  mouth daily. Take 2 tablets by mouth twice a day for 1 week, then take 1 tablet by mouth twice a day.   amLODipine 5 MG tablet Commonly known as: NORVASC Take 5 mg by mouth daily.   apixaban 5 MG Tabs tablet Commonly known as: ELIQUIS Take 1 tablet (5 mg total) by mouth 2 (two) times daily.   Co-Enzyme Q10 200 MG Caps Take 1 tablet by mouth. Taking 1 every other day   escitalopram 10 MG tablet Commonly known as: LEXAPRO Take 1 tablet (10 mg total) by mouth daily.   losartan 100 MG tablet Commonly known as: COZAAR TAKE 1 TABLET BY MOUTH ONCE DAILY   psyllium 58.6 % packet Commonly known as: METAMUCIL Take 1 packet by mouth daily.   rosuvastatin 20 MG tablet Commonly known as:  CRESTOR TAKE 1 TABLET BY MOUTH AT BEDTIME   Vitamin D3 25 MCG (1000 UT) Caps Take 1,000 Units by mouth daily.        Allergies: No Known Allergies  Family History: Family History  Problem Relation Age of Onset   Stroke Mother    Breast cancer Mother 49   Stroke Father    Dementia Father    Cancer Brother        bladder   Stroke Brother    Heart attack Brother    Breast cancer Paternal Aunt 70   Bipolar disorder Son     Social History:  reports that she has never smoked. She has never used smokeless tobacco. She reports that she does not drink alcohol and does not use drugs.   Physical Exam: BP (!) 167/83   Pulse 65   Ht 5' 3.5" (1.613 m)   Wt 133 lb (60.3 kg)   BMI 23.19 kg/m   Constitutional:  Alert and oriented, No acute distress.  Calmly by husband today. HEENT: [place] AT, moist mucus membranes.  Trachea midline, no masses. Cardiovascular: No clubbing, cyanosis, or edema. Respiratory: Normal respiratory effort, no increased work of breathing. GU: Left kidney and renal cyst palpable. Skin: No rashes, bruises or suspicious lesions. Neurologic: Grossly intact, no focal deficits, moving all 4 extremities. Psychiatric: Normal mood and affect.  Laboratory Data: Lab Results  Component Value Date   CREATININE 1.06 (H) 02/04/2021    Lab Results  Component Value Date   HGBA1C 6.3 04/20/2020    Pertinent Imaging: CLINICAL DATA:  Hematuria. LLQ pain for a couple weeks and passing some blood. Hx of non-Hodgkin's Lymphoma.   EXAM: CT ABDOMEN AND PELVIS WITHOUT CONTRAST   TECHNIQUE: Multidetector CT imaging of the abdomen and pelvis was performed following the standard protocol without IV contrast.   COMPARISON:  CT renal 03/14/2019.   FINDINGS: Lower chest: Severe atherosclerotic plaque. No acute abnormality. Possible tiny hiatal hernia.   Hepatobiliary: No focal liver abnormality. Status post cholecystectomy. No biliary dilatation.   Pancreas: No focal  lesion. Normal pancreatic contour. No surrounding inflammatory changes. No main pancreatic ductal dilatation.   Spleen: Normal in size without focal abnormality.   Adrenals/Urinary Tract:   No adrenal nodule bilaterally.   No nephrolithiasis and no hydronephrosis. Grossly stable in size 12.8 by 9.3 cm parapelvic left cystic renal lesion with associated 6 mm calcification. Similar-appearing 3.1 cm superior left renal pole cystic lesion. No definite contour-deforming renal mass.   No ureterolithiasis or hydroureter.   The urinary bladder is unremarkable.   Stomach/Bowel: Stomach is within normal limits. No evidence of bowel wall thickening or dilatation. Diffuse sigmoid colon diverticulosis. Likely  fecaliths within the terminal ileum lumen. Appendix appears normal.   Vascular/Lymphatic: No abdominal aorta or iliac aneurysm. Severe atherosclerotic plaque of the aorta and its branches. No abdominal, pelvic, or inguinal lymphadenopathy.   Reproductive: Status post hysterectomy. No adnexal masses.   Other: No intraperitoneal free fluid. No intraperitoneal free gas. No organized fluid collection.   Musculoskeletal:   No abdominal wall hernia or abnormality.   No suspicious lytic or blastic osseous lesions. No acute displaced fracture.   IMPRESSION: 1. Grossly stable in size 12.8 by 9.3 cm parapelvic left cystic renal lesion with associated 6 mm calcification. 2. Stable mild left hydronephrosis. Finding may be related to external compression from the large parapelvic cyst; however, ureteral lesion not excluded. Markedly limited evaluation on this noncontrast study. 3. Colonic diverticulosis with no acute diverticulitis. 4.  Aortic Atherosclerosis (ICD10-I70.0).     Electronically Signed   By: Iven Finn M.D.   On: 03/15/2021 23:33  CT scan personally reviewed, agree with radiologic interpretation.  There is chronic left upper pole hydro nephrosis.  In comparison to  CT scan from 2020, there is been very little change in the degree of hydronephrosis as well as the size of the renal cyst.  Assessment & Plan:    Left Renal cyst  - Chronic, benign but large with mass effect -Asymptomatic from the relatively large cyst as such, would not recommend any intervention for the cystic structure  2. Left hydronephrosis  - Renal function stable given fairly mild  -Stable without atrophy x years  3. Gross/ Microscopic hematuria  - Recommend cystoscopy to further evaluate microscopic hematuria. Consider ct urogram to evaluate for other upper tract issues  -Unlikely that this chronic renal cyst is the cause of her intermittent gross hematuria over the past 2 months given the chronicity of the lesion although could be having some mass-effect on the ureter with irritation.  No other clear etiology at this point in time.  Work-up as above.   Return for cystoscopy   I,Kailey Littlejohn,acting as a scribe for Hollice Espy, MD.,have documented all relevant documentation on the behalf of Hollice Espy, MD,as directed by  Hollice Espy, MD while in the presence of Hollice Espy, MD.  I have reviewed the above documentation for accuracy and completeness, and I agree with the above.   Hollice Espy, MD   [facility] [address] [place], [place] [address] [phone]

## 2021-04-05 ENCOUNTER — Encounter: Payer: Self-pay | Admitting: Urology

## 2021-04-05 ENCOUNTER — Other Ambulatory Visit: Payer: Self-pay

## 2021-04-05 ENCOUNTER — Ambulatory Visit: Payer: Medicare Other | Admitting: Urology

## 2021-04-05 VITALS — BP 167/83 | HR 65 | Ht 63.5 in | Wt 133.0 lb

## 2021-04-05 DIAGNOSIS — R310 Gross hematuria: Secondary | ICD-10-CM | POA: Diagnosis not present

## 2021-04-05 DIAGNOSIS — N281 Cyst of kidney, acquired: Secondary | ICD-10-CM

## 2021-04-05 NOTE — Patient Instructions (Signed)

## 2021-04-06 ENCOUNTER — Encounter: Payer: Self-pay | Admitting: Internal Medicine

## 2021-04-06 ENCOUNTER — Other Ambulatory Visit: Payer: Self-pay

## 2021-04-06 ENCOUNTER — Ambulatory Visit (INDEPENDENT_AMBULATORY_CARE_PROVIDER_SITE_OTHER): Payer: Medicare Other | Admitting: Internal Medicine

## 2021-04-06 VITALS — BP 138/86 | HR 67 | Temp 95.6°F | Ht 63.5 in | Wt 135.2 lb

## 2021-04-06 DIAGNOSIS — N281 Cyst of kidney, acquired: Secondary | ICD-10-CM | POA: Diagnosis not present

## 2021-04-06 DIAGNOSIS — Z8639 Personal history of other endocrine, nutritional and metabolic disease: Secondary | ICD-10-CM | POA: Diagnosis not present

## 2021-04-06 DIAGNOSIS — J479 Bronchiectasis, uncomplicated: Secondary | ICD-10-CM

## 2021-04-06 DIAGNOSIS — I129 Hypertensive chronic kidney disease with stage 1 through stage 4 chronic kidney disease, or unspecified chronic kidney disease: Secondary | ICD-10-CM

## 2021-04-06 DIAGNOSIS — E876 Hypokalemia: Secondary | ICD-10-CM | POA: Diagnosis not present

## 2021-04-06 LAB — URINALYSIS, COMPLETE
Bilirubin, UA: NEGATIVE
Glucose, UA: NEGATIVE
Ketones, UA: NEGATIVE
Leukocytes,UA: NEGATIVE
Nitrite, UA: NEGATIVE
Protein,UA: NEGATIVE
Specific Gravity, UA: <1.005 — ABNORMAL LOW (ref 1.005–1.030)
Urobilinogen, Ur: 0.2 mg/dL (ref 0.2–1.0)
pH, UA: 6 (ref 5.0–7.5)

## 2021-04-06 LAB — MAGNESIUM: Magnesium: 2.1 mg/dL (ref 1.5–2.5)

## 2021-04-06 LAB — BASIC METABOLIC PANEL
BUN: 15 mg/dL (ref 6–23)
CO2: 32 mEq/L (ref 19–32)
Calcium: 9.2 mg/dL (ref 8.4–10.5)
Chloride: 102 mEq/L (ref 96–112)
Creatinine, Ser: 1.04 mg/dL (ref 0.40–1.20)
GFR: 51.05 mL/min — ABNORMAL LOW (ref >60.00)
Glucose, Bld: 83 mg/dL (ref 70–99)
Potassium: 3.3 mEq/L — ABNORMAL LOW (ref 3.5–5.1)
Sodium: 141 mEq/L (ref 135–145)

## 2021-04-06 LAB — MICROSCOPIC EXAMINATION: Bacteria, UA: NONE SEEN

## 2021-04-06 MED ORDER — ZOSTER VAC RECOMB ADJUVANTED 50 MCG/0.5ML IM SUSR: 0.5000 mL | Freq: Once | INTRAMUSCULAR | 1 refills | Status: AC

## 2021-04-06 NOTE — Patient Instructions (Addendum)
You can take zyrtec every day safely  for your post nasal drip  Flushing your sinuses with a saline rinse like NeilMed's rinse may also help  Sleep on  a wedge pillow      The [employer] vaccines will be COVERED BY MEDICARE if you get them at your pharmacy  after January 1

## 2021-04-06 NOTE — Progress Notes (Signed)
Subjective:  Patient ID: Whitney Molina, female    DOB: 1940-07-17  Age: 80 y.o. MRN: [id number]  CC: The primary encounter diagnosis was Hypokalemia. Diagnoses of Bronchiectasis , Renal cyst, Renal hypertension, and H/O hypokalemia were also pertinent to this visit.  HPI Whitney Molina presents for follow up on recent CT scan of abd and pelvis  Chief Complaint  Patient presents with   Follow-up    This visit occurred during the SARS-CoV-2 public health emergency.  Safety protocols were in place, including screening questions prior to the visit, additional usage of staff PPE, and extensive cleaning of exam room while observing appropriate contact time as indicated for disinfecting solutions.   HPI:  patient was referred to Urology for  LLQ pain and  hematuria  and was seen yesterday . Cystoscopy is planned in the near future to rule out bladder CA.. Following that procedure there   will be additional workup of the  12 cm cyst  seen on the left kidney.  Fortunately she is no longer having pain  in the LLQ.  Has not passed any stones in years (over 3) .she is urinating well.    CT images reviewed with patient and husband today   2) PND causing her to clear throat a lot repeatedly,  going on for the past 6 months,  using zyrtec prn and cough drops.   3) Hypertension: patient checks blood pressure tdaily home.  Readings have been for the most part < 140/80 at rest . Patient is following a  prudent  diet most days and is taking medications as prescribed   CKD:  seeing [place],  potassium waas 3.3 last week,  no meds given.    Outpatient Medications Prior to Visit  Medication Sig Dispense Refill   albuterol (VENTOLIN HFA) 108 (90 Base) MCG/ACT inhaler INHALE 1 PUFFS INTO THE LUNGS EVERY 6 HOURS AS NEEDED FOR WHEEZING OR SHORTNESS OF BREATH 18 g 2   amiodarone (PACERONE) 200 MG tablet Take 1 tablet (200 mg total) by mouth daily. Take 2 tablets by mouth twice a day for 1 week, then  take 1 tablet by mouth twice a day. 70 tablet 3   amLODipine (NORVASC) 5 MG tablet Take 5 mg by mouth daily.     apixaban (ELIQUIS) 5 MG TABS tablet Take 1 tablet (5 mg total) by mouth 2 (two) times daily. 60 tablet 5   Cholecalciferol (VITAMIN D3) 1000 units CAPS Take 1,000 Units by mouth daily.     Co-Enzyme Q10 200 MG CAPS Take 1 tablet by mouth. Taking 1 every other day     escitalopram (LEXAPRO) 10 MG tablet Take 1 tablet (10 mg total) by mouth daily. 90 tablet 1   losartan (COZAAR) 100 MG tablet TAKE 1 TABLET BY MOUTH ONCE DAILY 90 tablet 1   psyllium (METAMUCIL) 58.6 % packet Take 1 packet by mouth daily.     rosuvastatin (CRESTOR) 20 MG tablet TAKE 1 TABLET BY MOUTH AT BEDTIME 30 tablet 2   No facility-administered medications prior to visit.    Review of Systems;  Patient denies headache, fevers, malaise, unintentional weight loss, skin rash, eye pain, sinus congestion and sinus pain, sore throat, dysphagia,  hemoptysis , cough, dyspnea, wheezing, chest pain, palpitations, orthopnea, edema, abdominal pain, nausea, melena, diarrhea, constipation, flank pain, dysuria, hematuria, urinary  Frequency, nocturia, numbness, tingling, seizures,  Focal weakness, Loss of consciousness,  Tremor, insomnia, depression, anxiety, and suicidal ideation.      Objective:  BP 138/86   Pulse 67   Temp (!) 95.6 F (35.3 C)   Ht 5' 3.5" (1.613 m)   Wt 135 lb 3.2 oz (61.3 kg)   SpO2 93%   BMI 23.57 kg/m   BP Readings from Last 3 Encounters:  04/06/21 138/86  04/05/21 (!) 167/83  03/02/21 (!) 148/80    Wt Readings from Last 3 Encounters:  04/06/21 135 lb 3.2 oz (61.3 kg)  04/05/21 133 lb (60.3 kg)  03/02/21 132 lb (59.9 kg)    General appearance: alert, cooperative and appears stated age Ears: normal TM's and external ear canals both ears Throat: lips, mucosa, and tongue normal; teeth and gums normal Neck: no adenopathy, no carotid bruit, supple, symmetrical, trachea midline and thyroid  not enlarged, symmetric, no tenderness/mass/nodules Back: symmetric, no curvature. ROM normal. No CVA tenderness. Lungs: clear to auscultation bilaterally Heart: regular rate and rhythm, S1, S2 normal, no murmur, click, rub or gallop Abdomen: soft, non-tender; bowel sounds normal; no masses,  no organomegaly Pulses: 2+ and symmetric Skin: Skin color, texture, turgor normal. No rashes or lesions Lymph nodes: Cervical, supraclavicular, and axillary nodes normal.  Lab Results  Component Value Date   HGBA1C 6.3 04/20/2020   HGBA1C 5.9 04/01/2018   HGBA1C 6.0 02/19/2017    Lab Results  Component Value Date   CREATININE 1.04 04/06/2021   CREATININE 1.06 (H) 02/04/2021   CREATININE 1.11 01/03/2021    Lab Results  Component Value Date   WBC 9.3 02/04/2021   HGB 12.0 02/04/2021   HCT 35.4 (L) 02/04/2021   PLT 240 02/04/2021   GLUCOSE 83 04/06/2021   CHOL 152 01/03/2021   TRIG 134.0 01/03/2021   HDL 36.00 (L) 01/03/2021   LDLDIRECT 145.0 04/01/2018   LDLCALC 89 01/03/2021   ALT 20 02/04/2021   AST 22 02/04/2021   NA 141 04/06/2021   K 3.3 (L) 04/06/2021   CL 102 04/06/2021   CREATININE 1.04 04/06/2021   BUN 15 04/06/2021   CO2 32 04/06/2021   TSH 1.02 01/03/2021   INR 1.02 03/24/2015   HGBA1C 6.3 04/20/2020   MICROALBUR 9.7 (H) 04/20/2020    CT RENAL STONE STUDY  Result Date: 03/15/2021 CLINICAL DATA:  Hematuria. LLQ pain for a couple weeks and passing some blood. Hx of non-Hodgkin's Lymphoma. EXAM: CT ABDOMEN AND PELVIS WITHOUT CONTRAST TECHNIQUE: Multidetector CT imaging of the abdomen and pelvis was performed following the standard protocol without IV contrast. COMPARISON:  CT renal 03/14/2019. FINDINGS: Lower chest: Severe atherosclerotic plaque. No acute abnormality. Possible tiny hiatal hernia. Hepatobiliary: No focal liver abnormality. Status post cholecystectomy. No biliary dilatation. Pancreas: No focal lesion. Normal pancreatic contour. No surrounding inflammatory  changes. No main pancreatic ductal dilatation. Spleen: Normal in size without focal abnormality. Adrenals/Urinary Tract: No adrenal nodule bilaterally. No nephrolithiasis and no hydronephrosis. Grossly stable in size 12.8 by 9.3 cm parapelvic left cystic renal lesion with associated 6 mm calcification. Similar-appearing 3.1 cm superior left renal pole cystic lesion. No definite contour-deforming renal mass. No ureterolithiasis or hydroureter. The urinary bladder is unremarkable. Stomach/Bowel: Stomach is within normal limits. No evidence of bowel wall thickening or dilatation. Diffuse sigmoid colon diverticulosis. Likely fecaliths within the terminal ileum lumen. Appendix appears normal. Vascular/Lymphatic: No abdominal aorta or iliac aneurysm. Severe atherosclerotic plaque of the aorta and its branches. No abdominal, pelvic, or inguinal lymphadenopathy. Reproductive: Status post hysterectomy. No adnexal masses. Other: No intraperitoneal free fluid. No intraperitoneal free gas. No organized fluid collection. Musculoskeletal: No abdominal wall hernia  or abnormality. No suspicious lytic or blastic osseous lesions. No acute displaced fracture. IMPRESSION: 1. Grossly stable in size 12.8 by 9.3 cm parapelvic left cystic renal lesion with associated 6 mm calcification. 2. Stable mild left hydronephrosis. Finding may be related to external compression from the large parapelvic cyst; however, ureteral lesion not excluded. Markedly limited evaluation on this noncontrast study. 3. Colonic diverticulosis with no acute diverticulitis. 4.  Aortic Atherosclerosis (ICD10-I70.0). Electronically Signed   By: Iven Finn M.D.   On: 03/15/2021 23:33    Assessment & Plan:   Problem List Items Addressed This Visit     Bronchiectasis     Unclear if her persistent cough is truly PND or due to er bronchiectasis.  Recommend saline lavages and daily use of 2nd generation antihistamines       Renal hypertension    Improved  control on current regimen of amlodipine, Losartan and metoprolol.    RAS ruled out with renal artery doppler.  No changes today      Renal cyst    She has 2 cysts on the left kidney (3 cm upper lobe  And 12 x 9 cm  Lower pole) that are relatively stable  by repeat CT,  Now 12 cm and space occupying .  Urology to manage after cystoscopy to rule out interstitial cystis, bladder mass       H/O hypokalemia    Level remains slightly low at 3.3 (two weeks ago at nephrologist's but not treated given CKD and concurrent use of ARB) .    Lab Results  Component Value Date   NA 141 04/06/2021   K 3.3 (L) 04/06/2021   CL 102 04/06/2021   CO2 32 04/06/2021         Other Visit Diagnoses     Hypokalemia    -  Primary   Relevant Orders   Basic metabolic panel (Completed)   Magnesium (Completed)       I am having Whitney Molina start on Zoster Vaccine Adjuvanted. I am also having her maintain her Vitamin D3, Co-Enzyme Q10, psyllium, rosuvastatin, escitalopram, albuterol, losartan, apixaban, amiodarone, and amLODipine.  Meds ordered this encounter  Medications   Zoster Vaccine Adjuvanted [facility]) injection    Sig: Inject 0.5 mLs into the muscle once for 1 dose.    Dispense:  1 each    Refill:  1    There are no discontinued medications.  Follow-up: Return in about 6 months (around 10/04/2021).   [name], MD

## 2021-04-08 DIAGNOSIS — Z8639 Personal history of other endocrine, nutritional and metabolic disease: Secondary | ICD-10-CM | POA: Insufficient documentation

## 2021-04-08 DIAGNOSIS — E876 Hypokalemia: Secondary | ICD-10-CM | POA: Insufficient documentation

## 2021-04-08 NOTE — Assessment & Plan Note (Signed)
Improved control on current regimen of amlodipine, Losartan and metoprolol.    RAS ruled out with renal artery doppler.  No changes today

## 2021-04-08 NOTE — Assessment & Plan Note (Addendum)
She has 2 cysts on the left kidney (3 cm upper lobe  And 12 x 9 cm  Lower pole) that are relatively stable  by repeat CT,  Now 12 cm and space occupying .  Urology to manage after cystoscopy to rule out interstitial cystis, bladder mass

## 2021-04-08 NOTE — Assessment & Plan Note (Signed)
Level remains slightly low at 3.3 (two weeks ago at nephrologist's but not treated given CKD and concurrent use of ARB) .    Lab Results  Component Value Date   NA 141 04/06/2021   K 3.3 (L) 04/06/2021   CL 102 04/06/2021   CO2 32 04/06/2021

## 2021-04-08 NOTE — Assessment & Plan Note (Signed)
Unclear if her persistent cough is truly PND or due to er bronchiectasis.  Recommend saline lavages and daily use of 2nd generation antihistamines

## 2021-04-26 ENCOUNTER — Other Ambulatory Visit: Payer: Medicare Other | Admitting: Urology

## 2021-04-26 NOTE — Progress Notes (Signed)
° °  04/27/21  CC:  Chief Complaint  Patient presents with   Cysto     HPI: Whitney Molina is a 80 y.o.female with a personal history of left renal cyst, left hydronephrosis, and gross/microscopic hematuria, who presents today for a cystoscopy.   She was referred by [place]. She was previously followed by Dr. Yves Dill in urology.    She has a personal history of stage IIIB CKD. She is on losartan.    RUS on 02/22/2021 revealed large 12.9 cm left renal cyst.  She has known about this cyst for many years.  CT renal stone study on 03/15/2020 showed a grossly stable in size 12.8 by 9.3 cm parapelvic left cystic renal lesion with associated 6 mm calcification. Similar-appearing 3.1 cm superior left renal pole cystic lesion  UA today showed 30 RBCs.   LLQ pain has resolved.  Vitals:   04/27/21 1453  BP: (!) 146/71  Pulse: 62  NED. A&Ox3.   No respiratory distress   Abd soft, NT, ND Normal external genitalia with patent urethral meatus  Cystoscopy Procedure Note  Patient identification was confirmed, informed consent was obtained, and patient was prepped using Betadine solution.  Lidocaine jelly was administered per urethral meatus.    Procedure: - Flexible cystoscope introduced, without any difficulty.   - Thorough search of the bladder revealed:    normal urethral meatus    normal urothelium    no stones    no ulcers     no tumors    no urethral polyps    no trabeculation  - Ureteral orifices were normal in position and appearance.  Post-Procedure: - Patient tolerated the procedure well  Assessment/ Plan:  Renal cyst  - Will continue to survey cyst given its size and appearance but it is essentially stable   2. Left lower quadrant pain  - resolved - Unlikely related to cyst   3. Microscopic hematuria  - Degree of microscopic hematuria still relative high, >30 today in absence of infection -- Cystoscopy was unremarkable  -  Recommend CT urogram to further evaluate renal cyst and hematuria based on high risk - If CT is reassuring will continue to monitor renal cyst annually.   Return in January with CT urogram   I,Kailey Littlejohn,acting as a scribe for Hollice Espy, MD.,have documented all relevant documentation on the behalf of Hollice Espy, MD,as directed by  Hollice Espy, MD while in the presence of Hollice Espy, MD.  I have reviewed the above documentation for accuracy and completeness, and I agree with the above.   Hollice Espy, MD

## 2021-04-27 ENCOUNTER — Ambulatory Visit: Payer: Medicare Other | Admitting: Urology

## 2021-04-27 ENCOUNTER — Other Ambulatory Visit: Payer: Self-pay

## 2021-04-27 VITALS — BP 146/71 | HR 62 | Ht 63.5 in | Wt 132.0 lb

## 2021-04-27 DIAGNOSIS — R310 Gross hematuria: Secondary | ICD-10-CM

## 2021-04-27 LAB — URINALYSIS, COMPLETE
Bilirubin, UA: NEGATIVE
Glucose, UA: NEGATIVE
Ketones, UA: NEGATIVE
Leukocytes,UA: NEGATIVE
Nitrite, UA: NEGATIVE
Specific Gravity, UA: 1.01 (ref 1.005–1.030)
Urobilinogen, Ur: 1 mg/dL (ref 0.2–1.0)
pH, UA: 6.5 (ref 5.0–7.5)

## 2021-04-27 LAB — MICROSCOPIC EXAMINATION
Bacteria, UA: NONE SEEN
Epithelial Cells (non renal): NONE SEEN /hpf (ref 0–10)
RBC, Urine: >30 /hpf — ABNORMAL HIGH (ref 0–2)

## 2021-04-29 ENCOUNTER — Encounter: Payer: Self-pay | Admitting: Cardiology

## 2021-05-04 ENCOUNTER — Ambulatory Visit
Admission: RE | Admit: 2021-05-04 | Discharge: 2021-05-04 | Disposition: A | Discharge status: Home / Self Care | Payer: Medicare Other | Source: Ambulatory Visit | Attending: Urology | Admitting: Urology

## 2021-05-04 ENCOUNTER — Other Ambulatory Visit: Payer: Medicare Other

## 2021-05-04 DIAGNOSIS — K5730 Diverticulosis of large intestine without perforation or abscess without bleeding: Secondary | ICD-10-CM | POA: Diagnosis not present

## 2021-05-04 DIAGNOSIS — R310 Gross hematuria: Secondary | ICD-10-CM | POA: Diagnosis not present

## 2021-05-04 DIAGNOSIS — I700 Atherosclerosis of aorta: Secondary | ICD-10-CM | POA: Diagnosis not present

## 2021-05-04 DIAGNOSIS — N281 Cyst of kidney, acquired: Secondary | ICD-10-CM | POA: Diagnosis not present

## 2021-05-04 MED ORDER — IOPAMIDOL (ISOVUE-300) INJECTION 61%
125.0000 mL | Freq: Once | INTRAVENOUS | Status: AC | PRN
  Administered 2021-05-04: 15:00:00 125 mL via INTRAVENOUS

## 2021-05-10 ENCOUNTER — Other Ambulatory Visit: Payer: Self-pay

## 2021-05-10 ENCOUNTER — Ambulatory Visit: Payer: Medicare Other | Admitting: Cardiology

## 2021-05-10 ENCOUNTER — Encounter: Payer: Self-pay | Admitting: Cardiology

## 2021-05-10 VITALS — BP 128/70 | HR 60 | Ht 63.5 in | Wt 135.0 lb

## 2021-05-10 DIAGNOSIS — I10 Essential (primary) hypertension: Secondary | ICD-10-CM

## 2021-05-10 DIAGNOSIS — E7800 Pure hypercholesterolemia, unspecified: Secondary | ICD-10-CM

## 2021-05-10 DIAGNOSIS — K2100 Gastro-esophageal reflux disease with esophagitis, without bleeding: Secondary | ICD-10-CM

## 2021-05-10 DIAGNOSIS — I4891 Unspecified atrial fibrillation: Secondary | ICD-10-CM

## 2021-05-10 MED ORDER — OMEPRAZOLE 20 MG PO CPDR
20.0000 mg | DELAYED_RELEASE_CAPSULE | Freq: Every day | ORAL | 11 refills | Status: DC
Start: 2021-05-10 — End: 2021-07-27

## 2021-05-10 NOTE — Progress Notes (Signed)
Cardiology Office Note:    Date:  05/10/2021   ID:  ILLA Molina, DOB 06-05-1940, MRN [id number]  PCP:  Whitney Mc, MD   Whitney Molina  Cardiologist:  Whitney Sable, MD  Advanced Practice Provider:  No care team member to display Electrophysiologist:  Whitney Epley, MD       Referring MD: Whitney Mc, MD   Chief Complaint  Patient presents with   Other    3 Month f/u no complaints today. Meds reviewed verbally with pt.    History of Present Illness:    Whitney Molina is a 80 y.o. female with a hx of hypertension, hyperlipidemia, persistent atrial fibrillation s/p DCCV 11/2020, on amiodarone who presents for follow-up.   Previously seen for failed cardioversion.  Started on amiodarone .  Maintaining sinus rhythm on amiodarone 200 mg daily.  Has a history of hematuria, renal cyst was noted.  Denies palpitations, chest pain.  Takes Eliquis as prescribed.  Checks blood pressure frequently at home, BP controlled.  States having reflux for some time now, wondering if it is okay to take reflux meds.  Prior notes Echocardiogram 06/5850 normal systolic function, EF 60 to 65%, normal LA size. DCCV 11/2020 successful, follow-up in clinic with A. fib, amiodarone started.  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 left chest   OOPHORECTOMY     TONSILLECTOMY  1971   TUBAL LIGATION  1975   VAGINAL PROLAPSE REPAIR  July 2001   Pelvic Prolapse    Current Medications: Current Meds  Medication Sig   albuterol (VENTOLIN HFA) 108 (90 Base) MCG/ACT inhaler INHALE 1 PUFFS INTO THE LUNGS EVERY 6 HOURS AS NEEDED FOR WHEEZING OR SHORTNESS OF BREATH   amiodarone (PACERONE) 200 MG tablet Take 1 tablet (200 mg total) by mouth daily. Take 2 tablets by mouth twice a day for 1 week, then take 1 tablet by mouth twice a day.   amLODipine (NORVASC) 5 MG tablet Take 5 mg by mouth daily.   apixaban (ELIQUIS) 5 MG TABS tablet Take 1 tablet (5 mg total) by mouth 2 (two) times daily.   Cholecalciferol (VITAMIN D3) 1000 units CAPS Take 1,000 Units by mouth daily.   Co-Enzyme Q10 200 MG CAPS Take 1 tablet by mouth. Taking 1 every other day  escitalopram (LEXAPRO) 10 MG tablet Take 1 tablet (10 mg total) by mouth daily.   losartan (COZAAR) 100 MG tablet TAKE 1 TABLET BY MOUTH ONCE DAILY   omeprazole (PRILOSEC) 20 MG capsule Take 1 capsule (20 mg total) by mouth daily.   psyllium (METAMUCIL) 58.6 % packet Take 1 packet by mouth daily.   rosuvastatin (CRESTOR) 20 MG tablet TAKE 1 TABLET BY MOUTH AT BEDTIME     Allergies:   Patient has no known allergies.   Social History   Socioeconomic History   Marital status: Married    Spouse name: Not on file   Number of children: Not on file   Years of education: Not on file   Highest education level: Not on file  Occupational History   Not on file  Tobacco Use   Smoking status: Never   Smokeless tobacco: Never  Vaping Use    Vaping Use: Never used  Substance and Sexual Activity   Alcohol use: No   Drug use: No   Sexual activity: Yes  Other Topics Concern   Not on file  Social History Narrative   Not on file   Social Determinants of Health   Financial Resource Strain: Low Risk    Difficulty of Paying Living Expenses: Not hard at all  Food Insecurity: No Food Insecurity   Worried About Charity fundraiser in the Last Year: Never true   [place] in the Last Year: Never true  Transportation Needs: No Transportation Needs   Lack of Transportation (Medical): No   Lack of Transportation (Non-Medical): No  Physical Activity: Insufficiently Active   Days of Exercise per Week: 4 days   Minutes of Exercise per Session: 20 min  Stress: No Stress Concern Present   Feeling of Stress : Not at all  Social Connections: Unknown   Frequency of Communication with Friends and Family: Not on file   Frequency of Social Gatherings with Friends and Family: Not on file   Attends Religious Services: Not on Electrical engineer or Organizations: Not on file   Attends Archivist Meetings: Not on file   Marital Status: Married     Family History: The patient's family history includes Bipolar disorder in her son; Breast cancer (age of onset: 51) in her paternal aunt; Breast cancer (age of onset: 16) in her mother; Cancer in her brother; Dementia in her father; Heart attack in her brother; Stroke in her brother, father, and mother.  ROS:   Please see the history of present illness.     All other systems reviewed and are negative.  EKGs/Labs/Other Studies Reviewed:    The following studies were reviewed today:   EKG:  EKG is  ordered today.  EKG shows sinus rhythm, heart rate 60   Recent Labs: 01/03/2021: TSH 1.02 02/04/2021: ALT 20; Hemoglobin 12.0; Platelets 240 04/06/2021: BUN 15; Creatinine, Ser 1.04; Magnesium 2.1; Potassium 3.3; Sodium 141  Recent Lipid Panel    Component Value  Date/Time   CHOL 152 01/03/2021 0942   TRIG 134.0 01/03/2021 0942   HDL 36.00 (L) 01/03/2021 0942   CHOLHDL 4 01/03/2021 0942   VLDL 26.8 01/03/2021 0942   LDLCALC 89 01/03/2021 0942   LDLDIRECT 145.0 04/01/2018 1116     Risk Assessment/Calculations:      Physical Exam:    VS:  BP 128/70 (BP Location: Left Arm, Patient Position: Sitting, Cuff Size: Normal)    Pulse 60  Ht 5' 3.5" (1.613 m)    Wt 135 lb (61.2 kg)    SpO2 98%    BMI 23.54 kg/m     Wt Readings from Last 3 Encounters:  05/10/21 135 lb (61.2 kg)  04/27/21 132 lb (59.9 kg)  04/06/21 135 lb 3.2 oz (61.3 kg)     GEN:  Well nourished, well developed in no acute distress HEENT: Normal NECK: No JVD; No carotid bruits LYMPHATICS: No lymphadenopathy CARDIAC: Regular rate and rhythm RESPIRATORY:  Clear to auscultation without rales, wheezing or rhonchi  ABDOMEN: Soft, non-tender, non-distended MUSCULOSKELETAL:  No edema; No deformity  SKIN: Warm and dry NEUROLOGIC:  Alert and oriented x 3 PSYCHIATRIC:  Normal affect   ASSESSMENT:    1. Atrial fibrillation, unspecified type ([place])   2. Primary hypertension   3. Pure hypercholesterolemia   4. Gastroesophageal reflux disease with esophagitis without hemorrhage     PLAN:    In order of problems listed above:  Paroxysmal atrial fibrillation A. fib s/p DC cardioversion 11/2020.  Maintaining sinus rhythm on amiodarone.  Continue amiodarone 200 mg daily.  CHA2DS2-VASc score 4 (age, htn, gender).  Echo with normal EF.  Continue Eliquis.  Check H&H, TSH, free T4. Hypertension, BP controlled.  Losartan, amlodipine continued. Hyperlipidemia, LDL at goal.  Continue Crestor. History of reflux, okay to start OTC Prilosec 20 mg daily.   Follow-up in 6 months     Medication Adjustments/Labs and Tests Ordered: Current medicines are reviewed at length with the patient today.  Concerns regarding medicines are outlined above.  Orders Placed This Encounter  Procedures    CBC   TSH   T4, free   EKG 12-Lead    Meds ordered this encounter  Medications   omeprazole (PRILOSEC) 20 MG capsule    Sig: Take 1 capsule (20 mg total) by mouth daily.    Dispense:  30 capsule    Refill:  11      Patient Instructions  Medication Instructions:  Your physician has recommended you make the following change in your medication:   START OTC Prilosec 20 mg daily   *If you need a refill on your cardiac medications before your next appointment, please call your pharmacy*   Lab Work: Your provider has ordered lab work today (TSH, free T4, CBC). We will draw this in the office before you leave.   If you have labs (blood work) drawn today and your tests are completely normal, you will receive your results only by: [place] (if you have MyChart) OR A paper copy in the mail If you have any lab test that is abnormal or we need to change your treatment, we will call you to review the results.   Testing/Procedures: None ordered   Follow-Up: At [facility], you and your health needs are our priority.  As part of our continuing mission to provide you with exceptional heart care, we have created designated Provider Care Teams.  These Care Teams include your primary Cardiologist (physician) and Advanced Practice Providers (APPs -  Physician Assistants and Nurse Practitioners) who all work together to provide you with the care you need, when you need it.  We recommend signing up for the patient portal called "MyChart".  Sign up information is provided on this After Visit Summary.  MyChart is used to connect with patients for Virtual Visits (Telemedicine).  Patients are able to view lab/test results, encounter notes, upcoming appointments, etc.  Non-urgent messages can be sent to your provider as  well.   To learn more about what you can do with MyChart, go to [email or web address].    Your next appointment:   6 month(s)  The format for your next appointment:    In Person  Provider:   You may see Whitney Sable, MD or one of the following Advanced Practice Providers on your designated Care Team:   Murray Hodgkins, NP Christell Faith, PA-C Cadence Kathlen Mody, [place]    Other Instructions N/A    Signed, Whitney Sable, MD  05/10/2021 9:50 AM    [place]

## 2021-05-10 NOTE — Patient Instructions (Signed)
Medication Instructions:  Your physician has recommended you make the following change in your medication:   START OTC Prilosec 20 mg daily   *If you need a refill on your cardiac medications before your next appointment, please call your pharmacy*   Lab Work: Your provider has ordered lab work today (TSH, free T4, CBC). We will draw this in the office before you leave.   If you have labs (blood work) drawn today and your tests are completely normal, you will receive your results only by: [place] (if you have MyChart) OR A paper copy in the mail If you have any lab test that is abnormal or we need to change your treatment, we will call you to review the results.   Testing/Procedures: None ordered   Follow-Up: At [facility], you and your health needs are our priority.  As part of our continuing mission to provide you with exceptional heart care, we have created designated Provider Care Teams.  These Care Teams include your primary Cardiologist (physician) and Advanced Practice Providers (APPs -  Physician Assistants and Nurse Practitioners) who all work together to provide you with the care you need, when you need it.  We recommend signing up for the patient portal called "MyChart".  Sign up information is provided on this After Visit Summary.  MyChart is used to connect with patients for Virtual Visits (Telemedicine).  Patients are able to view lab/test results, encounter notes, upcoming appointments, etc.  Non-urgent messages can be sent to your provider as well.   To learn more about what you can do with MyChart, go to [email or web address].    Your next appointment:   6 month(s)  The format for your next appointment:   In Person  Provider:   You may see [name], MD or one of the following Advanced Practice Providers on your designated Care Team:   Murray Hodgkins, NP Christell Faith, PA-C Cadence Kathlen Mody, [place]    Other Instructions N/A

## 2021-05-11 LAB — CBC
Hematocrit: 35.6 % (ref 34.0–46.6)
Hemoglobin: 11.8 g/dL (ref 11.1–15.9)
MCH: 31 pg (ref 26.6–33.0)
MCHC: 33.1 g/dL (ref 31.5–35.7)
MCV: 93 fL (ref 79–97)
Platelets: 236 10*3/uL (ref 150–450)
RBC: 3.81 x10E6/uL (ref 3.77–5.28)
RDW: 13.6 % (ref 11.7–15.4)
WBC: 7.6 10*3/uL (ref 3.4–10.8)

## 2021-05-11 LAB — TSH: TSH: 1.21 u[IU]/mL (ref 0.450–4.500)

## 2021-05-11 LAB — T4, FREE: Free T4: 1.53 ng/dL (ref 0.82–1.77)

## 2021-05-31 NOTE — Progress Notes (Signed)
06/01/21 4:15 PM   Whitney Molina 12/07/1940 [id number]  Referring provider:  [name], MD [place],  [place] [address] Chief Complaint  Patient presents with   Hematuria     HPI: Whitney Molina is a 81 y.o.female with a personal history of left renal cyst, left hydronephrosis, and gross/microscopic hematuria, who presents today for follow-up with CT results.   She was referred by [place]. She was previously followed by Dr. Yves Dill in urology.    She has a personal history of stage IIIB CKD. She is on losartan.    RUS on 02/22/2021 revealed large 12.9 cm left renal cyst.  She has known about this cyst for many years.   CT renal stone study on 03/15/2020 showed a grossly stable in size 12.8 by 9.3 cm parapelvic left cystic renal lesion with associated 6 mm calcification. Similar-appearing 3.1 cm superior left renal pole cystic lesion  CT urogram on 05/04/2021 visualized Adrenal glands are unremarkable. There is a redemonstrated very large left parapelvic cyst, measuring at least 13.0 x 9.4 cm. No associated hydronephrosis, and the left ureter is opacified distally. Bladder is unremarkable.  She is accompanied by her husband today.   She is currently on Eliquis.   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 left chest   OOPHORECTOMY     TONSILLECTOMY  1971   TUBAL LIGATION  1975   VAGINAL PROLAPSE REPAIR  July 2001   Pelvic Prolapse    Home Medications:  Allergies as of 06/01/2021   No Known Allergies      Medication List        Accurate as of June 01, 2021  4:15 PM. If you have any questions, ask your nurse or doctor.          albuterol 108 (90 Base) MCG/ACT inhaler Commonly known as: VENTOLIN HFA INHALE 1 PUFFS INTO THE LUNGS EVERY 6 HOURS AS NEEDED FOR WHEEZING OR SHORTNESS OF BREATH   amiodarone 200 MG tablet Commonly known as: PACERONE Take 1 tablet (200 mg total) by mouth daily. Take 2 tablets by mouth twice a day for 1 week, then take 1 tablet by mouth twice a day.   amLODipine 5 MG tablet Commonly known as: NORVASC Take 5 mg by mouth  daily.   apixaban 5 MG Tabs tablet Commonly known as: ELIQUIS Take 1 tablet (5 mg total) by mouth 2 (two) times daily.   Co-Enzyme Q10 200 MG Caps Take 1 tablet by mouth. Taking 1 every other day   escitalopram 10 MG tablet Commonly known as: LEXAPRO Take 1 tablet (10 mg total) by mouth daily.   losartan 100 MG tablet Commonly known as: COZAAR TAKE 1 TABLET BY MOUTH ONCE DAILY   omeprazole 20 MG capsule Commonly known as: PRILOSEC Take 1 capsule (20 mg total) by mouth daily.   psyllium 58.6 % packet Commonly known as: METAMUCIL Take 1 packet by mouth daily.   rosuvastatin 20 MG tablet Commonly known as: CRESTOR TAKE 1 TABLET BY MOUTH AT BEDTIME   Vitamin D3 25 MCG (1000 UT) Caps Take 1,000 Units by mouth  daily.        Allergies: No Known Allergies  Family History: Family History  Problem Relation Age of Onset   Stroke Mother    Breast cancer Mother 29   Stroke Father    Dementia Father    Cancer Brother        bladder   Stroke Brother    Heart attack Brother    Breast cancer Paternal Aunt 65   Bipolar disorder Son     Social History:  reports that she has never smoked. She has never used smokeless tobacco. She reports that she does not drink alcohol and does not use drugs.   Physical Exam: BP (!) 148/74    Pulse 64    Ht 5' 3.5" (1.613 m)    Wt 135 lb (61.2 kg)    BMI 23.54 kg/m   Constitutional:  Alert and oriented, No acute distress. HEENT: [place] AT, moist mucus membranes.  Trachea midline, no masses. Cardiovascular: No clubbing, cyanosis, or edema. Respiratory: Normal respiratory effort, no increased work of breathing. Skin: No rashes, bruises or suspicious lesions. Neurologic: Grossly intact, no focal deficits, moving all 4 extremities. Psychiatric: Normal mood and affect.  Laboratory Data:  Lab Results  Component Value Date   CREATININE 1.04 04/06/2021   Lab Results  Component Value Date   HGBA1C 6.3 04/20/2020   Pertinent Imaging: CLINICAL DATA:  Left lower quadrant pain, gross hematuria   EXAM: CT ABDOMEN AND PELVIS WITHOUT AND WITH CONTRAST   TECHNIQUE: Multidetector CT imaging of the abdomen and pelvis was performed following the standard protocol before and following the bolus administration of intravenous contrast.   CONTRAST:  164mL ISOVUE-300 IOPAMIDOL (ISOVUE-300) INJECTION 61%   COMPARISON:  03/15/2021   FINDINGS: Lower chest: No acute abnormality.   Hepatobiliary: No focal liver abnormality is seen. Status post cholecystectomy. No biliary dilatation.   Pancreas: Unremarkable. No pancreatic ductal dilatation or surrounding inflammatory changes.   Spleen: Normal in size without significant abnormality.   Adrenals/Urinary Tract:  Adrenal glands are unremarkable. There is a redemonstrated very large left parapelvic cyst, measuring at least 13.0 x 9.4 cm. No associated hydronephrosis, and the left ureter is opacified distally. Bladder is unremarkable.   Stomach/Bowel: Stomach is within normal limits. Appendix is not clearly visualized and may be surgically absent. No evidence of bowel wall thickening, distention, or inflammatory changes. Descending and sigmoid diverticulosis. Moderate burden of stool throughout the colon and rectum.   Vascular/Lymphatic: Aortic atherosclerosis. No enlarged abdominal or pelvic lymph nodes.   Reproductive: Status post hysterectomy.   Other: No abdominal wall hernia or abnormality. No abdominopelvic ascites.   Musculoskeletal: No acute or significant  osseous findings.   IMPRESSION: 1. There is a redemonstrated very large left parapelvic cyst, measuring at least 13.0 x 9.4 cm. No associated hydronephrosis, and the left ureter is opacified distally. Although benign, this may be symptomatic due to very large size and mass effect. 2. No urinary tract calculi, mass, or filling defect on delayed phase imaging. 3. Descending and sigmoid diverticulosis without evidence of acute diverticulitis.   Aortic Atherosclerosis (ICD10-I70.0).     Electronically Signed   By: Delanna Ahmadi M.D.   On: 05/05/2021 09:43   I have personally reviewed the images however on further review of the delayed imaging, it appears that the ureter deviates and the cyst in question is likely a large space-occupying parapelvic cyst.  The calcification dependently at the cyst in fact appears mobile along the course of the renal pelvis/proximal ureter and likely represents a ureteral/UPJ calculus rather than a calculus within the parapelvic cyst.   Assessment & Plan:   Left Ureteral stone  - Upon personal review of CT I believe she has a ureteral stone based review of the course of the ureter rather than a  fairly unusual dependent calcification within the cyst -We discussed that it may be the source of her discomfort as well as hematuria -We discussed various treatment options for urolithiasis including observation with or without medical expulsive therapy, shockwave lithotripsy (SWL), ureteroscopy and laser lithotripsy with stent placement, and percutaneous nephrolithotomy.   We discussed that management is based on stone size, location, density, patient co-morbidities, and patient preference.    SWL has a lower stone free rate in a single procedure, but also a lower complication rate compared to ureteroscopy and avoids a stent and associated stent related symptoms. Possible complications include renal hematoma, steinstrasse, and need for additional treatment. We discussed the role of his increased skin to stone distance can lead to decreased efficacy with shockwave lithotripsy.   Ureteroscopy with laser lithotripsy and stent placement has a higher stone free rate than SWL in a single procedure, however increased complication rate including possible infection, ureteral injury, bleeding, and stent related morbidity. Common stent related symptoms include dysuria, urgency/frequency, and flank pain.   After an extensive discussion of the risks and benefits of the above treatment options, the patient would like to proceed with ESWL - Discussed the possibility of cyst rupturing during procedure and contingency plan if that fails. She may need ureteroscopy with drainage of the cyst prior  - Need clearance from cardiologist to stop her Eliquis  -preop urine culture today  Return for ESWL in 2 weeks  I,Kailey Littlejohn,acting as a scribe for Hollice Espy, MD.,have documented all relevant documentation on the behalf of Hollice Espy, MD,as directed by  Hollice Espy, MD while in the presence of Hollice Espy, MD.  I have reviewed the above documentation for accuracy and completeness, and I agree with  the above.   Hollice Espy, MD   [facility] [address], [place], [place] [address] [phone]

## 2021-05-31 NOTE — H&P (View-Only) (Signed)
06/01/21 4:15 PM   Whitney Molina 02-09-1941 [id number]  Referring provider:  [name], MD [place],  [place] [address] Chief Complaint  Patient presents with   Hematuria     HPI: Whitney Molina is a 81 y.o.female with a personal history of left renal cyst, left hydronephrosis, and gross/microscopic hematuria, who presents today for follow-up with CT results.   She was referred by [place]. She was previously followed by Dr. Yves Dill in urology.    She has a personal history of stage IIIB CKD. She is on losartan.    RUS on 02/22/2021 revealed large 12.9 cm left renal cyst.  She has known about this cyst for many years.   CT renal stone study on 03/15/2020 showed a grossly stable in size 12.8 by 9.3 cm parapelvic left cystic renal lesion with associated 6 mm calcification. Similar-appearing 3.1 cm superior left renal pole cystic lesion  CT urogram on 05/04/2021 visualized Adrenal glands are unremarkable. There is a redemonstrated very large left parapelvic cyst, measuring at least 13.0 x 9.4 cm. No associated hydronephrosis, and the left ureter is opacified distally. Bladder is unremarkable.  She is accompanied by her husband today.   She is currently on Eliquis.   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 left chest   OOPHORECTOMY     TONSILLECTOMY  1971   TUBAL LIGATION  1975   VAGINAL PROLAPSE REPAIR  July 2001   Pelvic Prolapse    Home Medications:  Allergies as of 06/01/2021   No Known Allergies      Medication List        Accurate as of June 01, 2021  4:15 PM. If you have any questions, ask your nurse or doctor.          albuterol 108 (90 Base) MCG/ACT inhaler Commonly known as: VENTOLIN HFA INHALE 1 PUFFS INTO THE LUNGS EVERY 6 HOURS AS NEEDED FOR WHEEZING OR SHORTNESS OF BREATH   amiodarone 200 MG tablet Commonly known as: PACERONE Take 1 tablet (200 mg total) by mouth daily. Take 2 tablets by mouth twice a day for 1 week, then take 1 tablet by mouth twice a day.   amLODipine 5 MG tablet Commonly known as: NORVASC Take 5 mg by mouth  daily.   apixaban 5 MG Tabs tablet Commonly known as: ELIQUIS Take 1 tablet (5 mg total) by mouth 2 (two) times daily.   Co-Enzyme Q10 200 MG Caps Take 1 tablet by mouth. Taking 1 every other day   escitalopram 10 MG tablet Commonly known as: LEXAPRO Take 1 tablet (10 mg total) by mouth daily.   losartan 100 MG tablet Commonly known as: COZAAR TAKE 1 TABLET BY MOUTH ONCE DAILY   omeprazole 20 MG capsule Commonly known as: PRILOSEC Take 1 capsule (20 mg total) by mouth daily.   psyllium 58.6 % packet Commonly known as: METAMUCIL Take 1 packet by mouth daily.   rosuvastatin 20 MG tablet Commonly known as: CRESTOR TAKE 1 TABLET BY MOUTH AT BEDTIME   Vitamin D3 25 MCG (1000 UT) Caps Take 1,000 Units by mouth  daily.        Allergies: No Known Allergies  Family History: Family History  Problem Relation Age of Onset   Stroke Mother    Breast cancer Mother 70   Stroke Father    Dementia Father    Cancer Brother        bladder   Stroke Brother    Heart attack Brother    Breast cancer Paternal Aunt 61   Bipolar disorder Son     Social History:  reports that she has never smoked. She has never used smokeless tobacco. She reports that she does not drink alcohol and does not use drugs.   Physical Exam: BP (!) 148/74    Pulse 64    Ht 5' 3.5" (1.613 m)    Wt 135 lb (61.2 kg)    BMI 23.54 kg/m   Constitutional:  Alert and oriented, No acute distress. HEENT: [place] AT, moist mucus membranes.  Trachea midline, no masses. Cardiovascular: No clubbing, cyanosis, or edema. Respiratory: Normal respiratory effort, no increased work of breathing. Skin: No rashes, bruises or suspicious lesions. Neurologic: Grossly intact, no focal deficits, moving all 4 extremities. Psychiatric: Normal mood and affect.  Laboratory Data:  Lab Results  Component Value Date   CREATININE 1.04 04/06/2021   Lab Results  Component Value Date   HGBA1C 6.3 04/20/2020   Pertinent Imaging: CLINICAL DATA:  Left lower quadrant pain, gross hematuria   EXAM: CT ABDOMEN AND PELVIS WITHOUT AND WITH CONTRAST   TECHNIQUE: Multidetector CT imaging of the abdomen and pelvis was performed following the standard protocol before and following the bolus administration of intravenous contrast.   CONTRAST:  161mL ISOVUE-300 IOPAMIDOL (ISOVUE-300) INJECTION 61%   COMPARISON:  03/15/2021   FINDINGS: Lower chest: No acute abnormality.   Hepatobiliary: No focal liver abnormality is seen. Status post cholecystectomy. No biliary dilatation.   Pancreas: Unremarkable. No pancreatic ductal dilatation or surrounding inflammatory changes.   Spleen: Normal in size without significant abnormality.   Adrenals/Urinary Tract:  Adrenal glands are unremarkable. There is a redemonstrated very large left parapelvic cyst, measuring at least 13.0 x 9.4 cm. No associated hydronephrosis, and the left ureter is opacified distally. Bladder is unremarkable.   Stomach/Bowel: Stomach is within normal limits. Appendix is not clearly visualized and may be surgically absent. No evidence of bowel wall thickening, distention, or inflammatory changes. Descending and sigmoid diverticulosis. Moderate burden of stool throughout the colon and rectum.   Vascular/Lymphatic: Aortic atherosclerosis. No enlarged abdominal or pelvic lymph nodes.   Reproductive: Status post hysterectomy.   Other: No abdominal wall hernia or abnormality. No abdominopelvic ascites.   Musculoskeletal: No acute or significant  osseous findings.   IMPRESSION: 1. There is a redemonstrated very large left parapelvic cyst, measuring at least 13.0 x 9.4 cm. No associated hydronephrosis, and the left ureter is opacified distally. Although benign, this may be symptomatic due to very large size and mass effect. 2. No urinary tract calculi, mass, or filling defect on delayed phase imaging. 3. Descending and sigmoid diverticulosis without evidence of acute diverticulitis.   Aortic Atherosclerosis (ICD10-I70.0).     Electronically Signed   By: Delanna Ahmadi M.D.   On: 05/05/2021 09:43   I have personally reviewed the images however on further review of the delayed imaging, it appears that the ureter deviates and the cyst in question is likely a large space-occupying parapelvic cyst.  The calcification dependently at the cyst in fact appears mobile along the course of the renal pelvis/proximal ureter and likely represents a ureteral/UPJ calculus rather than a calculus within the parapelvic cyst.   Assessment & Plan:   Left Ureteral stone  - Upon personal review of CT I believe she has a ureteral stone based review of the course of the ureter rather than a  fairly unusual dependent calcification within the cyst -We discussed that it may be the source of her discomfort as well as hematuria -We discussed various treatment options for urolithiasis including observation with or without medical expulsive therapy, shockwave lithotripsy (SWL), ureteroscopy and laser lithotripsy with stent placement, and percutaneous nephrolithotomy.   We discussed that management is based on stone size, location, density, patient co-morbidities, and patient preference.    SWL has a lower stone free rate in a single procedure, but also a lower complication rate compared to ureteroscopy and avoids a stent and associated stent related symptoms. Possible complications include renal hematoma, steinstrasse, and need for additional treatment. We discussed the role of his increased skin to stone distance can lead to decreased efficacy with shockwave lithotripsy.   Ureteroscopy with laser lithotripsy and stent placement has a higher stone free rate than SWL in a single procedure, however increased complication rate including possible infection, ureteral injury, bleeding, and stent related morbidity. Common stent related symptoms include dysuria, urgency/frequency, and flank pain.   After an extensive discussion of the risks and benefits of the above treatment options, the patient would like to proceed with ESWL - Discussed the possibility of cyst rupturing during procedure and contingency plan if that fails. She may need ureteroscopy with drainage of the cyst prior  - Need clearance from cardiologist to stop her Eliquis  -preop urine culture today  Return for ESWL in 2 weeks  I,Kailey Littlejohn,acting as a scribe for Hollice Espy, MD.,have documented all relevant documentation on the behalf of Hollice Espy, MD,as directed by  Hollice Espy, MD while in the presence of Hollice Espy, MD.  I have reviewed the above documentation for accuracy and completeness, and I agree with  the above.   Hollice Espy, MD   [facility] [address], [place], [place] [address] [phone]

## 2021-06-01 ENCOUNTER — Other Ambulatory Visit: Payer: Self-pay

## 2021-06-01 ENCOUNTER — Ambulatory Visit: Payer: Medicare Other | Admitting: Urology

## 2021-06-01 VITALS — BP 148/74 | HR 64 | Ht 63.5 in | Wt 135.0 lb

## 2021-06-01 DIAGNOSIS — N200 Calculus of kidney: Secondary | ICD-10-CM | POA: Diagnosis not present

## 2021-06-01 NOTE — Patient Instructions (Signed)
Lithotripsy Lithotripsy is a treatment that can help break up kidney stones that are too large to pass on their own. This is a nonsurgical procedure that crushes a kidney stone with shock waves. These shock waves pass through your body and focus on the kidney stone. They cause the kidney stone to break up into smaller pieces while it is still in the urinary tract. The smaller pieces of stone can pass more easily out of your body in the urine. Tell a health care provider about: Any allergies you have. All medicines you are taking, including vitamins, herbs, eye drops, creams, and over-the-counter medicines. Any problems you or family members have had with anesthetic medicines. Any blood disorders you have. Any surgeries you have had. Any medical conditions you have. Whether you are pregnant or may be pregnant. What are the risks? Generally, this is a safe procedure. However, problems may occur, including: Infection. Bleeding from the kidney. Bruising of the kidney or skin. Scarring of the kidney, which can lead to: Increased blood pressure. Poor kidney function. Return (recurrence) of kidney stones. Damage to other structures or organs, such as the liver, colon, spleen, or pancreas. Blockage (obstruction) of the tube that carries urine from the kidney to the bladder (ureter). Failure of the kidney stone to break into pieces (fragments). What happens before the procedure? Staying hydrated Follow instructions from your health care provider about hydration, which may include: Up to 2 hours before the procedure - you may continue to drink clear liquids, such as water, clear fruit juice, black coffee, and plain tea. Eating and drinking restrictions Follow instructions from your health care provider about eating and drinking, which may include: 8 hours before the procedure - stop eating heavy meals or foods, such as meat, fried foods, or fatty foods. 6 hours before the procedure - stop eating  light meals or foods, such as toast or cereal. 6 hours before the procedure - stop drinking milk or drinks that contain milk. 2 hours before the procedure - stop drinking clear liquids. Medicines Ask your health care provider about: Changing or stopping your regular medicines. This is especially important if you are taking diabetes medicines or blood thinners. Taking medicines such as aspirin and ibuprofen. These medicines can thin your blood. Do not take these medicines unless your health care provider tells you to take them. Taking over-the-counter medicines, vitamins, herbs, and supplements. Tests You may have tests, such as: Blood tests. Urine tests. Imaging tests, such as a CT scan. General instructions Plan to have someone take you home from the hospital or clinic. If you will be going home right after the procedure, plan to have someone with you for 24 hours. Ask your health care provider what steps will be taken to help prevent infection. These may include washing skin with a germ-killing soap. What happens during the procedure?  An IV will be inserted into one of your veins. You will be given one or more of the following: A medicine to help you relax (sedative). A medicine to make you fall asleep (general anesthetic). A water-filled cushion may be placed behind your kidney or on your abdomen. In some cases, you may be placed in a tub of lukewarm water. Your body will be positioned in a way that makes it easy to target the kidney stone. An X-ray or ultrasound exam will be done to locate your stone. Shock waves will be aimed at the stone. If you are awake, you may feel a tapping sensation as  the shock waves pass through your body. A flexible tube with holes in it (stent) may be placed in the ureter. This will help keep urine flowing from the kidney if the fragments of the stone have been blocking the ureter. The procedure may vary among health care providers and hospitals. What  happens after the procedure? You may have an X-ray to see whether the procedure was able to break up the kidney stone and how much of the stone has passed. If large stone fragments remain after treatment, you may need to have a second procedure at a later time. Your blood pressure, heart rate, breathing rate, and blood oxygen level will be monitored until you leave the hospital or clinic. You may be given antibiotics or pain medicine as needed. If a stent was placed in your ureter during surgery, it may stay in place for a few weeks. You may need to strain your urine to collect pieces of the kidney stone for testing. You will need to drink plenty of water. If you were given a sedative during the procedure, it can affect you for several hours. Do not drive or operate machinery until your health care provider says that it is safe. Summary Lithotripsy is a treatment that can help break up kidney stones that are too large to pass on their own. Lithotripsy is a nonsurgical procedure that crushes a kidney stone with shock waves. Generally, this is a safe procedure. However, problems may occur, including damage to the kidney or other organs, infection, or obstruction of the tube that carries urine from the kidney to the bladder (ureter). You may have a stent placed in your ureter to help drain your urine. This stent may stay in place for a few weeks. After the procedure, you will need to drink plenty of water. You may be asked to strain your urine to collect pieces of the kidney stone for testing. This information is not intended to replace advice given to you by your health care provider. Make sure you discuss any questions you have with your health care provider. Document Revised: 02/11/2019 Document Reviewed: 02/12/2019 Elsevier Patient Education  [place].

## 2021-06-02 ENCOUNTER — Telehealth: Payer: Self-pay | Admitting: Urology

## 2021-06-02 NOTE — Telephone Encounter (Signed)
ESWL ORDER FORM  Expected date of procedure: 06/16/21  Surgeon:  Marshia Ly  Post op standing: 2-4wk follow up w/KUB prior  Anticoagulation/Aspirin/NSAID standing order: Hold all 72 hours prior- cardiac clearance to hold  Anesthesia standing order: MAC  VTE standing: SCD's  Dx: Left Nephrolithiasis  Procedure: left Extracorporeal shock wave lithotripsy  CPT : 83254  Standing Order Set:   *NPO after mn, KUB  *NS 163m/hr, Keflex 5064mPO, Benadryl 2559mO, Valium 14m54m, Zofran 4mg 24m   Medications if other than standing orders:   NONE

## 2021-06-03 ENCOUNTER — Other Ambulatory Visit: Payer: Self-pay

## 2021-06-03 DIAGNOSIS — N200 Calculus of kidney: Secondary | ICD-10-CM

## 2021-06-03 NOTE — Telephone Encounter (Signed)
I spoke with Whitney Molina and Husband Chrissie Noa in office. We have discussed possible surgery dates and Thursday February 2nf, 2023 was agreed upon by all parties. Patient given information about surgery date, what to expect pre-operatively and post operatively.   We discussed that a Pre-Admission Testing office will be calling to set up the pre-op visit that will take place prior to surgery, and that these appointments are typically done over the phone with a Pre-Admissions RN.   Informed patient that our office will communicate any additional care to be provided after surgery. Patients questions or concerns were discussed during our call. Advised to call our office should there be any additional information, questions or concerns that arise. Patient verbalized understanding.

## 2021-06-05 LAB — CULTURE, URINE COMPREHENSIVE

## 2021-06-07 ENCOUNTER — Other Ambulatory Visit: Payer: Self-pay | Admitting: Internal Medicine

## 2021-06-16 ENCOUNTER — Ambulatory Visit
Admission: RE | Admit: 2021-06-16 | Discharge: 2021-06-16 | Disposition: A | Discharge status: Home / Self Care | Payer: Medicare Other | Attending: Urology | Admitting: Urology

## 2021-06-16 ENCOUNTER — Other Ambulatory Visit: Payer: Self-pay

## 2021-06-16 ENCOUNTER — Ambulatory Visit
Admission: RE | Admit: 2021-06-16 | Discharge: 2021-06-16 | Disposition: A | Discharge status: Home / Self Care | Payer: Medicare Other | Source: Ambulatory Visit | Attending: Urology | Admitting: Urology

## 2021-06-16 ENCOUNTER — Encounter
Admission: RE | Disposition: A | Discharge status: Home / Self Care | Payer: Self-pay | Source: Home / Self Care | Attending: Urology

## 2021-06-16 ENCOUNTER — Encounter: Payer: Self-pay | Admitting: Urology

## 2021-06-16 DIAGNOSIS — I129 Hypertensive chronic kidney disease with stage 1 through stage 4 chronic kidney disease, or unspecified chronic kidney disease: Secondary | ICD-10-CM | POA: Insufficient documentation

## 2021-06-16 DIAGNOSIS — I499 Cardiac arrhythmia, unspecified: Secondary | ICD-10-CM | POA: Insufficient documentation

## 2021-06-16 DIAGNOSIS — N1832 Chronic kidney disease, stage 3b: Secondary | ICD-10-CM | POA: Diagnosis not present

## 2021-06-16 DIAGNOSIS — I878 Other specified disorders of veins: Secondary | ICD-10-CM | POA: Diagnosis not present

## 2021-06-16 DIAGNOSIS — Z79899 Other long term (current) drug therapy: Secondary | ICD-10-CM | POA: Insufficient documentation

## 2021-06-16 DIAGNOSIS — N281 Cyst of kidney, acquired: Secondary | ICD-10-CM | POA: Diagnosis not present

## 2021-06-16 DIAGNOSIS — N200 Calculus of kidney: Secondary | ICD-10-CM | POA: Diagnosis not present

## 2021-06-16 DIAGNOSIS — Z87442 Personal history of urinary calculi: Secondary | ICD-10-CM | POA: Insufficient documentation

## 2021-06-16 DIAGNOSIS — Z7901 Long term (current) use of anticoagulants: Secondary | ICD-10-CM | POA: Insufficient documentation

## 2021-06-16 DIAGNOSIS — Z9049 Acquired absence of other specified parts of digestive tract: Secondary | ICD-10-CM | POA: Diagnosis not present

## 2021-06-16 DIAGNOSIS — N201 Calculus of ureter: Secondary | ICD-10-CM | POA: Diagnosis not present

## 2021-06-16 HISTORY — PX: EXTRACORPOREAL SHOCK WAVE LITHOTRIPSY: SHX1557

## 2021-06-16 SURGERY — LITHOTRIPSY, ESWL
Anesthesia: Monitor Anesthesia Care | Laterality: Left

## 2021-06-16 MED ORDER — HYDROCODONE-ACETAMINOPHEN 5-325 MG PO TABS: 1.0000 | ORAL_TABLET | Freq: Four times a day (QID) | ORAL | 0 refills | Status: DC | PRN

## 2021-06-16 MED ORDER — DIPHENHYDRAMINE HCL 25 MG PO CAPS
ORAL_CAPSULE | ORAL | Status: AC
  Administered 2021-06-16: 25 mg via ORAL
  Filled 2021-06-16: qty 1

## 2021-06-16 MED ORDER — DIAZEPAM 5 MG PO TABS
ORAL_TABLET | ORAL | Status: AC
  Administered 2021-06-16: 10 mg via ORAL
  Filled 2021-06-16: qty 2

## 2021-06-16 MED ORDER — CEPHALEXIN 500 MG PO CAPS
ORAL_CAPSULE | ORAL | Status: AC
  Administered 2021-06-16: 500 mg via ORAL
  Filled 2021-06-16: qty 1

## 2021-06-16 MED ORDER — CEPHALEXIN 500 MG PO CAPS: 500.0000 mg | ORAL_CAPSULE | Freq: Once | ORAL | Status: AC

## 2021-06-16 MED ORDER — DIPHENHYDRAMINE HCL 25 MG PO CAPS: 25.0000 mg | ORAL_CAPSULE | ORAL | Status: AC

## 2021-06-16 MED ORDER — DIAZEPAM 5 MG PO TABS: 10.0000 mg | ORAL_TABLET | ORAL | Status: AC

## 2021-06-16 MED ORDER — SODIUM CHLORIDE 0.9 % IV SOLN: INTRAVENOUS | Status: DC

## 2021-06-16 MED ORDER — ONDANSETRON HCL 4 MG/2ML IJ SOLN: 4.0000 mg | Freq: Once | INTRAMUSCULAR | Status: AC

## 2021-06-16 MED ORDER — TAMSULOSIN HCL 0.4 MG PO CAPS: 0.4000 mg | ORAL_CAPSULE | Freq: Every day | ORAL | 0 refills | Status: DC

## 2021-06-16 MED ORDER — ONDANSETRON HCL 4 MG/2ML IJ SOLN
INTRAMUSCULAR | Status: AC
  Administered 2021-06-16: 4 mg via INTRAVENOUS
  Filled 2021-06-16: qty 2

## 2021-06-16 MED ORDER — DOCUSATE SODIUM 100 MG PO CAPS: 100.0000 mg | ORAL_CAPSULE | Freq: Two times a day (BID) | ORAL | 0 refills | Status: DC

## 2021-06-16 NOTE — Discharge Instructions (Addendum)
See [facility] discharge instructions in chart. AMBULATORY SURGERY  DISCHARGE INSTRUCTIONS   The drugs that you were given will stay in your system until tomorrow so for the next 24 hours you should not:  Drive an automobile Make any legal decisions Drink any alcoholic beverage   You may resume regular meals tomorrow.  Today it is better to start with liquids and gradually work up to solid foods.  You may eat anything you prefer, but it is better to start with liquids, then soup and crackers, and gradually work up to solid foods.   Please notify your doctor immediately if you have any unusual bleeding, trouble breathing, redness and pain at the surgery site, drainage, fever, or pain not relieved by medication.    Additional Instructions:        Please contact your physician with any problems or Same Day Surgery at [phone], Monday through Friday 6 am to 4 pm, or [place] [facility] [place] Main number at [phone].  

## 2021-06-16 NOTE — Interval H&P Note (Signed)
History and Physical Interval Note:  06/16/2021 7:39 AM  Whitney Molina  has presented today for surgery, with the diagnosis of stone.  The various methods of treatment have been discussed with the patient and family. After consideration of risks, benefits and other options for treatment, the patient has consented to  Procedure(s): EXTRACORPOREAL SHOCK WAVE LITHOTRIPSY (ESWL) (Left) as a surgical intervention.  The patient's history has been reviewed, patient examined, no change in status, stable for surgery.  I have reviewed the patient's chart and labs.  Questions were answered to the patient's satisfaction.     Hollice Espy

## 2021-06-17 ENCOUNTER — Encounter: Payer: Self-pay | Admitting: Urology

## 2021-07-06 NOTE — Progress Notes (Signed)
07/07/2021 6:38 PM   Whitney Molina 10-01-1940 [id number]  Referring provider: [name], MD [place] Whitney Molina,  Whitney Molina [id number]  Chief Complaint  Patient presents with   Nephrolithiasis   Urological history: 1.  Left renal cyst -CTU 04/2021 - very large left parapelvic cyst, measuring at least 13.0 x 9.4 cm and a 3 cm left upper pole cyst  2. High risk hematuria -non-smoker -CTU 04/2021 -renal cysts -cysto 04/2021 - NED   3.  Nephrolithiasis -ESWL 06/16/2021  HPI: Whitney Molina is a 81 y.o. who is status post ESWL who presents today for follow up.  Underwent ESWL on 06/16/2021 for a left UPJ stone with Dr. Erlene Molina.  Their postprocedural course was as expected and uneventful.   They have not passed fragments.     KUB stool overlying the left renal shadow and left lower quadrant.    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   removed at [facility]    Surgical History: Past Surgical History:  Procedure Laterality Date   ABDOMINAL HYSTERECTOMY  1984   BLADDER SUSPENSION  2010   CARDIOVERSION N/A 11/30/2020   Procedure: CARDIOVERSION;  Surgeon: Whitney Sable, MD;  Location: [facility] ORS;  Service: Cardiovascular;  Laterality: N/A;   CHOLECYSTECTOMY  06-01-03   COLONOSCOPY  2003   EXTRACORPOREAL SHOCK WAVE LITHOTRIPSY Left 06/16/2021   Procedure:  EXTRACORPOREAL SHOCK WAVE LITHOTRIPSY (ESWL);  Surgeon: Whitney Espy, MD;  Location: [facility] ORS;  Service: Urology;  Laterality: Left;   LITHOTRIPSY  2005   local exicsion skin cancer  2004   squamous cell of chest wall. left chest   OOPHORECTOMY     TONSILLECTOMY  1971   TUBAL LIGATION  1975   VAGINAL PROLAPSE REPAIR  July 2001   Pelvic Prolapse    Home Medications:  Current Outpatient Medications on File Prior to Visit  Medication Sig Dispense Refill   albuterol (VENTOLIN HFA) 108 (90 Base) MCG/ACT inhaler INHALE 1 PUFFS INTO THE LUNGS EVERY 6 HOURS AS NEEDED FOR WHEEZING OR SHORTNESS OF BREATH 18 g 2   amiodarone (PACERONE) 200 MG tablet Take 1 tablet (200 mg total) by mouth daily. Take 2 tablets by mouth twice a day for 1 week, then take 1 tablet by mouth twice a day. 70 tablet 3   amLODipine (NORVASC) 5 MG tablet Take 5 mg by mouth daily.     apixaban (ELIQUIS) 5 MG TABS tablet Take 1 tablet (5 mg total) by mouth 2 (two) times daily. 60 tablet 5   Cholecalciferol (VITAMIN D3) 1000 units CAPS Take 1,000 Units by mouth daily.     Co-Enzyme Q10 200 MG CAPS Take 1 tablet by mouth. Taking 1 every other day     docusate sodium (COLACE)  100 MG capsule Take 1 capsule (100 mg total) by mouth 2 (two) times daily. 60 capsule 0   escitalopram (LEXAPRO) 10 MG tablet TAKE 1 TABLET BY MOUTH ONCE DAILY 90 tablet 1   losartan (COZAAR) 100 MG tablet TAKE 1 TABLET BY MOUTH ONCE DAILY 90 tablet 1   omeprazole (PRILOSEC) 20 MG capsule Take 1 capsule (20 mg total) by mouth daily. 30 capsule 11   psyllium (METAMUCIL) 58.6 % packet Take 1 packet by mouth daily.     rosuvastatin (CRESTOR) 20 MG tablet TAKE 1 TABLET BY MOUTH AT BEDTIME 30 tablet 2   HYDROcodone-acetaminophen (NORCO/VICODIN) 5-325 MG tablet Take 1-2 tablets by mouth every 6 (six) hours as needed for moderate pain. (Patient not taking: Reported on 07/07/2021) 10 tablet 0   tamsulosin (FLOMAX) 0.4 MG CAPS capsule Take 1 capsule (0.4 mg total) by  mouth daily. (Patient not taking: Reported on 07/07/2021) 30 capsule 0   No current facility-administered medications on file prior to visit.    Allergies: No Known Allergies  Family History: Family History  Problem Relation Age of Onset   Stroke Mother    Breast cancer Mother 45   Stroke Father    Dementia Father    Cancer Brother        bladder   Stroke Brother    Heart attack Brother    Breast cancer Paternal Aunt 49   Bipolar disorder Son     Social History:  reports that she has never smoked. She has never used smokeless tobacco. She reports that she does not drink alcohol and does not use drugs.  ROS: Pertinent ROS in HPI  Physical Exam: BP 138/70    Pulse 61    Ht 5\' 3"  (1.6 m)    Wt 133 lb (60.3 kg)    BMI 23.56 kg/m   Constitutional:  Well nourished. Alert and oriented, No acute distress. HEENT: [place] AT, mask in place trachea midline Cardiovascular: No clubbing, cyanosis, or edema. Respiratory: Normal respiratory effort, no increased work of breathing. Neurologic: Grossly intact, no focal deficits, moving all 4 extremities. Psychiatric: Normal mood and affect.    Laboratory Data: Lab Results  Component Value Date   WBC 7.6 05/10/2021   HGB 11.8 05/10/2021   HCT 35.6 05/10/2021   MCV 93 05/10/2021   PLT 236 05/10/2021    Lab Results  Component Value Date   CREATININE 1.04 04/06/2021       Component Value Date/Time   CHOL 152 01/03/2021 0942   HDL 36.00 (L) 01/03/2021 0942   CHOLHDL 4 01/03/2021 0942   VLDL 26.8 01/03/2021 0942   LDLCALC 89 01/03/2021 0942    Urinalysis Component     Latest Ref Rng & Units 07/07/2021  Specific Gravity, UA     1.005 - 1.030 <1.005 (L)  pH, UA     5.0 - 7.5 6.0  Color, UA     Yellow Yellow  Appearance Ur     Clear Clear  Leukocytes,UA     Negative Trace (A)  Protein,UA     Negative/Trace Negative  Glucose, UA     Negative Negative  Ketones, UA     Negative Negative  RBC, UA     Negative Trace (A)   Bilirubin, UA     Negative Negative  Urobilinogen, Ur     0.2 - 1.0 mg/dL 0.2  Nitrite, UA     Negative Negative  Microscopic Examination      See below:  Component     Latest Ref Rng & Units 07/07/2021  WBC, UA     0 - 5 /hpf 0-5  RBC     0 - 2 /hpf None seen  Epithelial Cells (non renal)     0 - 10 /hpf 0-10  Bacteria, UA     None seen/Few None seen  I have reviewed the labs.   Pertinent Imaging: CLINICAL DATA:  Status post lithotripsy, left nephrolithiasis.   EXAM: ABDOMEN - 1 Molina   COMPARISON:  Radiograph 06/16/2021, CT 05/04/2021   FINDINGS: Previous left-sided stone is not definitively seen, no definite stone or stone fragments along the course of the ureter. Stable distribution of pelvic phleboliths. No visualized right renal calculi. Soft tissue fullness projecting over the lower medial left renal shadow corresponds to a large cyst on prior CT. Cholecystectomy clips in the right upper quadrant. Normal bowel gas pattern with moderate colonic stool burden.   IMPRESSION: Previous left-sided stone is not definitively seen. No residual, no definite stone or stone fragments.     Electronically Signed   By: Keith Rake M.D.   On: 07/09/2021 22:50 I have independently reviewed the films.    Assessment & Plan:    1. Left ureteral stone -stone not seen on today's KUB, but there is stool in her LLQ potentially hiding any ureteral fragments -She will take MiraLAX daily 1 week prior to her follow-up KUB  Return in about 2 weeks (around 07/21/2021) for KUB and office visit .  These notes generated with voice recognition software. I apologize for typographical errors.  Zara Council, PA-C  [facility] [address]  [place], [place] [address] [phone]

## 2021-07-07 ENCOUNTER — Ambulatory Visit
Admission: RE | Admit: 2021-07-07 | Discharge: 2021-07-07 | Disposition: A | Discharge status: Home / Self Care | Payer: Medicare Other | Source: Ambulatory Visit | Attending: Urology | Admitting: Urology

## 2021-07-07 ENCOUNTER — Other Ambulatory Visit: Payer: Self-pay

## 2021-07-07 ENCOUNTER — Ambulatory Visit
Admission: RE | Admit: 2021-07-07 | Discharge: 2021-07-07 | Disposition: A | Discharge status: Home / Self Care | Payer: Medicare Other | Attending: Urology | Admitting: Urology

## 2021-07-07 ENCOUNTER — Ambulatory Visit (INDEPENDENT_AMBULATORY_CARE_PROVIDER_SITE_OTHER): Payer: Medicare Other | Admitting: Urology

## 2021-07-07 ENCOUNTER — Encounter: Payer: Self-pay | Admitting: Urology

## 2021-07-07 VITALS — BP 138/70 | HR 61 | Ht 63.0 in | Wt 133.0 lb

## 2021-07-07 DIAGNOSIS — Z87442 Personal history of urinary calculi: Secondary | ICD-10-CM | POA: Diagnosis not present

## 2021-07-07 DIAGNOSIS — I878 Other specified disorders of veins: Secondary | ICD-10-CM | POA: Diagnosis not present

## 2021-07-07 DIAGNOSIS — Z9049 Acquired absence of other specified parts of digestive tract: Secondary | ICD-10-CM | POA: Diagnosis not present

## 2021-07-07 DIAGNOSIS — N200 Calculus of kidney: Secondary | ICD-10-CM

## 2021-07-07 DIAGNOSIS — N281 Cyst of kidney, acquired: Secondary | ICD-10-CM | POA: Diagnosis not present

## 2021-07-07 LAB — MICROSCOPIC EXAMINATION
Bacteria, UA: NONE SEEN
RBC, Urine: NONE SEEN /hpf (ref 0–2)

## 2021-07-07 LAB — URINALYSIS, COMPLETE
Bilirubin, UA: NEGATIVE
Glucose, UA: NEGATIVE
Ketones, UA: NEGATIVE
Nitrite, UA: NEGATIVE
Protein,UA: NEGATIVE
Specific Gravity, UA: <1.005 — ABNORMAL LOW (ref 1.005–1.030)
Urobilinogen, Ur: 0.2 mg/dL (ref 0.2–1.0)
pH, UA: 6 (ref 5.0–7.5)

## 2021-07-11 ENCOUNTER — Other Ambulatory Visit: Payer: Self-pay | Admitting: Internal Medicine

## 2021-07-25 ENCOUNTER — Other Ambulatory Visit: Payer: Self-pay | Admitting: Internal Medicine

## 2021-07-27 ENCOUNTER — Other Ambulatory Visit: Payer: Self-pay

## 2021-07-27 ENCOUNTER — Ambulatory Visit
Admission: RE | Admit: 2021-07-27 | Discharge: 2021-07-27 | Disposition: A | Discharge status: Home / Self Care | Payer: Medicare Other | Attending: Urology | Admitting: Urology

## 2021-07-27 ENCOUNTER — Other Ambulatory Visit: Payer: Self-pay | Admitting: *Deleted

## 2021-07-27 ENCOUNTER — Ambulatory Visit
Admission: RE | Admit: 2021-07-27 | Discharge: 2021-07-27 | Disposition: A | Discharge status: Home / Self Care | Payer: Medicare Other | Source: Ambulatory Visit | Attending: Urology | Admitting: Urology

## 2021-07-27 ENCOUNTER — Encounter: Payer: Self-pay | Admitting: Urology

## 2021-07-27 ENCOUNTER — Ambulatory Visit: Payer: Medicare Other | Admitting: Urology

## 2021-07-27 VITALS — BP 163/72 | HR 73 | Ht 63.0 in | Wt 132.0 lb

## 2021-07-27 DIAGNOSIS — N200 Calculus of kidney: Secondary | ICD-10-CM

## 2021-07-27 DIAGNOSIS — C8213 Follicular lymphoma grade II, intra-abdominal lymph nodes: Secondary | ICD-10-CM

## 2021-07-27 DIAGNOSIS — I878 Other specified disorders of veins: Secondary | ICD-10-CM | POA: Diagnosis not present

## 2021-07-27 MED ORDER — APIXABAN 5 MG PO TABS: 5.0000 mg | ORAL_TABLET | Freq: Two times a day (BID) | ORAL | 5 refills | Status: DC

## 2021-07-27 NOTE — Telephone Encounter (Signed)
Prescription refill request for Eliquis received. ?Indication: Afib  ?Last office visit: 05/10/21 ([place]) ?Scr: 1.04 (04/06/21) ?Age: 81 ?Weight: 60.3kg ? ?Appropriate dose and refill sent to requested pharmacy.  ?

## 2021-07-27 NOTE — Progress Notes (Signed)
07/27/2021 ?11:03 AM  ? ?Whitney Molina ?1941/05/07 ?[id number] ? ?Referring provider: [name], MD ?[address] ?[address] ?[place],  [place] [address] ? ?Chief Complaint  ?Patient presents with  ? Nephrolithiasis  ? ?Urological history: ?1.  Left renal cyst ?-CTU 04/2021 - very large left parapelvic cyst, measuring at least 13.0 x 9.4 cm and a 3 cm left upper pole cyst ? ?2. High risk hematuria ?-non-smoker ?-CTU 04/2021 -renal cysts ?-cysto 04/2021 - NED  ? ?3.  Nephrolithiasis ?-ESWL 06/16/2021 ? ?HPI: ?Whitney Molina is a 81 y.o. who is status post ESWL who presents today for follow up. ? ?Underwent ESWL on 06/16/2021 for a left UPJ stone with Dr. Erlene Quan.  Their postprocedural course was as expected and uneventful.   They have passed fragments and bring then in today for analysis.   ? ?KUB UPJ stone is no longer visible. ? ?PMH: ?Past Medical History:  ?Diagnosis Date  ? Basal cell carcinoma of nose   ? Bronchitis   ? GERD (gastroesophageal reflux disease)   ? Heart murmur   ? Hemorrhoids   ? History of basal cell carcinoma   ? right upper lip, right ant shoulder  ? History of colon polyps   ? History of dysplastic nevus 12/05/2001  ? left paraspinal upper back, upper back spinal  ? History of kidney stones   ? History of mammogram 2016  ? History of squamous cell carcinoma 02/07/2005  ? left ant chest  ? Hyperlipidemia   ? Hypertension   ? Non Hodgkin's lymphoma ([place])   ? Primary squamous cell carcinoma of chest wall [facility]) 2004  ? removed at [place]  ? ? ?Surgical History: ?Past Surgical History:  ?Procedure Laterality Date  ? ABDOMINAL HYSTERECTOMY  1984  ? BLADDER SUSPENSION  2010  ? CARDIOVERSION N/A 11/30/2020  ? Procedure: CARDIOVERSION;  Surgeon: [name], MD;  Location: [facility] ORS;  Service: Cardiovascular;  Laterality: N/A;  ? CHOLECYSTECTOMY  06-01-03  ? COLONOSCOPY  2003  ? EXTRACORPOREAL SHOCK WAVE LITHOTRIPSY Left 06/16/2021  ? Procedure: EXTRACORPOREAL SHOCK WAVE LITHOTRIPSY  (ESWL);  Surgeon: Hollice Espy, MD;  Location: [facility] ORS;  Service: Urology;  Laterality: Left;  ? LITHOTRIPSY  2005  ? local exicsion skin cancer  2004  ? squamous cell of chest wall. left chest  ? OOPHORECTOMY    ? TONSILLECTOMY  1971  ? TUBAL LIGATION  1975  ? VAGINAL PROLAPSE REPAIR  July 2001  ? Pelvic Prolapse  ? ? ?Home Medications:  ?Current Outpatient Medications on File Prior to Visit  ?Medication Sig Dispense Refill  ? albuterol (VENTOLIN HFA) 108 (90 Base) MCG/ACT inhaler INHALE 1 PUFFS INTO THE LUNGS EVERY 6 HOURS AS NEEDED FOR WHEEZING OR SHORTNESS OF BREATH 18 g 2  ? amiodarone (PACERONE) 200 MG tablet Take 1 tablet (200 mg total) by mouth daily. Take 2 tablets by mouth twice a day for 1 week, then take 1 tablet by mouth twice a day. 70 tablet 3  ? amLODipine (NORVASC) 5 MG tablet Take 5 mg by mouth daily.    ? Cholecalciferol (VITAMIN D3) 1000 units CAPS Take 1,000 Units by mouth daily.    ? Co-Enzyme Q10 200 MG CAPS Take 1 tablet by mouth. Taking 1 every other day    ? docusate sodium (COLACE) 100 MG capsule Take 1 capsule (100 mg total) by mouth 2 (two) times daily. 60 capsule 0  ? escitalopram (LEXAPRO) 10 MG tablet TAKE 1 TABLET BY MOUTH ONCE  DAILY 90 tablet 1  ? losartan (COZAAR) 100 MG tablet TAKE 1 TABLET BY MOUTH ONCE DAILY 90 tablet 1  ? psyllium (METAMUCIL) 58.6 % packet Take 1 packet by mouth daily.    ? rosuvastatin (CRESTOR) 20 MG tablet TAKE 1 TABLET BY MOUTH AT BEDTIME 30 tablet 2  ? ?No current facility-administered medications on file prior to visit.  ? ? ?Allergies: No Known Allergies ? ?Family History: ?Family History  ?Problem Relation Age of Onset  ? Stroke Mother   ? Breast cancer Mother 36  ? Stroke Father   ? Dementia Father   ? Cancer Brother   ?     bladder  ? Stroke Brother   ? Heart attack Brother   ? Breast cancer Paternal Aunt 46  ? Bipolar disorder Son   ? ? ?Social History:  reports that she has never smoked. She has never used smokeless tobacco. She reports that she  does not drink alcohol and does not use drugs. ? ?ROS: ?Pertinent ROS in HPI ? ?Physical Exam: ?BP (!) 163/72   Pulse 73   Ht '5\' 3"'$  (1.6 m)   Wt 132 lb (59.9 kg)   BMI 23.38 kg/m?   ?Constitutional:  Well nourished. Alert and oriented, No acute distress. ?HEENT: [place] AT, mask in place  Trachea midline ?Cardiovascular: No clubbing, cyanosis, or edema. ?Respiratory: Normal respiratory effort, no increased work of breathing. ?Neurologic: Grossly intact, no focal deficits, moving all 4 extremities. ?Psychiatric: Normal mood and affect.   ? ?Laboratory Data: ?N/A ? ? ?Pertinent Imaging: ?CLINICAL DATA:  Kidney stones.  No symptoms. ?  ?EXAM: ?ABDOMEN - 1 VIEW ?  ?COMPARISON:  February 23rd 2023 ?  ?FINDINGS: ?The right kidney is largely obscured by bowel contents. No definite ?right renal or ureteral stones. ?  ?Subtle density projected over the left mid abdomen could be in the ?medial lower left kidney or the proximal ureter. No other left-sided ?renal stones identified. No other ureteral stones identified on the ?left. Phleboliths remain in the pelvis. ?  ?IMPRESSION: ?1. The right kidney is largely obscured but no stones are noted. ?2. A subtle rounded density measuring approximately 3 mm in the left ?mid abdomen could be in the medial lower pole of the left kidney ?versus in the proximal left ureter. A CT scan could better evaluate ?this region. ?  ?  ?Electronically Signed ?  By: Dorise Bullion III M.D. ?  On: 07/29/2021 15:24 ?  ?I have independently reviewed the films.   ? ?Assessment & Plan:   ? ?1. Left ureteropelvic junction stone ?-stone not seen on today's KUB ?-fragments sent for analysis ? ?Return in about 6 months (around 01/27/2022) for KUB and symptom recheck . ? ?These notes generated with voice recognition software. I apologize for typographical errors. ? ?Assyria Morreale, PA-C ? ?[employer] ?[place], [place] [address] ?[phone]  ?

## 2021-08-01 ENCOUNTER — Other Ambulatory Visit: Payer: Self-pay | Admitting: Internal Medicine

## 2021-08-01 LAB — CALCULI, WITH PHOTOGRAPH (CLINICAL LAB)
Calcium Oxalate Monohydrate: 95 %
Hydroxyapatite: 5 %
Weight Calculi: 11 mg

## 2021-08-05 ENCOUNTER — Other Ambulatory Visit: Payer: Medicare Other

## 2021-08-05 ENCOUNTER — Ambulatory Visit: Payer: Medicare Other | Admitting: Internal Medicine

## 2021-08-16 DIAGNOSIS — E876 Hypokalemia: Secondary | ICD-10-CM | POA: Diagnosis not present

## 2021-08-16 DIAGNOSIS — R809 Proteinuria, unspecified: Secondary | ICD-10-CM | POA: Diagnosis not present

## 2021-08-16 DIAGNOSIS — N182 Chronic kidney disease, stage 2 (mild): Secondary | ICD-10-CM | POA: Diagnosis not present

## 2021-08-16 DIAGNOSIS — I10 Essential (primary) hypertension: Secondary | ICD-10-CM | POA: Diagnosis not present

## 2021-08-26 ENCOUNTER — Inpatient Hospital Stay: Payer: Medicare Other | Admitting: Internal Medicine

## 2021-08-26 ENCOUNTER — Inpatient Hospital Stay: Payer: Medicare Other | Attending: Internal Medicine

## 2021-08-26 ENCOUNTER — Encounter: Payer: Self-pay | Admitting: Internal Medicine

## 2021-08-26 DIAGNOSIS — C8213 Follicular lymphoma grade II, intra-abdominal lymph nodes: Secondary | ICD-10-CM | POA: Insufficient documentation

## 2021-08-26 LAB — COMPREHENSIVE METABOLIC PANEL
ALT: 27 U/L (ref 0–44)
AST: 29 U/L (ref 15–41)
Albumin: 4.3 g/dL (ref 3.5–5.0)
Alkaline Phosphatase: 74 U/L (ref 38–126)
Anion gap: 7 (ref 5–15)
BUN: 18 mg/dL (ref 8–23)
CO2: 29 mmol/L (ref 22–32)
Calcium: 9.1 mg/dL (ref 8.9–10.3)
Chloride: 100 mmol/L (ref 98–111)
Creatinine, Ser: 1.16 mg/dL — ABNORMAL HIGH (ref 0.44–1.00)
GFR, Estimated: 48 mL/min — ABNORMAL LOW (ref >60)
Glucose, Bld: 92 mg/dL (ref 70–99)
Potassium: 4.1 mmol/L (ref 3.5–5.1)
Sodium: 136 mmol/L (ref 135–145)
Total Bilirubin: 0.6 mg/dL (ref 0.3–1.2)
Total Protein: 7.8 g/dL (ref 6.5–8.1)

## 2021-08-26 LAB — CBC WITH DIFFERENTIAL/PLATELET
Abs Immature Granulocytes: 0.04 10*3/uL (ref 0.00–0.07)
Basophils Absolute: 0 10*3/uL (ref 0.0–0.1)
Basophils Relative: 0 %
Eosinophils Absolute: 0.1 10*3/uL (ref 0.0–0.5)
Eosinophils Relative: 1 %
HCT: 37.6 % (ref 36.0–46.0)
Hemoglobin: 12.7 g/dL (ref 12.0–15.0)
Immature Granulocytes: 0 %
Lymphocytes Relative: 20 %
Lymphs Abs: 1.8 10*3/uL (ref 0.7–4.0)
MCH: 32.1 pg (ref 26.0–34.0)
MCHC: 33.8 g/dL (ref 30.0–36.0)
MCV: 94.9 fL (ref 80.0–100.0)
Monocytes Absolute: 0.6 10*3/uL (ref 0.1–1.0)
Monocytes Relative: 7 %
Neutro Abs: 6.4 10*3/uL (ref 1.7–7.7)
Neutrophils Relative %: 72 %
Platelets: 235 10*3/uL (ref 150–400)
RBC: 3.96 MIL/uL (ref 3.87–5.11)
RDW: 12.8 % (ref 11.5–15.5)
WBC: 9.1 10*3/uL (ref 4.0–10.5)
nRBC: 0 % (ref 0.0–0.2)

## 2021-08-26 LAB — LACTATE DEHYDROGENASE: LDH: 179 U/L (ref 98–192)

## 2021-08-26 NOTE — Assessment & Plan Note (Addendum)
#   RETROPERITONEAL LN [incidental/asymptomatic]- follicular lymphoma grade 2; likely stage II; status post rituximab. FEB 2019 PET scan NED- STABLE; Clinically no evidence recurrence. will get scans on clinical basis; CT   ? ?# CKD- III-GFR 53-STABLE.  [s/p Dr.Kolluru nephrology]. ? ?# A.fib- on eliquis- STABLE.  ? ?# Tremors-benign versus others.  Defer to PCP.  ? ?#TMJ versus others-recommend further evaluation with dentistry.  ? ?# DISPOSITION: ?# follow up in 6 months-MD/ labs-cbc/cmp,ldh-Dr.B ? ?Cc: Dr.Tullo ?

## 2021-08-26 NOTE — Progress Notes (Signed)
C/o gritting teeth, getting pain in jaws, what can she do to stop this? The only thing she's done different is taking a sinus medication. ? ?C/o that thumbs tremble and would like to know what could cause this? ? ?

## 2021-08-26 NOTE — Progress Notes (Signed)
I connected with Whitney Molina on 08/26/21 at  3:15 PM EDT by video enabled telemedicine visit and verified that I am speaking with the correct person using two identifiers.  ?I discussed the limitations, risks, security and privacy concerns of performing an evaluation and management service by telemedicine and the availability of in-person appointments. I also discussed with the patient that there may be a patient responsible charge related to this service. The patient expressed understanding and agreed to proceed.  ? ? ?Other persons participating in the visit and their role in the encounter: RN/medical reconciliation ?Patient?s location: office ?Provider?s location: home ? ?Oncology History Overview Note  ? ?# NOV 0802- FOLLICULAR LYMPHOMA G-2; [s/p Bx- RETROPERITONEAL/LEFT Peri-aortic LN ~ 2.5CM [incidental on CT scan in 2016; CT- Nov 2014- ~1CM]/ PET 2016-Left Peri-aortic LN; Suv ~15; Ext Iliac LN 6 mm; Feb 2017- Unchanged LN; START Rituxan q week x4 [finished march 15th]; PET JAN 30th 2018- NED.  ? ?# Hx of Shingles [Sep 2016]-  ? ?# Kidney cysts [previous Dr.Wolfe]; Afib on eliquis [Dr.Brian; [facility]] ? ?DIAGNOSIS: Follicle lymphoma-G-2 ? ?STAGE: Stage II       ;GOALS: Control ? ?CURRENT/MOST RECENT THERAPY: Surveillance ? ?  ?Follicular lymphoma grade ii, intra-abdominal lymph nodes ([place])  ? ? ? ?Chief Complaint: lymphoma  ? ? ?History of present illness:Whitney Molina 81 y.o.  female with history of follicle lymphoma of the abdomen currently under surveillance is here for follow-up. ? ?Patient denies abdominal pain denies any nausea vomiting.  Complains of  gritting teeth, getting pain in jaws.  Help with Tylenol as needed.  Patient had prior dental work done recently. ? ?Also complains of finger tremors.  No falls.  ? ?Observation/objective: Alert & oriented x 3. In No acute distress.  ? ?Assessment and plan: ?Follicular lymphoma grade ii, intra-abdominal lymph nodes ([place]) ?# RETROPERITONEAL LN  [incidental/asymptomatic]- follicular lymphoma grade 2; likely stage II; status post rituximab. FEB 2019 PET scan NED- STABLE; Clinically no evidence recurrence. will get scans on clinical basis; CT   ? ?# CKD- III-GFR 53-STABLE.  [s/p Dr.Kolluru nephrology]. ? ?# A.fib- on eliquis- STABLE.  ? ?# Tremors-benign versus others.  Defer to PCP.  ? ?#TMJ versus others-recommend further evaluation with dentistry.  ? ?# DISPOSITION: ?# follow up in 6 months-MD/ labs-cbc/cmp,ldh-Dr.B ? ?Cc: Dr.Tullo ? ? ? ?Follow-up instructions: ? ?I discussed the assessment and treatment plan with the patient.  The patient was provided an opportunity to ask questions and all were answered.  The patient agreed with the plan and demonstrated understanding of instructions. ? ?The patient was advised to call back or seek an in person evaluation if the symptoms worsen or if the condition fails to improve as anticipated. ? ? ? ?Dr. Charlaine Dalton ?[facility] ?08/26/2021 ?3:12 PM ?

## 2021-08-29 ENCOUNTER — Other Ambulatory Visit: Payer: Self-pay | Admitting: Internal Medicine

## 2021-08-29 DIAGNOSIS — Z1231 Encounter for screening mammogram for malignant neoplasm of breast: Secondary | ICD-10-CM

## 2021-09-13 ENCOUNTER — Telehealth: Payer: Self-pay | Admitting: Internal Medicine

## 2021-09-13 NOTE — Telephone Encounter (Signed)
Copied from [place] [id number]. Topic: Medicare AWV ?>> Sep 13, 2021 11:50 AM Harris-Coley, Hannah Beat wrote: ?Reason for CRM: LVM 09/13/21 to r/s AWV.  Changed AWV appt to 09/20/21'@10am'$ .  Please confirm appt date change- [facility] ?

## 2021-09-16 ENCOUNTER — Ambulatory Visit: Payer: Medicare Other

## 2021-09-20 ENCOUNTER — Ambulatory Visit: Payer: Medicare Other

## 2021-09-30 ENCOUNTER — Ambulatory Visit
Admission: RE | Admit: 2021-09-30 | Discharge: 2021-09-30 | Disposition: A | Discharge status: Home / Self Care | Payer: Medicare Other | Source: Ambulatory Visit | Attending: Internal Medicine | Admitting: Internal Medicine

## 2021-09-30 DIAGNOSIS — Z1231 Encounter for screening mammogram for malignant neoplasm of breast: Secondary | ICD-10-CM | POA: Diagnosis not present

## 2021-10-05 ENCOUNTER — Ambulatory Visit: Payer: Medicare Other | Admitting: Internal Medicine

## 2021-10-05 ENCOUNTER — Encounter: Payer: Self-pay | Admitting: Internal Medicine

## 2021-10-05 VITALS — BP 132/66 | HR 58 | Temp 98.5°F | Ht 63.0 in | Wt 135.6 lb

## 2021-10-05 DIAGNOSIS — N281 Cyst of kidney, acquired: Secondary | ICD-10-CM

## 2021-10-05 DIAGNOSIS — Z780 Asymptomatic menopausal state: Secondary | ICD-10-CM | POA: Diagnosis not present

## 2021-10-05 DIAGNOSIS — Z789 Other specified health status: Secondary | ICD-10-CM | POA: Diagnosis not present

## 2021-10-05 DIAGNOSIS — Z1211 Encounter for screening for malignant neoplasm of colon: Secondary | ICD-10-CM | POA: Diagnosis not present

## 2021-10-05 DIAGNOSIS — I129 Hypertensive chronic kidney disease with stage 1 through stage 4 chronic kidney disease, or unspecified chronic kidney disease: Secondary | ICD-10-CM

## 2021-10-05 DIAGNOSIS — R7303 Prediabetes: Secondary | ICD-10-CM

## 2021-10-05 DIAGNOSIS — E785 Hyperlipidemia, unspecified: Secondary | ICD-10-CM | POA: Diagnosis not present

## 2021-10-05 DIAGNOSIS — I700 Atherosclerosis of aorta: Secondary | ICD-10-CM | POA: Diagnosis not present

## 2021-10-05 DIAGNOSIS — N200 Calculus of kidney: Secondary | ICD-10-CM

## 2021-10-05 DIAGNOSIS — M810 Age-related osteoporosis without current pathological fracture: Secondary | ICD-10-CM | POA: Diagnosis not present

## 2021-10-05 DIAGNOSIS — D126 Benign neoplasm of colon, unspecified: Secondary | ICD-10-CM

## 2021-10-05 DIAGNOSIS — I480 Paroxysmal atrial fibrillation: Secondary | ICD-10-CM

## 2021-10-05 DIAGNOSIS — C8213 Follicular lymphoma grade II, intra-abdominal lymph nodes: Secondary | ICD-10-CM

## 2021-10-05 DIAGNOSIS — D6869 Other thrombophilia: Secondary | ICD-10-CM

## 2021-10-05 DIAGNOSIS — N1832 Chronic kidney disease, stage 3b: Secondary | ICD-10-CM

## 2021-10-05 LAB — POCT GLYCOSYLATED HEMOGLOBIN (HGB A1C): Hemoglobin A1C: 5.5 % (ref 4.0–5.6)

## 2021-10-05 MED ORDER — AMLODIPINE BESYLATE 5 MG PO TABS
5.0000 mg | ORAL_TABLET | Freq: Every day | ORAL | 1 refills | Status: DC
Start: 2021-10-05 — End: 2022-05-12

## 2021-10-05 MED ORDER — ROSUVASTATIN CALCIUM 20 MG PO TABS: 20.0000 mg | ORAL_TABLET | Freq: Every day | ORAL | 1 refills | Status: DC

## 2021-10-05 MED ORDER — AMIODARONE HCL 200 MG PO TABS: 200.0000 mg | ORAL_TABLET | Freq: Two times a day (BID) | ORAL | 5 refills | Status: DC

## 2021-10-05 NOTE — Progress Notes (Signed)
Subjective:  Patient ID: Whitney Molina, female    DOB: 1941/01/27  Age: 81 y.o. MRN: [id number]  CC: The primary encounter diagnosis was Hyperlipidemia LDL goal <130. Diagnoses of Prediabetes, Renal hypertension, Postmenopausal estrogen deficiency, Colon cancer screening, Abdominal aortic atherosclerosis (Whitney Molina), Paroxysmal atrial fibrillation (HCC), Tubular adenoma of colon, Statin intolerance, Age-related osteoporosis without current pathological fracture, Left nephrolithiasis, Renal cyst, Stage 3b chronic kidney disease ([place]), Follicular lymphoma grade ii, intra-abdominal lymph nodes ([place]), and Acquired thrombophilia ([place]) were also pertinent to this visit.   HPI Whitney Molina presents for  Chief Complaint  Patient presents with   Follow-up    6 month follow up on hypertension and hyperlipidemia   1) HTN:  Hypertension: patient checks blood pressure  daily  at home.  Readings have been  < 130/80 . Patient is following a reduce salt diet most days and is taking medications as prescribed (amlodipine and losartan)   2) Atrial Fibrillation:  taking  bid and eliquis  bid.   Pulse  Feels finally steady and energy level is improved.    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  Resting more as well  3) nephrolithiasis:  underwent lithotripsy of left ureteral stone in Feb.  Calcium oxalate by analysis.  Reviewed diet w/r/t  water and citric acid intake.  No recurren e thus far.   3)  constipation:  taking metamucil daily and BMS are usually occurring daily . Colonoscopy is due for history of T/A .  Referral made.  Drinking at least 60 ounces waterdaily   4) Follicular lymphedema :  incidental finding in 2019.  S/p treatment. With Rituximab   following up regularly with Brahmanday   5)  CKD: sees Kollaru annually/semiannually .  No change to GFR   6) Osteoporosis :  her last DEXA resulting ina sprained knee due to positioning insisted by  rad tech;  she is not interested in having more scan s   Outpatient Medications Prior to Visit  Medication Sig Dispense Refill   albuterol (VENTOLIN HFA) 108 (90 Base) MCG/ACT inhaler INHALE 1 PUFFS INTO THE LUNGS EVERY 6 HOURS AS NEEDED FOR WHEEZING OR SHORTNESS OF BREATH 18 g 2   apixaban (ELIQUIS) 5 MG TABS tablet Take 1 tablet (5 mg total) by mouth 2 (two) times daily. 60 tablet 5   Cholecalciferol (VITAMIN D3) 1000 units CAPS Take 1,000 Units by mouth daily.     Co-Enzyme Q10 200 MG CAPS Take 1 tablet by mouth. Taking 1 every other day     escitalopram (LEXAPRO) 10 MG tablet TAKE 1 TABLET BY MOUTH ONCE DAILY 90 tablet 1   hyoscyamine (LEVBID) 0.375 MG 12 hr tablet TAKE 1 TABLET BY MOUTH ONCE DAILY 90 tablet 0   losartan (COZAAR) 100 MG tablet TAKE 1 TABLET BY MOUTH ONCE DAILY 90 tablet 1   psyllium (METAMUCIL) 58.6 % packet Take 1 packet by mouth daily.     amiodarone (PACERONE) 200 MG tablet Take 1 tablet (200 mg total) by mouth daily. Take 2 tablets by mouth twice a day for 1 week, then take 1 tablet by mouth twice a day. 70 tablet 3   amLODipine (NORVASC) 5 MG tablet Take 5 mg by mouth daily.     rosuvastatin (CRESTOR) 20 MG tablet TAKE 1 TABLET BY MOUTH AT BEDTIME 30 tablet 2   No facility-administered medications prior to visit.    Review of Systems;  Patient denies headache, fevers, malaise,  unintentional weight loss, skin rash, eye pain, sinus congestion and sinus pain, sore throat, dysphagia,  hemoptysis , cough, dyspnea, wheezing, chest pain, palpitations, orthopnea, edema, abdominal pain, nausea, melena, diarrhea, constipation, flank pain, dysuria, hematuria, urinary  Frequency, nocturia, numbness, tingling, seizures,  Focal weakness, Loss of consciousness,  Tremor, insomnia, depression, anxiety, and suicidal ideation.      Objective:  BP 132/66 (BP Location: Left Arm, Patient Position: Sitting, Cuff Size: Normal)   Pulse (!) 58   Temp 98.5 F (36.9 C) (Oral)   Ht '5\' 3"'$   (1.6 m)   Wt 135 lb 9.6 oz (61.5 kg)   SpO2 97%   BMI 24.02 kg/m   BP Readings from Last 3 Encounters:  10/05/21 132/66  08/26/21 (!) 162/80  07/27/21 (!) 163/72    Wt Readings from Last 3 Encounters:  10/05/21 135 lb 9.6 oz (61.5 kg)  08/26/21 135 lb 9.6 oz (61.5 kg)  07/27/21 132 lb (59.9 kg)    General appearance: alert, cooperative and appears stated age Ears: normal TM's and external ear canals both ears Throat: lips, mucosa, and tongue normal; teeth and gums normal Neck: no adenopathy, no carotid bruit, supple, symmetrical, trachea midline and thyroid not enlarged, symmetric, no tenderness/mass/nodules Back: symmetric, no curvature. ROM normal. No CVA tenderness. Lungs: clear to auscultation bilaterally Heart: regular rate and rhythm, S1, S2 normal, no murmur, click, rub or gallop Abdomen: soft, non-tender; bowel sounds normal; no masses,  no organomegaly Pulses: 2+ and symmetric Skin: Skin color, texture, turgor normal. No rashes or lesions Lymph nodes: Cervical, supraclavicular, and axillary nodes normal.  Lab Results  Component Value Date   HGBA1C 5.5 10/05/2021   HGBA1C 6.3 04/20/2020   HGBA1C 5.9 04/01/2018    Lab Results  Component Value Date   CREATININE 1.16 (H) 08/26/2021   CREATININE 1.04 04/06/2021   CREATININE 1.06 (H) 02/04/2021    Lab Results  Component Value Date   WBC 9.1 08/26/2021   HGB 12.7 08/26/2021   HCT 37.6 08/26/2021   PLT 235 08/26/2021   GLUCOSE 92 08/26/2021   CHOL 152 01/03/2021   TRIG 134.0 01/03/2021   HDL 36.00 (L) 01/03/2021   LDLDIRECT 145.0 04/01/2018   LDLCALC 89 01/03/2021   ALT 27 08/26/2021   AST 29 08/26/2021   NA 136 08/26/2021   K 4.1 08/26/2021   CL 100 08/26/2021   CREATININE 1.16 (H) 08/26/2021   BUN 18 08/26/2021   CO2 29 08/26/2021   TSH 1.210 05/10/2021   INR 1.02 03/24/2015   HGBA1C 5.5 10/05/2021   MICROALBUR 9.7 (H) 04/20/2020    MM 3D SCREEN BREAST BILATERAL  Result Date:  10/03/2021 CLINICAL DATA:  Screening. EXAM: DIGITAL SCREENING BILATERAL MAMMOGRAM WITH TOMOSYNTHESIS AND CAD TECHNIQUE: Bilateral screening digital craniocaudal and mediolateral oblique mammograms were obtained. Bilateral screening digital breast tomosynthesis was performed. The images were evaluated with computer-aided detection. COMPARISON:  Previous exam(s). ACR Breast Density Category c: The breast tissue is heterogeneously dense, which may obscure small masses. FINDINGS: There are no findings suspicious for malignancy. IMPRESSION: No mammographic evidence of malignancy. A result letter of this screening mammogram will be mailed directly to the patient. RECOMMENDATION: Screening mammogram in one year. (Code:SM-B-01Y) BI-RADS CATEGORY  1: Negative. Electronically Signed   By: Valentino Saxon M.D.   On: 10/03/2021 09:28    Assessment & Plan:   Problem List Items Addressed This Visit     Hyperlipidemia LDL goal <130 - Primary   Relevant Medications   rosuvastatin (CRESTOR)  20 MG tablet   amiodarone (PACERONE) 200 MG tablet   amLODipine (NORVASC) 5 MG tablet   Abdominal aortic atherosclerosis (HCC)    Reviewed findings of prior CT scan today..  Patient is tolerating Crestor without side effects.        Relevant Medications   rosuvastatin (CRESTOR) 20 MG tablet   amiodarone (PACERONE) 200 MG tablet   amLODipine (NORVASC) 5 MG tablet   Acquired thrombophilia ([place])    Secondary to PAF.  Reviewed risk of embolic CVA with patient and advised her to continue Eliquis        CKD (chronic kidney disease) stage 3, GFR 30-59 ml/min (HCC)    Stable by Nov 2022 nephrology evaluation        Colon cancer screening   Relevant Orders   Ambulatory referral to Gastroenterology   Follicular lymphoma grade ii, intra-abdominal lymph nodes ([place])     RETROPERITONEAL LN [incidental/asymptomatic]- follicular lymphoma grade 2; likely stage II; status post rituximab. FEB 2019 PET scan NED;STABLE.   Per  evaluatoin by Dr Rogue Bussing in Sept 2022 : # Clinically no evidence recurrence. will get scans on clinical basis.         Left nephrolithiasis   Osteoporosis    Untreated per patient prefernce.  She declines follow up DEXA due to the positioning of her knee by the tech causing moderate pain requiring orthopedic referral       Paroxysmal atrial fibrillation ([place])    Echocardiogram 06/8313 normal systolic function, EF 60 to 65%, normal LA size. DCCV 11/2020 unsuccessful, follow-up in clinic with A. fib, amiodarone started.  Currently in sinus rhythm continue  amiodarone and Lopressor 25 mg twice daily, Eliquis 5 mg twice daily for CHADS score of 4       Relevant Medications   rosuvastatin (CRESTOR) 20 MG tablet   amiodarone (PACERONE) 200 MG tablet   amLODipine (NORVASC) 5 MG tablet   Prediabetes    Last a1c in 2021 was 6.3  Rechecking today and is now normalized  . Last fasting glucose was <100 in August 2022  Lab Results  Component Value Date   HGBA1C 5.5 10/05/2021         Relevant Orders   POCT HgB A1C (Completed)   Renal cyst   Renal hypertension   Relevant Medications   rosuvastatin (CRESTOR) 20 MG tablet   amiodarone (PACERONE) 200 MG tablet   amLODipine (NORVASC) 5 MG tablet   Statin intolerance    She is tolerating rosuvastatin to reduce risk of CVA given presence of AA        Tubular adenoma of colon   Relevant Orders   Ambulatory referral to Gastroenterology   Other Visit Diagnoses     Postmenopausal estrogen deficiency           I spent a total of 34  minutes with this patient in a face to face visit on the date of this encounter reviewing the last office visit with me in November,  her ost recent follow up with Dr Rolly Salter,  dr. Erlene Quan  DR Rogue Bussing and Dr Ubaldo Glassing , patient'ss diet and eating habits, home blood pressure readings ,  most recent imaging study ,   and post visit ordering of testing and therapeutics.    Follow-up: Return in about 6 months  (around 04/07/2022).   [name], MD

## 2021-10-05 NOTE — Assessment & Plan Note (Addendum)
Echocardiogram 08/7996 normal systolic function, EF 60 to 65%, normal LA size. DCCV 11/2020 unsuccessful, follow-up in clinic with A. fib, amiodarone started. Currently in sinus rhythm continue  amiodarone and Lopressor 25 mg twice daily, Eliquis 5 mg twice daily for CHADS score of 4

## 2021-10-05 NOTE — Patient Instructions (Signed)
You are doing well!  I have refilled your cholesterol and blood pressure medications  See you in 6 months

## 2021-10-05 NOTE — Assessment & Plan Note (Addendum)
Last a1c in 2021 was 6.3  Rechecking today and is now normalized  . Last fasting glucose was <100 in August 2022  Lab Results  Component Value Date   HGBA1C 5.5 10/05/2021

## 2021-10-05 NOTE — Assessment & Plan Note (Signed)
Reviewed findings of prior CT scan today..  Patient is tolerating Crestor without side effects.

## 2021-10-05 NOTE — Assessment & Plan Note (Addendum)
She is tolerating rosuvastatin to reduce risk of CVA given presence of AA

## 2021-10-05 NOTE — Assessment & Plan Note (Signed)
Stable by Nov 2022 nephrology evaluation

## 2021-10-05 NOTE — Assessment & Plan Note (Signed)
Secondary to PAF.  Reviewed risk of embolic CVA with patient and advised her to continue Eliquis

## 2021-10-05 NOTE — Assessment & Plan Note (Addendum)
Untreated per patient prefernce.  She declines follow up DEXA due to the positioning of her knee by the tech causing moderate pain requiring orthopedic referral

## 2021-10-05 NOTE — Assessment & Plan Note (Signed)
RETROPERITONEAL LN [incidental/asymptomatic]- follicular lymphoma grade 2; likely stage II; status post rituximab. FEB 2019 PET scan NED;STABLE.   Per evaluatoin by Dr Rogue Bussing in Sept 2022 : # Clinically no evidence recurrence. will get scans on clinical basis.

## 2021-10-20 DIAGNOSIS — J301 Allergic rhinitis due to pollen: Secondary | ICD-10-CM | POA: Diagnosis not present

## 2021-10-20 DIAGNOSIS — R040 Epistaxis: Secondary | ICD-10-CM | POA: Diagnosis not present

## 2021-10-20 DIAGNOSIS — K219 Gastro-esophageal reflux disease without esophagitis: Secondary | ICD-10-CM | POA: Diagnosis not present

## 2021-10-26 ENCOUNTER — Other Ambulatory Visit: Payer: Self-pay | Admitting: Family

## 2021-10-26 ENCOUNTER — Other Ambulatory Visit: Payer: Self-pay | Admitting: Internal Medicine

## 2021-10-27 ENCOUNTER — Telehealth: Payer: Self-pay | Admitting: Internal Medicine

## 2021-10-27 NOTE — Telephone Encounter (Signed)
Copied from [place] [id number]. Topic: Medicare AWV >> Oct 27, 2021  9:58 AM Devoria Glassing wrote: Reason for CRM: Left message for patient to schedule Annual Wellness Visit.  Please schedule with Nurse Health Advisor Denisa O'Brien-Blaney, LPN at [facility].  Please call [phone] ask for [facility]

## 2021-10-27 NOTE — Telephone Encounter (Signed)
Pt returning call...  Pt stated that she would like to skip annual wellness visit.Marland KitchenMarland Kitchen

## 2021-11-01 ENCOUNTER — Other Ambulatory Visit: Payer: Self-pay

## 2021-11-01 MED ORDER — HYOSCYAMINE SULFATE ER 0.375 MG PO TB12: 0.3750 mg | ORAL_TABLET | Freq: Every day | ORAL | 0 refills | Status: DC

## 2021-11-08 ENCOUNTER — Ambulatory Visit: Payer: Medicare Other | Admitting: Cardiology

## 2021-11-08 ENCOUNTER — Encounter: Payer: Self-pay | Admitting: Cardiology

## 2021-11-08 VITALS — BP 130/78 | HR 54 | Ht 63.0 in | Wt 132.5 lb

## 2021-11-08 DIAGNOSIS — E7800 Pure hypercholesterolemia, unspecified: Secondary | ICD-10-CM

## 2021-11-08 DIAGNOSIS — I4819 Other persistent atrial fibrillation: Secondary | ICD-10-CM | POA: Diagnosis not present

## 2021-11-08 DIAGNOSIS — I10 Essential (primary) hypertension: Secondary | ICD-10-CM

## 2021-11-08 MED ORDER — AMIODARONE HCL 200 MG PO TABS: 200.0000 mg | ORAL_TABLET | Freq: Every day | ORAL | 3 refills | Status: DC

## 2021-11-30 DIAGNOSIS — H6122 Impacted cerumen, left ear: Secondary | ICD-10-CM | POA: Diagnosis not present

## 2021-11-30 DIAGNOSIS — J301 Allergic rhinitis due to pollen: Secondary | ICD-10-CM | POA: Diagnosis not present

## 2021-11-30 DIAGNOSIS — K219 Gastro-esophageal reflux disease without esophagitis: Secondary | ICD-10-CM | POA: Diagnosis not present

## 2022-01-09 DIAGNOSIS — I10 Essential (primary) hypertension: Secondary | ICD-10-CM | POA: Diagnosis not present

## 2022-01-09 DIAGNOSIS — D631 Anemia in chronic kidney disease: Secondary | ICD-10-CM | POA: Diagnosis not present

## 2022-01-09 DIAGNOSIS — E876 Hypokalemia: Secondary | ICD-10-CM | POA: Diagnosis not present

## 2022-01-09 DIAGNOSIS — N1832 Chronic kidney disease, stage 3b: Secondary | ICD-10-CM | POA: Diagnosis not present

## 2022-01-11 ENCOUNTER — Other Ambulatory Visit: Payer: Self-pay | Admitting: Internal Medicine

## 2022-01-26 ENCOUNTER — Other Ambulatory Visit: Payer: Self-pay

## 2022-01-26 DIAGNOSIS — N200 Calculus of kidney: Secondary | ICD-10-CM

## 2022-01-26 NOTE — Progress Notes (Unsigned)
01/27/2022 3:32 PM   Whitney Molina 80-Apr-1942 [id number]  Referring provider: [name], MD [address],  [place] [address]  Urological history: 1.  Left renal cyst -CTU 04/2021 - very large left parapelvic cyst, measuring at least 13.0 x 9.4 cm and a 3 cm left upper pole cyst  2. High risk hematuria -non-smoker -CTU 04/2021 -renal cysts -cysto 04/2021 - NED  -no reports of gross heme  3.  Nephrolithiasis -Stone composition calcium oxalate 95% -ESWL 06/16/2021  Chief Complaint  Patient presents with   Nephrolithiasis     HPI: Whitney Molina is a 81 y.o. who presents today for a 6 month follow up.   She states she feels well.  Patient denies any modifying or aggravating factors.  Patient denies any gross hematuria, dysuria or suprapubic/flank pain.  Patient denies any fevers, chills, nausea or vomiting.    KUB (01/28/2022) constipation, no stones seen but stones could be obscured by stool and gas   PMH: Past Medical History:  Diagnosis Date   Basal cell carcinoma of nose    Bronchitis    GERD (gastroesophageal reflux disease)    Heart murmur    Hemorrhoids    History of basal cell carcinoma    right upper lip, right ant shoulder   History of colon polyps    History of dysplastic nevus 12/05/2001   left paraspinal upper back, upper back spinal   History of kidney stones    History of mammogram 2016   History of squamous cell carcinoma 02/07/2005   left ant chest   Hyperlipidemia    Hypertension    Non Hodgkin's lymphoma ([place])    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   removed at [facility]    Surgical History: Past Surgical History:  Procedure Laterality Date   ABDOMINAL HYSTERECTOMY  1984   BLADDER SUSPENSION  2010   CARDIOVERSION N/A 11/30/2020   Procedure: CARDIOVERSION;  Surgeon: [name], MD;  Location: [facility] ORS;  Service: Cardiovascular;  Laterality: N/A;   CHOLECYSTECTOMY  06-01-03    COLONOSCOPY  2003   EXTRACORPOREAL SHOCK WAVE LITHOTRIPSY Left 06/16/2021   Procedure: EXTRACORPOREAL SHOCK WAVE LITHOTRIPSY (ESWL);  Surgeon: Hollice Espy, MD;  Location: [facility] ORS;  Service: Urology;  Laterality: Left;   LITHOTRIPSY  2005   local exicsion skin cancer  2004   squamous cell of chest wall. left chest   OOPHORECTOMY     TONSILLECTOMY  1971   TUBAL LIGATION  1975   VAGINAL PROLAPSE REPAIR  July 2001   Pelvic Prolapse    Home Medications:  Current Outpatient Medications on File Prior to Visit  Medication Sig Dispense Refill   amiodarone (PACERONE) 200 MG tablet Take 1 tablet (200 mg total) by mouth daily. 90 tablet 3   amLODipine (NORVASC) 5 MG tablet Take 1 tablet (5 mg total) by mouth daily. 90 tablet 1   apixaban (ELIQUIS) 5 MG TABS tablet Take 1 tablet (5 mg total) by mouth 2 (two) times daily. 60 tablet 5   Cholecalciferol (VITAMIN D3) 1000 units CAPS Take 1,000 Units by mouth daily.     Co-Enzyme Q10 200 MG CAPS Take 1 tablet by mouth. Taking 1 every other day     hyoscyamine (LEVBID) 0.375 MG 12 hr tablet Take 1 tablet (0.375 mg total) by mouth daily. 90 tablet 0   losartan (COZAAR) 100 MG tablet TAKE 1 TABLET BY MOUTH ONCE DAILY 90 tablet 1   psyllium (  METAMUCIL) 58.6 % packet Take 1 packet by mouth daily.     rosuvastatin (CRESTOR) 20 MG tablet TAKE 1 TABLET BY MOUTH AT BEDTIME 90 tablet 1   albuterol (VENTOLIN HFA) 108 (90 Base) MCG/ACT inhaler INHALE 1 PUFFS INTO THE LUNGS EVERY 6 HOURS AS NEEDED FOR WHEEZING OR SHORTNESS OF BREATH (Patient not taking: Reported on 01/27/2022) 18 g 2   escitalopram (LEXAPRO) 10 MG tablet TAKE 1 TABLET BY MOUTH ONCE DAILY (Patient not taking: Reported on 01/27/2022) 90 tablet 1   No current facility-administered medications on file prior to visit.    Allergies: No Known Allergies  Family History: Family History  Problem Relation Age of Onset   Stroke Mother    Breast cancer Mother 67   Stroke Father    Dementia Father     Cancer Brother        bladder   Stroke Brother    Heart attack Brother    Breast cancer Paternal Aunt 66   Bipolar disorder Son     Social History:  reports that she has never smoked. She has never used smokeless tobacco. She reports that she does not drink alcohol and does not use drugs.  ROS: Pertinent ROS in HPI  Physical Exam: BP 135/69   Pulse (!) 59   Ht '5\' 3"'$  (1.6 m)   Wt 127 lb (57.6 kg)   BMI 22.50 kg/m   Constitutional:  Well nourished. Alert and oriented, No acute distress. HEENT: [place] AT, moist mucus membranes.  Trachea midline Cardiovascular: No clubbing, cyanosis, or edema. Respiratory: Normal respiratory effort, no increased work of breathing. Neurologic: Grossly intact, no focal deficits, moving all 4 extremities. Psychiatric: Normal mood and affect.    Laboratory Data: N/A   Pertinent Imaging: See HPI and Epic.  I have independently reviewed the films.  Radiologist interpretation still pending  Assessment & Plan:    1. Nephrolithiasis -no stone seen on today's Xray -explained that the left renal shadow is obscured by stool and gas and stones may be hiding -offered to return next week after taking a laxative for follow up Xray, but she deferred -she would like to return in 6 months     Return in about 6 months (around 07/28/2022) for KUB.  These notes generated with voice recognition software. I apologize for typographical errors.  Zara Council, PA-C  [facility] [address]  [place], [place] [address] [phone]

## 2022-01-27 ENCOUNTER — Ambulatory Visit
Admission: RE | Admit: 2022-01-27 | Discharge: 2022-01-27 | Disposition: A | Discharge status: Home / Self Care | Payer: Medicare Other | Attending: Urology | Admitting: Urology

## 2022-01-27 ENCOUNTER — Ambulatory Visit
Admission: RE | Admit: 2022-01-27 | Discharge: 2022-01-27 | Disposition: A | Discharge status: Home / Self Care | Payer: Medicare Other | Source: Ambulatory Visit | Attending: Urology | Admitting: Urology

## 2022-01-27 ENCOUNTER — Ambulatory Visit: Payer: Medicare Other | Admitting: Urology

## 2022-01-27 VITALS — BP 135/69 | HR 59 | Ht 63.0 in | Wt 127.0 lb

## 2022-01-27 DIAGNOSIS — K5900 Constipation, unspecified: Secondary | ICD-10-CM | POA: Diagnosis not present

## 2022-01-27 DIAGNOSIS — Z87442 Personal history of urinary calculi: Secondary | ICD-10-CM | POA: Diagnosis not present

## 2022-01-27 DIAGNOSIS — R109 Unspecified abdominal pain: Secondary | ICD-10-CM | POA: Diagnosis not present

## 2022-01-27 DIAGNOSIS — N200 Calculus of kidney: Secondary | ICD-10-CM | POA: Diagnosis not present

## 2022-01-27 DIAGNOSIS — R93422 Abnormal radiologic findings on diagnostic imaging of left kidney: Secondary | ICD-10-CM | POA: Diagnosis not present

## 2022-01-28 ENCOUNTER — Encounter: Payer: Self-pay | Admitting: Urology

## 2022-02-06 ENCOUNTER — Other Ambulatory Visit: Payer: Self-pay

## 2022-02-06 ENCOUNTER — Other Ambulatory Visit: Payer: Self-pay | Admitting: Internal Medicine

## 2022-02-06 MED ORDER — APIXABAN 2.5 MG PO TABS: 2.5000 mg | ORAL_TABLET | Freq: Two times a day (BID) | ORAL | 1 refills | Status: DC

## 2022-02-06 NOTE — Telephone Encounter (Signed)
Prescription refill request for Eliquis received. Indication: Afib  Last office visit:11/08/21 ([place])  Scr: 1.16 (04/06/21)  Age: 81 Weight: 57.6kg  Due to age and weight, dose should be decreased to 2.'5mg'$  BID. Dose change approved by Karren Cobble, PharmD. Called pt and made her aware of dose change. Appropriate and refill sent to requested pharmacy.

## 2022-02-27 ENCOUNTER — Inpatient Hospital Stay: Payer: Medicare Other | Attending: Internal Medicine

## 2022-02-27 ENCOUNTER — Inpatient Hospital Stay (HOSPITAL_BASED_OUTPATIENT_CLINIC_OR_DEPARTMENT_OTHER): Payer: Medicare Other | Admitting: Internal Medicine

## 2022-02-27 ENCOUNTER — Encounter: Payer: Self-pay | Admitting: Internal Medicine

## 2022-02-27 DIAGNOSIS — Z803 Family history of malignant neoplasm of breast: Secondary | ICD-10-CM | POA: Diagnosis not present

## 2022-02-27 DIAGNOSIS — C8213 Follicular lymphoma grade II, intra-abdominal lymph nodes: Secondary | ICD-10-CM | POA: Diagnosis not present

## 2022-02-27 DIAGNOSIS — I129 Hypertensive chronic kidney disease with stage 1 through stage 4 chronic kidney disease, or unspecified chronic kidney disease: Secondary | ICD-10-CM | POA: Diagnosis not present

## 2022-02-27 DIAGNOSIS — I4891 Unspecified atrial fibrillation: Secondary | ICD-10-CM | POA: Diagnosis not present

## 2022-02-27 DIAGNOSIS — R251 Tremor, unspecified: Secondary | ICD-10-CM | POA: Diagnosis not present

## 2022-02-27 DIAGNOSIS — Z8052 Family history of malignant neoplasm of bladder: Secondary | ICD-10-CM | POA: Diagnosis not present

## 2022-02-27 DIAGNOSIS — Z7901 Long term (current) use of anticoagulants: Secondary | ICD-10-CM | POA: Insufficient documentation

## 2022-02-27 DIAGNOSIS — Z90710 Acquired absence of both cervix and uterus: Secondary | ICD-10-CM | POA: Diagnosis not present

## 2022-02-27 DIAGNOSIS — N183 Chronic kidney disease, stage 3 unspecified: Secondary | ICD-10-CM | POA: Diagnosis not present

## 2022-02-27 LAB — CBC WITH DIFFERENTIAL/PLATELET
Abs Immature Granulocytes: 0.01 10*3/uL (ref 0.00–0.07)
Basophils Absolute: 0 10*3/uL (ref 0.0–0.1)
Basophils Relative: 1 %
Eosinophils Absolute: 0.2 10*3/uL (ref 0.0–0.5)
Eosinophils Relative: 3 %
HCT: 37.3 % (ref 36.0–46.0)
Hemoglobin: 12.7 g/dL (ref 12.0–15.0)
Immature Granulocytes: 0 %
Lymphocytes Relative: 22 %
Lymphs Abs: 1.3 10*3/uL (ref 0.7–4.0)
MCH: 32.2 pg (ref 26.0–34.0)
MCHC: 34 g/dL (ref 30.0–36.0)
MCV: 94.7 fL (ref 80.0–100.0)
Monocytes Absolute: 0.6 10*3/uL (ref 0.1–1.0)
Monocytes Relative: 10 %
Neutro Abs: 3.7 10*3/uL (ref 1.7–7.7)
Neutrophils Relative %: 64 %
Platelets: 207 10*3/uL (ref 150–400)
RBC: 3.94 MIL/uL (ref 3.87–5.11)
RDW: 12.9 % (ref 11.5–15.5)
WBC: 5.7 10*3/uL (ref 4.0–10.5)
nRBC: 0 % (ref 0.0–0.2)

## 2022-02-27 LAB — COMPREHENSIVE METABOLIC PANEL
ALT: 43 U/L (ref 0–44)
AST: 33 U/L (ref 15–41)
Albumin: 4 g/dL (ref 3.5–5.0)
Alkaline Phosphatase: 77 U/L (ref 38–126)
Anion gap: 5 (ref 5–15)
BUN: 13 mg/dL (ref 8–23)
CO2: 28 mmol/L (ref 22–32)
Calcium: 8.7 mg/dL — ABNORMAL LOW (ref 8.9–10.3)
Chloride: 105 mmol/L (ref 98–111)
Creatinine, Ser: 1.1 mg/dL — ABNORMAL HIGH (ref 0.44–1.00)
GFR, Estimated: 51 mL/min — ABNORMAL LOW (ref >60)
Glucose, Bld: 92 mg/dL (ref 70–99)
Potassium: 3.7 mmol/L (ref 3.5–5.1)
Sodium: 138 mmol/L (ref 135–145)
Total Bilirubin: 0.6 mg/dL (ref 0.3–1.2)
Total Protein: 7.3 g/dL (ref 6.5–8.1)

## 2022-02-27 LAB — LACTATE DEHYDROGENASE: LDH: 171 U/L (ref 98–192)

## 2022-02-27 NOTE — Progress Notes (Signed)
[facility] OFFICE PROGRESS NOTE  Patient Care Team: [name], MD as PCP - General (Internal Medicine) [name], MD as PCP - Cardiology (Cardiology) Vickie Epley, MD as PCP - Electrophysiology (Cardiology) Bary Castilla Forest Gleason, MD (General Surgery)   Cancer Staging  No matching staging information was found for the patient.   Oncology History Overview Note   # NOV 1751- FOLLICULAR LYMPHOMA G-2; [s/p Bx- RETROPERITONEAL/LEFT Peri-aortic LN ~ 2.5CM [incidental on CT scan in 2016; CT- Nov 2014- ~1CM]/ PET 2016-Left Peri-aortic LN; Suv ~15; Ext Iliac LN 6 mm; Feb 2017- Unchanged LN; START Rituxan q week x4 [finished march 15th]; PET JAN 30th 2018- NED.   # Hx of Shingles [Sep 2016]-   # Kidney cysts [previous Dr.Wolfe]; Afib on eliquis [Dr.Brian; [facility]]  DIAGNOSIS: Follicle lymphoma-G-2  STAGE: Stage II       ;GOALS: Control  CURRENT/MOST RECENT THERAPY: Surveillance    Follicular lymphoma grade ii, intra-abdominal lymph nodes ([place])    INTERVAL HISTORY:Accompanied by husband.  Walk independently.   Whitney Molina 81 y.o.  female pleasant patient above history of stage II follicular lymphoma-grade 2 is here for follow-up.  Patient here today for follow up regarding lymphoma. Patient reports she is having difficulty with memory, has started taking prevagen.  Patient's tremors improved after stopping livbid.   Patient is also on Eliquis.  For A. fib.  Review of Systems  Constitutional:  Positive for malaise/fatigue and weight loss. Negative for chills, diaphoresis and fever.  HENT:  Negative for nosebleeds and sore throat.   Eyes:  Negative for double vision.  Respiratory:  Negative for cough, hemoptysis, sputum production, shortness of breath and wheezing.   Cardiovascular:  Negative for chest pain, palpitations, orthopnea and leg swelling.  Gastrointestinal:  Negative for abdominal pain, blood in stool, constipation, diarrhea,  heartburn, melena, nausea and vomiting.  Genitourinary:  Negative for dysuria, frequency and urgency.  Musculoskeletal:  Negative for back pain and joint pain.  Skin: Negative.  Negative for itching and rash.  Neurological:  Negative for dizziness, tingling, focal weakness, weakness and headaches.  Endo/Heme/Allergies:  Does not bruise/bleed easily.  Psychiatric/Behavioral:  Negative for depression. The patient is not nervous/anxious and does not have insomnia.       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   removed at [place] :   Past Surgical History:  Procedure Laterality Date   ABDOMINAL HYSTERECTOMY  1984   BLADDER SUSPENSION  2010   CARDIOVERSION N/A 11/30/2020   Procedure: CARDIOVERSION;  Surgeon: [name], MD;  Location: [facility] ORS;  Service: Cardiovascular;  Laterality: N/A;   CHOLECYSTECTOMY  06-01-03   COLONOSCOPY  2003   EXTRACORPOREAL SHOCK WAVE LITHOTRIPSY Left 06/16/2021   Procedure: EXTRACORPOREAL  SHOCK WAVE LITHOTRIPSY (ESWL);  Surgeon: Hollice Espy, MD;  Location: [facility] ORS;  Service: Urology;  Laterality: Left;   LITHOTRIPSY  2005   local exicsion skin cancer  2004   squamous cell of chest wall. left chest   OOPHORECTOMY     TONSILLECTOMY  1971   TUBAL LIGATION  1975   VAGINAL PROLAPSE REPAIR  July 2001   Pelvic Prolapse    FAMILY HISTORY :   Family  History  Problem Relation Age of Onset   Stroke Mother    Breast cancer Mother 78   Stroke Father    Dementia Father    Cancer Brother        bladder   Stroke Brother    Heart attack Brother    Breast cancer Paternal Aunt 13   Bipolar disorder Son     SOCIAL HISTORY:   Social History   Tobacco Use   Smoking status: Never   Smokeless tobacco: Never  Vaping Use   Vaping Use: Never used  Substance Use Topics   Alcohol use: No   Drug use: No    ALLERGIES:  has No Known Allergies.  MEDICATIONS:  Current Outpatient Medications  Medication Sig Dispense Refill   albuterol (VENTOLIN HFA) 108 (90 Base) MCG/ACT inhaler INHALE 1 PUFFS INTO THE LUNGS EVERY 6 HOURS AS NEEDED FOR WHEEZING OR SHORTNESS OF BREATH 18 g 2   amiodarone (PACERONE) 200 MG tablet Take 1 tablet (200 mg total) by mouth daily. 90 tablet 3   amLODipine (NORVASC) 5 MG tablet Take 1 tablet (5 mg total) by mouth daily. 90 tablet 1   apixaban (ELIQUIS) 2.5 MG TABS tablet Take 1 tablet (2.5 mg total) by mouth 2 (two) times daily. 180 tablet 1   Apoaequorin (PREVAGEN PO) Take by mouth.     Cholecalciferol (VITAMIN D3) 1000 units CAPS Take 1,000 Units by mouth daily.     Co-Enzyme Q10 200 MG CAPS Take 1 tablet by mouth. Taking 1 every other day     hyoscyamine (LEVBID) 0.375 MG 12 hr tablet TAKE 1 TABLET BY MOUTH ONCE DAILY AS DIRECTED 90 tablet 0   losartan (COZAAR) 100 MG tablet TAKE 1 TABLET BY MOUTH ONCE DAILY 90 tablet 1   rosuvastatin (CRESTOR) 20 MG tablet TAKE 1 TABLET BY MOUTH AT BEDTIME 90 tablet 1   No current facility-administered medications for this visit.    PHYSICAL EXAMINATION: ECOG PERFORMANCE STATUS: 0 - Asymptomatic  BP (!) 157/82   Pulse (!) 57   Temp (!) 97.5 F (36.4 C) (Tympanic)   Resp 18   Wt 126 lb 12.8 oz (57.5 kg)   BMI 22.46 kg/m   Filed Weights   02/27/22 1042  Weight: 126 lb 12.8 oz (57.5 kg)    Physical Exam HENT:     Head: Normocephalic and atraumatic.      Mouth/Throat:     Pharynx: No oropharyngeal exudate.  Eyes:     Pupils: Pupils are equal, round, and reactive to light.  Cardiovascular:     Rate and Rhythm: Normal rate. Rhythm irregular.  Pulmonary:     Effort: Pulmonary effort is normal. No respiratory distress.     Breath sounds: Normal breath sounds. No wheezing.  Abdominal:     General: Bowel sounds are normal. There is no distension.     Palpations: Abdomen is soft. There is no mass.     Tenderness: There is no abdominal tenderness. There is no guarding or rebound.  Musculoskeletal:  General: No tenderness. Normal range of motion.     Cervical back: Normal range of motion and neck supple.  Skin:    General: Skin is warm.  Neurological:     Mental Status: She is alert and oriented to person, place, and time.  Psychiatric:        Mood and Affect: Affect normal.     LABORATORY DATA:  I have reviewed the data as listed    Component Value Date/Time   NA 138 02/27/2022 0926   NA 138 09/10/2013 0758   K 3.7 02/27/2022 0926   K 3.3 (L) 09/10/2013 0758   CL 105 02/27/2022 0926   CL 100 09/10/2013 0758   CO2 28 02/27/2022 0926   CO2 32 09/10/2013 0758   GLUCOSE 92 02/27/2022 0926   GLUCOSE 85 09/10/2013 0758   BUN 13 02/27/2022 0926   BUN 13 09/10/2013 0758   CREATININE 1.10 (H) 02/27/2022 0926   CREATININE 0.96 12/22/2013 0857   CALCIUM 8.7 (L) 02/27/2022 0926   CALCIUM 9.2 09/10/2013 0758   PROT 7.3 02/27/2022 0926   PROT 8.3 (H) 09/10/2013 0758   ALBUMIN 4.0 02/27/2022 0926   ALBUMIN 3.5 09/10/2013 0758   AST 33 02/27/2022 0926   AST 23 09/10/2013 0758   ALT 43 02/27/2022 0926   ALT 28 09/10/2013 0758   ALKPHOS 77 02/27/2022 0926   ALKPHOS 78 09/10/2013 0758   BILITOT 0.6 02/27/2022 0926   BILITOT 0.6 09/10/2013 0758   GFRNONAA 51 (L) 02/27/2022 0926   GFRNONAA 59 (L) 12/22/2013 0857   GFRAA >60 01/21/2020 1309   GFRAA 68 12/22/2013 0857    No results found for: "SPEP", "UPEP"  Lab Results   Component Value Date   WBC 5.7 02/27/2022   NEUTROABS 3.7 02/27/2022   HGB 12.7 02/27/2022   HCT 37.3 02/27/2022   MCV 94.7 02/27/2022   PLT 207 02/27/2022      Chemistry      Component Value Date/Time   NA 138 02/27/2022 0926   NA 138 09/10/2013 0758   K 3.7 02/27/2022 0926   K 3.3 (L) 09/10/2013 0758   CL 105 02/27/2022 0926   CL 100 09/10/2013 0758   CO2 28 02/27/2022 0926   CO2 32 09/10/2013 0758   BUN 13 02/27/2022 0926   BUN 13 09/10/2013 0758   CREATININE 1.10 (H) 02/27/2022 0926   CREATININE 0.96 12/22/2013 0857      Component Value Date/Time   CALCIUM 8.7 (L) 02/27/2022 0926   CALCIUM 9.2 09/10/2013 0758   ALKPHOS 77 02/27/2022 0926   ALKPHOS 78 09/10/2013 0758   AST 33 02/27/2022 0926   AST 23 09/10/2013 0758   ALT 43 02/27/2022 0926   ALT 28 09/10/2013 0758   BILITOT 0.6 02/27/2022 0926   BILITOT 0.6 09/10/2013 0758       RADIOGRAPHIC STUDIES: I have personally reviewed the radiological images as listed and agreed with the findings in the report. No results found.   ASSESSMENT & PLAN:  Follicular lymphoma grade ii, intra-abdominal lymph nodes ([place]) # RETROPERITONEAL LN [incidental/asymptomatic]- follicular lymphoma grade 2; likely stage II; status post rituximab. FEB 2019 PET scan NED- Clinically no evidence recurrence. will get scans on clinical basis; STABLE.   # CKD- III-GFR 53-STABLE.  [s/p Dr.Kolluru nephrology]   # A.fib- on eliquis- 2.5 mg BID STABLE.   # Tremors-benign versus others- off levbid- ? Memory issues-defer to PCP.  # Hypocalcemia- ca- 8.7; recommend ca+vit D.   # DISPOSITION: #  follow up in 6 months-MD/ labs-cbc/cmp,ldh-Dr.B  Cc: Dr.Tullo   Orders Placed This Encounter  Procedures   CBC with Differential/Platelet    Standing Status:   Future    Standing Expiration Date:   02/28/2023   Comprehensive metabolic panel    Standing Status:   Future    Standing Expiration Date:   02/28/2023   Lactate dehydrogenase     Standing Status:   Future    Standing Expiration Date:   02/28/2023   All questions were answered. The patient knows to call the clinic with any problems, questions or concerns.      Cammie Sickle, MD 02/27/2022 12:20 PM

## 2022-02-27 NOTE — Assessment & Plan Note (Addendum)
#   RETROPERITONEAL LN [incidental/asymptomatic]- follicular lymphoma grade 2; likely stage II; status post rituximab. FEB 2019 PET scan NED- Clinically no evidence recurrence. will get scans on clinical basis; STABLE.   # CKD- III-GFR 53-STABLE.  [s/p Dr.Kolluru nephrology]   # A.fib- on eliquis- 2.5 mg BID STABLE.   # Tremors-benign versus others- off levbid- ? Memory issues-defer to PCP.  # Hypocalcemia- ca- 8.7; recommend ca+vit D.   # DISPOSITION: # follow up in 6 months-MD/ labs-cbc/cmp,ldh-Dr.B  Cc: Dr.Tullo

## 2022-02-27 NOTE — Progress Notes (Signed)
Patient here today for follow up regarding lymphoma. Patient reports she is having difficulty with memory, has started taking prevagen. Patient and husband report weight loss, decreased appetite and alteration in taste. Patient is not using supplement at this time, recommended she try to add [employer] or Ensure for extra calories. Patient denies pain.

## 2022-04-11 ENCOUNTER — Ambulatory Visit (INDEPENDENT_AMBULATORY_CARE_PROVIDER_SITE_OTHER): Payer: Medicare Other | Admitting: Internal Medicine

## 2022-04-11 ENCOUNTER — Encounter: Payer: Self-pay | Admitting: Internal Medicine

## 2022-04-11 VITALS — BP 130/60 | HR 57 | Temp 98.4°F | Ht 63.0 in | Wt 125.8 lb

## 2022-04-11 DIAGNOSIS — D126 Benign neoplasm of colon, unspecified: Secondary | ICD-10-CM

## 2022-04-11 DIAGNOSIS — R7303 Prediabetes: Secondary | ICD-10-CM

## 2022-04-11 DIAGNOSIS — R4189 Other symptoms and signs involving cognitive functions and awareness: Secondary | ICD-10-CM | POA: Diagnosis not present

## 2022-04-11 DIAGNOSIS — R419 Unspecified symptoms and signs involving cognitive functions and awareness: Secondary | ICD-10-CM

## 2022-04-11 DIAGNOSIS — I5032 Chronic diastolic (congestive) heart failure: Secondary | ICD-10-CM

## 2022-04-11 DIAGNOSIS — Z1211 Encounter for screening for malignant neoplasm of colon: Secondary | ICD-10-CM

## 2022-04-11 DIAGNOSIS — J4489 Other specified chronic obstructive pulmonary disease: Secondary | ICD-10-CM

## 2022-04-11 DIAGNOSIS — R748 Abnormal levels of other serum enzymes: Secondary | ICD-10-CM

## 2022-04-11 DIAGNOSIS — J449 Chronic obstructive pulmonary disease, unspecified: Secondary | ICD-10-CM

## 2022-04-11 DIAGNOSIS — D6869 Other thrombophilia: Secondary | ICD-10-CM | POA: Diagnosis not present

## 2022-04-11 DIAGNOSIS — I129 Hypertensive chronic kidney disease with stage 1 through stage 4 chronic kidney disease, or unspecified chronic kidney disease: Secondary | ICD-10-CM | POA: Diagnosis not present

## 2022-04-11 DIAGNOSIS — E785 Hyperlipidemia, unspecified: Secondary | ICD-10-CM | POA: Diagnosis not present

## 2022-04-11 DIAGNOSIS — R5383 Other fatigue: Secondary | ICD-10-CM | POA: Diagnosis not present

## 2022-04-11 DIAGNOSIS — C8213 Follicular lymphoma grade II, intra-abdominal lymph nodes: Secondary | ICD-10-CM

## 2022-04-11 LAB — MICROALBUMIN / CREATININE URINE RATIO
Creatinine,U: 36.3 mg/dL
Microalb Creat Ratio: 1.9 mg/g (ref 0.0–30.0)
Microalb, Ur: <0.7 mg/dL (ref 0.0–1.9)

## 2022-04-11 MED ORDER — RSVPREF3 VAC RECOMB ADJUVANTED 120 MCG/0.5ML IM SUSR: 0.5000 mL | Freq: Once | INTRAMUSCULAR | 0 refills | Status: AC

## 2022-04-11 NOTE — Assessment & Plan Note (Signed)
Currently asymptomatic.  Advised to continue use of masks given the RSV threat.  RSV vaccine advised

## 2022-04-11 NOTE — Patient Instructions (Addendum)
Continue  checking  YOUR BP At home and left me know if YOUR readings are PERSISTENTLY HIGHER THAN  140/80   I do recommend the RSV vaccine for you, .  It is now available through the office If you are younger than 22. You can also check with [facility], [facility] and [facility]     Ok to take generic zyrtec or generic allegra  daily for allergies    I highly recommend reading "the End of Alzheimer's: The First Program to Prevent and Reverse Cognitive Decline"  by Brent Bulla, MD

## 2022-04-11 NOTE — Assessment & Plan Note (Signed)
She has no evidence of cognitive dysfunction on exam today, and per husband, she is not making calculation errors or taking longer to complete her work as the [employer] for the [employer].  She does have significant anxiety that may be contributing to her perception of dysfunction by affecting her concentration  Continue lexapro .  Screening for reversible deficiencies will be REPEATED .

## 2022-04-11 NOTE — Assessment & Plan Note (Signed)
Resolved ; continue annual surveillance per Hematology

## 2022-04-11 NOTE — Assessment & Plan Note (Signed)
She is tolerating rosuvastatin and LDL is well below 70.  LFTs normal.  No changes today   Lab Results  Component Value Date   CHOL 152 01/03/2021   HDL 36.00 (L) 01/03/2021   LDLCALC 89 01/03/2021   LDLDIRECT 145.0 04/01/2018   TRIG 134.0 01/03/2021   CHOLHDL 4 01/03/2021

## 2022-04-11 NOTE — Assessment & Plan Note (Addendum)
Improved control on current regimen of amlodipine, Losartan .  Home readings have ranged from 132 to 440 systolic,  no changes today.    RAS ruled out with renal artery doppler.

## 2022-04-11 NOTE — Assessment & Plan Note (Signed)
Referral to [facility] made for 3 year follow up colonoscopy

## 2022-04-11 NOTE — Assessment & Plan Note (Signed)
SHE HAS no swelling on exam today but reports that after Thanksgiving she noticed LE edema.

## 2022-04-11 NOTE — Assessment & Plan Note (Signed)
Secondary to PAF.  Reviewed risk of embolic CVA with patient and reminded  her to continue Eliquis

## 2022-04-11 NOTE — Progress Notes (Signed)
Subjective:  Patient ID: Whitney Molina, female    DOB: [birth year]  Age: 81 y.o. MRN: [id number]  CC: The primary encounter diagnosis was Renal hypertension. Diagnoses of Hyperlipidemia LDL goal <130, Colon cancer screening, Prediabetes, Other fatigue, Chronic diastolic heart failure ([place]), Chronic obstructive pulmonary disease, unspecified COPD type ([place]), Tubular adenoma of colon, Cognitive changes, Cognitive complaints with normal neuropsychological exam, Acquired thrombophilia ([place]), COPD with chronic bronchitis, and Follicular lymphoma grade ii, intra-abdominal lymph nodes ([place]) were also pertinent to this visit.   HPI Whitney Molina presents for follow up on chronic conditions  Chief Complaint  Patient presents with   Follow-up    6 month follow up    1) Hypertension: patient checks blood pressure daily  at home.  Readings have been for the most part <  140/80 at rest  the lowest systolic noted is 742 . Patient is following a reduced salt diet most days and is taking medications as prescribed amlodipine 5 mg , lOsartan 100 mg daily)  2) COPD/ chronic bronchitis: she has had  no recent exacerbations.  Has not had the RSV vaccine or heard about it.   3) allergic rhinitis;  asking if  ok to take zyrtec daily.    4) TA of colon removed in 2020 ,was causing obstipation due to location.  3 yr follow up due .  Needs   referral to [facility]  5) cognitive changes.  Tried prevagen ,  symptoms worsened .  Worries about it getting worse. Forgetting names only.   Outpatient Medications Prior to Visit  Medication Sig Dispense Refill   albuterol (VENTOLIN HFA) 108 (90 Base) MCG/ACT inhaler INHALE 1 PUFFS INTO THE LUNGS EVERY 6 HOURS AS NEEDED FOR WHEEZING OR SHORTNESS OF BREATH 18 g 2   amiodarone (PACERONE) 200 MG tablet Take 1 tablet (200 mg total) by mouth daily. 90 tablet 3   amLODipine (NORVASC) 5 MG tablet Take 1 tablet (5 mg total) by mouth daily. 90 tablet 1   apixaban  (ELIQUIS) 2.5 MG TABS tablet Take 1 tablet (2.5 mg total) by mouth 2 (two) times daily. 180 tablet 1   Apoaequorin (PREVAGEN PO) Take by mouth.     cetirizine (ZYRTEC) 10 MG chewable tablet Chew 10 mg by mouth daily.     Cholecalciferol (VITAMIN D3) 1000 units CAPS Take 1,000 Units by mouth daily.     Co-Enzyme Q10 200 MG CAPS Take 1 tablet by mouth. Taking 1 every other day     hyoscyamine (LEVBID) 0.375 MG 12 hr tablet TAKE 1 TABLET BY MOUTH ONCE DAILY AS DIRECTED 90 tablet 0   losartan (COZAAR) 100 MG tablet TAKE 1 TABLET BY MOUTH ONCE DAILY 90 tablet 1   pantoprazole (PROTONIX) 40 MG tablet Take 40 mg by mouth daily.     rosuvastatin (CRESTOR) 20 MG tablet TAKE 1 TABLET BY MOUTH AT BEDTIME 90 tablet 1   No facility-administered medications prior to visit.    Review of Systems;  Patient denies headache, fevers, malaise, unintentional weight loss, skin rash, eye pain, sinus congestion and sinus pain, sore throat, dysphagia,  hemoptysis , cough, dyspnea, wheezing, chest pain, palpitations, orthopnea, edema, abdominal pain, nausea, melena, diarrhea, constipation, flank pain, dysuria, hematuria, urinary  Frequency, nocturia, numbness, tingling, seizures,  Focal weakness, Loss of consciousness,  Tremor, insomnia, depression, anxiety, and suicidal ideation.      Objective:  BP 130/60   Pulse (!) 57   Temp 98.4 F (36.9 C) (Oral)  Ht _0  (1.6 m)   Wt 125 lb 12.8 oz (57.1 kg)   SpO2 97%   BMI 22.28 kg/m   BP Readings from Last 3 Encounters:  04/11/22 130/60  02/27/22 (!) 157/82  01/27/22 135/69    Wt Readings from Last 3 Encounters:  04/11/22 125 lb 12.8 oz (57.1 kg)  02/27/22 126 lb 12.8 oz (57.5 kg)  01/27/22 127 lb (57.6 kg)    General appearance: alert, cooperative and appears stated age Ears: normal TM's and external ear canals both ears Throat: lips, mucosa, and tongue normal; teeth and gums normal Neck: no adenopathy, no carotid bruit, supple, symmetrical, trachea  midline and thyroid not enlarged, symmetric, no tenderness/mass/nodules Back: symmetric, no curvature. ROM normal. No CVA tenderness. Lungs: clear to auscultation bilaterally Heart: regular rate and rhythm, S1, S2 normal, no murmur, click, rub or gallop Abdomen: soft, non-tender; bowel sounds normal; no masses,  no organomegaly Pulses: 2+ and symmetric Skin: Skin color, texture, turgor normal. No rashes or lesions Lymph nodes: Cervical, supraclavicular, and axillary nodes normal. Neuro:  awake and interactive with normal mood and affect. Higher cortical functions are normal. Speech is clear without word-finding difficulty or dysarthria. Extraocular movements are intact. Visual fields of both eyes are grossly intact. Sensation to light touch is grossly intact bilaterally of upper and lower extremities. Motor examination shows 4+/5 symmetric hand grip and upper extremity and 5/5 lower extremity strength. There is no pronation or drift. Gait is non-ataxic   Lab Results  Component Value Date   HGBA1C 5.5 10/05/2021   HGBA1C 6.3 04/20/2020   HGBA1C 5.9 04/01/2018    Lab Results  Component Value Date   CREATININE 1.10 (H) 02/27/2022   CREATININE 1.16 (H) 08/26/2021   CREATININE 1.04 04/06/2021    Lab Results  Component Value Date   WBC 5.7 02/27/2022   HGB 12.7 02/27/2022   HCT 37.3 02/27/2022   PLT 207 02/27/2022   GLUCOSE 92 02/27/2022   CHOL 152 01/03/2021   TRIG 134.0 01/03/2021   HDL 36.00 (L) 01/03/2021   LDLDIRECT 145.0 04/01/2018   LDLCALC 89 01/03/2021   ALT 43 02/27/2022   AST 33 02/27/2022   NA 138 02/27/2022   K 3.7 02/27/2022   CL 105 02/27/2022   CREATININE 1.10 (H) 02/27/2022   BUN 13 02/27/2022   CO2 28 02/27/2022   TSH 1.210 05/10/2021   INR 1.02 03/24/2015   HGBA1C 5.5 10/05/2021   MICROALBUR <0.7 04/11/2022    Abdomen 1 view (KUB)  Result Date: 01/29/2022 CLINICAL DATA:  Left-sided abdominal pain, history of kidney stones EXAM: ABDOMEN - 1 VIEW  COMPARISON:  07/27/2021 FINDINGS: Scattered large and small bowel gas is noted. Retained fecal material is noted within the left colon consistent with mild constipation. No definitive renal or ureteral stones are seen. Phleboliths are noted within pelvis. Mild degenerative changes of the lumbar spine are noted. IMPRESSION: No definitive stones are seen. Changes of colonic constipation. Electronically Signed   By: Inez Catalina M.D.   On: 01/29/2022 01:07    Assessment & Plan:   Problem List Items Addressed This Visit     Acquired thrombophilia ([place])    Secondary to PAF.  Reviewed risk of embolic CVA with patient and reminded  her to continue Eliquis       Chronic diastolic heart failure ([place])    SHE HAS no swelling on exam today but reports that after Thanksgiving she noticed LE edema.  Cognitive complaints with normal neuropsychological exam    She has no evidence of cognitive dysfunction on exam today, and per husband, she is not making calculation errors or taking longer to complete her work as the [employer] for the [employer].  She does have significant anxiety that may be contributing to her perception of dysfunction by affecting her concentration  Continue lexapro .  Screening for reversible deficiencies will be REPEATED .        RESOLVED: Colon cancer screening   COPD with chronic bronchitis    Currently asymptomatic.  Advised to continue use of masks given the RSV threat.  RSV vaccine advised       Relevant Medications   cetirizine (ZYRTEC) 10 MG chewable tablet   Follicular lymphoma grade ii, intra-abdominal lymph nodes ([facility])    Resolved ; continue annual surveillance per Hematology      Relevant Medications   cetirizine (ZYRTEC) 10 MG chewable tablet   Hyperlipidemia LDL goal <130    She is tolerating rosuvastatin and LDL is well below 70.  LFTs normal.  No changes today   Lab Results  Component Value Date   CHOL 152 01/03/2021   HDL 36.00 (L) 01/03/2021    LDLCALC 89 01/03/2021   LDLDIRECT 145.0 04/01/2018   TRIG 134.0 01/03/2021   CHOLHDL 4 01/03/2021        Relevant Orders   Lipid Profile   Direct LDL   Prediabetes   Relevant Orders   Comp Met (CMET)   HgB A1c   Renal hypertension - Primary    Improved control on current regimen of amlodipine, Losartan .  Home readings have ranged from 856 to 314 systolic,  no changes today.    RAS ruled out with renal artery doppler.        Relevant Orders   Urine Microalbumin w/creat. ratio (Completed)   Comp Met (CMET)   Tubular adenoma of colon    Referral to [facility] made for 3 year follow up colonoscopy      Relevant Orders   Ambulatory referral to Gastroenterology   Other Visit Diagnoses     Other fatigue       Relevant Orders   TSH   Chronic obstructive pulmonary disease, unspecified COPD type ([facility])   (Chronic)     Relevant Medications   cetirizine (ZYRTEC) 10 MG chewable tablet   Cognitive changes       Relevant Orders   B12 and Folate Panel       I spent a total of  34 minutes with this patient in a face to face visit on the date of this encounter reviewing the last office visit with me,   most recent visit with oncology, cardiology and urology , patient's diet and exercise habits, home blood pressure  readings, and post visit ordering of testing and therapeutics.    Follow-up: No follow-ups on file.   [name], MD

## 2022-04-12 LAB — COMPREHENSIVE METABOLIC PANEL
ALT: 55 U/L — ABNORMAL HIGH (ref 0–35)
AST: 43 U/L — ABNORMAL HIGH (ref 0–37)
Albumin: 4.5 g/dL (ref 3.5–5.2)
Alkaline Phosphatase: 88 U/L (ref 39–117)
BUN: 14 mg/dL (ref 6–23)
CO2: 31 mEq/L (ref 19–32)
Calcium: 9.1 mg/dL (ref 8.4–10.5)
Chloride: 102 mEq/L (ref 96–112)
Creatinine, Ser: 1.01 mg/dL (ref 0.40–1.20)
GFR: 52.5 mL/min — ABNORMAL LOW (ref >60.00)
Glucose, Bld: 83 mg/dL (ref 70–99)
Potassium: 3.6 mEq/L (ref 3.5–5.1)
Sodium: 141 mEq/L (ref 135–145)
Total Bilirubin: 0.6 mg/dL (ref 0.2–1.2)
Total Protein: 7.3 g/dL (ref 6.0–8.3)

## 2022-04-12 LAB — LIPID PANEL
Cholesterol: 147 mg/dL (ref 0–200)
HDL: 58.4 mg/dL (ref >39.00)
LDL Cholesterol: 70 mg/dL (ref 0–99)
NonHDL: 88.89
Total CHOL/HDL Ratio: 3
Triglycerides: 92 mg/dL (ref 0.0–149.0)
VLDL: 18.4 mg/dL (ref 0.0–40.0)

## 2022-04-12 LAB — B12 AND FOLATE PANEL
Folate: 19.4 ng/mL (ref >5.9)
Vitamin B-12: 500 pg/mL (ref 211–911)

## 2022-04-12 LAB — TSH: TSH: 0.71 u[IU]/mL (ref 0.35–5.50)

## 2022-04-12 LAB — LDL CHOLESTEROL, DIRECT: Direct LDL: 68 mg/dL

## 2022-04-12 LAB — HEMOGLOBIN A1C: Hgb A1c MFr Bld: 5.9 % (ref 4.6–6.5)

## 2022-04-12 NOTE — Addendum Note (Signed)
Addended by: [name] on: 04/12/2022 09:51 PM   Modules accepted: Orders

## 2022-04-24 ENCOUNTER — Other Ambulatory Visit: Payer: Self-pay | Admitting: Internal Medicine

## 2022-05-11 ENCOUNTER — Other Ambulatory Visit: Payer: Self-pay | Admitting: Internal Medicine

## 2022-07-25 ENCOUNTER — Other Ambulatory Visit: Payer: Self-pay

## 2022-07-25 MED ORDER — ROSUVASTATIN CALCIUM 20 MG PO TABS: 20.0000 mg | ORAL_TABLET | Freq: Every day | ORAL | 3 refills | Status: DC

## 2022-07-27 ENCOUNTER — Other Ambulatory Visit: Payer: Self-pay | Admitting: Family Medicine

## 2022-07-27 DIAGNOSIS — N200 Calculus of kidney: Secondary | ICD-10-CM

## 2022-07-27 NOTE — Progress Notes (Unsigned)
07/28/2022 7:31 PM   Whitney Molina 09-05-1940 [id number]  Referring provider: [name], MD [place],  [place] [address]  Urological history: 1.  Left renal cyst -CTU 04/2021 - very large left parapelvic cyst, measuring at least 13.0 x 9.4 cm and a 3 cm left upper pole cyst  2. High risk hematuria -non-smoker -CTU 04/2021 -renal cysts -cysto 04/2021 - NED  -no reports of gross heme  3.  Nephrolithiasis -Stone composition calcium oxalate 95% -ESWL 06/16/2021  Chief Complaint  Patient presents with   Nephrolithiasis     HPI: Whitney Molina is a 82 y.o. who presents today for a 6 month follow up.   KUB no stone seen.  She has not any issues with urination.  Patient denies any modifying or aggravating factors.  Patient denies any gross hematuria, dysuria or suprapubic/flank pain.  Patient denies any fevers, chills, nausea or vomiting.    UA yellow clear, specific gravity 1.015, trace blood, pH 7.0, 1+ protein, 4.0 urobilinogen, trace leukocyte, 6-10 WBCs, 0-2 RBCs, 0-10 epithelial cells, 0-10 renal epithelial cells, granular casts present, hyaline casts present, mucus threads present, bacteria moderate.  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   [facility] at [facility]    Surgical History: Past Surgical History:  Procedure Laterality Date   ABDOMINAL HYSTERECTOMY  1984    BLADDER SUSPENSION  2010   CARDIOVERSION N/A 11/30/2020   Procedure: CARDIOVERSION;  Surgeon: [name], MD;  Location: [facility] ORS;  Service: Cardiovascular;  Laterality: N/A;   CHOLECYSTECTOMY  06-01-03   COLONOSCOPY  2003   EXTRACORPOREAL SHOCK WAVE LITHOTRIPSY Left 06/16/2021   Procedure: EXTRACORPOREAL SHOCK WAVE LITHOTRIPSY (ESWL);  Surgeon: Hollice Espy, MD;  Location: [facility] ORS;  Service: Urology;  Laterality: Left;   LITHOTRIPSY  2005   local exicsion skin cancer  2004   squamous cell of chest wall. left chest   OOPHORECTOMY     TONSILLECTOMY  1971   TUBAL LIGATION  1975   VAGINAL PROLAPSE REPAIR  July 2001   Pelvic Prolapse    Home Medications:  Current Outpatient Medications on File Prior to Visit  Medication Sig Dispense Refill   albuterol (VENTOLIN HFA) 108 (90 Base) MCG/ACT inhaler INHALE 1 PUFFS INTO THE LUNGS EVERY 6 HOURS AS NEEDED FOR WHEEZING OR SHORTNESS OF BREATH 18 g 2   amiodarone (PACERONE) 200 MG tablet Take 1 tablet (200 mg total) by mouth daily. 90 tablet 3   amLODipine (NORVASC) 5 MG tablet TAKE 1 TABLET BY MOUTH ONCE DAILY 90 tablet 1   apixaban (ELIQUIS) 2.5 MG TABS tablet Take 1 tablet (2.5 mg total) by mouth 2 (two) times daily. 180 tablet 1   Apoaequorin (PREVAGEN PO) Take by mouth.     cetirizine (ZYRTEC) 10 MG chewable tablet Chew  10 mg by mouth daily.     Cholecalciferol (VITAMIN D3) 1000 units CAPS Take 1,000 Units by mouth daily.     Co-Enzyme Q10 200 MG CAPS Take 1 tablet by mouth. Taking 1 every other day     hyoscyamine (LEVBID) 0.375 MG 12 hr tablet TAKE 1 TABLET BY MOUTH ONCE DAILY AS DIRECTED 90 tablet 0   losartan (COZAAR) 100 MG tablet TAKE 1 TABLET BY MOUTH ONCE DAILY 90 tablet 1   pantoprazole (PROTONIX) 40 MG tablet Take 40 mg by mouth daily.     rosuvastatin (CRESTOR) 20 MG tablet Take 1 tablet (20 mg total) by mouth at bedtime. 90 tablet 3   No current facility-administered medications on file prior to visit.    Allergies: No  Known Allergies  Family History: Family History  Problem Relation Age of Onset   Stroke Mother    Breast cancer Mother 32   Stroke Father    Dementia Father    Cancer Brother        bladder   Stroke Brother    Heart attack Brother    Breast cancer Paternal Aunt 53   Bipolar disorder Son     Social History:  reports that she has never smoked. She has never used smokeless tobacco. She reports that she does not drink alcohol and does not use drugs.  ROS: Pertinent ROS in HPI  Physical Exam: BP (!) 159/71   Pulse 69   Ht 5\' 3"  (1.6 m)   Wt 116 lb (52.6 kg)   BMI 20.55 kg/m   Constitutional:  Well nourished. Alert and oriented, No acute distress. HEENT: [place] AT, moist mucus membranes.  Trachea midline Cardiovascular: No clubbing, cyanosis, or edema. Respiratory: Normal respiratory effort, no increased work of breathing. Neurologic: Grossly intact, no focal deficits, moving all 4 extremities. Psychiatric: Normal mood and affect.    Laboratory Data: Urinalysis   See EPIC and HPI I have reviewed the labs.     Pertinent Imaging: Narrative & Impression  CLINICAL DATA:  Kidney stone   EXAM: ABDOMEN - 1 VIEW   COMPARISON:  01/27/2022   FINDINGS: The bowel gas pattern is normal. No radio-opaque calculi or other significant radiographic abnormality are seen. Cholecystectomy clips are seen.   IMPRESSION: Negative. Noncontrast CT can be done to evaluate for nephrolithiasis more definitively.     Electronically Signed   By: Sammie Bench M.D.   On: 07/30/2022 10:12   I have independently reviewed the films.  See HPI.   Assessment & Plan:    1. Nephrolithiasis -no stone seen on today's Xray -no micro heme  Return in about 1 year (around 07/28/2023) for UA and KUB.  These notes generated with voice recognition software. I apologize for typographical errors.  [place], [place] [address]  [place], [place] [address] [phone]

## 2022-07-28 ENCOUNTER — Ambulatory Visit: Payer: Medicare Other | Admitting: Urology

## 2022-07-28 ENCOUNTER — Ambulatory Visit
Admission: RE | Admit: 2022-07-28 | Discharge: 2022-07-28 | Disposition: A | Discharge status: Home / Self Care | Payer: Medicare Other | Source: Ambulatory Visit | Attending: Urology | Admitting: Urology

## 2022-07-28 ENCOUNTER — Ambulatory Visit: Admit: 2022-07-28 | Payer: Medicare Other | Admitting: Urology

## 2022-07-28 ENCOUNTER — Encounter: Payer: Self-pay | Admitting: Urology

## 2022-07-28 VITALS — BP 159/71 | HR 69 | Ht 63.0 in | Wt 116.0 lb

## 2022-07-28 DIAGNOSIS — Z87442 Personal history of urinary calculi: Secondary | ICD-10-CM

## 2022-07-28 DIAGNOSIS — N200 Calculus of kidney: Secondary | ICD-10-CM

## 2022-07-28 LAB — MICROSCOPIC EXAMINATION

## 2022-07-28 LAB — URINALYSIS, COMPLETE
Bilirubin, UA: NEGATIVE
Glucose, UA: NEGATIVE
Ketones, UA: NEGATIVE
Nitrite, UA: NEGATIVE
Specific Gravity, UA: 1.015 (ref 1.005–1.030)
Urobilinogen, Ur: 4 mg/dL — ABNORMAL HIGH (ref 0.2–1.0)
pH, UA: 7 (ref 5.0–7.5)

## 2022-08-15 ENCOUNTER — Telehealth: Payer: Self-pay | Admitting: Cardiology

## 2022-08-15 NOTE — Telephone Encounter (Signed)
Refill request

## 2022-08-15 NOTE — Telephone Encounter (Signed)
*  STAT* If patient is at the pharmacy, call can be transferred to refill team.   1. Which medications need to be refilled? (please list name of each medication and dose if known) apixaban (ELIQUIS) 2.5 MG TABS tablet   2. Which pharmacy/location (including street and city if local pharmacy) is medication to be sent to?   [place] they need a 30 day or 90 day supply? [place]

## 2022-08-16 MED ORDER — APIXABAN 2.5 MG PO TABS: 2.5000 mg | ORAL_TABLET | Freq: Two times a day (BID) | ORAL | 1 refills | Status: DC

## 2022-08-16 NOTE — Telephone Encounter (Signed)
Prescription refill request for Eliquis received. Indication: AF Last office visit: 11/08/21  B Agbor-Etang MD Scr: 1.01 on 04/11/22  Epic Age: 82 Weight: 60kg  Based on above findings Eliquis 2.5mg  twice daily is the appropriate dose.  Refill approved.

## 2022-08-29 ENCOUNTER — Inpatient Hospital Stay: Payer: Medicare Other | Admitting: Internal Medicine

## 2022-08-29 ENCOUNTER — Other Ambulatory Visit: Payer: Self-pay

## 2022-08-29 ENCOUNTER — Telehealth: Payer: Self-pay

## 2022-08-29 ENCOUNTER — Inpatient Hospital Stay: Payer: Medicare Other | Attending: Internal Medicine

## 2022-08-29 VITALS — BP 148/70 | HR 60 | Temp 97.9°F | Ht 63.5 in | Wt 118.0 lb

## 2022-08-29 DIAGNOSIS — Z803 Family history of malignant neoplasm of breast: Secondary | ICD-10-CM | POA: Diagnosis not present

## 2022-08-29 DIAGNOSIS — C8213 Follicular lymphoma grade II, intra-abdominal lymph nodes: Secondary | ICD-10-CM | POA: Diagnosis not present

## 2022-08-29 DIAGNOSIS — K5900 Constipation, unspecified: Secondary | ICD-10-CM | POA: Insufficient documentation

## 2022-08-29 DIAGNOSIS — Z8052 Family history of malignant neoplasm of bladder: Secondary | ICD-10-CM | POA: Diagnosis not present

## 2022-08-29 DIAGNOSIS — Z7901 Long term (current) use of anticoagulants: Secondary | ICD-10-CM | POA: Diagnosis not present

## 2022-08-29 DIAGNOSIS — Z79899 Other long term (current) drug therapy: Secondary | ICD-10-CM | POA: Insufficient documentation

## 2022-08-29 DIAGNOSIS — I4891 Unspecified atrial fibrillation: Secondary | ICD-10-CM | POA: Diagnosis not present

## 2022-08-29 LAB — COMPREHENSIVE METABOLIC PANEL
ALT: 37 U/L (ref 0–44)
AST: 33 U/L (ref 15–41)
Albumin: 4.1 g/dL (ref 3.5–5.0)
Alkaline Phosphatase: 80 U/L (ref 38–126)
Anion gap: 9 (ref 5–15)
BUN: 21 mg/dL (ref 8–23)
CO2: 25 mmol/L (ref 22–32)
Calcium: 8.4 mg/dL — ABNORMAL LOW (ref 8.9–10.3)
Chloride: 102 mmol/L (ref 98–111)
Creatinine, Ser: 1.19 mg/dL — ABNORMAL HIGH (ref 0.44–1.00)
GFR, Estimated: 46 mL/min — ABNORMAL LOW (ref >60)
Glucose, Bld: 90 mg/dL (ref 70–99)
Potassium: 2.9 mmol/L — ABNORMAL LOW (ref 3.5–5.1)
Sodium: 136 mmol/L (ref 135–145)
Total Bilirubin: 0.7 mg/dL (ref 0.3–1.2)
Total Protein: 7.1 g/dL (ref 6.5–8.1)

## 2022-08-29 LAB — CBC WITH DIFFERENTIAL/PLATELET
Abs Immature Granulocytes: 0.03 10*3/uL (ref 0.00–0.07)
Basophils Absolute: 0.1 10*3/uL (ref 0.0–0.1)
Basophils Relative: 1 %
Eosinophils Absolute: 0.1 10*3/uL (ref 0.0–0.5)
Eosinophils Relative: 1 %
HCT: 33.6 % — ABNORMAL LOW (ref 36.0–46.0)
Hemoglobin: 11.4 g/dL — ABNORMAL LOW (ref 12.0–15.0)
Immature Granulocytes: 0 %
Lymphocytes Relative: 15 %
Lymphs Abs: 1.4 10*3/uL (ref 0.7–4.0)
MCH: 32.6 pg (ref 26.0–34.0)
MCHC: 33.9 g/dL (ref 30.0–36.0)
MCV: 96 fL (ref 80.0–100.0)
Monocytes Absolute: 0.8 10*3/uL (ref 0.1–1.0)
Monocytes Relative: 9 %
Neutro Abs: 6.7 10*3/uL (ref 1.7–7.7)
Neutrophils Relative %: 74 %
Platelets: 190 10*3/uL (ref 150–400)
RBC: 3.5 MIL/uL — ABNORMAL LOW (ref 3.87–5.11)
RDW: 13.4 % (ref 11.5–15.5)
WBC: 9 10*3/uL (ref 4.0–10.5)
nRBC: 0 % (ref 0.0–0.2)

## 2022-08-29 LAB — IRON AND TIBC
Iron: 35 ug/dL (ref 28–170)
Saturation Ratios: 11 % (ref 10.4–31.8)
TIBC: 312 ug/dL (ref 250–450)
UIBC: 277 ug/dL

## 2022-08-29 LAB — LACTATE DEHYDROGENASE: LDH: 186 U/L (ref 98–192)

## 2022-08-29 LAB — FERRITIN: Ferritin: 88 ng/mL (ref 11–307)

## 2022-08-29 MED ORDER — POTASSIUM CHLORIDE CRYS ER 20 MEQ PO TBCR: EXTENDED_RELEASE_TABLET | ORAL | 0 refills | Status: DC

## 2022-08-29 NOTE — Addendum Note (Signed)
Addended by: Jim Like on: 08/29/2022 03:15 PM   Modules accepted: Orders

## 2022-08-29 NOTE — Progress Notes (Signed)
[facility] OFFICE PROGRESS NOTE  Patient Care Team: Sherlene Shams, MD as PCP - General (Internal Medicine) Debbe Odea, MD as PCP - Cardiology (Cardiology) Lanier Prude, MD as PCP - Electrophysiology (Cardiology) Lemar Livings Merrily Pew, MD (General Surgery)   Cancer Staging  No matching staging information was found for the patient.   Oncology History Overview Note   # NOV 2016- FOLLICULAR LYMPHOMA G-2; [s/p Bx- RETROPERITONEAL/LEFT Peri-aortic LN ~ 2.5CM [incidental on CT scan in 2016; CT- Nov 2014- ~1CM]/ PET 2016-Left Peri-aortic LN; Suv ~15; Ext Iliac LN 6 mm; Feb 2017- Unchanged LN; START Rituxan q week x4 [finished march 15th]; PET JAN 30th 2018- NED.   # Hx of Shingles [Sep 2016]-   # Kidney cysts [previous Dr.Wolfe]; Afib on eliquis [Dr.Brian; [facility]]  DIAGNOSIS: Follicle lymphoma-G-2  STAGE: Stage II       ;GOALS: Control  CURRENT/MOST RECENT THERAPY: Surveillance    Follicular lymphoma grade ii, intra-abdominal lymph nodes    INTERVAL HISTORY:Accompanied by husband.  Walk independently.   Whitney Molina 82 y.o.  female pleasant patient above history of stage II follicular lymphoma-grade 2 is here for follow-up.  Intermittent constipation for which patient has been taking laxatives.  Which gives her multiple loose stools.   Patient is also on Eliquis.  For A. fib.  Mild overall weight loss.  Otherwise no night sweats.  Review of Systems  Constitutional:  Positive for malaise/fatigue and weight loss. Negative for chills, diaphoresis and fever.  HENT:  Negative for nosebleeds and sore throat.   Eyes:  Negative for double vision.  Respiratory:  Negative for cough, hemoptysis, sputum production, shortness of breath and wheezing.   Cardiovascular:  Negative for chest pain, palpitations, orthopnea and leg swelling.  Gastrointestinal:  Negative for abdominal pain, blood in stool, constipation, diarrhea, heartburn, melena, nausea and  vomiting.  Genitourinary:  Negative for dysuria, frequency and urgency.  Musculoskeletal:  Negative for back pain and joint pain.  Skin: Negative.  Negative for itching and rash.  Neurological:  Negative for dizziness, tingling, focal weakness, weakness and headaches.  Endo/Heme/Allergies:  Does not bruise/bleed easily.  Psychiatric/Behavioral:  Negative for depression. The patient is not nervous/anxious and does not have insomnia.       PAST MEDICAL HISTORY :  Past Medical History:  Diagnosis Date   Basal cell carcinoma of nose    Bronchitis    GERD (gastroesophageal reflux disease)    Heart murmur    Hemorrhoids    History of basal cell carcinoma    right upper lip, right ant shoulder   History of colon polyps    History of dysplastic nevus 12/05/2001   left paraspinal upper back, upper back spinal   History of kidney stones    History of mammogram 2016   History of squamous cell carcinoma 02/07/2005   left ant chest   Hyperlipidemia    Hypertension    Non Hodgkin's lymphoma ([facility])    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   [facility] at [facility]    PAST SURGICAL HISTORY :   Past Surgical History:  Procedure Laterality Date   ABDOMINAL HYSTERECTOMY  1984   BLADDER SUSPENSION  2010   CARDIOVERSION N/A 11/30/2020   Procedure: CARDIOVERSION;  Surgeon: Debbe Odea, MD;  Location: [facility] ORS;  Service: Cardiovascular;  Laterality: N/A;   CHOLECYSTECTOMY  06-01-03   COLONOSCOPY  2003   EXTRACORPOREAL SHOCK WAVE LITHOTRIPSY Left 06/16/2021   Procedure: EXTRACORPOREAL SHOCK WAVE  LITHOTRIPSY (ESWL);  Surgeon: Vanna Scotland, MD;  Location: [facility] ORS;  Service: Urology;  Laterality: Left;   LITHOTRIPSY  2005   local exicsion skin cancer  2004   squamous cell of chest wall. left chest   OOPHORECTOMY     TONSILLECTOMY  1971   TUBAL LIGATION  1975   VAGINAL PROLAPSE REPAIR  July 2001   Pelvic Prolapse    FAMILY HISTORY :   Family History  Problem Relation Age  of Onset   Stroke Mother    Breast cancer Mother 55   Stroke Father    Dementia Father    Cancer Brother        bladder   Stroke Brother    Heart attack Brother    Breast cancer Paternal Aunt 14   Bipolar disorder Son     SOCIAL HISTORY:   Social History   Tobacco Use   Smoking status: Never   Smokeless tobacco: Never  Vaping Use   Vaping Use: Never used  Substance Use Topics   Alcohol use: No   Drug use: No    ALLERGIES:  has No Known Allergies.  MEDICATIONS:  Current Outpatient Medications  Medication Sig Dispense Refill   albuterol (VENTOLIN HFA) 108 (90 Base) MCG/ACT inhaler INHALE 1 PUFFS INTO THE LUNGS EVERY 6 HOURS AS NEEDED FOR WHEEZING OR SHORTNESS OF BREATH 18 g 2   amiodarone (PACERONE) 200 MG tablet Take 1 tablet (200 mg total) by mouth daily. 90 tablet 3   amLODipine (NORVASC) 5 MG tablet TAKE 1 TABLET BY MOUTH ONCE DAILY 90 tablet 1   apixaban (ELIQUIS) 2.5 MG TABS tablet Take 1 tablet (2.5 mg total) by mouth 2 (two) times daily. 180 tablet 1   Apoaequorin (PREVAGEN PO) Take by mouth.     cetirizine (ZYRTEC) 10 MG chewable tablet Chew 10 mg by mouth daily.     Cholecalciferol (VITAMIN D3) 1000 units CAPS Take 1,000 Units by mouth daily.     Co-Enzyme Q10 200 MG CAPS Take 1 tablet by mouth. Taking 1 every other day     losartan (COZAAR) 100 MG tablet TAKE 1 TABLET BY MOUTH ONCE DAILY 90 tablet 1   pantoprazole (PROTONIX) 40 MG tablet Take 40 mg by mouth daily.     potassium chloride SA (KLOR-CON M) 20 MEQ tablet 1 pill twice a day 30 tablet 0   hyoscyamine (LEVBID) 0.375 MG 12 hr tablet TAKE 1 TABLET BY MOUTH ONCE DAILY AS DIRECTED (Patient not taking: Reported on 08/29/2022) 90 tablet 0   rosuvastatin (CRESTOR) 20 MG tablet Take 1 tablet (20 mg total) by mouth at bedtime. 90 tablet 3   No current facility-administered medications for this visit.    PHYSICAL EXAMINATION: ECOG PERFORMANCE STATUS: 0 - Asymptomatic  BP (!) 148/70 (BP Location: Right Arm,  Patient Position: Sitting, Cuff Size: Normal)   Pulse 60   Temp 97.9 F (36.6 C) (Tympanic)   Ht 5' 3.5" (1.613 m)   Wt 118 lb (53.5 kg)   SpO2 99%   BMI 20.57 kg/m   Filed Weights   08/29/22 1428  Weight: 118 lb (53.5 kg)    Physical Exam HENT:     Head: Normocephalic and atraumatic.     Mouth/Throat:     Pharynx: No oropharyngeal exudate.  Eyes:     Pupils: Pupils are equal, round, and reactive to light.  Cardiovascular:     Rate and Rhythm: Normal rate. Rhythm irregular.  Pulmonary:     Effort:  Pulmonary effort is normal. No respiratory distress.     Breath sounds: Normal breath sounds. No wheezing.  Abdominal:     General: Bowel sounds are normal. There is no distension.     Palpations: Abdomen is soft. There is no mass.     Tenderness: There is no abdominal tenderness. There is no guarding or rebound.  Musculoskeletal:        General: No tenderness. Normal range of motion.     Cervical back: Normal range of motion and neck supple.  Skin:    General: Skin is warm.  Neurological:     Mental Status: She is alert and oriented to person, place, and time.  Psychiatric:        Mood and Affect: Affect normal.     LABORATORY DATA:  I have reviewed the data as listed    Component Value Date/Time   NA 136 08/29/2022 1415   NA 138 09/10/2013 0758   K 2.9 (L) 08/29/2022 1415   K 3.3 (L) 09/10/2013 0758   CL 102 08/29/2022 1415   CL 100 09/10/2013 0758   CO2 25 08/29/2022 1415   CO2 32 09/10/2013 0758   GLUCOSE 90 08/29/2022 1415   GLUCOSE 85 09/10/2013 0758   BUN 21 08/29/2022 1415   BUN 13 09/10/2013 0758   CREATININE 1.19 (H) 08/29/2022 1415   CREATININE 0.96 12/22/2013 0857   CALCIUM 8.4 (L) 08/29/2022 1415   CALCIUM 9.2 09/10/2013 0758   PROT 7.1 08/29/2022 1415   PROT 8.3 (H) 09/10/2013 0758   ALBUMIN 4.1 08/29/2022 1415   ALBUMIN 3.5 09/10/2013 0758   AST 33 08/29/2022 1415   AST 23 09/10/2013 0758   ALT 37 08/29/2022 1415   ALT 28 09/10/2013 0758    ALKPHOS 80 08/29/2022 1415   ALKPHOS 78 09/10/2013 0758   BILITOT 0.7 08/29/2022 1415   BILITOT 0.6 09/10/2013 0758   GFRNONAA 46 (L) 08/29/2022 1415   GFRNONAA 59 (L) 12/22/2013 0857   GFRAA >60 01/21/2020 1309   GFRAA 68 12/22/2013 0857    No results found for: "SPEP", "UPEP"  Lab Results  Component Value Date   WBC 9.0 08/29/2022   NEUTROABS 6.7 08/29/2022   HGB 11.4 (L) 08/29/2022   HCT 33.6 (L) 08/29/2022   MCV 96.0 08/29/2022   PLT 190 08/29/2022      Chemistry      Component Value Date/Time   NA 136 08/29/2022 1415   NA 138 09/10/2013 0758   K 2.9 (L) 08/29/2022 1415   K 3.3 (L) 09/10/2013 0758   CL 102 08/29/2022 1415   CL 100 09/10/2013 0758   CO2 25 08/29/2022 1415   CO2 32 09/10/2013 0758   BUN 21 08/29/2022 1415   BUN 13 09/10/2013 0758   CREATININE 1.19 (H) 08/29/2022 1415   CREATININE 0.96 12/22/2013 0857      Component Value Date/Time   CALCIUM 8.4 (L) 08/29/2022 1415   CALCIUM 9.2 09/10/2013 0758   ALKPHOS 80 08/29/2022 1415   ALKPHOS 78 09/10/2013 0758   AST 33 08/29/2022 1415   AST 23 09/10/2013 0758   ALT 37 08/29/2022 1415   ALT 28 09/10/2013 0758   BILITOT 0.7 08/29/2022 1415   BILITOT 0.6 09/10/2013 0758       RADIOGRAPHIC STUDIES: I have personally reviewed the radiological images as listed and agreed with the findings in the report. No results found.   ASSESSMENT & PLAN:  Follicular lymphoma grade ii, intra-abdominal lymph nodes ([facility]) #  RETROPERITONEAL LN [incidental/asymptomatic]- follicular lymphoma grade 2; likely stage II; status post rituximab. FEB 2019 PET scan NED- Clinically no evidence recurrence. will get scans on clinical basis; stable.  # hypokalemia- 2.9- ? Laxatives/causing diarrhea. Recommend Kdur 20 meq BID x15 days. Repeat labs with PCP in 1 month.   # CKD- III-GFR 53-stable  [s/p Dr.Kolluru nephrology]   # A.fib- on eliquis- 2.5 mg stable.   # Tremors-benign versus others- off levbid- ? Memory issues-defer  to PCP- stable.   # Hypocalcemia- ca- 8.7; on  ca+vit D- stable.   # DISPOSITION: # add iron studies/ferritin-  # follow up in 6 months-MD/ labs-cbc/cmp,ldh-Dr.B  Cc: Dr.Tullo   No orders of the defined types were placed in this encounter.  All questions were answered. The patient knows to call the clinic with any problems, questions or concerns.      Earna Coder, MD 08/29/2022 2:56 PM

## 2022-08-29 NOTE — Progress Notes (Signed)
No complaints

## 2022-08-29 NOTE — Assessment & Plan Note (Addendum)
#   RETROPERITONEAL LN [incidental/asymptomatic]- follicular lymphoma grade 2; likely stage II; status post rituximab. FEB 2019 PET scan NED- Clinically no evidence recurrence. will get scans on clinical basis; stable.  # hypokalemia- 2.9- ? Laxatives/causing diarrhea. Recommend Kdur 20 meq BID x15 days. Repeat labs with PCP in 1 month.   # CKD- III-GFR 53-stable  [s/p Dr.Kolluru nephrology]   # A.fib- on eliquis- 2.5 mg stable.   # Tremors-benign versus others- off levbid- ? Memory issues-defer to PCP- stable.   # Hypocalcemia- ca- 8.7; on  ca+vit D- stable.   # DISPOSITION: # add iron studies/ferritin-  # follow up in 6 months-MD/ labs-cbc/cmp,ldh-Dr.B  Cc: Dr.Tullo

## 2022-08-29 NOTE — Telephone Encounter (Signed)
E scribe system down. Faxed potassium Rx to [facility]

## 2022-09-23 ENCOUNTER — Other Ambulatory Visit: Payer: Self-pay | Admitting: Internal Medicine

## 2022-09-28 DIAGNOSIS — K5903 Drug induced constipation: Secondary | ICD-10-CM | POA: Diagnosis not present

## 2022-09-28 DIAGNOSIS — D126 Benign neoplasm of colon, unspecified: Secondary | ICD-10-CM | POA: Diagnosis not present

## 2022-09-28 DIAGNOSIS — R1032 Left lower quadrant pain: Secondary | ICD-10-CM | POA: Diagnosis not present

## 2022-10-03 ENCOUNTER — Other Ambulatory Visit: Payer: Self-pay | Admitting: Internal Medicine

## 2022-10-03 NOTE — Telephone Encounter (Signed)
Refilled: 09/25/2022 Last OV: 04/11/2022 Next OV: 10/31/2022  Last Potassium level: 2.9 one month ago

## 2022-10-03 NOTE — Telephone Encounter (Signed)
Prescription Request  10/03/2022  LOV: 04/11/2022  What is the name of the medication or equipment? potassium chloride SA (KLOR-CON M) 20 MEQ tablet  Have you contacted your pharmacy to request a refill? Yes   Which pharmacy would you like this sent to?   [facility] [place], [place] - [address] [place] [address] Phone: [phone] Fax: [phone]    Patient notified that their request is being sent to the clinical staff for review and that they should receive a response within 2 business days.   Please advise at [facility] [phone]

## 2022-10-06 ENCOUNTER — Other Ambulatory Visit: Payer: Self-pay | Admitting: Internal Medicine

## 2022-10-11 ENCOUNTER — Telehealth: Payer: Self-pay

## 2022-10-11 NOTE — Telephone Encounter (Signed)
LMTCB. Need to let pt know that this medication is prescribed by her cardiologist and she will need to reach out to them for the refill.

## 2022-10-11 NOTE — Telephone Encounter (Signed)
Prescription Request  10/11/2022  LOV: Visit date not found  What is the name of the medication or equipment?  amiodarone (PACERONE) 200 MG tablet   Have you contacted your pharmacy to request a refill? No   Which pharmacy would you like this sent to?   [facility] [place], [place] - [address] [place] [address] Phone: [phone] Fax: [phone]    Patient notified that their request is being sent to the clinical staff for review and that they should receive a response within 2 business days.   Please advise at Mobile [phone] (mobile)  Patient states she is out of the medication.

## 2022-10-13 ENCOUNTER — Other Ambulatory Visit: Payer: Self-pay | Admitting: Internal Medicine

## 2022-10-13 NOTE — Telephone Encounter (Signed)
LMTCB

## 2022-10-14 ENCOUNTER — Other Ambulatory Visit: Payer: Self-pay | Admitting: Internal Medicine

## 2022-10-16 ENCOUNTER — Telehealth: Payer: Self-pay | Admitting: Cardiology

## 2022-10-16 MED ORDER — AMIODARONE HCL 200 MG PO TABS: 200.0000 mg | ORAL_TABLET | Freq: Every day | ORAL | 0 refills | Status: DC

## 2022-10-16 NOTE — Telephone Encounter (Signed)
Requested Prescriptions   Signed Prescriptions Disp Refills   amiodarone (PACERONE) 200 MG tablet 90 tablet 0    Sig: Take 1 tablet (200 mg total) by mouth daily.    Authorizing Provider: Debbe Odea    Ordering User: Kendrick Fries

## 2022-10-16 NOTE — Telephone Encounter (Signed)
noted 

## 2022-10-16 NOTE — Telephone Encounter (Signed)
Patient called and note was read. 

## 2022-10-16 NOTE — Telephone Encounter (Signed)
Pt is aware that she needs to call cardiology to have rx refilled.

## 2022-10-16 NOTE — Telephone Encounter (Signed)
  Component Ref Range & Units 1 mo ago (08/29/22) 6 mo ago (04/11/22) 7 mo ago (02/27/22) 1 yr ago (08/26/21) 1 yr ago (04/06/21) 1 yr ago (02/04/21) 1 yr ago (01/03/21)  Potassium 3.5 - 5.1 mmol/L 2.9 Low  3.6 R 3.7 4.1 3.3 Low  R 3.6 3.5 R

## 2022-10-16 NOTE — Telephone Encounter (Signed)
*  STAT* If patient is at the pharmacy, call can be transferred to refill team.   1. Which medications need to be refilled? (please list name of each medication and dose if known) Amiodarone, 200mg   2. Which pharmacy/location (including street and city if local pharmacy) is medication to be sent to? [facility] [place]  3. Do they need a 30 day or 90 day supply? 90 day

## 2022-10-26 ENCOUNTER — Other Ambulatory Visit: Payer: Self-pay | Admitting: Internal Medicine

## 2022-10-31 ENCOUNTER — Ambulatory Visit (INDEPENDENT_AMBULATORY_CARE_PROVIDER_SITE_OTHER): Payer: Medicare Other | Admitting: Internal Medicine

## 2022-10-31 ENCOUNTER — Encounter: Payer: Self-pay | Admitting: Internal Medicine

## 2022-10-31 VITALS — BP 142/66 | HR 61 | Temp 97.7°F | Ht 63.5 in | Wt 116.0 lb

## 2022-10-31 DIAGNOSIS — D126 Benign neoplasm of colon, unspecified: Secondary | ICD-10-CM | POA: Diagnosis not present

## 2022-10-31 DIAGNOSIS — E876 Hypokalemia: Secondary | ICD-10-CM | POA: Diagnosis not present

## 2022-10-31 DIAGNOSIS — I129 Hypertensive chronic kidney disease with stage 1 through stage 4 chronic kidney disease, or unspecified chronic kidney disease: Secondary | ICD-10-CM | POA: Diagnosis not present

## 2022-10-31 DIAGNOSIS — M810 Age-related osteoporosis without current pathological fracture: Secondary | ICD-10-CM | POA: Diagnosis not present

## 2022-10-31 DIAGNOSIS — E785 Hyperlipidemia, unspecified: Secondary | ICD-10-CM

## 2022-10-31 DIAGNOSIS — D6869 Other thrombophilia: Secondary | ICD-10-CM

## 2022-10-31 DIAGNOSIS — I480 Paroxysmal atrial fibrillation: Secondary | ICD-10-CM | POA: Diagnosis not present

## 2022-10-31 DIAGNOSIS — R4189 Other symptoms and signs involving cognitive functions and awareness: Secondary | ICD-10-CM | POA: Diagnosis not present

## 2022-10-31 DIAGNOSIS — R630 Anorexia: Secondary | ICD-10-CM

## 2022-10-31 DIAGNOSIS — C8213 Follicular lymphoma grade II, intra-abdominal lymph nodes: Secondary | ICD-10-CM | POA: Diagnosis not present

## 2022-10-31 DIAGNOSIS — Z1231 Encounter for screening mammogram for malignant neoplasm of breast: Secondary | ICD-10-CM

## 2022-10-31 LAB — COMPREHENSIVE METABOLIC PANEL
ALT: 49 U/L — ABNORMAL HIGH (ref 0–35)
AST: 38 U/L — ABNORMAL HIGH (ref 0–37)
Albumin: 4 g/dL (ref 3.5–5.2)
Alkaline Phosphatase: 79 U/L (ref 39–117)
BUN: 19 mg/dL (ref 6–23)
CO2: 27 mEq/L (ref 19–32)
Calcium: 8.8 mg/dL (ref 8.4–10.5)
Chloride: 103 mEq/L (ref 96–112)
Creatinine, Ser: 1.12 mg/dL (ref 0.40–1.20)
GFR: 46.2 mL/min — ABNORMAL LOW (ref >60.00)
Glucose, Bld: 74 mg/dL (ref 70–99)
Potassium: 3.9 mEq/L (ref 3.5–5.1)
Sodium: 138 mEq/L (ref 135–145)
Total Bilirubin: 0.6 mg/dL (ref 0.2–1.2)
Total Protein: 6.9 g/dL (ref 6.0–8.3)

## 2022-10-31 LAB — LDL CHOLESTEROL, DIRECT: Direct LDL: 53 mg/dL

## 2022-10-31 LAB — TSH: TSH: 0.48 u[IU]/mL (ref 0.35–5.50)

## 2022-10-31 LAB — LIPID PANEL
Cholesterol: 129 mg/dL (ref 0–200)
HDL: 53 mg/dL (ref >39.00)
LDL Cholesterol: 60 mg/dL (ref 0–99)
NonHDL: 76.18
Total CHOL/HDL Ratio: 2
Triglycerides: 80 mg/dL (ref 0.0–149.0)
VLDL: 16 mg/dL (ref 0.0–40.0)

## 2022-10-31 LAB — MAGNESIUM: Magnesium: 1.9 mg/dL (ref 1.5–2.5)

## 2022-10-31 LAB — B12 AND FOLATE PANEL
Folate: 12.9 ng/mL (ref >5.9)
Vitamin B-12: 688 pg/mL (ref 211–911)

## 2022-10-31 MED ORDER — AMLODIPINE BESYLATE 5 MG PO TABS: 5.0000 mg | ORAL_TABLET | Freq: Every day | ORAL | 1 refills | Status: DC

## 2022-10-31 MED ORDER — MIRTAZAPINE 15 MG PO TABS: ORAL_TABLET | ORAL | 2 refills | Status: DC

## 2022-10-31 MED ORDER — LOSARTAN POTASSIUM 100 MG PO TABS: 100.0000 mg | ORAL_TABLET | Freq: Every day | ORAL | 1 refills | Status: DC

## 2022-10-31 NOTE — Progress Notes (Signed)
Subjective:  Patient ID: Whitney Molina, female    DOB: [birth year]  Age: 82 y.o. MRN: [id number]  CC: The primary encounter diagnosis was Encounter for screening mammogram for malignant neoplasm of breast. Diagnoses of Hypokalemia, Hyperlipidemia LDL goal <130, Cognitive changes, Follicular lymphoma grade ii, intra-abdominal lymph nodes (HCC), Age-related osteoporosis without current pathological fracture, Paroxysmal atrial fibrillation (HCC), Renal hypertension, Acquired thrombophilia (HCC), Tubular adenoma of colon, and Loss of appetite were also pertinent to this visit.   HPI Whitney Molina presents for  Chief Complaint  Patient presents with   Medical Management of Chronic Issues   LAST SEEN IN [place].   1) hypokalemia:  Saw oncology in April , potassium was 2.9,  (" attributed to laxative use")  did not return for repeat assessment by labs.  Still taking potassium  (refilled recently by Dr Donneta Romberg)      2) Tubular adenoma:  she was referred to [facility] for colon Ca screening .  He has advised her to defer additional colonoscopy due to age.   3) Memory issues :  forgetting names. 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 having trouble adapting to changes . Son wants her to switch to use software , she prefers to remain  "old school" on her bookkeeping.   Also notes .  Some environmental changes:  their youngest son and son's wife havemoved back home along with 3 young sons, all < 29 yrs old. .  The house is clutered and not peaceful anymore.  She has  increased chores with  3 young boys in the house  for the past 3 -4 months   4) Weight loss :  she reports lack of appetite and loss of sense of taste since "since I started taking the medication"  and has lost 20 lbs since 2022.  Weight has been stable for the past 9 months.       Outpatient Medications Prior to Visit  Medication Sig  Dispense Refill   albuterol (VENTOLIN HFA) 108 (90 Base) MCG/ACT inhaler INHALE 1 PUFFS INTO THE LUNGS EVERY 6 HOURS AS NEEDED FOR WHEEZING OR SHORTNESS OF BREATH 18 g 2   amiodarone (PACERONE) 200 MG tablet Take 1 tablet (200 mg total) by mouth daily. 90 tablet 0   apixaban (ELIQUIS) 2.5 MG TABS tablet Take 1 tablet (2.5 mg total) by mouth 2 (two) times daily. 180 tablet 1   Apoaequorin (PREVAGEN PO) Take by mouth.     cetirizine (ZYRTEC) 10 MG chewable tablet Chew 10 mg by mouth daily.     Cholecalciferol (VITAMIN D3) 1000 units CAPS Take 1,000 Units by mouth daily.     Co-Enzyme Q10 200 MG CAPS Take 1 tablet by mouth. Taking 1 every other day     pantoprazole (PROTONIX) 40 MG tablet Take 40 mg by mouth daily.     potassium chloride SA (KLOR-CON M) 20 MEQ tablet TAKE 1 TABLET BY MOUTH TWICE DAILY 30 tablet 0   rosuvastatin (CRESTOR) 20 MG tablet Take 1 tablet (20 mg total) by mouth at bedtime. 90 tablet 3   amLODipine (NORVASC) 5 MG tablet TAKE 1 TABLET BY MOUTH ONCE DAILY 90 tablet 1   losartan (COZAAR) 100 MG tablet TAKE 1 TABLET BY MOUTH ONCE DAILY 90 tablet 0   hyoscyamine (LEVBID) 0.375 MG 12 hr tablet TAKE 1 TABLET BY MOUTH ONCE DAILY AS DIRECTED (Patient not taking: Reported on 10/31/2022) 90  tablet 0   No facility-administered medications prior to visit.    Review of Systems;  Patient denies headache, fevers, malaise, unintentional weight loss, skin rash, eye pain, sinus congestion and sinus pain, sore throat, dysphagia,  hemoptysis , cough, dyspnea, wheezing, chest pain, palpitations, orthopnea, edema, abdominal pain, nausea, melena, diarrhea, constipation, flank pain, dysuria, hematuria, urinary  Frequency, nocturia, numbness, tingling, seizures,  Focal weakness, Loss of consciousness,  Tremor, insomnia, depression, anxiety, and suicidal ideation.      Objective:  BP (!) 142/66   Pulse 61   Temp 97.7 F (36.5 C) (Oral)   Ht 5' 3.5" (1.613 m)   Wt 116 lb (52.6 kg)   SpO2 97%    BMI 20.23 kg/m   BP Readings from Last 3 Encounters:  10/31/22 (!) 142/66  08/29/22 (!) 148/70  07/28/22 (!) 159/71    Wt Readings from Last 3 Encounters:  10/31/22 116 lb (52.6 kg)  08/29/22 118 lb (53.5 kg)  07/28/22 116 lb (52.6 kg)    Physical Exam Vitals reviewed.  Constitutional:      General: She is not in acute distress.    Appearance: Normal appearance. She is normal weight. She is not ill-appearing, toxic-appearing or diaphoretic.  HENT:     Head: Normocephalic.  Eyes:     General: No scleral icterus.       Right eye: No discharge.        Left eye: No discharge.     Conjunctiva/sclera: Conjunctivae normal.  Cardiovascular:     Rate and Rhythm: Normal rate and regular rhythm.     Heart sounds: Normal heart sounds.  Pulmonary:     Effort: Pulmonary effort is normal. No respiratory distress.     Breath sounds: Normal breath sounds.  Musculoskeletal:        General: Normal range of motion.  Skin:    General: Skin is warm and dry.  Neurological:     General: No focal deficit present.     Mental Status: She is alert and oriented to person, place, and time. Mental status is at baseline.  Psychiatric:        Mood and Affect: Mood normal.        Behavior: Behavior normal.        Thought Content: Thought content normal.        Judgment: Judgment normal.   Lab Results  Component Value Date   HGBA1C 5.9 04/11/2022   HGBA1C 5.5 10/05/2021   HGBA1C 6.3 04/20/2020    Lab Results  Component Value Date   CREATININE 1.19 (H) 08/29/2022   CREATININE 1.01 04/11/2022   CREATININE 1.10 (H) 02/27/2022    Lab Results  Component Value Date   WBC 9.0 08/29/2022   HGB 11.4 (L) 08/29/2022   HCT 33.6 (L) 08/29/2022   PLT 190 08/29/2022   GLUCOSE 90 08/29/2022   CHOL 147 04/11/2022   TRIG 92.0 04/11/2022   HDL 58.40 04/11/2022   LDLDIRECT 68.0 04/11/2022   LDLCALC 70 04/11/2022   ALT 37 08/29/2022   AST 33 08/29/2022   NA 136 08/29/2022   K 2.9 (L) 08/29/2022    CL 102 08/29/2022   CREATININE 1.19 (H) 08/29/2022   BUN 21 08/29/2022   CO2 25 08/29/2022   TSH 0.71 04/11/2022   INR 1.02 03/24/2015   HGBA1C 5.9 04/11/2022   MICROALBUR <0.7 04/11/2022    Abdomen 1 view (KUB)  Result Date: 07/30/2022 CLINICAL DATA:  Kidney stone EXAM: ABDOMEN - 1 VIEW COMPARISON:  01/27/2022 FINDINGS: The bowel gas pattern is normal. No radio-opaque calculi or other significant radiographic abnormality are seen. Cholecystectomy clips are seen. IMPRESSION: Negative. Noncontrast CT can be done to evaluate for nephrolithiasis more definitively. Electronically Signed   By: Layla Maw M.D.   On: 07/30/2022 10:12    Assessment & Plan:  .Encounter for screening mammogram for malignant neoplasm of breast -     3D Screening Mammogram, Left and Right; Future  Hypokalemia Assessment & Plan: Unclear  etiology,  presumed to be due to laxative use In April.  Has been taking supplements daily since then.  Checking mg level and repeat k   Orders: -     Comprehensive metabolic panel -     Magnesium  Hyperlipidemia LDL goal <130 -     Lipid panel -     LDL cholesterol, direct  Cognitive changes -     B12 and Folate Panel -     TSH  Follicular lymphoma grade ii, intra-abdominal lymph nodes (HCC) Assessment & Plan: Resolved ; continue annual surveillance per Hematology.   Age-related osteoporosis without current pathological fracture Assessment & Plan: Untreated per patient prefernce.  She declines follow up DEXA due to the positioning of her knee by the tech causing moderate pain requiring orthopedic referral   Paroxysmal atrial fibrillation [facility]) Assessment & Plan: Echocardiogram 07/2020 normal systolic function, EF 60 to 65%, normal LA size. DCCV 11/2020 unsuccessful, follow-up in clinic with A. fib, amiodarone started.  Currently in sinus rhythm continue  amiodarone and Lopressor 25 mg twice daily, Eliquis 5 mg twice daily for CHADS score of 4 .  She is  reminded to follow up with cardiology regularly to avoid lapses in medications   Renal hypertension Assessment & Plan: Improved control on current regimen of amlodipine, Losartan .  Home readings have ranged from 120 to 140 systolic,  no changes today.    RAS ruled out with renal artery doppler.     Acquired thrombophilia (HCC) Assessment & Plan: Secondary to PAF.  Reviewed risk of embolic CVA with patient and reminded  her to continue Eliquis .  She is tolerating use of Eliquis with occasional bruising of arms due to yarwork.  Reassurance provided.she  is advised to notify her specialists prior to any procedure that may required suspension of Eliquis     Tubular adenoma of colon Assessment & Plan: Referral to [facility] made for 3 year follow up colonoscopy was done:  Dr Norma Fredrickson suggested that screening stop due to age    Loss of appetite Assessment & Plan: Likely side effect of chemo for treatment of follicular lymphoma .  Trial of Remeron .  Return one month    Other orders -     amLODIPine Besylate; Take 1 tablet (5 mg total) by mouth daily.  Dispense: 90 tablet; Refill: 1 -     Losartan Potassium; Take 1 tablet (100 mg total) by mouth daily.  Dispense: 90 tablet; Refill: 1 -     Mirtazapine; 1/2 to 1 tablet 30 minutes before bedtime daily  Dispense: 30 tablet; Refill: 2     I provided 38  minutes of face-to-face time during this encounter reviewing patient's last visit with me, patient's  most recent visit with cardiology,  nephrology,  and oncology,   recent surgical and non surgical procedures, previous  labs and imaging studies, counseling on currently addressed issues,  and post visit ordering to diagnostics and therapeutics .   Follow-up: Return in about 4  weeks (around 11/28/2022).   Sherlene Shams, MD

## 2022-10-31 NOTE — Assessment & Plan Note (Signed)
Secondary to PAF.  Reviewed risk of embolic CVA with patient and reminded  her to continue Eliquis .  She is tolerating use of Eliquis with occasional bruising of arms due to yarwork.  Reassurance provided.she  is advised to notify her specialists prior to any procedure that may required suspension of Eliquis

## 2022-10-31 NOTE — Assessment & Plan Note (Signed)
Resolved ; continue annual surveillance per Hematology 

## 2022-10-31 NOTE — Assessment & Plan Note (Signed)
Referral to [facility] made for 3 year follow up colonoscopy was done:  Dr Norma Fredrickson suggested that screening stop due to age

## 2022-10-31 NOTE — Assessment & Plan Note (Signed)
Untreated per patient prefernce.  She declines follow up DEXA due to the positioning of her knee by the tech causing moderate pain requiring orthopedic referral 

## 2022-10-31 NOTE — Patient Instructions (Addendum)
I am starting you on a medication to help your poor appetite    It is called mirtazapine (or Remeron)  .  Start with 7.5 mg daily (1/2 tablet )  at 9 PM .  Return in one month and we will increase the dose if needed.    Continue to lubricate your right eye frequently with Systane,  Refresh

## 2022-10-31 NOTE — Assessment & Plan Note (Signed)
Improved control on current regimen of amlodipine, Losartan .  Home readings have ranged from 120 to 140 systolic,  no changes today.    RAS ruled out with renal artery doppler.   

## 2022-10-31 NOTE — Assessment & Plan Note (Signed)
Unclear  etiology,  presumed to be due to laxative use In April.  Has been taking supplements daily since then.  Checking mg level and repeat k

## 2022-10-31 NOTE — Assessment & Plan Note (Signed)
Likely side effect of chemo for treatment of follicular lymphoma .  Trial of Remeron .  Return one month

## 2022-10-31 NOTE — Assessment & Plan Note (Signed)
Echocardiogram 07/2020 normal systolic function, EF 60 to 65%, normal LA size. DCCV 11/2020 unsuccessful, follow-up in clinic with A. fib, amiodarone started.  Currently in sinus rhythm continue  amiodarone and Lopressor 25 mg twice daily, Eliquis 5 mg twice daily for CHADS score of 4 .  She is reminded to follow up with cardiology regularly to avoid lapses in medications

## 2022-11-02 ENCOUNTER — Telehealth: Payer: Self-pay

## 2022-11-02 NOTE — Telephone Encounter (Signed)
LMTCB in regards to lab results.  

## 2022-11-02 NOTE — Telephone Encounter (Signed)
-----   Message from Eulis Foster, FNP sent at 11/02/2022  9:36 AM EDT ----- Labs stable

## 2022-11-13 ENCOUNTER — Other Ambulatory Visit: Payer: Self-pay | Admitting: Internal Medicine

## 2022-11-13 NOTE — Telephone Encounter (Signed)
Component Ref Range & Units 13 d ago (10/31/22) 2 mo ago (08/29/22) 7 mo ago (04/11/22) 8 mo ago (02/27/22) 1 yr ago (08/26/21) 1 yr ago (04/06/21) 1 yr ago (02/04/21)  Potassium 3.5 - 5.1 mEq/L 3.9 2.9 Low  R 3.6 3.7 R 4.1 R 3.3 Low  3.6 R

## 2022-11-15 ENCOUNTER — Ambulatory Visit: Payer: Medicare Other | Attending: Medical | Admitting: Medical

## 2022-11-15 ENCOUNTER — Encounter: Payer: Self-pay | Admitting: Medical

## 2022-11-15 VITALS — BP 110/68 | HR 59 | Ht 63.0 in | Wt 117.2 lb

## 2022-11-15 DIAGNOSIS — I10 Essential (primary) hypertension: Secondary | ICD-10-CM | POA: Diagnosis not present

## 2022-11-15 DIAGNOSIS — I480 Paroxysmal atrial fibrillation: Secondary | ICD-10-CM | POA: Diagnosis not present

## 2022-11-15 DIAGNOSIS — E782 Mixed hyperlipidemia: Secondary | ICD-10-CM

## 2022-11-15 DIAGNOSIS — R7989 Other specified abnormal findings of blood chemistry: Secondary | ICD-10-CM

## 2022-11-15 NOTE — Progress Notes (Signed)
Cardiology Office Note:    Date:  11/15/2022   ID:  Whitney Molina, DOB January 10, 1941, MRN [id number]  PCP:  Sherlene Shams, MD  [facility] Cardiologist:  Debbe Odea, MD  [facility] Electrophysiologist:  Lanier Prude, MD   Referring MD: Sherlene Shams, MD   Chief Complaint: 1 year follow-up  History of Present Illness:    Whitney Molina is a 82 y.o. female with a hx of HTN, HLD, CKD stage 3, and paroxysmal afib on Eliquis who presents for 1 year follow-up   First seen 07/09/20 for afib, referred by PCP. EKG showed afib with rate 120s. Patient was started on Lopressor and Eliquis. Echo showed EF 60-65%, normal LA size.  Last seen 11/26/20 and EKG showed persistent afib with heart rate 106bpm. BP at home showed systolics in the 80s. She was set up for cardioversion. She underwent successful cardioversion 11/30/20. Patient saw EP and was started on amiodarone.   Lat seen 10/2021 and was stable from a cardiac perspective. She was in SR/SB on amiodarone.  Today, the patient is in SB with 1st degree AV block. She denies chest pain, SOB, palpitations, lower leg edema, orthopnea, pnd. She is taking Remeron for sleep and increased appetite. Recent LFTs 49 and 38. She is still working and notices fatigue after work. PCP did labs which were fairly unremarkable. Patient also notes memory issues, which PCP is also following.   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   removed at [facility]    Past Surgical History:  Procedure Laterality Date   ABDOMINAL HYSTERECTOMY  1984   BLADDER SUSPENSION  2010   CARDIOVERSION N/A 11/30/2020   Procedure: CARDIOVERSION;  Surgeon: Debbe Odea, MD;  Location: [facility] ORS;  Service: Cardiovascular;  Laterality: N/A;   CHOLECYSTECTOMY  06-01-03   COLONOSCOPY  2003   EXTRACORPOREAL SHOCK WAVE LITHOTRIPSY Left 06/16/2021   Procedure: EXTRACORPOREAL SHOCK WAVE LITHOTRIPSY (ESWL);  Surgeon: Vanna Scotland, MD;  Location: [facility] ORS;  Service: Urology;  Laterality: Left;   LITHOTRIPSY  2005   local exicsion skin cancer  2004   squamous cell of chest wall. left chest   OOPHORECTOMY     TONSILLECTOMY  1971   TUBAL LIGATION  1975   VAGINAL PROLAPSE REPAIR  July 2001   Pelvic Prolapse    Current Medications: Current Meds  Medication Sig   albuterol (VENTOLIN HFA) 108 (90 Base) MCG/ACT inhaler INHALE 1 PUFFS INTO THE LUNGS EVERY 6 HOURS AS NEEDED FOR WHEEZING OR SHORTNESS OF BREATH   amiodarone (PACERONE) 200 MG tablet Take 1 tablet (200 mg total) by mouth daily.   amLODipine (NORVASC) 5 MG tablet Take 1 tablet (5 mg total) by mouth daily.   apixaban (ELIQUIS) 2.5 MG TABS tablet Take 1 tablet (2.5 mg total) by  mouth 2 (two) times daily.   Apoaequorin (PREVAGEN PO) Take by mouth.   cetirizine (ZYRTEC) 10 MG chewable tablet Chew 10 mg by mouth daily.   Cholecalciferol (VITAMIN D3) 1000 units CAPS Take 1,000 Units by mouth daily.   Co-Enzyme Q10 200 MG CAPS Take 1 tablet by mouth. Taking 1 every other day   losartan (COZAAR) 100 MG tablet Take 1 tablet (100 mg total) by mouth daily.   pantoprazole (PROTONIX) 40 MG tablet Take 40 mg by mouth daily.   potassium chloride SA (KLOR-CON M) 20 MEQ tablet TAKE 1 TABLET BY MOUTH TWICE DAILY   rosuvastatin (CRESTOR) 20 MG tablet Take 1 tablet (20 mg total) by mouth at bedtime.     Allergies:   Patient has no known allergies.   Social  History   Socioeconomic History   Marital status: Married    Spouse name: Not on file   Number of children: Not on file   Years of education: Not on file   Highest education level: Not on file  Occupational History   Not on file  Tobacco Use   Smoking status: Never   Smokeless tobacco: Never  Vaping Use   Vaping Use: Never used  Substance and Sexual Activity   Alcohol use: No   Drug use: No   Sexual activity: Yes  Other Topics Concern   Not on file  Social History Narrative   Not on file   Social Determinants of Health   Financial Resource Strain: Low Risk  (09/15/2020)   Overall Financial Resource Strain (CARDIA)    Difficulty of Paying Living Expenses: Not hard at all  Food Insecurity: No Food Insecurity (09/15/2020)   Hunger Vital Sign    Worried About Running Out of Food in the Last Year: Never true    Ran Out of Food in the Last Year: Never true  Transportation Needs: No Transportation Needs (09/15/2020)   PRAPARE - Administrator, Civil Service (Medical): No    Lack of Transportation (Non-Medical): No  Physical Activity: Insufficiently Active (09/15/2020)   Exercise Vital Sign    Days of Exercise per Week: 4 days    Minutes of Exercise per Session: 20 min  Stress: No Stress Concern Present (09/15/2020)   [employer] of Occupational Health - Occupational Stress Questionnaire    Feeling of Stress : Not at all  Social Connections: Unknown (09/15/2020)   Social Connection and Isolation Panel [NHANES]    Frequency of Communication with Friends and Family: Not on file    Frequency of Social Gatherings with Friends and Family: Not on file    Attends Religious Services: Not on file    Active Member of Clubs or Organizations: Not on file    Attends Banker Meetings: Not on file    Marital Status: Married     Family History: The patient's family history includes Bipolar disorder in her son; Breast cancer (age of onset: 67) in her paternal aunt;  Breast cancer (age of onset: 64) in her mother; Cancer in her brother; Dementia in her father; Heart attack in her brother; Stroke in her brother, father, and mother.  ROS:   Please see the history of present illness.     All other systems reviewed and are negative.  EKGs/Labs/Other Studies Reviewed:    The following studies were reviewed today:  Echo 07/2020 1. Left ventricular ejection fraction, by estimation, is 60 to 65%. The  left ventricle has normal function. The left  ventricle has no regional  wall motion abnormalities. Left ventricular diastolic parameters are  consistent with Grade II diastolic  dysfunction (pseudonormalization).   2. Right ventricular systolic function is normal. The right ventricular  size is normal. There is normal pulmonary artery systolic pressure. The  estimated right ventricular systolic pressure is 29.6 mmHg.   3. The mitral valve is normal in structure. Moderate mitral valve  regurgitation. No evidence of mitral stenosis.   4. Tricuspid valve regurgitation is mild to moderate.   5. Aortic valve regurgitation is mild.   6. Rhythm is normal sinus   EKG:  EKG is ordered today.  The ekg ordered today demonstrates SB 59bpm, 1st degree AV block , TWI V1 and V2  Recent Labs: 08/29/2022: Hemoglobin 11.4; Platelets 190 10/31/2022: ALT 49; BUN 19; Creatinine, Ser 1.12; Magnesium 1.9; Potassium 3.9; Sodium 138; TSH 0.48  Recent Lipid Panel    Component Value Date/Time   CHOL 129 10/31/2022 1203   TRIG 80.0 10/31/2022 1203   HDL 53.00 10/31/2022 1203   CHOLHDL 2 10/31/2022 1203   VLDL 16.0 10/31/2022 1203   LDLCALC 60 10/31/2022 1203   LDLDIRECT 53.0 10/31/2022 1203    Physical Exam:    VS:  BP 110/68 (BP Location: Left Arm, Patient Position: Sitting, Cuff Size: Normal)   Pulse (!) 59   Ht 5\' 3"  (1.6 m)   Wt 117 lb 4 oz (53.2 kg)   SpO2 97%   BMI 20.77 kg/m     Wt Readings from Last 3 Encounters:  11/15/22 117 lb 4 oz (53.2 kg)  10/31/22  116 lb (52.6 kg)  08/29/22 118 lb (53.5 kg)     GEN:  Well nourished, well developed in no acute distress HEENT: Normal NECK: No JVD; No carotid bruits LYMPHATICS: No lymphadenopathy CARDIAC: RRR, no murmurs, rubs, gallops RESPIRATORY:  Clear to auscultation without rales, wheezing or rhonchi  ABDOMEN: Soft, non-tender, non-distended MUSCULOSKELETAL:  No edema; No deformity  SKIN: Warm and dry NEUROLOGIC:  Alert and oriented x 3 PSYCHIATRIC:  Normal affect   ASSESSMENT:    1. Paroxysmal A-fib (HCC)   2. Primary hypertension   3. Hyperlipidemia, mixed   4. Abnormal LFTs    PLAN:    In order of problems listed above:  Paroxysmal Afib S/p DCCV 11/2020. She is in SB on amiodarone. Continue amiodarone 200mg  daily. CHADSVASC of 4. She is on Eliquis 2.5mg  BID (age and weight).   HTN BP is well controlled. Continue Losartan and Toprol.  HLD LDL 53 10/2022. Continue Crestor 20mg  daily.  Abnormal LFTs ALT 49 and AST 38 on most recent labs. Patient is on amiodarone and Crestor. AST/ALT have been mildly elevated in the past. I think we can re-check in 3 months, and if it's still abnormal, can try a statin Holiday.   Disposition: Follow up in 3 month(s) with MD/APP   Signed, Kamali Sakata David Stall, PA-C  11/15/2022 10:52 AM    [facility]

## 2022-11-15 NOTE — Patient Instructions (Signed)
Medication Instructions:  No changes *If you need a refill on your cardiac medications before your next appointment, please call your pharmacy*   Lab Work: None ordered If you have labs (blood work) drawn today and your tests are completely normal, you will receive your results only by: MyChart Message (if you have MyChart) OR A paper copy in the mail If you have any lab test that is abnormal or we need to change your treatment, we will call you to review the results.   Testing/Procedures: None ordered   Follow-Up: At [facility], you and your health needs are our priority.  As part of our continuing mission to provide you with exceptional heart care, we have created designated Provider Care Teams.  These Care Teams include your primary Cardiologist (physician) and Advanced Practice Providers (APPs -  Physician Assistants and Nurse Practitioners) who all work together to provide you with the care you need, when you need it.  We recommend signing up for the patient portal called "MyChart".  Sign up information is provided on this After Visit Summary.  MyChart is used to connect with patients for Virtual Visits (Telemedicine).  Patients are able to view lab/test results, encounter notes, upcoming appointments, etc.  Non-urgent messages can be sent to your provider as well.   To learn more about what you can do with MyChart, go to [email or web address].    Your next appointment:   3 month(s)  Provider:   You may see Debbe Odea, MD or one of the following Advanced Practice Providers on your designated Care Team:   Nicolasa Ducking, NP Eula Listen, PA-C Cadence Fransico Michael, PA-C Charlsie Quest, NP

## 2022-11-28 ENCOUNTER — Other Ambulatory Visit: Payer: Self-pay | Admitting: Internal Medicine

## 2022-11-28 ENCOUNTER — Encounter: Payer: Self-pay | Admitting: Internal Medicine

## 2022-11-28 ENCOUNTER — Ambulatory Visit (INDEPENDENT_AMBULATORY_CARE_PROVIDER_SITE_OTHER): Payer: Medicare Other | Admitting: Internal Medicine

## 2022-11-28 VITALS — BP 121/66 | HR 54 | Temp 98.3°F | Ht 63.0 in | Wt 118.4 lb

## 2022-11-28 DIAGNOSIS — I129 Hypertensive chronic kidney disease with stage 1 through stage 4 chronic kidney disease, or unspecified chronic kidney disease: Secondary | ICD-10-CM

## 2022-11-28 DIAGNOSIS — E785 Hyperlipidemia, unspecified: Secondary | ICD-10-CM | POA: Diagnosis not present

## 2022-11-28 DIAGNOSIS — R630 Anorexia: Secondary | ICD-10-CM | POA: Diagnosis not present

## 2022-11-28 DIAGNOSIS — G3184 Mild cognitive impairment, so stated: Secondary | ICD-10-CM

## 2022-11-28 DIAGNOSIS — F03A4 Unspecified dementia, mild, with anxiety: Secondary | ICD-10-CM

## 2022-11-28 DIAGNOSIS — N1832 Chronic kidney disease, stage 3b: Secondary | ICD-10-CM | POA: Diagnosis not present

## 2022-11-28 NOTE — Progress Notes (Signed)
Subjective:  Patient ID: Whitney Molina, female    DOB: 04/18/1941  Age: 82 y.o. MRN: [id number]  CC: The primary encounter diagnosis was Mild cognitive impairment. Diagnoses of Mild dementia with anxiety, unspecified dementia type (HCC), Hyperlipidemia LDL goal <130, Renal hypertension, Loss of appetite, and Stage 3b chronic kidney disease (HCC) were also pertinent to this visit.   HPI Whitney Molina presents for  Chief Complaint  Patient presents with   Medical Management of Chronic Issues    1 month follow up     1) anorexia with h/o 20 lb weight loss  Patient was seen one month ago and prescribed remeron to manage symptoms of anorexia attributed to prior chemo for lymphoma.  Patient stopped the medication after 3 doses and stopped due to s/e of   excessive sleepiness and increased irritability.     Her weight has been stable.  Situation at home is lessl stressful due to a "full house"   2) HTN:  Hypertension: patient checks blood pressure twice weekly at home.  Readings have been for the most part <130/80 at rest . Patient is following a reduced salt diet most days and is taking medications as prescribed today's reading at home was 121/  66  3) MEMORY LOSS:  PATIENT WAS screened for thyroid and B12 deficiencies last month and returns today for MMSE.       Outpatient Medications Prior to Visit  Medication Sig Dispense Refill   albuterol (VENTOLIN HFA) 108 (90 Base) MCG/ACT inhaler INHALE 1 PUFFS INTO THE LUNGS EVERY 6 HOURS AS NEEDED FOR WHEEZING OR SHORTNESS OF BREATH 18 g 2   amiodarone (PACERONE) 200 MG tablet Take 1 tablet (200 mg total) by mouth daily. 90 tablet 0   amLODipine (NORVASC) 5 MG tablet Take 1 tablet (5 mg total) by mouth daily. 90 tablet 1   apixaban (ELIQUIS) 2.5 MG TABS tablet Take 1 tablet (2.5 mg total) by mouth 2 (two) times daily. 180 tablet 1   Apoaequorin (PREVAGEN PO) Take by mouth.     cetirizine (ZYRTEC) 10 MG chewable tablet Chew 10 mg by  mouth daily.     Cholecalciferol (VITAMIN D3) 1000 units CAPS Take 1,000 Units by mouth daily.     Co-Enzyme Q10 200 MG CAPS Take 1 tablet by mouth. Taking 1 every other day     losartan (COZAAR) 100 MG tablet Take 1 tablet (100 mg total) by mouth daily. 90 tablet 1   pantoprazole (PROTONIX) 40 MG tablet Take 40 mg by mouth daily.     potassium chloride SA (KLOR-CON M) 20 MEQ tablet TAKE 1 TABLET BY MOUTH TWICE DAILY 30 tablet 0   rosuvastatin (CRESTOR) 20 MG tablet Take 1 tablet (20 mg total) by mouth at bedtime. 90 tablet 3   mirtazapine (REMERON) 15 MG tablet 1/2 to 1 tablet 30 minutes before bedtime daily (Patient not taking: Reported on 11/28/2022) 30 tablet 2   No facility-administered medications prior to visit.    Review of Systems;  Patient denies headache, fevers, malaise, unintentional weight loss, skin rash, eye pain, sinus congestion and sinus pain, sore throat, dysphagia,  hemoptysis , cough, dyspnea, wheezing, chest pain, palpitations, orthopnea, edema, abdominal pain, nausea, melena, diarrhea, constipation, flank pain, dysuria, hematuria, urinary  Frequency, nocturia, numbness, tingling, seizures,  Focal weakness, Loss of consciousness,  Tremor, insomnia, depression, anxiety, and suicidal ideation.      Objective:  BP 121/66   Pulse (!) 54   Temp 98.3 F (36.8  C) (Oral)   Ht 5\' 3"  (1.6 m)   Wt 118 lb 6.4 oz (53.7 kg)   SpO2 97%   BMI 20.97 kg/m   BP Readings from Last 3 Encounters:  11/28/22 121/66  11/15/22 110/68  10/31/22 (!) 142/66    Wt Readings from Last 3 Encounters:  11/28/22 118 lb 6.4 oz (53.7 kg)  11/15/22 117 lb 4 oz (53.2 kg)  10/31/22 116 lb (52.6 kg)    Physical Exam Vitals reviewed.  Constitutional:      General: She is not in acute distress.    Appearance: Normal appearance. She is normal weight. She is not ill-appearing, toxic-appearing or diaphoretic.  HENT:     Head: Normocephalic.  Eyes:     General: No scleral icterus.       Right  eye: No discharge.        Left eye: No discharge.     Conjunctiva/sclera: Conjunctivae normal.  Cardiovascular:     Rate and Rhythm: Normal rate and regular rhythm.     Heart sounds: Normal heart sounds.  Pulmonary:     Effort: Pulmonary effort is normal. No respiratory distress.     Breath sounds: Normal breath sounds.  Musculoskeletal:        General: Normal range of motion.  Skin:    General: Skin is warm and dry.  Neurological:     General: No focal deficit present.     Mental Status: She is alert and oriented to person, place, and time. Mental status is at baseline.     Cranial Nerves: Cranial nerves 2-12 are intact.     Sensory: Sensation is intact.     Motor: Motor function is intact.     Comments: MMSE :  21/30 orientation to day/date impaired, concentration impaired Clock drawing impaired   Psychiatric:        Mood and Affect: Mood normal.        Behavior: Behavior normal.        Thought Content: Thought content normal.        Judgment: Judgment normal.    Lab Results  Component Value Date   HGBA1C 5.9 04/11/2022   HGBA1C 5.5 10/05/2021   HGBA1C 6.3 04/20/2020    Lab Results  Component Value Date   CREATININE 1.12 10/31/2022   CREATININE 1.19 (H) 08/29/2022   CREATININE 1.01 04/11/2022    Lab Results  Component Value Date   WBC 9.0 08/29/2022   HGB 11.4 (L) 08/29/2022   HCT 33.6 (L) 08/29/2022   PLT 190 08/29/2022   GLUCOSE 74 10/31/2022   CHOL 129 10/31/2022   TRIG 80.0 10/31/2022   HDL 53.00 10/31/2022   LDLDIRECT 53.0 10/31/2022   LDLCALC 60 10/31/2022   ALT 49 (H) 10/31/2022   AST 38 (H) 10/31/2022   NA 138 10/31/2022   K 3.9 10/31/2022   CL 103 10/31/2022   CREATININE 1.12 10/31/2022   BUN 19 10/31/2022   CO2 27 10/31/2022   TSH 0.48 10/31/2022   INR 1.02 03/24/2015   HGBA1C 5.9 04/11/2022   MICROALBUR <0.7 04/11/2022    Abdomen 1 view (KUB)  Result Date: 07/30/2022 CLINICAL DATA:  Kidney stone EXAM: ABDOMEN - 1 VIEW COMPARISON:   01/27/2022 FINDINGS: The bowel gas pattern is normal. No radio-opaque calculi or other significant radiographic abnormality are seen. Cholecystectomy clips are seen. IMPRESSION: Negative. Noncontrast CT can be done to evaluate for nephrolithiasis more definitively. Electronically Signed   By: Layla Maw M.D.   On:  07/30/2022 10:12    Assessment & Plan:  .Mild cognitive impairment Assessment & Plan: MMSE administered today ; she scored 21/30.  Could not draw a clock face.  Named 7 animals in 60 sec.   Referring to Dr Sherryll Burger. MRI Brain, ordered.  TSH and B12 were normal last month    Mild dementia with anxiety, unspecified dementia type (HCC) -     MR BRAIN WO CONTRAST; Future -     Ambulatory referral to Neurology  Hyperlipidemia LDL goal <130 Assessment & Plan: She is tolerating rosuvastatin and LDL is well below 70.  LFTs normal.  No changes today   Lab Results  Component Value Date   CHOL 129 10/31/2022   HDL 53.00 10/31/2022   LDLCALC 60 10/31/2022   LDLDIRECT 53.0 10/31/2022   TRIG 80.0 10/31/2022   CHOLHDL 2 10/31/2022      Renal hypertension Assessment & Plan: Improved control on current regimen of amlodipine, Losartan .  Home readings have ranged from 120 to 140 systolic,  no changes today.    RAS ruled out with renal artery doppler.     Loss of appetite Assessment & Plan: She did not tolerate trial of remeron and declines further treatment. Appetite has improved and weight loss has stopped    Stage 3b chronic kidney disease [facility]) Assessment & Plan: Stable by Nov 2022 nephrology evaluation .  She is avoiding NSAIDs  Lab Results  Component Value Date   CREATININE 1.12 10/31/2022         I provided 31 minutes of face-to-face time during this encounter reviewing patient's last visit with me,  previous  labs and imaging studies, counseling on currently addressed issues,  and post visit ordering to diagnostics and therapeutics .   Follow-up: No follow-ups  on file.   Sherlene Shams, MD

## 2022-11-28 NOTE — Assessment & Plan Note (Addendum)
MMSE administered today ; she scored 21/30.  Could not draw a clock face.  Named 7 animals in 60 sec.   Referring to Dr Sherryll Burger. MRI Brain, ordered.  TSH and B12 were normal last month

## 2022-11-28 NOTE — Patient Instructions (Addendum)
You do not need the mirtazapine at this time.  If you want to resume mirtazapine,  you should take at night and start with full tablet or 1.5 tablets (not 1/2)   MRI OF BAIN HAS BEEN ORDERED REFERRAL TO DR [facility] AT [facility] IS IN PROGRESS

## 2022-11-29 ENCOUNTER — Ambulatory Visit
Admission: RE | Admit: 2022-11-29 | Discharge: 2022-11-29 | Disposition: A | Discharge status: Home / Self Care | Payer: Medicare Other | Source: Ambulatory Visit | Attending: Internal Medicine | Admitting: Internal Medicine

## 2022-11-29 DIAGNOSIS — Z1231 Encounter for screening mammogram for malignant neoplasm of breast: Secondary | ICD-10-CM | POA: Insufficient documentation

## 2022-11-30 NOTE — Assessment & Plan Note (Signed)
She did not tolerate trial of remeron and declines further treatment. Appetite has improved and weight loss has stopped

## 2022-11-30 NOTE — Assessment & Plan Note (Signed)
Improved control on current regimen of amlodipine, Losartan .  Home readings have ranged from 120 to 140 systolic,  no changes today.    RAS ruled out with renal artery doppler.   

## 2022-11-30 NOTE — Assessment & Plan Note (Signed)
She is tolerating rosuvastatin and LDL is well below 70.  LFTs normal.  No changes today   Lab Results  Component Value Date   CHOL 129 10/31/2022   HDL 53.00 10/31/2022   LDLCALC 60 10/31/2022   LDLDIRECT 53.0 10/31/2022   TRIG 80.0 10/31/2022   CHOLHDL 2 10/31/2022

## 2022-11-30 NOTE — Assessment & Plan Note (Signed)
Stable by Nov 2022 nephrology evaluation .  She is avoiding NSAIDs  Lab Results  Component Value Date   CREATININE 1.12 10/31/2022

## 2022-12-05 ENCOUNTER — Ambulatory Visit
Admission: RE | Admit: 2022-12-05 | Discharge: 2022-12-05 | Disposition: A | Discharge status: Home / Self Care | Payer: Medicare Other | Source: Ambulatory Visit | Attending: Internal Medicine | Admitting: Internal Medicine

## 2022-12-05 DIAGNOSIS — F03A4 Unspecified dementia, mild, with anxiety: Secondary | ICD-10-CM | POA: Diagnosis not present

## 2022-12-05 DIAGNOSIS — G3184 Mild cognitive impairment, so stated: Secondary | ICD-10-CM | POA: Diagnosis not present

## 2022-12-05 DIAGNOSIS — I6782 Cerebral ischemia: Secondary | ICD-10-CM | POA: Diagnosis not present

## 2022-12-05 DIAGNOSIS — F03A0 Unspecified dementia, mild, without behavioral disturbance, psychotic disturbance, mood disturbance, and anxiety: Secondary | ICD-10-CM | POA: Diagnosis not present

## 2022-12-08 NOTE — Assessment & Plan Note (Signed)
Vascular dementia suggested by MRI noting 37 old CVA and chronic small vessel disease.

## 2022-12-18 ENCOUNTER — Other Ambulatory Visit: Payer: Self-pay

## 2022-12-18 MED ORDER — POTASSIUM CHLORIDE CRYS ER 20 MEQ PO TBCR: EXTENDED_RELEASE_TABLET | ORAL | 0 refills | Status: DC

## 2022-12-21 DIAGNOSIS — H43813 Vitreous degeneration, bilateral: Secondary | ICD-10-CM | POA: Diagnosis not present

## 2022-12-21 DIAGNOSIS — H2513 Age-related nuclear cataract, bilateral: Secondary | ICD-10-CM | POA: Diagnosis not present

## 2023-01-02 DIAGNOSIS — H2512 Age-related nuclear cataract, left eye: Secondary | ICD-10-CM | POA: Diagnosis not present

## 2023-01-02 DIAGNOSIS — H2511 Age-related nuclear cataract, right eye: Secondary | ICD-10-CM | POA: Diagnosis not present

## 2023-01-15 ENCOUNTER — Other Ambulatory Visit: Payer: Self-pay | Admitting: Internal Medicine

## 2023-01-16 ENCOUNTER — Other Ambulatory Visit: Payer: Self-pay

## 2023-01-16 MED ORDER — AMIODARONE HCL 200 MG PO TABS: 200.0000 mg | ORAL_TABLET | Freq: Every day | ORAL | 0 refills | Status: DC

## 2023-01-16 NOTE — Telephone Encounter (Signed)
Requested Prescriptions   Signed Prescriptions Disp Refills   amiodarone (PACERONE) 200 MG tablet 90 tablet 0    Sig: Take 1 tablet (200 mg total) by mouth daily.    Authorizing Provider: Debbe Odea    Ordering User: Guerry Minors

## 2023-01-16 NOTE — Telephone Encounter (Signed)
Component Ref Range & Units 2 mo ago (10/31/22) 4 mo ago (08/29/22) 9 mo ago (04/11/22) 10 mo ago (02/27/22) 1 yr ago (08/26/21) 1 yr ago (04/06/21) 1 yr ago (02/04/21)  Potassium 3.5 - 5.1 mEq/L 3.9 2.9 Low  R 3.6 3.7 R 4.1 R 3.3 Low  3.6 R

## 2023-01-17 ENCOUNTER — Ambulatory Visit: Payer: Medicare Other | Attending: Cardiology | Admitting: Cardiology

## 2023-01-17 ENCOUNTER — Encounter: Payer: Self-pay | Admitting: Cardiology

## 2023-01-17 ENCOUNTER — Telehealth: Payer: Self-pay | Admitting: Cardiology

## 2023-01-17 VITALS — BP 154/76 | HR 57 | Ht 63.0 in | Wt 121.0 lb

## 2023-01-17 DIAGNOSIS — D6869 Other thrombophilia: Secondary | ICD-10-CM

## 2023-01-17 DIAGNOSIS — R410 Disorientation, unspecified: Secondary | ICD-10-CM

## 2023-01-17 DIAGNOSIS — R61 Generalized hyperhidrosis: Secondary | ICD-10-CM | POA: Diagnosis not present

## 2023-01-17 DIAGNOSIS — I4819 Other persistent atrial fibrillation: Secondary | ICD-10-CM | POA: Diagnosis not present

## 2023-01-17 DIAGNOSIS — I480 Paroxysmal atrial fibrillation: Secondary | ICD-10-CM | POA: Diagnosis not present

## 2023-01-17 MED ORDER — AMIODARONE HCL 200 MG PO TABS: 200.0000 mg | ORAL_TABLET | Freq: Every day | ORAL | 0 refills | Status: DC

## 2023-01-17 NOTE — Telephone Encounter (Signed)
Patient c/o Palpitations:  High priority if patient c/o lightheadedness, shortness of breath, or chest pain  How long have you had palpitations/irregular HR/ Afib? Are you having the symptoms now? Been in Afib for 2 days , she feels week, can't remember things, had a "spell" a couple of days ago, feels as if she's going to pass out.  Are you currently experiencing lightheadedness, SOB or CP? SOB, CP and feeling very fatigue.   Do you have a history of afib (atrial fibrillation) or irregular heart rhythm? Yes  Have you checked your BP or HR? (document readings if available): Yes but doesn't remember numbers  Are you experiencing any other symptoms? Nothing besides what's already been listed.

## 2023-01-17 NOTE — Progress Notes (Signed)
Cardiology Office Note Date:  01/17/2023  Patient ID:  Whitney Molina, Whitney Molina July 15, 1940, MRN [id number] PCP:  Sherlene Shams, MD  Cardiologist:  Debbe Odea, MD Electrophysiologist: Lanier Prude, MD    Chief Complaint: afib recurrence  History of Present Illness: Whitney Molina is a 82 y.o. female with PMH notable for persis Afib, HTN, follicular lymphoma, thrombophilia, CKD 3b, COPD; seen today for Lanier Prude, MD for acute visit due to afib.   She saw Dr. Lalla Brothers 02/2021, was maintaining NSR on amiodarone. Not ablation candidate. She has followed up regularly with gen cards, most recently [facility] Furth 11/2022. AST, ALT slightly elevated at 38 and 49, respectively. Maintaining sinus on 200mg  amiodarone daily.   She called clinic earlier today with c/o weakness and confusion, similar symptoms to when she was in afib in the past. Two days ago she was bradycardic, diaphoretic and confused. She was at work at the time, doing book-keeping. She called her husband, who helped to take her vital signs. He notes that her pulse was in the 40s. She did not have chest pain, chest pressure. No syncope. The episode lasted a few hours. Prior to this episode and the day prior, she felt very well. She does not think she has had any afib episodes in several years, and has done well from a heart perspective until two days ago.  She and her husband bring up that their son and his family have recently moved in with them and this has been stressful. They question whether this has caused her recent episode.   She diligently takes eliquis BID, no missed doses, no bleeding concerns.   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   removed at [facility]    Past Surgical History:  Procedure Laterality Date   ABDOMINAL HYSTERECTOMY  1984   BLADDER SUSPENSION  2010   CARDIOVERSION N/A 11/30/2020   Procedure: CARDIOVERSION;  Surgeon: Debbe Odea, MD;  Location: [facility] ORS;  Service: Cardiovascular;  Laterality: N/A;   CHOLECYSTECTOMY  06-01-03   COLONOSCOPY  2003   EXTRACORPOREAL SHOCK WAVE LITHOTRIPSY Left 06/16/2021   Procedure: EXTRACORPOREAL SHOCK WAVE LITHOTRIPSY (ESWL);  Surgeon: Vanna Scotland, MD;  Location: [facility] ORS;  Service: Urology;  Laterality: Left;   LITHOTRIPSY  2005   local exicsion skin cancer  2004   squamous cell of chest wall. left chest   OOPHORECTOMY     TONSILLECTOMY  1971   TUBAL LIGATION  1975   VAGINAL PROLAPSE REPAIR  July 2001   Pelvic Prolapse    Current Outpatient Medications  Medication Instructions   albuterol (VENTOLIN HFA) 108 (90 Base) MCG/ACT inhaler INHALE 1 PUFFS INTO THE LUNGS EVERY 6 HOURS AS NEEDED FOR WHEEZING OR SHORTNESS OF BREATH   amiodarone (PACERONE) 200  mg, Oral, Daily   amLODipine (NORVASC) 5 mg, Oral, Daily   apixaban (ELIQUIS) 2.5 mg, Oral, 2 times daily   Apoaequorin (PREVAGEN PO) Oral   cetirizine (ZYRTEC) 10 mg, Oral, Daily   Co-Enzyme Q10 200 MG CAPS 1 tablet, Oral, Taking 1 every other day    losartan (COZAAR) 100 mg, Oral, Daily   pantoprazole (PROTONIX) 40 mg, Oral, Daily   potassium chloride SA (KLOR-CON M) 20 MEQ tablet TAKE 1 TABLET BY MOUTH TWICE DAILY   rosuvastatin (CRESTOR) 20 mg, Oral, Daily at bedtime   Vitamin D3 1,000 Units, Oral, Daily    Social History:  The patient  reports that she has never smoked. She has  never used smokeless tobacco. She reports that she does not drink alcohol and does not use drugs.   Family History:  The patient's family history includes Bipolar disorder in her son; Breast cancer (age of onset: 88) in her paternal aunt; Breast cancer (age of onset: 62) in her mother; Cancer in her brother; Dementia in her father; Heart attack in her brother; Stroke in her brother, father, and mother.  ROS:  Please see the history of present illness. All other systems are reviewed and otherwise negative.   PHYSICAL EXAM:  VS:  BP (!) 154/76 (BP Location: Left Arm, Patient Position: Sitting, Cuff Size: Normal)   Pulse (!) 57   Ht 5\' 3"  (1.6 m)   Wt 121 lb (54.9 kg)   SpO2 98%   BMI 21.43 kg/m  BMI: Body mass index is 21.43 kg/m.  Orthostatic VS for the past 24 hrs (Last 3 readings):  BP- Lying Pulse- Lying BP- Sitting Pulse- Sitting BP- Standing at 0 minutes Pulse- Standing at 0 minutes BP- Standing at 3 minutes Pulse- Standing at 3 minutes  01/17/23 1339 174/81 56 173/70 58 169/74 74 173/79 59     GEN- The patient is well appearing, alert and oriented x 3 today.   Lungs- Clear to ausculation bilaterally, normal work of breathing.  Heart- Regular rate and rhythm, no murmurs, rubs or gallops Extremities- No peripheral edema, warm, dry   EKG is ordered. Personal review of EKG from today shows:    EKG Interpretation Date/Time:  Wednesday January 17 2023 13:08:53 EDT Ventricular Rate:  57 PR Interval:  234 QRS Duration:  84 QT Interval:  460 QTC Calculation: 447 R Axis:   -32  Text Interpretation: Sinus bradycardia with 1st degree A-V block Left axis deviation Confirmed by Sherie Don [phone]) on 01/17/2023 1:36:04 PM     SB w 1st deg HV, rate 59  Recent Labs: 08/29/2022: Hemoglobin 11.4; Platelets 190 10/31/2022: ALT 49; BUN 19; Creatinine, Ser 1.12; Magnesium 1.9; Potassium 3.9; Sodium 138; TSH 0.48  10/31/2022: Cholesterol 129; Direct LDL 53.0; HDL 53.00; LDL Cholesterol  60; Total CHOL/HDL Ratio 2; Triglycerides 80.0; VLDL 16.0   CrCl cannot be calculated (Patient's most recent lab result is older than the maximum 21 days allowed.).   Wt Readings from Last 3 Encounters:  01/17/23 121 lb (54.9 kg)  11/28/22 118 lb 6.4 oz (53.7 kg)  11/15/22 117 lb 4 oz (53.2 kg)     Additional studies reviewed include: Previous EP, cardiology notes.   TTE, 08/02/2020  1. Left ventricular ejection fraction, by estimation, is 60 to 65%. The left ventricle has normal function. The left ventricle has no regional wall motion abnormalities. Left ventricular diastolic parameters are consistent with Grade II diastolic dysfunction (pseudonormalization).   2. Right ventricular systolic function is normal.  The right ventricular size is normal. There is normal pulmonary artery systolic pressure. The estimated right ventricular systolic pressure is 29.6 mmHg.   3. The mitral valve is normal in structure. Moderate mitral valve regurgitation. No evidence of mitral stenosis.   4. Tricuspid valve regurgitation is mild to moderate.   5. Aortic valve regurgitation is mild.   6. Rhythm is normal sinus    ASSESSMENT AND PLAN:  #) persis Afib Maintaining sinus rhythm on 200mg  amiodarone daily Slightly bradycardic, but stable from previous EKGs Update amio labs today  #) Hypercoag d/t persis afib CHA2DS2-VASc Score = 4 [CHF History: 0, HTN History: 1, Diabetes History: 0, Stroke History: 0, Vascular Disease History: 0, Age Score: 2, Gender Score: 1].  Therefore, the patient's annual risk of stroke is 4.8 %.    Stroke ppx - 2.5mg  eliquis BID, dose appropriately reduced  No bleeding concerns Update CBC today with patient's dizziness  #) Weakness, confusion, diaphoresis Unknown etiology at this time Vitals signs, EKG all stable Negative orthostatics in clinic Recommended patient discuss non-cardiace causes with PCP If episodes persist, would recommend zio and updated echo       Current medicines are reviewed at length with the patient today.   The patient does not have concerns regarding her medicines.  The following changes were made today:  none  Labs/ tests ordered today include:  Orders Placed This Encounter  Procedures   CBC   Comprehensive metabolic panel   TSH   T4, free   EKG 12-Lead     Disposition: Follow up PRN with EP, continued follow-up with general cardiology as scheduled   Signed, Sherie Don, NP  01/17/23  1:36 PM  Electrophysiology [facility]

## 2023-01-17 NOTE — Telephone Encounter (Signed)
Patient states that 2 days ago she felt like she was going to pass out, became diaphoretic and confused. Patient state that her heart rate was low but does not recall how low it is. Patient reports that she still feels some weakness and confusion. Patient states that this is how she felt previously when she had atrial fibrillation. Patient says that she does not have a current blood pressure. Patient reports that her recent oxygen level was 96% and heart rate of 50. Patient requesting to be seen. Patient scheduled with Guy Begin, NP today.

## 2023-01-17 NOTE — Patient Instructions (Signed)
Medication Instructions:  The current medical regimen is effective;  continue present plan and medications.  *If you need a refill on your cardiac medications before your next appointment, please call your pharmacy*   Lab Work: Your provider would like for you to have following labs drawn today CBC, CMET, TSH, Free T4.   If you have labs (blood work) drawn today and your tests are completely normal, you will receive your results only by: MyChart Message (if you have MyChart) OR A paper copy in the mail If you have any lab test that is abnormal or we need to change your treatment, we will call you to review the results.   Follow-Up: At [facility], you and your health needs are our priority.  As part of our continuing mission to provide you with exceptional heart care, we have created designated Provider Care Teams.  These Care Teams include your primary Cardiologist (physician) and Advanced Practice Providers (APPs -  Physician Assistants and Nurse Practitioners) who all work together to provide you with the care you need, when you need it.  We recommend signing up for the patient portal called "MyChart".  Sign up information is provided on this After Visit Summary.  MyChart is used to connect with patients for Virtual Visits (Telemedicine).  Patients are able to view lab/test results, encounter notes, upcoming appointments, etc.  Non-urgent messages can be sent to your provider as well.   To learn more about what you can do with MyChart, go to [email or web address].    Your next appointment:   As needed  Provider:   Sherie Don, NP   Will continue to see Primary Cardiology

## 2023-01-18 LAB — COMPREHENSIVE METABOLIC PANEL
ALT: 63 IU/L — ABNORMAL HIGH (ref 0–32)
AST: 53 IU/L — ABNORMAL HIGH (ref 0–40)
Albumin: 4.6 g/dL (ref 3.7–4.7)
Alkaline Phosphatase: 95 IU/L (ref 44–121)
BUN/Creatinine Ratio: 18 (ref 12–28)
BUN: 19 mg/dL (ref 8–27)
Bilirubin Total: 0.4 mg/dL (ref 0.0–1.2)
CO2: 23 mmol/L (ref 20–29)
Calcium: 9.6 mg/dL (ref 8.7–10.3)
Chloride: 103 mmol/L (ref 96–106)
Creatinine, Ser: 1.08 mg/dL — ABNORMAL HIGH (ref 0.57–1.00)
Globulin, Total: 2.4 g/dL (ref 1.5–4.5)
Glucose: 95 mg/dL (ref 70–99)
Potassium: 3.9 mmol/L (ref 3.5–5.2)
Sodium: 142 mmol/L (ref 134–144)
Total Protein: 7 g/dL (ref 6.0–8.5)
eGFR: 52 mL/min/{1.73_m2} — ABNORMAL LOW (ref >59)

## 2023-01-18 LAB — T4, FREE: Free T4: 1.37 ng/dL (ref 0.82–1.77)

## 2023-01-18 LAB — CBC
Hematocrit: 37.9 % (ref 34.0–46.6)
Hemoglobin: 12.4 g/dL (ref 11.1–15.9)
MCH: 31.4 pg (ref 26.6–33.0)
MCHC: 32.7 g/dL (ref 31.5–35.7)
MCV: 96 fL (ref 79–97)
Platelets: 202 10*3/uL (ref 150–450)
RBC: 3.95 x10E6/uL (ref 3.77–5.28)
RDW: 13.4 % (ref 11.7–15.4)
WBC: 6.3 10*3/uL (ref 3.4–10.8)

## 2023-01-18 LAB — TSH: TSH: 0.788 u[IU]/mL (ref 0.450–4.500)

## 2023-01-23 ENCOUNTER — Encounter: Payer: Self-pay | Admitting: Ophthalmology

## 2023-01-23 NOTE — Anesthesia Preprocedure Evaluation (Incomplete)
Anesthesia Evaluation    Airway        Dental   Pulmonary           Cardiovascular hypertension,   08-02-20   1. Left ventricular ejection fraction, by estimation, is 60 to 65%. The  left ventricle has normal function. The left ventricle has no regional  wall motion abnormalities. Left ventricular diastolic parameters are  consistent with Grade II diastolic  dysfunction (pseudonormalization).   2. Right ventricular systolic function is normal. The right ventricular  size is normal. There is normal pulmonary artery systolic pressure. The  estimated right ventricular systolic pressure is 29.6 mmHg.   3. The mitral valve is normal in structure. Moderate mitral valve  regurgitation. No evidence of mitral stenosis.   4. Tricuspid valve regurgitation is mild to moderate.   5. Aortic valve regurgitation is mild.   6. Rhythm is normal sinus     Neuro/Psych    GI/Hepatic   Endo/Other    Renal/GU      Musculoskeletal   Abdominal   Peds  Hematology   Anesthesia Other Findings Bronchitis  Hypertension Hyperlipidemia  Hemorrhoids Heart murmur  GERD (gastroesophageal reflux disease) History of kidney stones  History of colon polyps History of mammogram  History of dysplastic nevus History of squamous cell carcinoma  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) Basal cell carcinoma of nose  Non Hodgkin's lymphoma (HCC) History of basal cell carcinoma  Atrial fibrillation [facility]    Reproductive/Obstetrics                              Anesthesia Physical Anesthesia Plan Anesthesia Quick Evaluation

## 2023-01-23 NOTE — Anesthesia Preprocedure Evaluation (Addendum)
Anesthesia Evaluation  Patient identified by MRN, date of birth, ID band Patient awake    Reviewed: Allergy & Precautions, H&P , NPO status , Patient's Chart, lab work & pertinent test results  Airway Mallampati: III  TM Distance: >3 FB Neck ROM: Full    Dental no notable dental hx.    Pulmonary COPD   Pulmonary exam normal breath sounds clear to auscultation       Cardiovascular hypertension, Normal cardiovascular exam+ Valvular Problems/Murmurs MR  Rhythm:Regular Rate:Normal  08-02-20 1. Left ventricular ejection fraction, by estimation, is 60 to 65%. The  left ventricle has normal function. The left ventricle has no regional  wall motion abnormalities. Left ventricular diastolic parameters are  consistent with Grade II diastolic  dysfunction (pseudonormalization).   2. Right ventricular systolic function is normal. The right ventricular  size is normal. There is normal pulmonary artery systolic pressure. The  estimated right ventricular systolic pressure is 29.6 mmHg.   3. The mitral valve is normal in structure. Moderate mitral valve  regurgitation. No evidence of mitral stenosis.   4. Tricuspid valve regurgitation is mild to moderate.   5. Aortic valve regurgitation is mild.   6. Rhythm is normal sinus   01-17-23  Text Interpretation:Sinus bradycardia with 1st degree A-V block Left axis deviation     Neuro/Psych  PSYCHIATRIC DISORDERS Anxiety    Dementia negative neurological ROS  negative psych ROS   GI/Hepatic Neg liver ROS,GERD  ,,  Endo/Other  negative endocrine ROS    Renal/GU Renal disease  negative genitourinary   Musculoskeletal  (+) Arthritis ,    Abdominal   Peds negative pediatric ROS (+)  Hematology negative hematology ROS (+)   Anesthesia Other Findings Bronchitis  Hypertension Hyperlipidemia  Hemorrhoids Heart murmur GERD (gastroesophageal reflux disease) History of kidney  stones  History of colon polyps  History of dysplastic nevus History of squamous cell carcinoma  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) Basal cell carcinoma of nose Non Hodgkin's lymphoma (HCC) History of basal cell carcinoma  Atrial fibrillation (HCC) Grade II diastolic dysfunction  Moderate mitral regurgitation by prior echocardiogram Mild aortic regurgitation  Mild to moderate tricuspid regurgitation by prior echocardiogram    Reproductive/Obstetrics negative OB ROS                             Anesthesia Physical Anesthesia Plan  ASA: 3  Anesthesia Plan: MAC   Post-op Pain Management:    Induction: Intravenous  PONV Risk Score and Plan:   Airway Management Planned: Natural Airway and Nasal Cannula  Additional Equipment:   Intra-op Plan:   Post-operative Plan:   Informed Consent: I have reviewed the patients History and Physical, chart, labs and discussed the procedure including the risks, benefits and alternatives for the proposed anesthesia with the patient or authorized representative who has indicated his/her understanding and acceptance.     Dental Advisory Given  Plan Discussed with: Anesthesiologist, CRNA and Surgeon  Anesthesia Plan Comments: (Patient consented for risks of anesthesia including but not limited to:  - adverse reactions to medications - damage to eyes, teeth, lips or other oral mucosa - nerve damage due to positioning  - sore throat or hoarseness - Damage to heart, brain, nerves, lungs, other parts of body or loss of life  Patient voiced understanding.)       Anesthesia Quick Evaluation

## 2023-01-24 ENCOUNTER — Other Ambulatory Visit: Payer: Self-pay | Admitting: Internal Medicine

## 2023-01-24 NOTE — Telephone Encounter (Signed)
Component Ref Range & Units 7 d ago (01/17/23) 2 mo ago (10/31/22) 4 mo ago (08/29/22) 9 mo ago (04/11/22) 11 mo ago (02/27/22) 1 yr ago (08/26/21) 1 yr ago (04/06/21)  Potassium 3.5 - 5.2 mmol/L 3.9 3.9 R 2.9 Low  R 3.6 R 3.7 R 4.1 R 3.3 Low  R

## 2023-01-25 NOTE — Discharge Instructions (Signed)

## 2023-01-29 ENCOUNTER — Ambulatory Visit: Payer: Medicare Other | Admitting: Anesthesiology

## 2023-01-29 ENCOUNTER — Other Ambulatory Visit: Payer: Self-pay

## 2023-01-29 ENCOUNTER — Encounter
Admission: RE | Disposition: A | Discharge status: Home / Self Care | Payer: Self-pay | Source: Home / Self Care | Attending: Ophthalmology

## 2023-01-29 ENCOUNTER — Ambulatory Visit
Admission: RE | Admit: 2023-01-29 | Discharge: 2023-01-29 | Disposition: A | Discharge status: Home / Self Care | Payer: Medicare Other | Attending: Ophthalmology | Admitting: Ophthalmology

## 2023-01-29 ENCOUNTER — Encounter: Payer: Self-pay | Admitting: Ophthalmology

## 2023-01-29 DIAGNOSIS — F03A0 Unspecified dementia, mild, without behavioral disturbance, psychotic disturbance, mood disturbance, and anxiety: Secondary | ICD-10-CM | POA: Insufficient documentation

## 2023-01-29 DIAGNOSIS — Z8572 Personal history of non-Hodgkin lymphomas: Secondary | ICD-10-CM | POA: Diagnosis not present

## 2023-01-29 DIAGNOSIS — J449 Chronic obstructive pulmonary disease, unspecified: Secondary | ICD-10-CM | POA: Diagnosis not present

## 2023-01-29 DIAGNOSIS — K219 Gastro-esophageal reflux disease without esophagitis: Secondary | ICD-10-CM | POA: Diagnosis not present

## 2023-01-29 DIAGNOSIS — H269 Unspecified cataract: Secondary | ICD-10-CM | POA: Diagnosis not present

## 2023-01-29 DIAGNOSIS — I4891 Unspecified atrial fibrillation: Secondary | ICD-10-CM | POA: Diagnosis not present

## 2023-01-29 DIAGNOSIS — Z7901 Long term (current) use of anticoagulants: Secondary | ICD-10-CM | POA: Diagnosis not present

## 2023-01-29 DIAGNOSIS — H2511 Age-related nuclear cataract, right eye: Secondary | ICD-10-CM | POA: Insufficient documentation

## 2023-01-29 DIAGNOSIS — I082 Rheumatic disorders of both aortic and tricuspid valves: Secondary | ICD-10-CM | POA: Diagnosis not present

## 2023-01-29 DIAGNOSIS — E785 Hyperlipidemia, unspecified: Secondary | ICD-10-CM | POA: Insufficient documentation

## 2023-01-29 DIAGNOSIS — I10 Essential (primary) hypertension: Secondary | ICD-10-CM | POA: Insufficient documentation

## 2023-01-29 DIAGNOSIS — I480 Paroxysmal atrial fibrillation: Secondary | ICD-10-CM | POA: Diagnosis not present

## 2023-01-29 HISTORY — DX: Nonrheumatic mitral (valve) insufficiency: I34.0

## 2023-01-29 HISTORY — DX: Mild cognitive impairment of uncertain or unknown etiology: G31.84

## 2023-01-29 HISTORY — DX: Nonrheumatic aortic (valve) insufficiency: I35.1

## 2023-01-29 HISTORY — DX: Other ill-defined heart diseases: I51.89

## 2023-01-29 HISTORY — DX: Follicular lymphoma, unspecified, unspecified site: C82.90

## 2023-01-29 HISTORY — DX: Unspecified dementia, mild, with anxiety: F03.A4

## 2023-01-29 HISTORY — DX: Unspecified atrial fibrillation: I48.91

## 2023-01-29 HISTORY — DX: Rheumatic tricuspid insufficiency: I07.1

## 2023-01-29 HISTORY — PX: CATARACT EXTRACTION W/PHACO: SHX586

## 2023-01-29 SURGERY — PHACOEMULSIFICATION, CATARACT, WITH IOL INSERTION
Anesthesia: Monitor Anesthesia Care | Laterality: Right

## 2023-01-29 MED ORDER — LACTATED RINGERS IV SOLN: INTRAVENOUS | Status: DC

## 2023-01-29 MED ORDER — ARMC OPHTHALMIC DILATING DROPS
1.0000 | OPHTHALMIC | Status: DC | PRN
  Administered 2023-01-29 (×3): 1 via OPHTHALMIC

## 2023-01-29 MED ORDER — SIGHTPATH DOSE#1 BSS IO SOLN
INTRAOCULAR | Status: DC | PRN
  Administered 2023-01-29: 15 mL via INTRAOCULAR

## 2023-01-29 MED ORDER — MIDAZOLAM HCL 2 MG/2ML IJ SOLN
INTRAMUSCULAR | Status: DC | PRN
  Administered 2023-01-29: 1 mg via INTRAVENOUS

## 2023-01-29 MED ORDER — SIGHTPATH DOSE#1 BSS IO SOLN
INTRAOCULAR | Status: DC | PRN
  Administered 2023-01-29: 89 mL via OPHTHALMIC

## 2023-01-29 MED ORDER — MOXIFLOXACIN HCL 0.5 % OP SOLN
OPHTHALMIC | Status: DC | PRN
  Administered 2023-01-29: .2 mL via OPHTHALMIC

## 2023-01-29 MED ORDER — FENTANYL CITRATE (PF) 100 MCG/2ML IJ SOLN
INTRAMUSCULAR | Status: DC | PRN
  Administered 2023-01-29: 25 ug via INTRAVENOUS

## 2023-01-29 MED ORDER — LIDOCAINE HCL (PF) 2 % IJ SOLN
INTRAOCULAR | Status: DC | PRN
  Administered 2023-01-29: 1 mL via INTRAOCULAR

## 2023-01-29 MED ORDER — TETRACAINE HCL 0.5 % OP SOLN
1.0000 [drp] | OPHTHALMIC | Status: DC | PRN
  Administered 2023-01-29 (×3): 1 [drp] via OPHTHALMIC

## 2023-01-29 MED ORDER — SIGHTPATH DOSE#1 NA HYALUR & NA CHOND-NA HYALUR IO KIT
PACK | INTRAOCULAR | Status: DC | PRN
  Administered 2023-01-29: 1 via OPHTHALMIC

## 2023-01-29 MED ORDER — SODIUM CHLORIDE 0.9% FLUSH
INTRAVENOUS | Status: DC | PRN
  Administered 2023-01-29: 10 mL via INTRAVENOUS

## 2023-01-29 SURGICAL SUPPLY — 11 items
CATARACT SUITE SIGHTPATH (MISCELLANEOUS) ×1
DISSECTOR HYDRO NUCLEUS 50X22 (MISCELLANEOUS) ×1 IMPLANT
FEE CATARACT SUITE SIGHTPATH (MISCELLANEOUS) ×1 IMPLANT
GLOVE SURG GAMMEX PI TX LF 7.5 (GLOVE) ×1 IMPLANT
GLOVE SURG SYN 8.5 E (GLOVE) ×1
GLOVE SURG SYN 8.5 PF PI (GLOVE) ×1 IMPLANT
LENS IOL TECNIS EYHANCE 22.5 (Intraocular Lens) IMPLANT
NDL FILTER BLUNT 18X1 1/2 (NEEDLE) ×1 IMPLANT
NEEDLE FILTER BLUNT 18X1 1/2 (NEEDLE) ×1
SYR 3ML LL SCALE MARK (SYRINGE) ×1 IMPLANT
SYR 5ML LL (SYRINGE) ×1 IMPLANT

## 2023-01-29 NOTE — H&P (Signed)
[facility]   Primary Care Physician:  Sherlene Shams, MD Ophthalmologist: Dr. Willey Blade  Pre-Procedure History & Physical: HPI:  Whitney Molina is a 82 y.o. female here for cataract surgery.   Past Medical History:  Diagnosis Date   Atrial fibrillation (HCC)    Basal cell carcinoma of nose    Bronchitis    Follicular lymphoma (HCC)    GERD (gastroesophageal reflux disease)    Grade II diastolic dysfunction    Heart murmur    Hemorrhoids    History of basal cell carcinoma    right upper lip, right ant shoulder   History of colon polyps    History of dysplastic nevus 12/05/2001   left paraspinal upper back, upper back spinal   History of kidney stones    History of mammogram 2016   History of squamous cell carcinoma 02/07/2005   left ant chest   Hyperlipidemia    Hypertension    Mild aortic regurgitation    Mild cognitive impairment    Mild dementia with anxiety (HCC)    Mild tricuspid regurgitation by prior echocardiogram    Moderate mitral regurgitation by prior echocardiogram    Non Hodgkin's lymphoma (HCC)    Non Hodgkin's lymphoma (HCC)    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   removed at [facility]    Past Surgical History:  Procedure Laterality Date   ABDOMINAL HYSTERECTOMY  1984   BLADDER SUSPENSION  2010   CARDIOVERSION N/A 11/30/2020   Procedure: CARDIOVERSION;  Surgeon: Debbe Odea, MD;  Location: [facility] ORS;  Service: Cardiovascular;  Laterality: N/A;   CHOLECYSTECTOMY  06-01-03   COLONOSCOPY  2003   EXTRACORPOREAL SHOCK WAVE LITHOTRIPSY Left 06/16/2021   Procedure: EXTRACORPOREAL SHOCK WAVE LITHOTRIPSY (ESWL);  Surgeon: Vanna Scotland, MD;  Location: [facility] ORS;  Service: Urology;  Laterality: Left;   LITHOTRIPSY  2005   local exicsion skin cancer  2004   squamous cell of chest wall. left chest   OOPHORECTOMY     TONSILLECTOMY  1971   TUBAL LIGATION  1975   VAGINAL PROLAPSE REPAIR  July 2001   Pelvic Prolapse     Prior to Admission medications   Medication Sig Start Date End Date Taking? Authorizing Provider  amiodarone (PACERONE) 200 MG tablet Take 1 tablet (200 mg total) by mouth daily. 01/17/23  Yes Agbor-Etang, Arlys John, MD  amLODipine (NORVASC) 5 MG tablet Take 1 tablet (5 mg total) by mouth daily. 10/31/22  Yes Sherlene Shams, MD  apixaban (ELIQUIS) 2.5 MG TABS tablet Take 1 tablet (2.5 mg total) by mouth 2 (two) times daily. 08/16/22  Yes Agbor-Etang, Arlys John, MD  Apoaequorin (PREVAGEN PO) Take by mouth.   Yes [provider]  Cholecalciferol (VITAMIN D3) 1000 units CAPS Take 1,000 Units by mouth daily.   Yes [provider]  Co-Enzyme Q10 200 MG CAPS Take 1 tablet by mouth. Taking 1 every other day   Yes [provider]  losartan (COZAAR) 100 MG tablet Take 1 tablet (100 mg total) by mouth daily. 10/31/22  Yes Sherlene Shams, MD  pantoprazole (PROTONIX) 40 MG tablet Take 40 mg by mouth daily.   Yes [provider]  potassium chloride SA (KLOR-CON M) 20 MEQ tablet TAKE 1 TABLET BY MOUTH TWICE DAILY 01/24/23  Yes Earna Coder, MD  rosuvastatin (CRESTOR) 20 MG tablet Take 1 tablet (20 mg total) by mouth at bedtime. 07/25/22  Yes Sherlene Shams, MD  albuterol (VENTOLIN HFA)  108 (90 Base) MCG/ACT inhaler INHALE 1 PUFFS INTO THE LUNGS EVERY 6 HOURS AS NEEDED FOR WHEEZING OR SHORTNESS OF BREATH Patient not taking: Reported on 01/23/2023 11/24/20   Glenford Bayley, NP    Allergies as of 12/27/2022   (No Known Allergies)    Family History  Problem Relation Age of Onset   Stroke Mother    Breast cancer Mother 7   Stroke Father    Dementia Father    Cancer Brother        bladder   Stroke Brother    Heart attack Brother    Breast cancer Paternal Aunt 2   Bipolar disorder Son     Social History   Socioeconomic History   Marital status: Married    Spouse name: Not on file   Number of children: Not on file   Years of education: Not on file    Highest education level: Not on file  Occupational History   Not on file  Tobacco Use   Smoking status: Never   Smokeless tobacco: Never  Vaping Use   Vaping status: Never Used  Substance and Sexual Activity   Alcohol use: No   Drug use: No   Sexual activity: Yes  Other Topics Concern   Not on file  Social History Narrative   Not on file   Social Determinants of Health   Financial Resource Strain: Low Risk  (09/15/2020)   Overall Financial Resource Strain (CARDIA)    Difficulty of Paying Living Expenses: Not hard at all  Food Insecurity: No Food Insecurity (09/15/2020)   Hunger Vital Sign    Worried About Running Out of Food in the Last Year: Never true    Ran Out of Food in the Last Year: Never true  Transportation Needs: No Transportation Needs (09/15/2020)   PRAPARE - Administrator, Civil Service (Medical): No    Lack of Transportation (Non-Medical): No  Physical Activity: Insufficiently Active (09/15/2020)   Exercise Vital Sign    Days of Exercise per Week: 4 days    Minutes of Exercise per Session: 20 min  Stress: No Stress Concern Present (09/15/2020)   [employer] of Occupational Health - Occupational Stress Questionnaire    Feeling of Stress : Not at all  Social Connections: Unknown (09/15/2020)   Social Connection and Isolation Panel [NHANES]    Frequency of Communication with Friends and Family: Not on file    Frequency of Social Gatherings with Friends and Family: Not on file    Attends Religious Services: Not on file    Active Member of Clubs or Organizations: Not on file    Attends Banker Meetings: Not on file    Marital Status: Married  Intimate Partner Violence: Not At Risk (09/15/2020)   Humiliation, Afraid, Rape, and Kick questionnaire    Fear of Current or Ex-Partner: No    Emotionally Abused: No    Physically Abused: No    Sexually Abused: No    Review of Systems: See HPI, otherwise negative ROS  Physical Exam: BP (!)  164/68   Temp 98.1 F (36.7 C) (Temporal)   Resp 16   Ht 5\' 3"  (1.6 m)   Wt 52.4 kg   SpO2 98%   BMI 20.48 kg/m  General:   Alert, cooperative in NAD Head:  Normocephalic and atraumatic. Respiratory:  Normal work of breathing. Cardiovascular:  RRR  Impression/Plan: Whitney Molina is here for cataract surgery.  Risks, benefits, limitations, and  alternatives regarding cataract surgery have been reviewed with the patient.  Questions have been answered.  All parties agreeable.   Willey Blade, MD  01/29/2023, 8:25 AM

## 2023-01-29 NOTE — Op Note (Signed)
OPERATIVE NOTE  Whitney Molina [id number] 01/29/2023   PREOPERATIVE DIAGNOSIS:  Nuclear sclerotic cataract right eye.  H25.11   POSTOPERATIVE DIAGNOSIS:    Nuclear sclerotic cataract right eye.     PROCEDURE:  Phacoemusification with posterior chamber intraocular lens placement of the right eye   LENS:   Implant Name Type Inv. Item Serial No. Manufacturer Lot No. LRB No. Used Action  LENS IOL TECNIS EYHANCE 22.5 - [id number] Intraocular Lens LENS IOL TECNIS EYHANCE 22.5  [employer]  Right 1 Implanted       Procedure(s): CATARACT EXTRACTION PHACO AND INTRAOCULAR LENS PLACEMENT (IOC) RIGHT 2.54 00:22.1 (Right)   SURGEON:  Willey Blade, MD, MPH  ANESTHESIOLOGIST: Anesthesiologist: Marisue Humble, MD CRNA: Genia Del, CRNA   ANESTHESIA:  Topical with tetracaine drops augmented with 1% preservative-free intracameral lidocaine.  ESTIMATED BLOOD LOSS: less than 1 mL.   COMPLICATIONS:  None.   DESCRIPTION OF PROCEDURE:  The patient was identified in the holding room and transported to the operating room and placed in the supine position under the operating microscope.  The right eye was identified as the operative eye and it was prepped and draped in the usual sterile ophthalmic fashion.   A 1.0 millimeter clear-corneal paracentesis was made at the 10:30 position. 0.5 ml of preservative-free 1% lidocaine with epinephrine was injected into the anterior chamber.  The anterior chamber was filled with viscoelastic.  A 2.4 millimeter keratome was used to make a near-clear corneal incision at the 8:00 position.  A curvilinear capsulorrhexis was made with a cystotome and capsulorrhexis forceps.  Balanced salt solution was used to hydrodissect and hydrodelineate the nucleus.   Phacoemulsification was then used in stop and chop fashion to remove the lens nucleus and epinucleus.  The remaining cortex was then removed using the irrigation and aspiration handpiece. Viscoelastic was then  placed into the capsular bag to distend it for lens placement.  A lens was then injected into the capsular bag.  The remaining viscoelastic was aspirated.   Wounds were hydrated with balanced salt solution.  The anterior chamber was inflated to a physiologic pressure with balanced salt solution.   Intracameral vigamox 0.1 mL undiluted was injected into the eye and a drop placed onto the ocular surface.  No wound leaks were noted.  The patient was taken to the recovery room in stable condition without complications of anesthesia or surgery  Willey Blade 01/29/2023, 8:56 AM

## 2023-01-29 NOTE — Anesthesia Postprocedure Evaluation (Signed)
Anesthesia Post Note  Patient: Whitney Molina  Procedure(s) Performed: CATARACT EXTRACTION PHACO AND INTRAOCULAR LENS PLACEMENT (IOC) RIGHT 2.54 00:22.1 (Right)  Patient location during evaluation: PACU Anesthesia Type: MAC Level of consciousness: awake and alert Pain management: pain level controlled Vital Signs Assessment: post-procedure vital signs reviewed and stable Respiratory status: spontaneous breathing, nonlabored ventilation, respiratory function stable and patient connected to nasal cannula oxygen Cardiovascular status: stable and blood pressure returned to baseline Postop Assessment: no apparent nausea or vomiting Anesthetic complications: no   No notable events documented.   Last Vitals:  Vitals:   01/29/23 0900 01/29/23 0903  BP: (!) 145/73 127/83  Pulse: (!) 54 (!) 54  Resp: 17 18  Temp:  36.5 C  SpO2: 97% 97%    Last Pain:  Vitals:   01/29/23 0903  TempSrc:   PainSc: 0-No pain                 Marisue Humble

## 2023-01-29 NOTE — Transfer of Care (Signed)
Immediate Anesthesia Transfer of Care Note  Patient: Whitney Molina  Procedure(s) Performed: CATARACT EXTRACTION PHACO AND INTRAOCULAR LENS PLACEMENT (IOC) RIGHT 2.54 00:22.1 (Right)  Patient Location: PACU  Anesthesia Type:MAC  Level of Consciousness: awake, alert , and oriented  Airway & Oxygen Therapy: Patient Spontanous Breathing  Post-op Assessment: Report given to RN and Post -op Vital signs reviewed and stable  Post vital signs: Reviewed and stable  Last Vitals: See PACU for normal temp Vitals Value Taken Time  BP 145/73 01/29/23 0857  Temp    Pulse 53 01/29/23 0858  Resp 16 01/29/23 0858  SpO2 98 % 01/29/23 0858  Vitals shown include unfiled device data.  Last Pain:  Vitals:   01/29/23 0743  TempSrc: Temporal  PainSc: 0-No pain         Complications: No notable events documented.

## 2023-01-30 DIAGNOSIS — H2512 Age-related nuclear cataract, left eye: Secondary | ICD-10-CM | POA: Diagnosis not present

## 2023-02-01 ENCOUNTER — Telehealth: Payer: Self-pay | Admitting: Cardiology

## 2023-02-01 NOTE — Telephone Encounter (Signed)
Patient states she is returning a call regarding medication changes. Please advise.

## 2023-02-01 NOTE — Telephone Encounter (Signed)
Left a message for the patient to call back.  

## 2023-02-02 ENCOUNTER — Other Ambulatory Visit: Payer: Self-pay | Admitting: Internal Medicine

## 2023-02-02 NOTE — Telephone Encounter (Signed)
Left a message for the patient to call back if anything further was needed.

## 2023-02-02 NOTE — Telephone Encounter (Signed)
Component Ref Range & Units 2 wk ago (01/17/23) 3 mo ago (10/31/22) 5 mo ago (08/29/22) 9 mo ago (04/11/22) 11 mo ago (02/27/22) 1 yr ago (08/26/21) 1 yr ago (04/06/21)  Potassium 3.5 - 5.2 mmol/L 3.9 3.9 R 2.9 Low  R 3.6 R 3.7 R 4.1 R 3.3 Low  R

## 2023-02-06 NOTE — Anesthesia Preprocedure Evaluation (Addendum)
Anesthesia Evaluation  Patient identified by MRN, date of birth, ID band Patient awake    Reviewed: Allergy & Precautions, H&P , NPO status , Patient's Chart, lab work & pertinent test results  Airway Mallampati: III  TM Distance: >3 FB Neck ROM: Full    Dental no notable dental hx.    Pulmonary COPD   Pulmonary exam normal breath sounds clear to auscultation       Cardiovascular hypertension, Normal cardiovascular exam+ Valvular Problems/Murmurs  Rhythm:Regular Rate:Normal  08-02-20 1. Left ventricular ejection fraction, by estimation, is 60 to 65%. The  left ventricle has normal function. The left ventricle has no regional  wall motion abnormalities. Left ventricular diastolic parameters are  consistent with Grade II diastolic  dysfunction (pseudonormalization).   2. Right ventricular systolic function is normal. The right ventricular  size is normal. There is normal pulmonary artery systolic pressure. The  estimated right ventricular systolic pressure is 29.6 mmHg.   3. The mitral valve is normal in structure. Moderate mitral valve  regurgitation. No evidence of mitral stenosis.   4. Tricuspid valve regurgitation is mild to moderate.   5. Aortic valve regurgitation is mild.   6. Rhythm is normal sinus    01-17-23  Text Interpretation:Sinus bradycardia with 1st degree A-V block Left axis deviation       Neuro/Psych  PSYCHIATRIC DISORDERS Anxiety    Dementia negative neurological ROS  negative psych ROS   GI/Hepatic Neg liver ROS,GERD  ,,  Endo/Other  negative endocrine ROS    Renal/GU Renal disease  negative genitourinary   Musculoskeletal  (+) Arthritis ,    Abdominal   Peds negative pediatric ROS (+)  Hematology negative hematology ROS (+)   Anesthesia Other Findings Bronchitis  Hypertension Hyperlipidemia  Hemorrhoids Heart murmur GERD (gastroesophageal reflux disease) History of kidney  stones  History of colon polyps History of mammogram  History of dysplastic nevus History of squamous cell carcinoma 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) Basal cell carcinoma of nose  Non Hodgkin's lymphoma (HCC) History of basal cell carcinoma  Atrial fibrillation (HCC) Grade II diastolic dysfunction  Moderate mitral regurgitation by prior echocardiogram Mild aortic regurgitation  Mild tricuspid regurgitation by prior echocardiogram Follicular lymphoma (HCC)  Non Hodgkin's lymphoma (HCC) Mild cognitive impairment  Mild dementia with anxiety (HCC  Previous cataract surgery 01-30-23   Reproductive/Obstetrics negative OB ROS                             Anesthesia Physical Anesthesia Plan  ASA: 3  Anesthesia Plan: MAC   Post-op Pain Management:    Induction: Intravenous  PONV Risk Score and Plan:   Airway Management Planned: Natural Airway and Nasal Cannula  Additional Equipment:   Intra-op Plan:   Post-operative Plan:   Informed Consent: I have reviewed the patients History and Physical, chart, labs and discussed the procedure including the risks, benefits and alternatives for the proposed anesthesia with the patient or authorized representative who has indicated his/her understanding and acceptance.     Dental Advisory Given  Plan Discussed with: Anesthesiologist, CRNA and Surgeon  Anesthesia Plan Comments: (Patient consented for risks of anesthesia including but not limited to:  - adverse reactions to medications - damage to eyes, teeth, lips or other oral mucosa - nerve damage due to positioning  - sore throat or hoarseness - Damage to heart, brain, nerves, lungs, other parts of body or loss of life  Patient  voiced understanding.)        Anesthesia Quick Evaluation

## 2023-02-07 NOTE — Discharge Instructions (Signed)

## 2023-02-12 ENCOUNTER — Other Ambulatory Visit: Payer: Self-pay

## 2023-02-12 ENCOUNTER — Encounter: Payer: Self-pay | Admitting: Ophthalmology

## 2023-02-12 ENCOUNTER — Ambulatory Visit
Admission: RE | Admit: 2023-02-12 | Discharge: 2023-02-12 | Disposition: A | Discharge status: Home / Self Care | Payer: Medicare Other | Attending: Ophthalmology | Admitting: Ophthalmology

## 2023-02-12 ENCOUNTER — Encounter
Admission: RE | Disposition: A | Discharge status: Home / Self Care | Payer: Self-pay | Source: Home / Self Care | Attending: Ophthalmology

## 2023-02-12 ENCOUNTER — Ambulatory Visit: Payer: Medicare Other | Admitting: Anesthesiology

## 2023-02-12 DIAGNOSIS — I10 Essential (primary) hypertension: Secondary | ICD-10-CM | POA: Diagnosis not present

## 2023-02-12 DIAGNOSIS — J449 Chronic obstructive pulmonary disease, unspecified: Secondary | ICD-10-CM | POA: Insufficient documentation

## 2023-02-12 DIAGNOSIS — I083 Combined rheumatic disorders of mitral, aortic and tricuspid valves: Secondary | ICD-10-CM | POA: Insufficient documentation

## 2023-02-12 DIAGNOSIS — Z7901 Long term (current) use of anticoagulants: Secondary | ICD-10-CM | POA: Insufficient documentation

## 2023-02-12 DIAGNOSIS — F03A0 Unspecified dementia, mild, without behavioral disturbance, psychotic disturbance, mood disturbance, and anxiety: Secondary | ICD-10-CM | POA: Insufficient documentation

## 2023-02-12 DIAGNOSIS — Z8572 Personal history of non-Hodgkin lymphomas: Secondary | ICD-10-CM | POA: Diagnosis not present

## 2023-02-12 DIAGNOSIS — K219 Gastro-esophageal reflux disease without esophagitis: Secondary | ICD-10-CM | POA: Diagnosis not present

## 2023-02-12 DIAGNOSIS — I4891 Unspecified atrial fibrillation: Secondary | ICD-10-CM | POA: Insufficient documentation

## 2023-02-12 DIAGNOSIS — H2512 Age-related nuclear cataract, left eye: Secondary | ICD-10-CM | POA: Insufficient documentation

## 2023-02-12 DIAGNOSIS — I480 Paroxysmal atrial fibrillation: Secondary | ICD-10-CM | POA: Diagnosis not present

## 2023-02-12 HISTORY — PX: CATARACT EXTRACTION W/PHACO: SHX586

## 2023-02-12 SURGERY — PHACOEMULSIFICATION, CATARACT, WITH IOL INSERTION
Anesthesia: Monitor Anesthesia Care | Laterality: Left

## 2023-02-12 MED ORDER — ARMC OPHTHALMIC DILATING DROPS
1.0000 | OPHTHALMIC | Status: DC | PRN
  Administered 2023-02-12 (×3): 1 via OPHTHALMIC

## 2023-02-12 MED ORDER — FENTANYL CITRATE (PF) 100 MCG/2ML IJ SOLN
INTRAMUSCULAR | Status: DC | PRN
  Administered 2023-02-12 (×2): 25 ug via INTRAVENOUS

## 2023-02-12 MED ORDER — ARMC OPHTHALMIC DILATING DROPS
OPHTHALMIC | Status: AC
  Filled 2023-02-12: qty 0.5

## 2023-02-12 MED ORDER — TETRACAINE HCL 0.5 % OP SOLN
1.0000 [drp] | OPHTHALMIC | Status: DC | PRN
  Administered 2023-02-12 (×3): 1 [drp] via OPHTHALMIC

## 2023-02-12 MED ORDER — LACTATED RINGERS IV SOLN: INTRAVENOUS | Status: DC

## 2023-02-12 MED ORDER — MIDAZOLAM HCL 2 MG/2ML IJ SOLN
INTRAMUSCULAR | Status: DC | PRN
  Administered 2023-02-12: 1 mg via INTRAVENOUS

## 2023-02-12 MED ORDER — LIDOCAINE HCL (PF) 2 % IJ SOLN
INTRAOCULAR | Status: DC | PRN
  Administered 2023-02-12: 1 mL via INTRAOCULAR

## 2023-02-12 MED ORDER — SIGHTPATH DOSE#1 BSS IO SOLN
INTRAOCULAR | Status: DC | PRN
  Administered 2023-02-12: 15 mL via INTRAOCULAR

## 2023-02-12 MED ORDER — MOXIFLOXACIN HCL 0.5 % OP SOLN
OPHTHALMIC | Status: DC | PRN
  Administered 2023-02-12: .2 mL via OPHTHALMIC

## 2023-02-12 MED ORDER — TETRACAINE HCL 0.5 % OP SOLN
OPHTHALMIC | Status: AC
  Filled 2023-02-12: qty 4

## 2023-02-12 MED ORDER — SIGHTPATH DOSE#1 NA HYALUR & NA CHOND-NA HYALUR IO KIT
PACK | INTRAOCULAR | Status: DC | PRN
  Administered 2023-02-12: 1 via OPHTHALMIC

## 2023-02-12 MED ORDER — MIDAZOLAM HCL 2 MG/2ML IJ SOLN
INTRAMUSCULAR | Status: AC
  Filled 2023-02-12: qty 2

## 2023-02-12 MED ORDER — FENTANYL CITRATE (PF) 100 MCG/2ML IJ SOLN
INTRAMUSCULAR | Status: AC
  Filled 2023-02-12: qty 2

## 2023-02-12 MED ORDER — SIGHTPATH DOSE#1 BSS IO SOLN
INTRAOCULAR | Status: DC | PRN
  Administered 2023-02-12: 101 mL via OPHTHALMIC

## 2023-02-12 SURGICAL SUPPLY — 11 items
CATARACT SUITE SIGHTPATH (MISCELLANEOUS) ×1
DISSECTOR HYDRO NUCLEUS 50X22 (MISCELLANEOUS) ×1 IMPLANT
FEE CATARACT SUITE SIGHTPATH (MISCELLANEOUS) ×1 IMPLANT
GLOVE SURG GAMMEX PI TX LF 7.5 (GLOVE) ×1 IMPLANT
GLOVE SURG SYN 8.5 E (GLOVE) ×1
GLOVE SURG SYN 8.5 PF PI (GLOVE) ×1 IMPLANT
LENS IOL TECNIS EYHANCE 22.5 (Intraocular Lens) IMPLANT
NDL FILTER BLUNT 18X1 1/2 (NEEDLE) ×1 IMPLANT
NEEDLE FILTER BLUNT 18X1 1/2 (NEEDLE) ×1
SYR 3ML LL SCALE MARK (SYRINGE) ×1 IMPLANT
SYR 5ML LL (SYRINGE) ×1 IMPLANT

## 2023-02-12 NOTE — Transfer of Care (Signed)
Immediate Anesthesia Transfer of Care Note  Patient: Whitney Molina  Procedure(s) Performed: CATARACT EXTRACTION PHACO AND INTRAOCULAR LENS PLACEMENT (IOC) LEFT 4.09 00:29.3 (Left)  Patient Location: PACU  Anesthesia Type: MAC  Level of Consciousness: awake, alert  and patient cooperative  Airway and Oxygen Therapy: Patient Spontanous Breathing and Patient connected to supplemental oxygen  Post-op Assessment: Post-op Vital signs reviewed, Patient's Cardiovascular Status Stable, Respiratory Function Stable, Patent Airway and No signs of Nausea or vomiting  Post-op Vital Signs: Reviewed and stable  Complications: No notable events documented.

## 2023-02-12 NOTE — Anesthesia Postprocedure Evaluation (Signed)
Anesthesia Post Note  Patient: Primrose A Hartog  Procedure(s) Performed: CATARACT EXTRACTION PHACO AND INTRAOCULAR LENS PLACEMENT (IOC) LEFT 4.09 00:29.3 (Left)  Patient location during evaluation: PACU Anesthesia Type: MAC Level of consciousness: awake and alert Pain management: pain level controlled Vital Signs Assessment: post-procedure vital signs reviewed and stable Respiratory status: spontaneous breathing, nonlabored ventilation, respiratory function stable and patient connected to nasal cannula oxygen Cardiovascular status: stable and blood pressure returned to baseline Postop Assessment: no apparent nausea or vomiting Anesthetic complications: no   No notable events documented.   Last Vitals:  Vitals:   02/12/23 0857 02/12/23 0903  BP: (!) 144/74 139/76  Pulse: (!) 53 (!) 55  Resp: 12 15  Temp: 36.4 C   SpO2: 96% 96%    Last Pain:  Vitals:   02/12/23 0903  TempSrc:   PainSc: 0-No pain                 Marisue Humble

## 2023-02-12 NOTE — H&P (Signed)
[facility]   Primary Care Physician:  Sherlene Shams, MD Ophthalmologist: Dr. Willey Blade  Pre-Procedure History & Physical: HPI:  Whitney Molina is a 82 y.o. female here for cataract surgery.   Past Medical History:  Diagnosis Date   Atrial fibrillation (HCC)    Basal cell carcinoma of nose    Bronchitis    Follicular lymphoma (HCC)    GERD (gastroesophageal reflux disease)    Grade II diastolic dysfunction    Heart murmur    Hemorrhoids    History of basal cell carcinoma    right upper lip, right ant shoulder   History of colon polyps    History of dysplastic nevus 12/05/2001   left paraspinal upper back, upper back spinal   History of kidney stones    History of mammogram 2016   History of squamous cell carcinoma 02/07/2005   left ant chest   Hyperlipidemia    Hypertension    Mild aortic regurgitation    Mild cognitive impairment    Mild dementia with anxiety (HCC)    Mild tricuspid regurgitation by prior echocardiogram    Moderate mitral regurgitation by prior echocardiogram    Non Hodgkin's lymphoma (HCC)    Non Hodgkin's lymphoma (HCC)    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   removed at [facility]    Past Surgical History:  Procedure Laterality Date   ABDOMINAL HYSTERECTOMY  1984   BLADDER SUSPENSION  2010   CARDIOVERSION N/A 11/30/2020   Procedure: CARDIOVERSION;  Surgeon: Debbe Odea, MD;  Location: [facility] ORS;  Service: Cardiovascular;  Laterality: N/A;   CATARACT EXTRACTION W/PHACO Right 01/29/2023   Procedure: CATARACT EXTRACTION PHACO AND INTRAOCULAR LENS PLACEMENT (IOC) RIGHT 2.54 00:22.1;  Surgeon: Nevada Crane, MD;  Location: [facility];  Service: Ophthalmology;  Laterality: Right;   CHOLECYSTECTOMY  06-01-03   COLONOSCOPY  2003   EXTRACORPOREAL SHOCK WAVE LITHOTRIPSY Left 06/16/2021   Procedure: EXTRACORPOREAL SHOCK WAVE LITHOTRIPSY (ESWL);  Surgeon: Vanna Scotland, MD;  Location: [facility] ORS;  Service:  Urology;  Laterality: Left;   LITHOTRIPSY  2005   local exicsion skin cancer  2004   squamous cell of chest wall. left chest   OOPHORECTOMY     TONSILLECTOMY  1971   TUBAL LIGATION  1975   VAGINAL PROLAPSE REPAIR  July 2001   Pelvic Prolapse    Prior to Admission medications   Medication Sig Start Date End Date Taking? Authorizing Provider  amiodarone (PACERONE) 200 MG tablet Take 1 tablet (200 mg total) by mouth daily. 01/17/23  Yes Agbor-Etang, Arlys John, MD  amLODipine (NORVASC) 5 MG tablet Take 1 tablet (5 mg total) by mouth daily. 10/31/22  Yes Sherlene Shams, MD  Apoaequorin (PREVAGEN PO) Take by mouth.   Yes [provider]  Cholecalciferol (VITAMIN D3) 1000 units CAPS Take 1,000 Units by mouth daily.   Yes [provider]  Co-Enzyme Q10 200 MG CAPS Take 1 tablet by mouth. Taking 1 every other day   Yes [provider]  losartan (COZAAR) 100 MG tablet Take 1 tablet (100 mg total) by mouth daily. 10/31/22  Yes Sherlene Shams, MD  pantoprazole (PROTONIX) 40 MG tablet Take 40 mg by mouth daily.   Yes [provider]  potassium chloride SA (KLOR-CON M) 20 MEQ tablet TAKE 1 TABLET BY MOUTH TWICE DAILY 02/02/23  Yes Earna Coder, MD  rosuvastatin (CRESTOR) 20 MG tablet Take 1 tablet (20 mg total) by mouth  at bedtime. 07/25/22  Yes Sherlene Shams, MD  albuterol (VENTOLIN HFA) 108 (90 Base) MCG/ACT inhaler INHALE 1 PUFFS INTO THE LUNGS EVERY 6 HOURS AS NEEDED FOR WHEEZING OR SHORTNESS OF BREATH Patient not taking: Reported on 01/23/2023 11/24/20   Glenford Bayley, NP  apixaban (ELIQUIS) 2.5 MG TABS tablet Take 1 tablet (2.5 mg total) by mouth 2 (two) times daily. Patient not taking: Reported on 02/12/2023 08/16/22   Debbe Odea, MD    Allergies as of 12/27/2022   (No Known Allergies)    Family History  Problem Relation Age of Onset   Stroke Mother    Breast cancer Mother 46   Stroke Father    Dementia Father    Cancer Brother         bladder   Stroke Brother    Heart attack Brother    Breast cancer Paternal Aunt 33   Bipolar disorder Son     Social History   Socioeconomic History   Marital status: Married    Spouse name: Not on file   Number of children: Not on file   Years of education: Not on file   Highest education level: Not on file  Occupational History   Not on file  Tobacco Use   Smoking status: Never   Smokeless tobacco: Never  Vaping Use   Vaping status: Never Used  Substance and Sexual Activity   Alcohol use: No   Drug use: No   Sexual activity: Yes  Other Topics Concern   Not on file  Social History Narrative   Not on file   Social Determinants of Health   Financial Resource Strain: Low Risk  (09/15/2020)   Overall Financial Resource Strain (CARDIA)    Difficulty of Paying Living Expenses: Not hard at all  Food Insecurity: No Food Insecurity (09/15/2020)   Hunger Vital Sign    Worried About Running Out of Food in the Last Year: Never true    Ran Out of Food in the Last Year: Never true  Transportation Needs: No Transportation Needs (09/15/2020)   PRAPARE - Administrator, Civil Service (Medical): No    Lack of Transportation (Non-Medical): No  Physical Activity: Insufficiently Active (09/15/2020)   Exercise Vital Sign    Days of Exercise per Week: 4 days    Minutes of Exercise per Session: 20 min  Stress: No Stress Concern Present (09/15/2020)   [employer] of Occupational Health - Occupational Stress Questionnaire    Feeling of Stress : Not at all  Social Connections: Unknown (09/15/2020)   Social Connection and Isolation Panel [NHANES]    Frequency of Communication with Friends and Family: Not on file    Frequency of Social Gatherings with Friends and Family: Not on file    Attends Religious Services: Not on file    Active Member of Clubs or Organizations: Not on file    Attends Banker Meetings: Not on file    Marital Status: Married  Intimate Partner  Violence: Not At Risk (09/15/2020)   Humiliation, Afraid, Rape, and Kick questionnaire    Fear of Current or Ex-Partner: No    Emotionally Abused: No    Physically Abused: No    Sexually Abused: No    Review of Systems: See HPI, otherwise negative ROS  Physical Exam: BP (!) 143/70   Pulse (!) 54   Temp 98.1 F (36.7 C) (Temporal)   Resp 20   Ht 5\' 3"  (1.6 m)  Wt 52.2 kg   SpO2 98%   BMI 20.37 kg/m  General:   Alert, cooperative in NAD Head:  Normocephalic and atraumatic. Respiratory:  Normal work of breathing. Cardiovascular:  RRR  Impression/Plan: Maryjayne A Dearing is here for cataract surgery.  Risks, benefits, limitations, and alternatives regarding cataract surgery have been reviewed with the patient.  Questions have been answered.  All parties agreeable.   Willey Blade, MD  02/12/2023, 8:32 AM

## 2023-02-12 NOTE — Op Note (Signed)
OPERATIVE NOTE  Whitney Molina [id number] 02/12/2023   PREOPERATIVE DIAGNOSIS:  Nuclear sclerotic cataract left eye.  H25.12   POSTOPERATIVE DIAGNOSIS:    Nuclear sclerotic cataract left eye.     PROCEDURE:  Phacoemusification with posterior chamber intraocular lens placement of the left eye   LENS:   Implant Name Type Inv. Item Serial No. Manufacturer Lot No. LRB No. Used Action  LENS IOL TECNIS EYHANCE 22.5 - [id number] Intraocular Lens LENS IOL TECNIS EYHANCE 22.5 [id number] [employer]  Left 1 Implanted      Procedure(s): CATARACT EXTRACTION PHACO AND INTRAOCULAR LENS PLACEMENT (IOC) LEFT 4.09 00:29.3 (Left)  DIB00 +22.5   SURGEON:  Willey Blade, MD, MPH   ANESTHESIA:  Topical with tetracaine drops augmented with 1% preservative-free intracameral lidocaine.  ESTIMATED BLOOD LOSS: <1 mL   COMPLICATIONS:  None.   DESCRIPTION OF PROCEDURE:  The patient was identified in the holding room and transported to the operating room and placed in the supine position under the operating microscope.  The left eye was identified as the operative eye and it was prepped and draped in the usual sterile ophthalmic fashion.   A 1.0 millimeter clear-corneal paracentesis was made at the 5:00 position. 0.5 ml of preservative-free 1% lidocaine with epinephrine was injected into the anterior chamber.  The anterior chamber was filled with viscoelastic.  A 2.4 millimeter keratome was used to make a near-clear corneal incision at the 2:00 position.  A curvilinear capsulorrhexis was made with a cystotome and capsulorrhexis forceps.  Balanced salt solution was used to hydrodissect and hydrodelineate the nucleus.   Phacoemulsification was then used in stop and chop fashion to remove the lens nucleus and epinucleus.  The remaining cortex was then removed using the irrigation and aspiration handpiece. Viscoelastic was then placed into the capsular bag to distend it for lens placement.  A lens was then  injected into the capsular bag.  The remaining viscoelastic was aspirated.   Wounds were hydrated with balanced salt solution.  The anterior chamber was inflated to a physiologic pressure with balanced salt solution.  Intracameral vigamox 0.1 mL undiltued was injected into the eye and a drop placed onto the ocular surface.  No wound leaks were noted.  The patient was taken to the recovery room in stable condition without complications of anesthesia or surgery  Willey Blade 02/12/2023, 8:54 AM

## 2023-02-15 ENCOUNTER — Encounter: Payer: Self-pay | Admitting: Medical

## 2023-02-15 ENCOUNTER — Ambulatory Visit: Payer: Medicare Other | Attending: Medical | Admitting: Medical

## 2023-02-15 VITALS — BP 132/70 | HR 53 | Ht 63.0 in | Wt 118.2 lb

## 2023-02-15 DIAGNOSIS — R7989 Other specified abnormal findings of blood chemistry: Secondary | ICD-10-CM | POA: Diagnosis not present

## 2023-02-15 DIAGNOSIS — Z79899 Other long term (current) drug therapy: Secondary | ICD-10-CM | POA: Diagnosis not present

## 2023-02-15 DIAGNOSIS — I10 Essential (primary) hypertension: Secondary | ICD-10-CM

## 2023-02-15 DIAGNOSIS — I4819 Other persistent atrial fibrillation: Secondary | ICD-10-CM

## 2023-02-15 DIAGNOSIS — E782 Mixed hyperlipidemia: Secondary | ICD-10-CM | POA: Diagnosis not present

## 2023-02-15 MED ORDER — APIXABAN 2.5 MG PO TABS: 2.5000 mg | ORAL_TABLET | Freq: Two times a day (BID) | ORAL | 1 refills | Status: DC

## 2023-02-15 MED ORDER — ROSUVASTATIN CALCIUM 20 MG PO TABS: 20.0000 mg | ORAL_TABLET | Freq: Every day | ORAL | 3 refills | Status: DC

## 2023-02-15 NOTE — Progress Notes (Signed)
Cardiology Office Note:    Date:  02/15/2023   ID:  Whitney Molina, DOB 1940-07-02, MRN [id number]  PCP:  Sherlene Shams, MD  [facility] Cardiologist:  Debbe Odea, MD  [facility] Electrophysiologist:  Lanier Prude, MD   Referring MD: Sherlene Shams, MD   Chief Complaint: 3 month follow-up  History of Present Illness:    Whitney Molina is a 82 y.o. female with a hx of  HTN, HLD, CKD stage 3, and persistent afib on Eliquis who presents for 3 month follow-up   First seen 07/09/20 for afib, referred by PCP. EKG showed afib with rate 120s. Patient was started on Lopressor and Eliquis. Echo showed EF 60-65%, normal LA size.  Last seen 11/26/20 and EKG showed persistent afib with heart rate 106bpm. BP at home showed systolics in the 80s. She was set up for cardioversion. She underwent successful cardioversion 11/30/20. Patient saw EP and was started on amiodarone.   The patient was seen 11/15/22 and was in SB with 1st degree AV block. Labs showed ALT 49 and AST 38.   Patient saw EP 01/17/23  reporting weakness, confusion and diaphoresis. Orthostatics were negative. She was in NSR. She was on Eliquis 2.5mg  BID. CBC and LFTS were updated. LFTS came back and were still mildly elevated, and amiodarone was decreased to 100mg  daily.  CBC was stable. Since that time she underwent cataract surgery.  Today, the patient reports she is overall doing well. EKG shows SB with 1st degree AV block. Most recent AST 53 and ALT 63. She is taking lower amiodarone dose. She is not taking Eliquis, says the office called to stop this medication.  She feels better off the Eliquis. Says she was having memory issues, tremors and weakness on the Eliquis. She is reluctant to re-start Eliquis. She needs refill of amlodipine.   I performed a chart review regarding discontinuation of Eliquis. Patient saw EP a month ago and was on Eliquis at that time. Discussed with Antionette Fairy, NP, who saw the  patient. We did not see any phone notes or calls saying to stop Eliquis.CBC looked good. Suspect it may have been prior to ?cataract surgery, and it was never restarted.   Past Medical History:  Diagnosis Date   Atrial fibrillation (HCC)    Basal cell carcinoma of nose    Bronchitis    Follicular lymphoma (HCC)    GERD (gastroesophageal reflux disease)    Grade II diastolic dysfunction    Heart murmur    Hemorrhoids    History of basal cell carcinoma    right upper lip, right ant shoulder   History of colon polyps    History of dysplastic nevus 12/05/2001   left paraspinal upper back, upper back spinal   History of kidney stones    History of mammogram 2016   History of squamous cell carcinoma 02/07/2005   left ant chest   Hyperlipidemia    Hypertension    Mild aortic regurgitation    Mild cognitive impairment    Mild dementia with anxiety (HCC)    Mild tricuspid regurgitation by prior echocardiogram    Moderate mitral regurgitation by prior echocardiogram    Non Hodgkin's lymphoma (HCC)    Non Hodgkin's lymphoma (HCC)    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   removed at [facility]    Past Surgical History:  Procedure Laterality Date   ABDOMINAL HYSTERECTOMY  1984   BLADDER SUSPENSION  2010  CARDIOVERSION N/A 11/30/2020   Procedure: CARDIOVERSION;  Surgeon: Debbe Odea, MD;  Location: [facility];  Service: Cardiovascular;  Laterality: N/A;   CATARACT EXTRACTION W/PHACO Right 01/29/2023   Procedure: CATARACT EXTRACTION PHACO AND INTRAOCULAR LENS PLACEMENT (IOC) RIGHT 2.54 00:22.1;  Surgeon: Nevada Crane, MD;  Location: [facility];  Service: Ophthalmology;  Laterality: Right;   CATARACT EXTRACTION W/PHACO Left 02/12/2023   Procedure: CATARACT EXTRACTION PHACO AND INTRAOCULAR LENS PLACEMENT (IOC) LEFT 4.09 00:29.3;  Surgeon: Nevada Crane, MD;  Location: [facility];  Service: Ophthalmology;  Laterality: Left;   CHOLECYSTECTOMY   06-01-03   COLONOSCOPY  2003   EXTRACORPOREAL SHOCK WAVE LITHOTRIPSY Left 06/16/2021   Procedure: EXTRACORPOREAL SHOCK WAVE LITHOTRIPSY (ESWL);  Surgeon: Vanna Scotland, MD;  Location: [facility] OR[facility];  Service: Urology;  Laterality: Left;   LITHOTRIPSY  2005   local exicsion skin cancer  2004   squamous cell of chest wall. left chest   OOPHORECTOMY     TONSILLECTOMY  1971   TUBAL LIGATION  1975   VAGINAL PROLAPSE REPAIR  July 2001   Pelvic Prolapse    Current Medications: Current Meds  Medication Sig   amiodarone (PACERONE) 200 MG tablet Take 1 tablet (200 mg total) by mouth daily.   amLODipine (NORVASC) 5 MG tablet Take 1 tablet (5 mg total) by mouth daily.   Apoaequorin (PREVAGEN PO) Take by mouth.   Cholecalciferol (VITAMIN D3) 1000 units CAPS Take 1,000 Units by mouth daily.   Co-Enzyme Q10 200 MG CAPS Take 1 tablet by mouth. Taking 1 every other day   losartan (COZAAR) 100 MG tablet Take 1 tablet (100 mg total) by mouth daily.   pantoprazole (PROTONIX) 40 MG tablet Take 40 mg by mouth daily.   potassium chloride SA (KLOR-CON M) 20 MEQ tablet TAKE 1 TABLET BY MOUTH TWICE DAILY   [DISCONTINUED] rosuvastatin (CRESTOR) 20 MG tablet Take 1 tablet (20 mg total) by mouth at bedtime.     Allergies:   Patient has no known allergies.   Social History   Socioeconomic History   Marital status: Married    Spouse name: Not on file   Number of children: Not on file   Years of education: Not on file   Highest education level: Not on file  Occupational History   Not on file  Tobacco Use   Smoking status: Never   Smokeless tobacco: Never  Vaping Use   Vaping status: Never Used  Substance and Sexual Activity   Alcohol use: No   Drug use: No   Sexual activity: Yes  Other Topics Concern   Not on file  Social History Narrative   Not on file   Social Determinants of Health   Financial Resource Strain: Low Risk  (09/15/2020)   Overall Financial Resource Strain (CARDIA)    Difficulty of  Paying Living Expenses: Not hard at all  Food Insecurity: No Food Insecurity (09/15/2020)   Hunger Vital Sign    Worried About Running Out of Food in the Last Year: Never true    Ran Out of Food in the Last Year: Never true  Transportation Needs: No Transportation Needs (09/15/2020)   PRAPARE - Administrator, Civil Service (Medical): No    Lack of Transportation (Non-Medical): No  Physical Activity: Insufficiently Active (09/15/2020)   Exercise Vital Sign    Days of Exercise per Week: 4 days    Minutes of Exercise per Session: 20 min  Stress: No Stress Concern Present (  09/15/2020)   [employer]ational [employer] - Occupational Stress Questionnaire    Feeling of Stress : Not at all  Social Connections: Unknown (09/15/2020)   Social Connection and Isolation Panel [NHANES]    Frequency of Communication with Friends and Family: Not on file    Frequency of Social Gatherings with Friends and Family: Not on file    Attends Religious Services: Not on Marketing executive or Organizations: Not on file    Attends Banker Meetings: Not on file    Marital Status: Married     Family History: The patient's family history includes Bipolar disorder in her son; Breast cancer (age of onset: 21) in her paternal aunt; Breast cancer (age of onset: 80) in her mother; Cancer in her brother; Dementia in her father; Heart attack in her brother; Stroke in her brother, father, and mother.  ROS:   Please see the history of present illness.     All other systems reviewed and are negative.  EKGs/Labs/Other Studies Reviewed:    The following studies were reviewed today:  Echo 07/2020 1. Left ventricular ejection fraction, by estimation, is 60 to 65%. The  left ventricle has normal function. The left ventricle has no regional  wall motion abnormalities. Left ventricular diastolic parameters are  consistent with Grade II diastolic  dysfunction (pseudonormalization).   2.  Right ventricular systolic function is normal. The right ventricular  size is normal. There is normal pulmonary artery systolic pressure. The  estimated right ventricular systolic pressure is 29.6 mmHg.   3. The mitral valve is normal in structure. Moderate mitral valve  regurgitation. No evidence of mitral stenosis.   4. Tricuspid valve regurgitation is mild to moderate.   5. Aortic valve regurgitation is mild.   6. Rhythm is normal sinus   EKG:  EKG is ordered today.  The ekg ordered today demonstrates SB 1st degree AV block, LAD, TWI V1 and V2, no change  Recent Labs: 10/31/2022: Magnesium 1.9 01/17/2023: ALT 63; BUN 19; Creatinine, Ser 1.08; Hemoglobin 12.4; Platelets 202; Potassium 3.9; Sodium 142; TSH 0.788  Recent Lipid Panel    Component Value Date/Time   CHOL 129 10/31/2022 1203   TRIG 80.0 10/31/2022 1203   HDL 53.00 10/31/2022 1203   CHOLHDL 2 10/31/2022 1203   VLDL 16.0 10/31/2022 1203   LDLCALC 60 10/31/2022 1203   LDLDIRECT 53.0 10/31/2022 1203    Physical Exam:    VS:  BP 132/70 (BP Location: Left Arm, Patient Position: Sitting, Cuff Size: Normal)   Pulse (!) 53   Ht 5\' 3"  (1.6 m)   Wt 118 lb 3.2 oz (53.6 kg)   SpO2 98%   BMI 20.94 kg/m     Wt Readings from Last 3 Encounters:  02/15/23 118 lb 3.2 oz (53.6 kg)  02/12/23 115 lb (52.2 kg)  01/29/23 115 lb 9.6 oz (52.4 kg)     GEN:  Well nourished, well developed in no acute distress HEENT: Normal NECK: No JVD; No carotid bruits LYMPHATICS: No lymphadenopathy CARDIAC: bradycardia, RR, no murmurs, rubs, gallops RESPIRATORY:  Clear to auscultation without rales, wheezing or rhonchi  ABDOMEN: Soft, non-tender, non-distended MUSCULOSKELETAL:  No edema; No deformity  SKIN: Warm and dry NEUROLOGIC:  Alert and oriented x 3 PSYCHIATRIC:  Normal affect   ASSESSMENT:    1. Persistent atrial fibrillation (HCC)   2. Medication management   3. Essential hypertension   4. Hyperlipidemia, mixed   5. Abnormal LFTs  PLAN:    In order of problems listed above:  Persistent Afib Patient saw EP a month ago and was in SB at the time and was on low dose Eliquis. She subsequently had cataract surgery. She said she received a call to stop Eliquis, although after thorough chart review, I could not find any indication of this. I spoke with EP APP as well. I will restart Eliquis 2.5mg  BID. Follow-up with Lamberdt in 3 months to revisit blood thinner.   HTN BP is well controlled, continue Losartan and Toprol.  HLD LDL 53, continue Crestor 20mg  daily.   Abnormal LFTs Amiodarone decreased at the prior visit. LFTs were still mildly elevated. Re-check LFTs today.   Disposition: Follow up in 6 month(s) with Md/APP    Signed, Triston Lisanti David Stall, PA-C  02/15/2023 12:22 PM     [facility]

## 2023-02-15 NOTE — Patient Instructions (Addendum)
Medication Instructions:  Your physician recommends the following medication changes.  START TAKING: Eliquis 2.5 mg by mouth twice a day   *If you need a refill on your cardiac medications before your next appointment, please call your pharmacy*   Lab Work: Your provider would like for you to have following labs drawn today (LFT, CBC).     Testing/Procedures: No test ordered today    Follow-Up: At [facility], you and your health needs are our priority.  As part of our continuing mission to provide you with exceptional heart care, we have created designated Provider Care Teams.  These Care Teams include your primary Cardiologist (physician) and Advanced Practice Providers (APPs -  Physician Assistants and Nurse Practitioners) who all work together to provide you with the care you need, when you need it.  We recommend signing up for the patient portal called "MyChart".  Sign up information is provided on this After Visit Summary.  MyChart is used to connect with patients for Virtual Visits (Telemedicine).  Patients are able to view lab/test results, encounter notes, upcoming appointments, etc.  Non-urgent messages can be sent to your provider as well.   To learn more about what you can do with MyChart, go to [email or web address].    Your next appointment:   3 month(s)  Provider:   Dr. Lalla Brothers   Your physician recommends that you schedule a follow-up appointment in 6 months with Dr. Azucena Cecil

## 2023-02-16 LAB — CBC
Hematocrit: 38.4 % (ref 34.0–46.6)
Hemoglobin: 12.5 g/dL (ref 11.1–15.9)
MCH: 32.2 pg (ref 26.6–33.0)
MCHC: 32.6 g/dL (ref 31.5–35.7)
MCV: 99 fL — ABNORMAL HIGH (ref 79–97)
Platelets: 205 10*3/uL (ref 150–450)
RBC: 3.88 x10E6/uL (ref 3.77–5.28)
RDW: 13.4 % (ref 11.7–15.4)
WBC: 6.7 10*3/uL (ref 3.4–10.8)

## 2023-02-16 LAB — HEPATIC FUNCTION PANEL
ALT: 46 [IU]/L — ABNORMAL HIGH (ref 0–32)
AST: 47 [IU]/L — ABNORMAL HIGH (ref 0–40)
Albumin: 4.3 g/dL (ref 3.7–4.7)
Alkaline Phosphatase: 92 [IU]/L (ref 44–121)
Bilirubin Total: 0.5 mg/dL (ref 0.0–1.2)
Bilirubin, Direct: 0.14 mg/dL (ref 0.00–0.40)
Total Protein: 6.8 g/dL (ref 6.0–8.5)

## 2023-02-28 ENCOUNTER — Encounter: Payer: Self-pay | Admitting: Internal Medicine

## 2023-02-28 ENCOUNTER — Inpatient Hospital Stay: Payer: Medicare Other | Attending: Internal Medicine

## 2023-02-28 ENCOUNTER — Inpatient Hospital Stay: Payer: Medicare Other | Admitting: Internal Medicine

## 2023-02-28 VITALS — BP 154/81 | HR 56 | Temp 97.0°F | Resp 17 | Wt 117.0 lb

## 2023-02-28 DIAGNOSIS — Z7901 Long term (current) use of anticoagulants: Secondary | ICD-10-CM | POA: Diagnosis not present

## 2023-02-28 DIAGNOSIS — N183 Chronic kidney disease, stage 3 unspecified: Secondary | ICD-10-CM | POA: Diagnosis not present

## 2023-02-28 DIAGNOSIS — F03A0 Unspecified dementia, mild, without behavioral disturbance, psychotic disturbance, mood disturbance, and anxiety: Secondary | ICD-10-CM | POA: Diagnosis not present

## 2023-02-28 DIAGNOSIS — Z79899 Other long term (current) drug therapy: Secondary | ICD-10-CM | POA: Insufficient documentation

## 2023-02-28 DIAGNOSIS — Z87442 Personal history of urinary calculi: Secondary | ICD-10-CM | POA: Diagnosis not present

## 2023-02-28 DIAGNOSIS — R7401 Elevation of levels of liver transaminase levels: Secondary | ICD-10-CM | POA: Insufficient documentation

## 2023-02-28 DIAGNOSIS — C8213 Follicular lymphoma grade II, intra-abdominal lymph nodes: Secondary | ICD-10-CM | POA: Diagnosis not present

## 2023-02-28 DIAGNOSIS — I082 Rheumatic disorders of both aortic and tricuspid valves: Secondary | ICD-10-CM | POA: Insufficient documentation

## 2023-02-28 DIAGNOSIS — R011 Cardiac murmur, unspecified: Secondary | ICD-10-CM | POA: Diagnosis not present

## 2023-02-28 DIAGNOSIS — Z860101 Personal history of adenomatous and serrated colon polyps: Secondary | ICD-10-CM | POA: Diagnosis not present

## 2023-02-28 DIAGNOSIS — E876 Hypokalemia: Secondary | ICD-10-CM | POA: Diagnosis not present

## 2023-02-28 DIAGNOSIS — Z803 Family history of malignant neoplasm of breast: Secondary | ICD-10-CM | POA: Diagnosis not present

## 2023-02-28 DIAGNOSIS — E785 Hyperlipidemia, unspecified: Secondary | ICD-10-CM | POA: Insufficient documentation

## 2023-02-28 DIAGNOSIS — I10 Essential (primary) hypertension: Secondary | ICD-10-CM | POA: Insufficient documentation

## 2023-02-28 DIAGNOSIS — R197 Diarrhea, unspecified: Secondary | ICD-10-CM | POA: Insufficient documentation

## 2023-02-28 DIAGNOSIS — K219 Gastro-esophageal reflux disease without esophagitis: Secondary | ICD-10-CM | POA: Insufficient documentation

## 2023-02-28 DIAGNOSIS — I4891 Unspecified atrial fibrillation: Secondary | ICD-10-CM | POA: Insufficient documentation

## 2023-02-28 LAB — CMP (CANCER CENTER ONLY)
ALT: 52 U/L — ABNORMAL HIGH (ref 0–44)
AST: 47 U/L — ABNORMAL HIGH (ref 15–41)
Albumin: 4.1 g/dL (ref 3.5–5.0)
Alkaline Phosphatase: 70 U/L (ref 38–126)
Anion gap: 8 (ref 5–15)
BUN: 17 mg/dL (ref 8–23)
CO2: 27 mmol/L (ref 22–32)
Calcium: 8.7 mg/dL — ABNORMAL LOW (ref 8.9–10.3)
Chloride: 103 mmol/L (ref 98–111)
Creatinine: 1.14 mg/dL — ABNORMAL HIGH (ref 0.44–1.00)
GFR, Estimated: 48 mL/min — ABNORMAL LOW (ref >60)
Glucose, Bld: 87 mg/dL (ref 70–99)
Potassium: 3.3 mmol/L — ABNORMAL LOW (ref 3.5–5.1)
Sodium: 138 mmol/L (ref 135–145)
Total Bilirubin: 0.7 mg/dL (ref 0.3–1.2)
Total Protein: 7.1 g/dL (ref 6.5–8.1)

## 2023-02-28 LAB — CBC WITH DIFFERENTIAL (CANCER CENTER ONLY)
Abs Immature Granulocytes: 0.07 10*3/uL (ref 0.00–0.07)
Basophils Absolute: 0 10*3/uL (ref 0.0–0.1)
Basophils Relative: 1 %
Eosinophils Absolute: 0.1 10*3/uL (ref 0.0–0.5)
Eosinophils Relative: 2 %
HCT: 36.7 % (ref 36.0–46.0)
Hemoglobin: 12.4 g/dL (ref 12.0–15.0)
Immature Granulocytes: 1 %
Lymphocytes Relative: 22 %
Lymphs Abs: 1.2 10*3/uL (ref 0.7–4.0)
MCH: 32.1 pg (ref 26.0–34.0)
MCHC: 33.8 g/dL (ref 30.0–36.0)
MCV: 95.1 fL (ref 80.0–100.0)
Monocytes Absolute: 0.5 10*3/uL (ref 0.1–1.0)
Monocytes Relative: 10 %
Neutro Abs: 3.6 10*3/uL (ref 1.7–7.7)
Neutrophils Relative %: 64 %
Platelet Count: 179 10*3/uL (ref 150–400)
RBC: 3.86 MIL/uL — ABNORMAL LOW (ref 3.87–5.11)
RDW: 12.9 % (ref 11.5–15.5)
WBC Count: 5.5 10*3/uL (ref 4.0–10.5)
nRBC: 0 % (ref 0.0–0.2)

## 2023-02-28 LAB — LACTATE DEHYDROGENASE: LDH: 204 U/L — ABNORMAL HIGH (ref 98–192)

## 2023-02-28 NOTE — Assessment & Plan Note (Addendum)
#   RETROPERITONEAL LN [incidental/asymptomatic]- follicular lymphoma grade 2; likely stage II; status post rituximab. FEB 2019 PET scan NED- Clinically no evidence recurrence. will get scans on clinical basis; stable.  # hypokalemia- 3.3 ? Laxatives/causing diarrhea. continue Kdur 20 meq BID; will refill x ; and moving forward will refill with Dr.Tullo/PCP.   # Elevated LFTs- G-1- monitor for now.   # CKD- III-GFR 53-stable  [s/p Dr.Kolluru nephrology]   # A.fib- on eliquis- 2.5 mg stable.   # Tremors-benign versus others- off levbid- ? Memory issues-defer to PCP- stable.   # Hypocalcemia- ca- 8.7; on  ca+vit D- stable.   # DISPOSITION: # follow up in 6 months-MD/ labs-cbc/cmp,ldh-Dr.B  Cc: Dr.Tullo

## 2023-02-28 NOTE — Progress Notes (Signed)
Afib is better controlled. Trying to eat more.Energy is fairly good. She is still working. Her son and his family moved in with them. No fevers or night sweats No pain. No swollen lymph node.

## 2023-02-28 NOTE — Progress Notes (Signed)
[facility] OFFICE PROGRESS NOTE  Patient Care Team: Sherlene Shams, MD as PCP - General (Internal Medicine) Debbe Odea, MD as PCP - Cardiology (Cardiology) Lanier Prude, MD as PCP - Electrophysiology (Cardiology) Lemar Livings, Merrily Pew, MD (General Surgery) Earna Coder, MD as Consulting Physician (Oncology)   Cancer Staging  No matching staging information was found for the patient.    Oncology History Overview Note   # NOV 2016- FOLLICULAR LYMPHOMA G-2; [s/p Bx- RETROPERITONEAL/LEFT Peri-aortic LN ~ 2.5CM [incidental on CT scan in 2016; CT- Nov 2014- ~1CM]/ PET 2016-Left Peri-aortic LN; Suv ~15; Ext Iliac LN 6 mm; Feb 2017- Unchanged LN; START Rituxan q week x4 [finished march 15th]; PET JAN 30th 2018- NED.   # Hx of Shingles [Sep 2016]-   # Kidney cysts [previous Dr.Wolfe]; Afib on eliquis [Dr.Brian; [facility]]  DIAGNOSIS: Follicle lymphoma-G-2  STAGE: Stage II       ;GOALS: Control  CURRENT/MOST RECENT THERAPY: Surveillance    Follicular lymphoma grade ii, intra-abdominal lymph nodes (HCC)    INTERVAL HISTORY:Accompanied by husband.  Walk independently.   Whitney Molina 82 y.o.  female pleasant patient above history of stage II follicular lymphoma-grade 2 is here for follow-up.  In the interim patient has had cataract surgery.  Uneventful.  Chronic mild diarrhea on laxatives.  Not any worse.  She continues to be on K dur.   Patient is also on Eliquis.  For A. fib.  Mild overall weight loss.  Otherwise no night sweats.  Review of Systems  Constitutional:  Positive for malaise/fatigue and weight loss. Negative for chills, diaphoresis and fever.  HENT:  Negative for nosebleeds and sore throat.   Eyes:  Negative for double vision.  Respiratory:  Negative for cough, hemoptysis, sputum production, shortness of breath and wheezing.   Cardiovascular:  Negative for chest pain, palpitations, orthopnea and leg swelling.  Gastrointestinal:   Negative for abdominal pain, blood in stool, constipation, diarrhea, heartburn, melena, nausea and vomiting.  Genitourinary:  Negative for dysuria, frequency and urgency.  Musculoskeletal:  Negative for back pain and joint pain.  Skin: Negative.  Negative for itching and rash.  Neurological:  Negative for dizziness, tingling, focal weakness, weakness and headaches.  Endo/Heme/Allergies:  Does not bruise/bleed easily.  Psychiatric/Behavioral:  Negative for depression. The patient is not nervous/anxious and does not have insomnia.       PAST MEDICAL HISTORY :  Past Medical History:  Diagnosis Date   Atrial fibrillation (HCC)    Basal cell carcinoma of nose    Bronchitis    Follicular lymphoma (HCC)    GERD (gastroesophageal reflux disease)    Grade II diastolic dysfunction    Heart murmur    Hemorrhoids    History of basal cell carcinoma    right upper lip, right ant shoulder   History of colon polyps    History of dysplastic nevus 12/05/2001   left paraspinal upper back, upper back spinal   History of kidney stones    History of mammogram 2016   History of squamous cell carcinoma 02/07/2005   left ant chest   Hyperlipidemia    Hypertension    Mild aortic regurgitation    Mild cognitive impairment    Mild dementia with anxiety (HCC)    Mild tricuspid regurgitation by prior echocardiogram    Moderate mitral regurgitation by prior echocardiogram    Non Hodgkin's lymphoma (HCC)    Non Hodgkin's lymphoma (HCC)    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   removed at [facility]    PAST SURGICAL HISTORY :   Past Surgical History:  Procedure Laterality Date   ABDOMINAL HYSTERECTOMY  1984   BLADDER SUSPENSION  2010   CARDIOVERSION N/A 11/30/2020   Procedure: CARDIOVERSION;  Surgeon: Debbe Odea, MD;  Location: [facility];  Service: Cardiovascular;  Laterality: N/A;   CATARACT EXTRACTION W/PHACO Right 01/29/2023   Procedure: CATARACT EXTRACTION PHACO AND  INTRAOCULAR LENS PLACEMENT (IOC) RIGHT 2.54 00:22.1;  Surgeon: Nevada Crane, MD;  Location: [facility];  Service: Ophthalmology;  Laterality: Right;   CATARACT EXTRACTION W/PHACO Left 02/12/2023   Procedure: CATARACT EXTRACTION PHACO AND INTRAOCULAR LENS PLACEMENT (IOC) LEFT 4.09 00:29.3;  Surgeon: Nevada Crane, MD;  Location: [facility];  Service: Ophthalmology;  Laterality: Left;   CHOLECYSTECTOMY  06-01-03   COLONOSCOPY  2003   EXTRACORPOREAL SHOCK WAVE LITHOTRIPSY Left 06/16/2021   Procedure: EXTRACORPOREAL SHOCK WAVE LITHOTRIPSY (ESWL);  Surgeon: Vanna Scotland, MD;  Location: [facility];  Service: Urology;  Laterality: Left;   LITHOTRIPSY  2005   local exicsion skin cancer  2004   squamous cell of chest wall. left chest   OOPHORECTOMY     TONSILLECTOMY  1971   TUBAL LIGATION  1975   VAGINAL PROLAPSE REPAIR  July 2001   Pelvic Prolapse    FAMILY HISTORY :   Family History  Problem Relation Age of Onset   Stroke Mother    Breast cancer Mother 56   Stroke Father    Dementia Father    Cancer Brother        bladder   Stroke Brother    Heart attack Brother    Breast cancer Paternal Aunt 74   Bipolar disorder Son     SOCIAL HISTORY:   Social History   Tobacco Use   Smoking status: Never   Smokeless tobacco: Never  Vaping Use   Vaping status: Never Used  Substance Use Topics   Alcohol use: No   Drug use: No    ALLERGIES:  has No Known Allergies.  MEDICATIONS:  Current Outpatient Medications  Medication Sig Dispense Refill   albuterol (VENTOLIN HFA) 108 (90 Base) MCG/ACT inhaler INHALE 1 PUFFS INTO THE LUNGS EVERY 6 HOURS AS NEEDED FOR WHEEZING OR SHORTNESS OF BREATH 18 g 2   amiodarone (PACERONE) 200 MG tablet Take 1 tablet (200 mg total) by mouth daily. 90 tablet 0   amLODipine (NORVASC) 5 MG tablet Take 1 tablet (5 mg total) by mouth daily. 90 tablet 1   apixaban (ELIQUIS) 2.5 MG TABS tablet Take 1 tablet (2.5 mg total) by mouth 2 (two)  times daily. 180 tablet 1   Cholecalciferol (VITAMIN D3) 1000 units CAPS Take 1,000 Units by mouth daily.     Co-Enzyme Q10 200 MG CAPS Take 1 tablet by mouth. Taking 1 every other day     losartan (COZAAR) 100 MG tablet Take 1 tablet (100 mg total) by mouth daily. 90 tablet 1   pantoprazole (PROTONIX) 40 MG tablet Take 40 mg by mouth daily.     potassium chloride SA (KLOR-CON M) 20 MEQ tablet TAKE 1 TABLET BY MOUTH TWICE DAILY 60 tablet 3   rosuvastatin (CRESTOR) 20 MG tablet Take 1 tablet (20 mg total) by mouth at bedtime. 90 tablet 3   No current facility-administered medications for this visit.    PHYSICAL EXAMINATION: ECOG PERFORMANCE STATUS: 0 - Asymptomatic  BP (!) 154/81 (BP Location: Left Arm, Patient Position:  Sitting)   Pulse (!) 56   Temp (!) 97 F (36.1 C) (Tympanic)   Resp 17   Wt 117 lb (53.1 kg)   SpO2 99%   BMI 20.73 kg/m   Filed Weights   02/28/23 1022  Weight: 117 lb (53.1 kg)     Physical Exam HENT:     Head: Normocephalic and atraumatic.     Mouth/Throat:     Pharynx: No oropharyngeal exudate.  Eyes:     Pupils: Pupils are equal, round, and reactive to light.  Cardiovascular:     Rate and Rhythm: Normal rate. Rhythm irregular.  Pulmonary:     Effort: Pulmonary effort is normal. No respiratory distress.     Breath sounds: Normal breath sounds. No wheezing.  Abdominal:     General: Bowel sounds are normal. There is no distension.     Palpations: Abdomen is soft. There is no mass.     Tenderness: There is no abdominal tenderness. There is no guarding or rebound.  Musculoskeletal:        General: No tenderness. Normal range of motion.     Cervical back: Normal range of motion and neck supple.  Skin:    General: Skin is warm.  Neurological:     Mental Status: She is alert and oriented to person, place, and time.  Psychiatric:        Mood and Affect: Affect normal.     LABORATORY DATA:  I have reviewed the data as listed    Component Value  Date/Time   NA 138 02/28/2023 1005   NA 142 01/17/2023 1407   NA 138 09/10/2013 0758   K 3.3 (L) 02/28/2023 1005   K 3.3 (L) 09/10/2013 0758   CL 103 02/28/2023 1005   CL 100 09/10/2013 0758   CO2 27 02/28/2023 1005   CO2 32 09/10/2013 0758   GLUCOSE 87 02/28/2023 1005   GLUCOSE 85 09/10/2013 0758   BUN 17 02/28/2023 1005   BUN 19 01/17/2023 1407   BUN 13 09/10/2013 0758   CREATININE 1.14 (H) 02/28/2023 1005   CREATININE 0.96 12/22/2013 0857   CALCIUM 8.7 (L) 02/28/2023 1005   CALCIUM 9.2 09/10/2013 0758   PROT 7.1 02/28/2023 1005   PROT 6.8 02/15/2023 1100   PROT 8.3 (H) 09/10/2013 0758   ALBUMIN 4.1 02/28/2023 1005   ALBUMIN 4.3 02/15/2023 1100   ALBUMIN 3.5 09/10/2013 0758   AST 47 (H) 02/28/2023 1005   ALT 52 (H) 02/28/2023 1005   ALT 28 09/10/2013 0758   ALKPHOS 70 02/28/2023 1005   ALKPHOS 78 09/10/2013 0758   BILITOT 0.7 02/28/2023 1005   GFRNONAA 48 (L) 02/28/2023 1005   GFRNONAA 59 (L) 12/22/2013 0857   GFRAA >60 01/21/2020 1309   GFRAA 68 12/22/2013 0857    No results found for: "SPEP", "UPEP"  Lab Results  Component Value Date   WBC 5.5 02/28/2023   NEUTROABS 3.6 02/28/2023   HGB 12.4 02/28/2023   HCT 36.7 02/28/2023   MCV 95.1 02/28/2023   PLT 179 02/28/2023      Chemistry      Component Value Date/Time   NA 138 02/28/2023 1005   NA 142 01/17/2023 1407   NA 138 09/10/2013 0758   K 3.3 (L) 02/28/2023 1005   K 3.3 (L) 09/10/2013 0758   CL 103 02/28/2023 1005   CL 100 09/10/2013 0758   CO2 27 02/28/2023 1005   CO2 32 09/10/2013 0758   BUN 17 02/28/2023 1005  BUN 19 01/17/2023 1407   BUN 13 09/10/2013 0758   CREATININE 1.14 (H) 02/28/2023 1005   CREATININE 0.96 12/22/2013 0857      Component Value Date/Time   CALCIUM 8.7 (L) 02/28/2023 1005   CALCIUM 9.2 09/10/2013 0758   ALKPHOS 70 02/28/2023 1005   ALKPHOS 78 09/10/2013 0758   AST 47 (H) 02/28/2023 1005   ALT 52 (H) 02/28/2023 1005   ALT 28 09/10/2013 0758   BILITOT 0.7 02/28/2023  1005       RADIOGRAPHIC STUDIES: I have personally reviewed the radiological images as listed and agreed with the findings in the report. No results found.   ASSESSMENT & PLAN:  Follicular lymphoma grade ii, intra-abdominal lymph nodes (HCC) # RETROPERITONEAL LN [incidental/asymptomatic]- follicular lymphoma grade 2; likely stage II; status post rituximab. FEB 2019 PET scan NED- Clinically no evidence recurrence. will get scans on clinical basis; stable.  # hypokalemia- 3.3 ? Laxatives/causing diarrhea. continue Kdur 20 meq BID; will refill x ; and moving forward will refill with Dr.Tullo/PCP.   # Elevated LFTs- G-1- monitor for now.   # CKD- III-GFR 53-stable  [s/p Dr.Kolluru nephrology]   # A.fib- on eliquis- 2.5 mg stable.   # Tremors-benign versus others- off levbid- ? Memory issues-defer to PCP- stable.   # Hypocalcemia- ca- 8.7; on  ca+vit D- stable.   # DISPOSITION: # follow up in 6 months-MD/ labs-cbc/cmp,ldh-Dr.B  Cc: Dr.Tullo   No orders of the defined types were placed in this encounter.  All questions were answered. The patient knows to call the clinic with any problems, questions or concerns.      Earna Coder, MD 02/28/2023 11:03 AM

## 2023-03-07 DIAGNOSIS — Z961 Presence of intraocular lens: Secondary | ICD-10-CM | POA: Diagnosis not present

## 2023-04-16 ENCOUNTER — Other Ambulatory Visit: Payer: Self-pay

## 2023-04-16 MED ORDER — AMIODARONE HCL 200 MG PO TABS: 200.0000 mg | ORAL_TABLET | Freq: Every day | ORAL | 1 refills | Status: DC

## 2023-04-16 NOTE — Telephone Encounter (Signed)
Requested Prescriptions   Signed Prescriptions Disp Refills   amiodarone (PACERONE) 200 MG tablet 90 tablet 1    Sig: Take 1 tablet (200 mg total) by mouth daily.    Authorizing Provider: Debbe Odea    Ordering User: Feliberto Harts L   last visit: 02/15/23 with plan to f/u in 6 months. next visit: 08/10/23

## 2023-05-02 DIAGNOSIS — J209 Acute bronchitis, unspecified: Secondary | ICD-10-CM | POA: Diagnosis not present

## 2023-05-02 DIAGNOSIS — H903 Sensorineural hearing loss, bilateral: Secondary | ICD-10-CM | POA: Diagnosis not present

## 2023-05-02 DIAGNOSIS — H9312 Tinnitus, left ear: Secondary | ICD-10-CM | POA: Diagnosis not present

## 2023-05-23 ENCOUNTER — Ambulatory Visit: Payer: Medicare Other | Attending: Cardiology | Admitting: Cardiology

## 2023-05-23 NOTE — Progress Notes (Deleted)
  Electrophysiology Office Follow up Visit Note:    Date:  05/23/2023   ID:  Whitney Molina, DOB [birth year], MRN [id number]  PCP:  Marylynn Verneita CROME, MD  [facility] Cardiologist:  Redell Cave, MD  [facility] Electrophysiologist:  OLE ONEIDA HOLTS, MD    Interval History:     Whitney Molina is a 83 y.o. female who presents for a follow up visit.  I last saw the patient in October 2022 for her atrial fibrillation.  The patient's medical history also includes hypertension, hyperlipidemia, follicular lymphoma, osteoporosis, thrombophilia, CKD 3B.  At the appointment with me we continued amiodarone  200 mg by mouth once daily.  She is on Eliquis  for stroke prophylaxis. She saw Cadence in clinic February 15, 2023.  That was to follow-up recent dose reduction in the amiodarone  to 100 mg by mouth once daily.  Between the time I saw her and she saw Cadence, the patient stopped taking her anticoagulant given off target effects.  She reported memory issues, tremors and weakness while taking Eliquis .  At the appointment with cadence she restarted Eliquis  2.5 mg by mouth twice daily.        Past medical, surgical, social and family history were reviewed.  ROS:   Please see the history of present illness.    All other systems reviewed and are negative.  EKGs/Labs/Other Studies Reviewed:    The following studies were reviewed today:  February 15, 2023 EKG shows sinus bradycardia with ventricular rate of 53 bpm  Lab work from February 28, 2023 shows an elevated (stable) AST and ALT.  Thyroid  labs from September were normal.        Physical Exam:    VS:  There were no vitals taken for this visit.    Wt Readings from Last 3 Encounters:  02/28/23 117 lb (53.1 kg)  02/15/23 118 lb 3.2 oz (53.6 kg)  02/12/23 115 lb (52.2 kg)     GEN: no distress.  Elderly CARD: RRR, No MRG RESP: No IWOB. CTAB.      ASSESSMENT:    1. Persistent atrial fibrillation (HCC)   2.  Encounter for long-term (current) use of high-risk medication    PLAN:    In order of problems listed above:  #Persistent atrial fibrillation #High risk med monitoring-amiodarone  The patient is maintaining sinus rhythm on amiodarone  100 L by mouth once daily.  Continue this dose for now. Continue Eliquis  2.5 mg by mouth twice daily for stroke prophylaxis She should follow-up with clinic in 6 months.  Repeat blood work at that time.     Signed, Ole Holts, MD, [facility], [facility] 05/23/2023 1:17 PM    Electrophysiology  [facility]

## 2023-05-28 ENCOUNTER — Other Ambulatory Visit: Payer: Self-pay | Admitting: Internal Medicine

## 2023-05-28 NOTE — Telephone Encounter (Signed)
 Component Ref Range & Units (hover) 2 mo ago (02/28/23) 3 mo ago (02/15/23) 4 mo ago (01/17/23) 6 mo ago (10/31/22) 9 mo ago (08/29/22) 1 yr ago (04/11/22) 1 yr ago (02/27/22)  Potassium 3.3 Low   3.9 R 3.9 R 2.9 Low  3.6 R 3.7

## 2023-07-05 ENCOUNTER — Other Ambulatory Visit: Payer: Self-pay

## 2023-07-05 ENCOUNTER — Emergency Department: Payer: Medicare Other

## 2023-07-05 ENCOUNTER — Emergency Department
Admission: EM | Admit: 2023-07-05 | Discharge: 2023-07-05 | Disposition: A | Discharge status: Home / Self Care | Payer: Medicare Other | Attending: Emergency Medicine | Admitting: Emergency Medicine

## 2023-07-05 DIAGNOSIS — Z043 Encounter for examination and observation following other accident: Secondary | ICD-10-CM | POA: Diagnosis not present

## 2023-07-05 DIAGNOSIS — I700 Atherosclerosis of aorta: Secondary | ICD-10-CM | POA: Diagnosis not present

## 2023-07-05 DIAGNOSIS — R001 Bradycardia, unspecified: Secondary | ICD-10-CM | POA: Diagnosis not present

## 2023-07-05 DIAGNOSIS — Z8572 Personal history of non-Hodgkin lymphomas: Secondary | ICD-10-CM | POA: Insufficient documentation

## 2023-07-05 DIAGNOSIS — M419 Scoliosis, unspecified: Secondary | ICD-10-CM | POA: Diagnosis not present

## 2023-07-05 DIAGNOSIS — J101 Influenza due to other identified influenza virus with other respiratory manifestations: Secondary | ICD-10-CM | POA: Insufficient documentation

## 2023-07-05 DIAGNOSIS — I639 Cerebral infarction, unspecified: Secondary | ICD-10-CM | POA: Diagnosis not present

## 2023-07-05 DIAGNOSIS — Z7901 Long term (current) use of anticoagulants: Secondary | ICD-10-CM | POA: Insufficient documentation

## 2023-07-05 DIAGNOSIS — R42 Dizziness and giddiness: Secondary | ICD-10-CM | POA: Diagnosis present

## 2023-07-05 DIAGNOSIS — J9811 Atelectasis: Secondary | ICD-10-CM | POA: Diagnosis not present

## 2023-07-05 DIAGNOSIS — I10 Essential (primary) hypertension: Secondary | ICD-10-CM | POA: Diagnosis not present

## 2023-07-05 DIAGNOSIS — R1111 Vomiting without nausea: Secondary | ICD-10-CM | POA: Diagnosis not present

## 2023-07-05 DIAGNOSIS — M50321 Other cervical disc degeneration at C4-C5 level: Secondary | ICD-10-CM | POA: Diagnosis not present

## 2023-07-05 DIAGNOSIS — R531 Weakness: Secondary | ICD-10-CM | POA: Diagnosis not present

## 2023-07-05 LAB — URINALYSIS, W/ REFLEX TO CULTURE (INFECTION SUSPECTED)
Bacteria, UA: NONE SEEN
Bilirubin Urine: NEGATIVE
Glucose, UA: NEGATIVE mg/dL
Ketones, ur: NEGATIVE mg/dL
Leukocytes,Ua: NEGATIVE
Nitrite: NEGATIVE
Protein, ur: NEGATIVE mg/dL
Specific Gravity, Urine: 1.006 (ref 1.005–1.030)
pH: 7 (ref 5.0–8.0)

## 2023-07-05 LAB — CBC WITH DIFFERENTIAL/PLATELET
Abs Immature Granulocytes: 0.01 10*3/uL (ref 0.00–0.07)
Basophils Absolute: 0 10*3/uL (ref 0.0–0.1)
Basophils Relative: 0 %
Eosinophils Absolute: 0 10*3/uL (ref 0.0–0.5)
Eosinophils Relative: 0 %
HCT: 36.1 % (ref 36.0–46.0)
Hemoglobin: 12.2 g/dL (ref 12.0–15.0)
Immature Granulocytes: 0 %
Lymphocytes Relative: 10 %
Lymphs Abs: 0.5 10*3/uL — ABNORMAL LOW (ref 0.7–4.0)
MCH: 32.4 pg (ref 26.0–34.0)
MCHC: 33.8 g/dL (ref 30.0–36.0)
MCV: 95.8 fL (ref 80.0–100.0)
Monocytes Absolute: 1 10*3/uL (ref 0.1–1.0)
Monocytes Relative: 19 %
Neutro Abs: 3.5 10*3/uL (ref 1.7–7.7)
Neutrophils Relative %: 71 %
Platelets: 165 10*3/uL (ref 150–400)
RBC: 3.77 MIL/uL — ABNORMAL LOW (ref 3.87–5.11)
RDW: 12.8 % (ref 11.5–15.5)
WBC: 5 10*3/uL (ref 4.0–10.5)
nRBC: 0 % (ref 0.0–0.2)

## 2023-07-05 LAB — COMPREHENSIVE METABOLIC PANEL
ALT: 149 U/L — ABNORMAL HIGH (ref 0–44)
AST: 203 U/L — ABNORMAL HIGH (ref 15–41)
Albumin: 3.7 g/dL (ref 3.5–5.0)
Alkaline Phosphatase: 67 U/L (ref 38–126)
Anion gap: 12 (ref 5–15)
BUN: 15 mg/dL (ref 8–23)
CO2: 25 mmol/L (ref 22–32)
Calcium: 8.6 mg/dL — ABNORMAL LOW (ref 8.9–10.3)
Chloride: 99 mmol/L (ref 98–111)
Creatinine, Ser: 1.13 mg/dL — ABNORMAL HIGH (ref 0.44–1.00)
GFR, Estimated: 49 mL/min — ABNORMAL LOW (ref >60)
Glucose, Bld: 101 mg/dL — ABNORMAL HIGH (ref 70–99)
Potassium: 3.5 mmol/L (ref 3.5–5.1)
Sodium: 136 mmol/L (ref 135–145)
Total Bilirubin: 0.8 mg/dL (ref 0.0–1.2)
Total Protein: 6.7 g/dL (ref 6.5–8.1)

## 2023-07-05 LAB — APTT: aPTT: 29 s (ref 24–36)

## 2023-07-05 LAB — RESP PANEL BY RT-PCR (RSV, FLU A&B, COVID)  RVPGX2
Influenza A by PCR: POSITIVE — AB
Influenza B by PCR: NEGATIVE
Resp Syncytial Virus by PCR: NEGATIVE
SARS Coronavirus 2 by RT PCR: NEGATIVE

## 2023-07-05 LAB — LACTIC ACID, PLASMA: Lactic Acid, Venous: 1.9 mmol/L (ref 0.5–1.9)

## 2023-07-05 LAB — PROTIME-INR
INR: 1.3 — ABNORMAL HIGH (ref 0.8–1.2)
Prothrombin Time: 16.2 s — ABNORMAL HIGH (ref 11.4–15.2)

## 2023-07-05 LAB — PROCALCITONIN: Procalcitonin: 0.16 ng/mL

## 2023-07-05 LAB — LIPASE, BLOOD: Lipase: 30 U/L (ref 11–51)

## 2023-07-05 MED ORDER — SODIUM CHLORIDE 0.9 % IV BOLUS (SEPSIS)
1000.0000 mL | Freq: Once | INTRAVENOUS | Status: AC
  Administered 2023-07-05: 1000 mL via INTRAVENOUS

## 2023-07-05 NOTE — ED Provider Notes (Signed)
 [facility] Provider Note    Event Date/Time   First MD Initiated Contact with Patient 07/05/23 [phone]     (approximate)   History   Chief Complaint: Fall   HPI  Whitney Molina is a 83 y.o. female with a history of hypertension, GERD, non-Hodgkin's lymphoma, atrial fibrillation on Eliquis who comes to the ED due to unwitnessed fall at home, hitting her head.  She reports that she vomited once this morning prior to falling.  She also had some lightheadedness.  Denies headache or chest pain.   She does also report that her grandson has been sick with influenza recently and she recently developed fatigue and decreased appetite and nonproductive cough over the last 2 days.  No diarrhea          Physical Exam   Triage Vital Signs: ED Triage Vitals  Encounter Vitals Group     BP --      Systolic BP Percentile --      Diastolic BP Percentile --      Pulse Rate 07/05/23 0923 (!) 58     Resp 07/05/23 0923 20     Temp --      Temp src --      SpO2 07/05/23 0923 100 %     Weight --      Height --      Head Circumference --      Peak Flow --      Pain Score 07/05/23 0921 0     Pain Loc --      Pain Education --      Exclude from Growth Chart --     Most recent vital signs: Vitals:   07/05/23 0930 07/05/23 1000  BP: (!) 151/68 (!) 121/48  Pulse: (!) 55 (!) 59  Resp: 17 17  Temp:    SpO2: 99% 98%    General: Awake, no distress.  CV:  Good peripheral perfusion.  Regular rate rhythm, no murmurs or rubs. Resp:  Normal effort.  Clear to auscultation bilaterally Abd:  No distention.  Soft nontender Other:  Somewhat dry oral mucosa, no obvious head trauma.  No C-spine point tenderness.   ED Results / Procedures / Treatments   Labs (all labs ordered are listed, but only abnormal results are displayed) Labs Reviewed  RESP PANEL BY RT-PCR (RSV, FLU A&B, COVID)  [id number] - Abnormal; Notable for the following components:      Result Value    Influenza A by PCR POSITIVE (*)    All other components within normal limits  COMPREHENSIVE METABOLIC PANEL - Abnormal; Notable for the following components:   Glucose, Bld 101 (*)    Creatinine, Ser 1.13 (*)    Calcium 8.6 (*)    AST 203 (*)    ALT 149 (*)    GFR, Estimated 49 (*)    All other components within normal limits  CBC WITH DIFFERENTIAL/PLATELET - Abnormal; Notable for the following components:   RBC 3.77 (*)    Lymphs Abs 0.5 (*)    All other components within normal limits  PROTIME-INR - Abnormal; Notable for the following components:   Prothrombin Time 16.2 (*)    INR 1.3 (*)    All other components within normal limits  URINALYSIS, W/ REFLEX TO CULTURE (INFECTION SUSPECTED) - Abnormal; Notable for the following components:   Color, Urine STRAW (*)    APPearance CLEAR (*)    Hgb urine dipstick SMALL (*)  All other components within normal limits  CULTURE, BLOOD (ROUTINE X 2)  CULTURE, BLOOD (ROUTINE X 2)  LACTIC ACID, PLASMA  APTT  LIPASE, BLOOD  PROCALCITONIN  LACTIC ACID, PLASMA     EKG Interpreted by me Sinus bradycardia rate of 57, normal axis, intervals, poor R wave progression.  Normal ST segments and T waves   RADIOLOGY Cxr interpreted by me, unremarkable. Radiology report reviewed.  CT head/c spine unremarkable.   PROCEDURES:  Procedures   MEDICATIONS ORDERED IN ED: Medications  sodium chloride 0.9 % bolus 1,000 mL (0 mLs Intravenous Stopped 07/05/23 1018)     IMPRESSION / MDM / ASSESSMENT AND PLAN / ED COURSE  I reviewed the triage vital signs and the nursing notes.  DDx: Intracranial hemorrhage, C-spine fracture, dehydration, AKI, electrolyte abnormality, anemia, UTI, influenza, COVID  Patient's presentation is most consistent with acute presentation with potential threat to life or bodily function.  Patient presents with lightheadedness and syncope with minor head trauma.  Vital signs unremarkable, exam is reassuring, neuro  intact.  Will give IV fluids for hydration due to her hypotension while obtaining lab workup, chest x-ray, CT head and cervical spine.  Doubt sepsis on initial assessment.  ----------------------------------------- 11:42 AM on 07/05/2023 ----------------------------------------- Workup reassuring. Flu +. Sepsis ruled out. Feeling better and tolerating PO. Stable for dc      FINAL CLINICAL IMPRESSION(S) / ED DIAGNOSES   Final diagnoses:  Influenza A     Rx / DC Orders   ED Discharge Orders     None        Note:  This document was prepared using Dragon voice recognition software and may include unintentional dictation errors.   Sharman Cheek, MD 07/05/23 [phone]

## 2023-07-05 NOTE — ED Triage Notes (Signed)
 Patient had unwitnessed fall, hit head; reports vomiting this morning prior to fall. Patient on Eliquis.   BP 80/50  135/85 after 1L of LR

## 2023-07-06 ENCOUNTER — Encounter: Payer: Self-pay | Admitting: Internal Medicine

## 2023-07-06 ENCOUNTER — Ambulatory Visit: Payer: Self-pay | Admitting: Internal Medicine

## 2023-07-06 ENCOUNTER — Other Ambulatory Visit: Payer: Self-pay | Admitting: Internal Medicine

## 2023-07-06 MED ORDER — OSELTAMIVIR PHOSPHATE 75 MG PO CAPS: 75.0000 mg | ORAL_CAPSULE | Freq: Two times a day (BID) | ORAL | 0 refills | Status: DC

## 2023-07-06 NOTE — Telephone Encounter (Signed)
 Spoke with pt and she stated that 3 days ago she started having symptoms of cough, congestion and fatigue. Pt stated that yesterday when she got up to walk into the kitchen she fell and hit her head. Pt went to the ED and they diagnosed her with Influenza A. Pt stated that she is still having symptoms of fatigue, and congestion. Pt is requesting Tamiflu.

## 2023-07-06 NOTE — Telephone Encounter (Signed)
 Pt is aware and gave a verbal understanding.

## 2023-07-06 NOTE — Telephone Encounter (Signed)
 Copied from CRM [id number]. Topic: Clinical - Medication Question >> Jul 06, 2023 10:02 AM Isabell A wrote: Reason for CRM: Patient seen at the hospital, tested positive for type A flu. Patient is requesting a medication to be called in - declined getting an appointment for a hospital follow up or virtual appointment.  Patient would like a callback.

## 2023-07-10 LAB — CULTURE, BLOOD (ROUTINE X 2)
Culture: NO GROWTH
Culture: NO GROWTH
Special Requests: ADEQUATE

## 2023-07-17 ENCOUNTER — Other Ambulatory Visit: Payer: Self-pay | Admitting: *Deleted

## 2023-07-17 MED ORDER — AMIODARONE HCL 200 MG PO TABS: 200.0000 mg | ORAL_TABLET | Freq: Every day | ORAL | 0 refills | Status: DC

## 2023-07-25 NOTE — Progress Notes (Unsigned)
 07/27/2023 10:57 AM   Whitney Molina 06/05/1940 [id number]  Referring provider: Sherlene Shams, MD [address] [place],  [place] [address]  Urological history: 1.  Left renal cyst -CTU 04/2021 - very large left parapelvic cyst, measuring at least 13.0 x 9.4 cm and a 3 cm left upper pole cyst  2. High risk hematuria -non-smoker -CTU 04/2021 -renal cysts -cysto 04/2021 - NED   3.  Nephrolithiasis -Stone composition calcium oxalate 95% -ESWL 06/16/2021  No chief complaint on file.   HPI: Whitney Molina is a 83 y.o. who presents today for a 6 month follow up.   KUB no stone seen.  She has not any issues with urination.  Patient denies any modifying or aggravating factors.  Patient denies any gross hematuria, dysuria or suprapubic/flank pain.  Patient denies any fevers, chills, nausea or vomiting.    UA yellow clear, specific gravity 1.015, trace blood, pH 7.0, 1+ protein, 4.0 urobilinogen, trace leukocyte, 6-10 WBCs, 0-2 RBCs, 0-10 epithelial cells, 0-10 renal epithelial cells, granular casts present, hyaline casts present, mucus threads present, bacteria moderate.  PMH: Past Medical History:  Diagnosis Date   Atrial fibrillation (HCC)    Basal cell carcinoma of nose    Bronchitis    Follicular lymphoma (HCC)    GERD (gastroesophageal reflux disease)    Grade II diastolic dysfunction    Heart murmur    Hemorrhoids    History of basal cell carcinoma    right upper lip, right ant shoulder   History of colon polyps    History of dysplastic nevus 12/05/2001   left paraspinal upper back, upper back spinal   History of kidney stones    History of mammogram 2016   History of squamous cell carcinoma 02/07/2005   left ant chest   Hyperlipidemia    Hypertension    Mild aortic regurgitation    Mild cognitive impairment    Mild dementia with anxiety (HCC)    Mild tricuspid regurgitation by prior echocardiogram    Moderate mitral regurgitation  by prior echocardiogram    Non Hodgkin's lymphoma (HCC)    Non Hodgkin's lymphoma (HCC)    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   removed at [facility]    Surgical History: Past Surgical History:  Procedure Laterality Date   ABDOMINAL HYSTERECTOMY  1984   BLADDER SUSPENSION  2010   CARDIOVERSION N/A 11/30/2020   Procedure: CARDIOVERSION;  Surgeon: Debbe Odea, MD;  Location: [facility];  Service: Cardiovascular;  Laterality: N/A;   CATARACT EXTRACTION W/PHACO Right 01/29/2023   Procedure: CATARACT EXTRACTION PHACO AND INTRAOCULAR LENS PLACEMENT (IOC) RIGHT 2.54 00:22.1;  Surgeon: [place] Crane, MD;  Location: [facility];  Service: Ophthalmology;  Laterality: Right;   CATARACT EXTRACTION W/PHACO Left 02/12/2023   Procedure: CATARACT EXTRACTION PHACO AND INTRAOCULAR LENS PLACEMENT (IOC) LEFT 4.09 00:29.3;  Surgeon: [place] Crane, MD;  Location: [facility];  Service: Ophthalmology;  Laterality: Left;   CHOLECYSTECTOMY  06-01-03   COLONOSCOPY  2003   EXTRACORPOREAL SHOCK WAVE LITHOTRIPSY Left 06/16/2021   Procedure: EXTRACORPOREAL SHOCK WAVE LITHOTRIPSY (ESWL);  Surgeon: Vanna Scotland, MD;  Location: [facility];  Service: Urology;  Laterality: Left;   LITHOTRIPSY  2005   local exicsion skin cancer  2004   squamous cell of chest wall. left chest   OOPHORECTOMY     TONSILLECTOMY  1971   TUBAL LIGATION  1975   VAGINAL PROLAPSE REPAIR  July 2001  Pelvic Prolapse    Home Medications:  Current Outpatient Medications on File Prior to Visit  Medication Sig Dispense Refill   albuterol (VENTOLIN HFA) 108 (90 Base) MCG/ACT inhaler INHALE 1 PUFFS INTO THE LUNGS EVERY 6 HOURS AS NEEDED FOR WHEEZING OR SHORTNESS OF BREATH 18 g 2   amiodarone (PACERONE) 200 MG tablet Take 1 tablet (200 mg total) by mouth daily. 90 tablet 0   amLODipine (NORVASC) 5 MG tablet TAKE 1 TABLET BY MOUTH ONCE DAILY 90 tablet 1   apixaban (ELIQUIS) 2.5 MG TABS tablet Take 1 tablet  (2.5 mg total) by mouth 2 (two) times daily. 180 tablet 1   Cholecalciferol (VITAMIN D3) 1000 units CAPS Take 1,000 Units by mouth daily.     Co-Enzyme Q10 200 MG CAPS Take 1 tablet by mouth. Taking 1 every other day     losartan (COZAAR) 100 MG tablet Take 1 tablet (100 mg total) by mouth daily. 90 tablet 1   oseltamivir (TAMIFLU) 75 MG capsule Take 1 capsule (75 mg total) by mouth 2 (two) times daily. 10 capsule 0   pantoprazole (PROTONIX) 40 MG tablet Take 40 mg by mouth daily.     potassium chloride SA (KLOR-CON M) 20 MEQ tablet TAKE 1 TABLET BY MOUTH TWICE DAILY 60 tablet 3   rosuvastatin (CRESTOR) 20 MG tablet Take 1 tablet (20 mg total) by mouth at bedtime. 90 tablet 3   No current facility-administered medications on file prior to visit.    Allergies: No Known Allergies  Family History: Family History  Problem Relation Age of Onset   Stroke Mother    Breast cancer Mother 43   Stroke Father    Dementia Father    Cancer Brother        bladder   Stroke Brother    Heart attack Brother    Breast cancer Paternal Aunt 60   Bipolar disorder Son     Social History:  reports that she has never smoked. She has never used smokeless tobacco. She reports that she does not drink alcohol and does not use drugs.  ROS: Pertinent ROS in HPI  Physical Exam: There were no vitals taken for this visit.  Constitutional:  Well nourished. Alert and oriented, No acute distress. HEENT: [place] AT, moist mucus membranes.  Trachea midline Cardiovascular: No clubbing, cyanosis, or edema. Respiratory: Normal respiratory effort, no increased work of breathing. Neurologic: Grossly intact, no focal deficits, moving all 4 extremities. Psychiatric: Normal mood and affect.    Laboratory Data: Results for orders placed or performed during the hospital encounter of 07/05/23  Resp panel by RT-PCR (RSV, Flu A&B, Covid) Anterior Nasal Swab   Collection Time: 07/05/23  9:25 AM   Specimen: Anterior Nasal Swab   Result Value Ref Range   SARS Coronavirus 2 by RT PCR NEGATIVE NEGATIVE   Influenza A by PCR POSITIVE (A) NEGATIVE   Influenza B by PCR NEGATIVE NEGATIVE   Resp Syncytial Virus by PCR NEGATIVE NEGATIVE  Blood Culture (routine x 2)   Collection Time: 07/05/23  9:28 AM   Specimen: BLOOD  Result Value Ref Range   Specimen Description BLOOD RAC    Special Requests      BOTTLES DRAWN AEROBIC AND ANAEROBIC Blood Culture adequate volume   Culture      NO GROWTH 5 DAYS Performed at [facility], [address], [place], [place] [address]    Report Status 07/10/2023 FINAL   Blood Culture (routine x 2)   Collection  Time: 07/05/23  9:28 AM   Specimen: BLOOD  Result Value Ref Range   Specimen Description BLOOD LEFT ANTECUBITAL    Special Requests      BOTTLES DRAWN AEROBIC AND ANAEROBIC Blood Culture results may not be optimal due to an inadequate volume of blood received in culture bottles   Culture      NO GROWTH 5 DAYS Performed at [facility], [address] [place], [place] [address]    Report Status 07/10/2023 FINAL   Lactic acid, plasma   Collection Time: 07/05/23  9:28 AM  Result Value Ref Range   Lactic Acid, Venous 1.9 0.5 - 1.9 mmol/L  Comprehensive metabolic panel   Collection Time: 07/05/23  9:28 AM  Result Value Ref Range   Sodium 136 135 - 145 mmol/L   Potassium 3.5 3.5 - 5.1 mmol/L   Chloride 99 98 - 111 mmol/L   CO2 25 22 - 32 mmol/L   Glucose, Bld 101 (H) 70 - 99 mg/dL   BUN 15 8 - 23 mg/dL   Creatinine, Ser 6.04 (H) 0.44 - 1.00 mg/dL   Calcium 8.6 (L) 8.9 - 10.3 mg/dL   Total Protein 6.7 6.5 - 8.1 g/dL   Albumin 3.7 3.5 - 5.0 g/dL   AST 540 (H) 15 - 41 U/L   ALT 149 (H) 0 - 44 U/L   Alkaline Phosphatase 67 38 - 126 U/L   Total Bilirubin 0.8 0.0 - 1.2 mg/dL   GFR, Estimated 49 (L) >60 mL/min   Anion gap 12 5 - 15  CBC with Differential   Collection Time: 07/05/23  9:28 AM  Result Value Ref Range   WBC 5.0 4.0 - 10.5 K/uL   RBC 3.77  (L) 3.87 - 5.11 MIL/uL   Hemoglobin 12.2 12.0 - 15.0 g/dL   HCT 98.1 19.1 - 47.8 %   MCV 95.8 80.0 - 100.0 fL   MCH 32.4 26.0 - 34.0 pg   MCHC 33.8 30.0 - 36.0 g/dL   RDW 29.5 62.1 - 30.8 %   Platelets 165 150 - 400 K/uL   nRBC 0.0 0.0 - 0.2 %   Neutrophils Relative % 71 %   Neutro Abs 3.5 1.7 - 7.7 K/uL   Lymphocytes Relative 10 %   Lymphs Abs 0.5 (L) 0.7 - 4.0 K/uL   Monocytes Relative 19 %   Monocytes Absolute 1.0 0.1 - 1.0 K/uL   Eosinophils Relative 0 %   Eosinophils Absolute 0.0 0.0 - 0.5 K/uL   Basophils Relative 0 %   Basophils Absolute 0.0 0.0 - 0.1 K/uL   Immature Granulocytes 0 %   Abs Immature Granulocytes 0.01 0.00 - 0.07 K/uL  Protime-INR   Collection Time: 07/05/23  9:28 AM  Result Value Ref Range   Prothrombin Time 16.2 (H) 11.4 - 15.2 seconds   INR 1.3 (H) 0.8 - 1.2  APTT   Collection Time: 07/05/23  9:28 AM  Result Value Ref Range   aPTT 29 24 - 36 seconds  Lipase, blood   Collection Time: 07/05/23  9:28 AM  Result Value Ref Range   Lipase 30 11 - 51 U/L  Procalcitonin   Collection Time: 07/05/23  9:28 AM  Result Value Ref Range   Procalcitonin 0.16 ng/mL  Urinalysis, w/ Reflex to Culture (Infection Suspected) -Urine, Clean Catch   Collection Time: 07/05/23 10:31 AM  Result Value Ref Range   Specimen Source URINE, CLEAN CATCH    Color, Urine STRAW (A) YELLOW  APPearance CLEAR (A) CLEAR   Specific Gravity, Urine 1.006 1.005 - 1.030   pH 7.0 5.0 - 8.0   Glucose, UA NEGATIVE NEGATIVE mg/dL   Hgb urine dipstick SMALL (A) NEGATIVE   Bilirubin Urine NEGATIVE NEGATIVE   Ketones, ur NEGATIVE NEGATIVE mg/dL   Protein, ur NEGATIVE NEGATIVE mg/dL   Nitrite NEGATIVE NEGATIVE   Leukocytes,Ua NEGATIVE NEGATIVE   RBC / HPF 0-5 0 - 5 RBC/hpf   WBC, UA 0-5 0 - 5 WBC/hpf   Bacteria, UA NONE SEEN NONE SEEN   Squamous Epithelial / HPF 0-5 0 - 5 /HPF   Mucus PRESENT   I have reviewed the labs.     Pertinent Imaging: N/A   Assessment & Plan:    1. High  risk hematuria -non-smoker -work up (2022) no worrisome findings -no reports of gross heme -UA negative   2. Nephrolithiasis -***  3. Renal cyst -Benign left peripelvic cyst on 2022 CT urogram -Serum creatinine stable  No follow-ups on file.  These notes generated with voice recognition software. I apologize for typographical errors.  Cloretta Ned  [facility] [address]  [address] [place], [place] [address] [phone]

## 2023-07-27 ENCOUNTER — Ambulatory Visit: Payer: Medicare Other | Admitting: Urology

## 2023-07-27 ENCOUNTER — Encounter: Payer: Self-pay | Admitting: Urology

## 2023-07-27 VITALS — BP 117/65 | HR 62

## 2023-07-27 DIAGNOSIS — N200 Calculus of kidney: Secondary | ICD-10-CM

## 2023-07-27 DIAGNOSIS — R319 Hematuria, unspecified: Secondary | ICD-10-CM

## 2023-07-27 DIAGNOSIS — N281 Cyst of kidney, acquired: Secondary | ICD-10-CM

## 2023-07-31 ENCOUNTER — Other Ambulatory Visit: Payer: Self-pay | Admitting: Internal Medicine

## 2023-08-10 ENCOUNTER — Ambulatory Visit: Payer: Medicare Other | Attending: Cardiology | Admitting: Cardiology

## 2023-08-10 ENCOUNTER — Encounter: Payer: Self-pay | Admitting: Cardiology

## 2023-08-10 VITALS — BP 138/68 | HR 55 | Ht 62.0 in | Wt 113.2 lb

## 2023-08-10 DIAGNOSIS — I4819 Other persistent atrial fibrillation: Secondary | ICD-10-CM

## 2023-08-10 DIAGNOSIS — E7800 Pure hypercholesterolemia, unspecified: Secondary | ICD-10-CM

## 2023-08-10 DIAGNOSIS — I10 Essential (primary) hypertension: Secondary | ICD-10-CM

## 2023-08-10 NOTE — Patient Instructions (Signed)
 Medication Instructions:  No changes at this time.   *If you need a refill on your cardiac medications before your next appointment, please call your pharmacy*  Lab Work: None  If you have labs (blood work) drawn today and your tests are completely normal, you will receive your results only by: MyChart Message (if you have MyChart) OR A paper copy in the mail If you have any lab test that is abnormal or we need to change your treatment, we will call you to review the results.  Testing/Procedures: None  Follow-Up: At [facility], you and your health needs are our priority.  As part of our continuing mission to provide you with exceptional heart care, our providers are all part of one team.  This team includes your primary Cardiologist (physician) and Advanced Practice Providers or APPs (Physician Assistants and Nurse Practitioners) who all work together to provide you with the care you need, when you need it.  Your next appointment:   1 year(s)  Provider:   You may see Debbe Odea, MD or one of the following Advanced Practice Providers on your designated Care Team:   Nicolasa Ducking, NP Ames Dura, PA-C Eula Listen, PA-C [facility], PA-C Charlsie Quest, NP Carlos Levering, NP

## 2023-08-10 NOTE — Progress Notes (Signed)
 Cardiology Office Note:    Date:  08/10/2023   ID:  Whitney Molina, DOB [birth year], MRN [id number]  PCP:  Sherlene Shams, MD   [facility]  Cardiologist:  Debbe Odea, MD  Advanced Practice Provider:  No care team member to display Electrophysiologist:  Lanier Prude, MD       Referring MD: Sherlene Shams, MD   Chief Complaint  Patient presents with   Follow-up    6 month follow up visit. Patient is doing well on today. Meds reviewed.     History of Present Illness:    Whitney Molina is a 83 y.o. female with a hx of hypertension, hyperlipidemia, persistent atrial fibrillation s/p DCCV 11/2020, on amiodarone who presents for follow-up.   States not having much of an appetite, has lost some weight over the past several months.  Denies palpitations, denies any bleeding issues with taking Eliquis.  Overall feels well with no concerns at this time.   Prior notes Echocardiogram 07/2020 normal systolic function, EF 60 to 65%, normal LA size. DCCV 11/2020 successful, follow-up in clinic with A. fib, amiodarone started.  Past Medical History:  Diagnosis Date   Atrial fibrillation (HCC)    Basal cell carcinoma of nose    Bronchitis    Follicular lymphoma (HCC)    GERD (gastroesophageal reflux disease)    Grade II diastolic dysfunction    Heart murmur    Hemorrhoids    History of basal cell carcinoma    right upper lip, right ant shoulder   History of colon polyps    History of dysplastic nevus 12/05/2001   left paraspinal upper back, upper back spinal   History of kidney stones    History of mammogram 2016   History of squamous cell carcinoma 02/07/2005   left ant chest   Hyperlipidemia    Hypertension    Mild aortic regurgitation    Mild cognitive impairment    Mild dementia with anxiety (HCC)    Mild tricuspid regurgitation by prior echocardiogram    Moderate mitral regurgitation by prior echocardiogram    Non Hodgkin's  lymphoma (HCC)    Non Hodgkin's lymphoma (HCC)    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   removed at [facility]    Past Surgical History:  Procedure Laterality Date   ABDOMINAL HYSTERECTOMY  1984   BLADDER SUSPENSION  2010   CARDIOVERSION N/A 11/30/2020   Procedure: CARDIOVERSION;  Surgeon: Debbe Odea, MD;  Location: [facility];  Service: Cardiovascular;  Laterality: N/A;   CATARACT EXTRACTION W/PHACO Right 01/29/2023   Procedure: CATARACT EXTRACTION PHACO AND INTRAOCULAR LENS PLACEMENT (IOC) RIGHT 2.54 00:22.1;  Surgeon: [place] Crane, MD;  Location: [facility];  Service: Ophthalmology;  Laterality: Right;   CATARACT EXTRACTION W/PHACO Left 02/12/2023   Procedure: CATARACT EXTRACTION PHACO AND INTRAOCULAR LENS PLACEMENT (IOC) LEFT 4.09 00:29.3;  Surgeon: [place] Crane, MD;  Location: [facility];  Service: Ophthalmology;  Laterality: Left;   CHOLECYSTECTOMY  06-01-03   COLONOSCOPY  2003   EXTRACORPOREAL SHOCK WAVE LITHOTRIPSY Left 06/16/2021   Procedure: EXTRACORPOREAL SHOCK WAVE LITHOTRIPSY (ESWL);  Surgeon: Vanna Scotland, MD;  Location: [facility];  Service: Urology;  Laterality: Left;   LITHOTRIPSY  2005   local exicsion skin cancer  2004   squamous cell of chest wall. left chest   OOPHORECTOMY     TONSILLECTOMY  1971   TUBAL LIGATION  1975   VAGINAL PROLAPSE REPAIR  July 2001   Pelvic Prolapse    Current Medications: Current Meds  Medication Sig   albuterol (VENTOLIN HFA) 108 (90 Base) MCG/ACT inhaler INHALE 1 PUFFS INTO THE LUNGS EVERY 6 HOURS AS NEEDED FOR WHEEZING OR SHORTNESS OF BREATH   amiodarone (PACERONE) 200 MG tablet Take 1 tablet (200 mg total) by mouth daily.   amLODipine (NORVASC) 5 MG tablet TAKE 1 TABLET BY MOUTH ONCE DAILY   apixaban (ELIQUIS) 2.5 MG TABS tablet Take 1 tablet (2.5 mg total) by mouth 2 (two) times daily.   Cholecalciferol (VITAMIN D3) 1000 units CAPS Take 1,000 Units by mouth daily.   Co-Enzyme Q10 200  MG CAPS Take 1 tablet by mouth. Taking 1 every other day   losartan (COZAAR) 100 MG tablet Take 1 tablet (100 mg total) by mouth daily.   pantoprazole (PROTONIX) 40 MG tablet Take 40 mg by mouth daily.   potassium chloride SA (KLOR-CON M) 20 MEQ tablet TAKE 1 TABLET BY MOUTH TWICE DAILY   rosuvastatin (CRESTOR) 20 MG tablet Take 1 tablet (20 mg total) by mouth at bedtime.     Allergies:   Patient has no known allergies.   Social History   Socioeconomic History   Marital status: Married    Spouse name: Not on file   Number of children: Not on file   Years of education: Not on file   Highest education level: Not on file  Occupational History   Not on file  Tobacco Use   Smoking status: Never   Smokeless tobacco: Never  Vaping Use   Vaping status: Never Used  Substance and Sexual Activity   Alcohol use: No   Drug use: No   Sexual activity: Yes  Other Topics Concern   Not on file  Social History Narrative   Not on file   Social Drivers of Health   Financial Resource Strain: Low Risk  (09/15/2020)   Overall Financial Resource Strain (CARDIA)    Difficulty of Paying Living Expenses: Not hard at all  Food Insecurity: No Food Insecurity (09/15/2020)   Hunger Vital Sign    Worried About Running Out of Food in the Last Year: Never true    Ran Out of Food in the Last Year: Never true  Transportation Needs: No Transportation Needs (09/15/2020)   PRAPARE - Administrator, Civil Service (Medical): No    Lack of Transportation (Non-Medical): No  Physical Activity: Insufficiently Active (09/15/2020)   Exercise Vital Sign    Days of Exercise per Week: 4 days    Minutes of Exercise per Session: 20 min  Stress: No Stress Concern Present (09/15/2020)   [employer] of Occupational Health - Occupational Stress Questionnaire    Feeling of Stress : Not at all  Social Connections: Unknown (09/15/2020)   Social Connection and Isolation Panel [NHANES]    Frequency of Communication  with Friends and Family: Not on file    Frequency of Social Gatherings with Friends and Family: Not on file    Attends Religious Services: Not on file    Active Member of Clubs or Organizations: Not on file    Attends Banker Meetings: Not on file    Marital Status: Married     Family History: The patient's family history includes Bipolar disorder in her son; Breast cancer (age of onset: 66) in her paternal aunt; Breast cancer (age of onset: 85) in her mother; Cancer in her brother; Dementia in her father; Heart attack in her  brother; Stroke in her brother, father, and mother.  ROS:   Please see the history of present illness.     All other systems reviewed and are negative.  EKGs/Labs/Other Studies Reviewed:    The following studies were reviewed today:   EKG Interpretation Date/Time:  Friday August 10 2023 10:06:33 EDT Ventricular Rate:  55 PR Interval:  232 QRS Duration:  78 QT Interval:  458 QTC Calculation: 438 R Axis:   -34  Text Interpretation: Sinus bradycardia with 1st degree A-V block Left axis deviation Confirmed by Debbe Odea ([id number]) on 08/10/2023 10:10:39 AM     Recent Labs: 10/31/2022: Magnesium 1.9 01/17/2023: TSH 0.788 07/05/2023: ALT 149; BUN 15; Creatinine, Ser 1.13; Hemoglobin 12.2; Platelets 165; Potassium 3.5; Sodium 136  Recent Lipid Panel    Component Value Date/Time   CHOL 129 10/31/2022 1203   TRIG 80.0 10/31/2022 1203   HDL 53.00 10/31/2022 1203   CHOLHDL 2 10/31/2022 1203   VLDL 16.0 10/31/2022 1203   LDLCALC 60 10/31/2022 1203   LDLDIRECT 53.0 10/31/2022 1203     Risk Assessment/Calculations:      Physical Exam:    VS:  BP 138/68   Pulse (!) 55   Ht 5\' 2"  (1.575 m)   Wt 113 lb 3.2 oz (51.3 kg)   SpO2 98%   BMI 20.70 kg/m     Wt Readings from Last 3 Encounters:  08/10/23 113 lb 3.2 oz (51.3 kg)  02/28/23 117 lb (53.1 kg)  02/15/23 118 lb 3.2 oz (53.6 kg)     GEN:  Well nourished, well developed in no acute  distress HEENT: Normal NECK: No JVD; No carotid bruits CARDIAC: Regular rate and rhythm RESPIRATORY:  Clear to auscultation without rales, wheezing or rhonchi  ABDOMEN: Soft, non-tender, non-distended MUSCULOSKELETAL:  No edema; No deformity  SKIN: Warm and dry NEUROLOGIC:  Alert and oriented x 3 PSYCHIATRIC:  Normal affect   ASSESSMENT:    1. Persistent atrial fibrillation (HCC)   2. Primary hypertension   3. Pure hypercholesterolemia    PLAN:    In order of problems listed above:  Paroxysmal atrial fibrillation A. fib s/p DC cardioversion 11/2020.  Maintaining sinus rhythm/SB on amiodarone.  CHA2DS2-VASc score 4 (age, htn, gender).  Echo with normal EF.  Continue Eliquis, amiodarone 200 mg daily.  Hypertension, BP controlled.  Continue losartan 100 mg daily, amlodipine 5 mg daily. Hyperlipidemia, LDL at goal.  Continue Crestor 20 mg daily.  Follow-up in 12 months   Medication Adjustments/Labs and Tests Ordered: Current medicines are reviewed at length with the patient today.  Concerns regarding medicines are outlined above.  Orders Placed This Encounter  Procedures   EKG 12-Lead    No orders of the defined types were placed in this encounter.     Patient Instructions  Medication Instructions:  No changes at this time.   *If you need a refill on your cardiac medications before your next appointment, please call your pharmacy*  Lab Work: None  If you have labs (blood work) drawn today and your tests are completely normal, you will receive your results only by: MyChart Message (if you have MyChart) OR A paper copy in the mail If you have any lab test that is abnormal or we need to change your treatment, we will call you to review the results.  Testing/Procedures: None  Follow-Up: At [facility], you and your health needs are our priority.  As part of our continuing mission to provide you with  exceptional heart care, our providers are all part of one  team.  This team includes your primary Cardiologist (physician) and Advanced Practice Providers or APPs (Physician Assistants and Nurse Practitioners) who all work together to provide you with the care you need, when you need it.  Your next appointment:   1 year(s)  Provider:   You may see Debbe Odea, MD or one of the following Advanced Practice Providers on your designated Care Team:   Nicolasa Ducking, NP Ames Dura, PA-C Eula Listen, PA-C [facility], PA-C Charlsie Quest, NP Carlos Levering, NP          Signed, Debbe Odea, MD  08/10/2023 12:07 PM    [facility]

## 2023-08-29 ENCOUNTER — Inpatient Hospital Stay: Payer: Medicare Other | Attending: Internal Medicine

## 2023-08-29 ENCOUNTER — Encounter: Payer: Self-pay | Admitting: Internal Medicine

## 2023-08-29 ENCOUNTER — Inpatient Hospital Stay: Payer: Medicare Other | Admitting: Internal Medicine

## 2023-08-29 DIAGNOSIS — Z7901 Long term (current) use of anticoagulants: Secondary | ICD-10-CM | POA: Diagnosis not present

## 2023-08-29 DIAGNOSIS — K219 Gastro-esophageal reflux disease without esophagitis: Secondary | ICD-10-CM | POA: Diagnosis not present

## 2023-08-29 DIAGNOSIS — I10 Essential (primary) hypertension: Secondary | ICD-10-CM | POA: Diagnosis not present

## 2023-08-29 DIAGNOSIS — I082 Rheumatic disorders of both aortic and tricuspid valves: Secondary | ICD-10-CM | POA: Insufficient documentation

## 2023-08-29 DIAGNOSIS — F03A0 Unspecified dementia, mild, without behavioral disturbance, psychotic disturbance, mood disturbance, and anxiety: Secondary | ICD-10-CM | POA: Diagnosis not present

## 2023-08-29 DIAGNOSIS — Z87442 Personal history of urinary calculi: Secondary | ICD-10-CM | POA: Insufficient documentation

## 2023-08-29 DIAGNOSIS — C8213 Follicular lymphoma grade II, intra-abdominal lymph nodes: Secondary | ICD-10-CM

## 2023-08-29 DIAGNOSIS — I4891 Unspecified atrial fibrillation: Secondary | ICD-10-CM | POA: Diagnosis not present

## 2023-08-29 DIAGNOSIS — Z79899 Other long term (current) drug therapy: Secondary | ICD-10-CM | POA: Insufficient documentation

## 2023-08-29 DIAGNOSIS — E785 Hyperlipidemia, unspecified: Secondary | ICD-10-CM | POA: Diagnosis not present

## 2023-08-29 LAB — CMP (CANCER CENTER ONLY)
ALT: 96 U/L — ABNORMAL HIGH (ref 0–44)
AST: 78 U/L — ABNORMAL HIGH (ref 15–41)
Albumin: 4 g/dL (ref 3.5–5.0)
Alkaline Phosphatase: 95 U/L (ref 38–126)
Anion gap: 6 (ref 5–15)
BUN: 22 mg/dL (ref 8–23)
CO2: 26 mmol/L (ref 22–32)
Calcium: 8.9 mg/dL (ref 8.9–10.3)
Chloride: 103 mmol/L (ref 98–111)
Creatinine: 0.78 mg/dL (ref 0.44–1.00)
GFR, Estimated: >60 mL/min (ref >60)
Glucose, Bld: 82 mg/dL (ref 70–99)
Potassium: 4.3 mmol/L (ref 3.5–5.1)
Sodium: 135 mmol/L (ref 135–145)
Total Bilirubin: 0.9 mg/dL (ref 0.0–1.2)
Total Protein: 7.2 g/dL (ref 6.5–8.1)

## 2023-08-29 LAB — CBC WITH DIFFERENTIAL (CANCER CENTER ONLY)
Abs Immature Granulocytes: 0.01 10*3/uL (ref 0.00–0.07)
Basophils Absolute: 0 10*3/uL (ref 0.0–0.1)
Basophils Relative: 1 %
Eosinophils Absolute: 0.1 10*3/uL (ref 0.0–0.5)
Eosinophils Relative: 2 %
HCT: 36.1 % (ref 36.0–46.0)
Hemoglobin: 12 g/dL (ref 12.0–15.0)
Immature Granulocytes: 0 %
Lymphocytes Relative: 18 %
Lymphs Abs: 1 10*3/uL (ref 0.7–4.0)
MCH: 32.5 pg (ref 26.0–34.0)
MCHC: 33.2 g/dL (ref 30.0–36.0)
MCV: 97.8 fL (ref 80.0–100.0)
Monocytes Absolute: 0.5 10*3/uL (ref 0.1–1.0)
Monocytes Relative: 8 %
Neutro Abs: 3.9 10*3/uL (ref 1.7–7.7)
Neutrophils Relative %: 71 %
Platelet Count: 202 10*3/uL (ref 150–400)
RBC: 3.69 MIL/uL — ABNORMAL LOW (ref 3.87–5.11)
RDW: 13.2 % (ref 11.5–15.5)
WBC Count: 5.6 10*3/uL (ref 4.0–10.5)
nRBC: 0 % (ref 0.0–0.2)

## 2023-08-29 LAB — LACTATE DEHYDROGENASE: LDH: 174 U/L (ref 98–192)

## 2023-08-29 NOTE — Progress Notes (Signed)
 Patient is worried about her appetite, since grandchildren moved in about a year ago, she stated noticing since she doesn't really cook much now, she hasn't been as hungry, so she started drinking the Ensure vanilla drinks, once a day.

## 2023-08-29 NOTE — Progress Notes (Signed)
 [facility] OFFICE PROGRESS NOTE  Patient Care Team: Sherlene Shams, MD as PCP - General (Internal Medicine) Debbe Odea, MD as PCP - Cardiology (Cardiology) Lanier Prude, MD as PCP - Electrophysiology (Cardiology) Lemar Livings, Merrily Pew, MD (General Surgery) Earna Coder, MD as Consulting Physician (Oncology)   Cancer Staging  No matching staging information was found for the patient.    Oncology History Overview Note   # NOV 2016- FOLLICULAR LYMPHOMA G-2; [s/p Bx- RETROPERITONEAL/LEFT Peri-aortic LN ~ 2.5CM [incidental on CT scan in 2016; CT- Nov 2014- ~1CM]/ PET 2016-Left Peri-aortic LN; Suv ~15; Ext Iliac LN 6 mm; Feb 2017- Unchanged LN; START Rituxan q week x4 [finished march 15th]; PET JAN 30th 2018- NED.   # Hx of Shingles [Sep 2016]-   # Kidney cysts [previous Dr.Wolfe]; Afib on eliquis [Dr.Brian; [facility]]  DIAGNOSIS: Follicle lymphoma-G-2  STAGE: Stage II       ;GOALS: Control  CURRENT/MOST RECENT THERAPY: Surveillance    Follicular lymphoma grade ii, intra-abdominal lymph nodes (HCC)    INTERVAL HISTORY:Accompanied by husband.  Walk independently.   Whitney Molina 83 y.o.  female pleasant patient above history of stage II follicular lymphoma-grade 2 on surveillance is here for follow-up.  Patient is worried about her appetite, since grandchildren moved in about a year ago. Loss of 4 pounds. So she started drinking the Ensure vanilla drinks    Patient is also on Eliquis.  For A. fib.  Otherwise no night sweats.  Review of Systems  Constitutional:  Positive for malaise/fatigue and weight loss. Negative for chills, diaphoresis and fever.  HENT:  Negative for nosebleeds and sore throat.   Eyes:  Negative for double vision.  Respiratory:  Negative for cough, hemoptysis, sputum production, shortness of breath and wheezing.   Cardiovascular:  Negative for chest pain, palpitations, orthopnea and leg swelling.  Gastrointestinal:   Negative for abdominal pain, blood in stool, constipation, diarrhea, heartburn, melena, nausea and vomiting.  Genitourinary:  Negative for dysuria, frequency and urgency.  Musculoskeletal:  Negative for back pain and joint pain.  Skin: Negative.  Negative for itching and rash.  Neurological:  Negative for dizziness, tingling, focal weakness, weakness and headaches.  Endo/Heme/Allergies:  Does not bruise/bleed easily.  Psychiatric/Behavioral:  Negative for depression. The patient is not nervous/anxious and does not have insomnia.       PAST MEDICAL HISTORY :  Past Medical History:  Diagnosis Date   Atrial fibrillation (HCC)    Basal cell carcinoma of nose    Bronchitis    Follicular lymphoma (HCC)    GERD (gastroesophageal reflux disease)    Grade II diastolic dysfunction    Heart murmur    Hemorrhoids    History of basal cell carcinoma    right upper lip, right ant shoulder   History of colon polyps    History of dysplastic nevus 12/05/2001   left paraspinal upper back, upper back spinal   History of kidney stones    History of mammogram 2016   History of squamous cell carcinoma 02/07/2005   left ant chest   Hyperlipidemia    Hypertension    Mild aortic regurgitation    Mild cognitive impairment    Mild dementia with anxiety (HCC)    Mild tricuspid regurgitation by prior echocardiogram    Moderate mitral regurgitation by prior echocardiogram    Non Hodgkin's lymphoma (HCC)    Non Hodgkin's lymphoma (HCC)    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   removed at [facility]    PAST SURGICAL HISTORY :   Past Surgical History:  Procedure Laterality Date   ABDOMINAL HYSTERECTOMY  1984   BLADDER SUSPENSION  2010   CARDIOVERSION N/A 11/30/2020   Procedure: CARDIOVERSION;  Surgeon: Constancia Delton, MD;  Location: [facility] ORS;  Service: Cardiovascular;  Laterality: N/A;   CATARACT EXTRACTION W/PHACO Right 01/29/2023   Procedure: CATARACT EXTRACTION PHACO AND  INTRAOCULAR LENS PLACEMENT (IOC) RIGHT 2.54 00:22.1;  Surgeon: Rosa College, MD;  Location: [facility];  Service: Ophthalmology;  Laterality: Right;   CATARACT EXTRACTION W/PHACO Left 02/12/2023   Procedure: CATARACT EXTRACTION PHACO AND INTRAOCULAR LENS PLACEMENT (IOC) LEFT 4.09 00:29.3;  Surgeon: Rosa College, MD;  Location: [facility];  Service: Ophthalmology;  Laterality: Left;   CHOLECYSTECTOMY  06-01-03   COLONOSCOPY  2003   EXTRACORPOREAL SHOCK WAVE LITHOTRIPSY Left 06/16/2021   Procedure: EXTRACORPOREAL SHOCK WAVE LITHOTRIPSY (ESWL);  Surgeon: Dustin Gimenez, MD;  Location: [facility] ORS;  Service: Urology;  Laterality: Left;   LITHOTRIPSY  2005   local exicsion skin cancer  2004   squamous cell of chest wall. left chest   OOPHORECTOMY     TONSILLECTOMY  1971   TUBAL LIGATION  1975   VAGINAL PROLAPSE REPAIR  July 2001   Pelvic Prolapse    FAMILY HISTORY :   Family History  Problem Relation Age of Onset   Stroke Mother    Breast cancer Mother 20   Stroke Father    Dementia Father    Cancer Brother        bladder   Stroke Brother    Heart attack Brother    Breast cancer Paternal Aunt 34   Bipolar disorder Son     SOCIAL HISTORY:   Social History   Tobacco Use   Smoking status: Never   Smokeless tobacco: Never  Vaping Use   Vaping status: Never Used  Substance Use Topics   Alcohol use: No   Drug use: No    ALLERGIES:  has no known allergies.  MEDICATIONS:  Current Outpatient Medications  Medication Sig Dispense Refill   albuterol (VENTOLIN HFA) 108 (90 Base) MCG/ACT inhaler INHALE 1 PUFFS INTO THE LUNGS EVERY 6 HOURS AS NEEDED FOR WHEEZING OR SHORTNESS OF BREATH 18 g 2   amiodarone (PACERONE) 200 MG tablet Take 1 tablet (200 mg total) by mouth daily. 90 tablet 0   amLODipine (NORVASC) 5 MG tablet TAKE 1 TABLET BY MOUTH ONCE DAILY 90 tablet 1   apixaban (ELIQUIS) 2.5 MG TABS tablet Take 1 tablet (2.5 mg total) by mouth 2 (two) times  daily. 180 tablet 1   Cholecalciferol (VITAMIN D3) 1000 units CAPS Take 1,000 Units by mouth daily.     Co-Enzyme Q10 200 MG CAPS Take 1 tablet by mouth. Taking 1 every other day     losartan (COZAAR) 100 MG tablet Take 1 tablet (100 mg total) by mouth daily. 90 tablet 1   oseltamivir (TAMIFLU) 75 MG capsule Take 1 capsule (75 mg total) by mouth 2 (two) times daily. 10 capsule 0   pantoprazole (PROTONIX) 40 MG tablet Take 40 mg by mouth daily.     potassium chloride SA (KLOR-CON M) 20 MEQ tablet TAKE 1 TABLET BY MOUTH TWICE DAILY 60 tablet 3   rosuvastatin (CRESTOR) 20 MG tablet Take 1 tablet (20 mg total) by mouth at bedtime. 90 tablet 3   No current facility-administered medications for this visit.    PHYSICAL EXAMINATION:  ECOG PERFORMANCE STATUS: 0 - Asymptomatic  There were no vitals taken for this visit.  There were no vitals filed for this visit.    Physical Exam HENT:     Head: Normocephalic and atraumatic.     Mouth/Throat:     Pharynx: No oropharyngeal exudate.  Eyes:     Pupils: Pupils are equal, round, and reactive to light.  Cardiovascular:     Rate and Rhythm: Normal rate. Rhythm irregular.  Pulmonary:     Effort: Pulmonary effort is normal. No respiratory distress.     Breath sounds: Normal breath sounds. No wheezing.  Abdominal:     General: Bowel sounds are normal. There is no distension.     Palpations: Abdomen is soft. There is no mass.     Tenderness: There is no abdominal tenderness. There is no guarding or rebound.  Musculoskeletal:        General: No tenderness. Normal range of motion.     Cervical back: Normal range of motion and neck supple.  Skin:    General: Skin is warm.  Neurological:     Mental Status: She is alert and oriented to person, place, and time.  Psychiatric:        Mood and Affect: Affect normal.     LABORATORY DATA:  I have reviewed the data as listed    Component Value Date/Time   NA 135 08/29/2023 1008   NA 142  01/17/2023 1407   NA 138 09/10/2013 0758   K 4.3 08/29/2023 1008   K 3.3 (L) 09/10/2013 0758   CL 103 08/29/2023 1008   CL 100 09/10/2013 0758   CO2 26 08/29/2023 1008   CO2 32 09/10/2013 0758   GLUCOSE 82 08/29/2023 1008   GLUCOSE 85 09/10/2013 0758   BUN 22 08/29/2023 1008   BUN 19 01/17/2023 1407   BUN 13 09/10/2013 0758   CREATININE 0.78 08/29/2023 1008   CREATININE 0.96 12/22/2013 0857   CALCIUM 8.9 08/29/2023 1008   CALCIUM 9.2 09/10/2013 0758   PROT 7.2 08/29/2023 1008   PROT 6.8 02/15/2023 1100   PROT 8.3 (H) 09/10/2013 0758   ALBUMIN 4.0 08/29/2023 1008   ALBUMIN 4.3 02/15/2023 1100   ALBUMIN 3.5 09/10/2013 0758   AST 78 (H) 08/29/2023 1008   ALT 96 (H) 08/29/2023 1008   ALT 28 09/10/2013 0758   ALKPHOS 95 08/29/2023 1008   ALKPHOS 78 09/10/2013 0758   BILITOT 0.9 08/29/2023 1008   GFRNONAA >60 08/29/2023 1008   GFRNONAA 59 (L) 12/22/2013 0857   GFRAA >60 01/21/2020 1309   GFRAA 68 12/22/2013 0857    No results found for: "SPEP", "UPEP"  Lab Results  Component Value Date   WBC 5.6 08/29/2023   NEUTROABS 3.9 08/29/2023   HGB 12.0 08/29/2023   HCT 36.1 08/29/2023   MCV 97.8 08/29/2023   PLT 202 08/29/2023      Chemistry      Component Value Date/Time   NA 135 08/29/2023 1008   NA 142 01/17/2023 1407   NA 138 09/10/2013 0758   K 4.3 08/29/2023 1008   K 3.3 (L) 09/10/2013 0758   CL 103 08/29/2023 1008   CL 100 09/10/2013 0758   CO2 26 08/29/2023 1008   CO2 32 09/10/2013 0758   BUN 22 08/29/2023 1008   BUN 19 01/17/2023 1407   BUN 13 09/10/2013 0758   CREATININE 0.78 08/29/2023 1008   CREATININE 0.96 12/22/2013 0857      Component Value Date/Time  CALCIUM 8.9 08/29/2023 1008   CALCIUM 9.2 09/10/2013 0758   ALKPHOS 95 08/29/2023 1008   ALKPHOS 78 09/10/2013 0758   AST 78 (H) 08/29/2023 1008   ALT 96 (H) 08/29/2023 1008   ALT 28 09/10/2013 0758   BILITOT 0.9 08/29/2023 1008       RADIOGRAPHIC STUDIES: I have personally reviewed the  radiological images as listed and agreed with the findings in the report. No results found.   ASSESSMENT & PLAN:  Follicular lymphoma grade ii, intra-abdominal lymph nodes (HCC) # RETROPERITONEAL LN [incidental/asymptomatic]- follicular lymphoma grade 2; likely stage II; status post rituximab. FEB 2019 PET scan NED- Clinically no evidence recurrence. will get scans on clinical basis-  stable.  # hypokalemia- 3.3 ? Laxatives/causing diarrhea. continue Kdur 20 meq - once a day Dr.Tullo/PCP.   # Elevated LFTs- G-1- monitor for now. - [FEB 2025- elevated sec to Flu]-Improved  # CKD- III-GFR 53-stable  [s/p Dr.Kolluru nephrology]   # A.fib- on eliquis/ amiodarone [Dr.Etang]- 2.5 mg stable.   # Tremors-benign versus others- off levbid- ? Memory issues-defer to PCP- stable.   # Hypocalcemia- ca- 8.7; on  ca+vit D- stable.   # DISPOSITION: # follow up in 6 months-MD/ labs-cbc/cmp,ldh-Dr.B  Cc: Dr.Tullo    No orders of the defined types were placed in this encounter.  All questions were answered. The patient knows to call the clinic with any problems, questions or concerns.      Gwyn Leos, MD 08/29/2023 11:13 AM

## 2023-08-29 NOTE — Assessment & Plan Note (Addendum)
#   RETROPERITONEAL LN [incidental/asymptomatic]- follicular lymphoma grade 2; likely stage II; status post rituximab. FEB 2019 PET scan NED- Clinically no evidence recurrence. will get scans on clinical basis-  stable.  # hypokalemia- 3.3 ? Laxatives/causing diarrhea. continue Kdur 20 meq - once a day Dr.Tullo/PCP.   # Elevated LFTs- G-1- monitor for now. - [FEB 2025- elevated sec to Flu]-Improved  # CKD- III-GFR 53-stable  [s/p Dr.Kolluru nephrology]   # A.fib- on eliquis/ amiodarone [Dr.Etang]- 2.5 mg stable.   # Tremors-benign versus others- off levbid- ? Memory issues-defer to PCP- stable.   # Hypocalcemia- ca- 8.7; on  ca+vit D- stable.   # DISPOSITION: # follow up in 6 months-MD/ labs-cbc/cmp,ldh-Dr.B  Cc: Dr.Tullo

## 2023-08-29 NOTE — Addendum Note (Signed)
 Addended by: Vivi Grit on: 08/29/2023 11:34 AM   Modules accepted: Orders

## 2023-09-20 DIAGNOSIS — L239 Allergic contact dermatitis, unspecified cause: Secondary | ICD-10-CM | POA: Diagnosis not present

## 2023-09-20 DIAGNOSIS — H1013 Acute atopic conjunctivitis, bilateral: Secondary | ICD-10-CM | POA: Diagnosis not present

## 2023-10-15 ENCOUNTER — Other Ambulatory Visit: Payer: Self-pay | Admitting: Medical

## 2023-10-15 NOTE — Telephone Encounter (Signed)
 Prescription refill request for Eliquis  received. Indication:afib Last office visit:3/25 Scr:0.78  4/25 Age: 83 Weight:51.3  kg  Prescription refilled

## 2023-10-16 ENCOUNTER — Other Ambulatory Visit: Payer: Self-pay | Admitting: *Deleted

## 2023-10-16 MED ORDER — AMIODARONE HCL 200 MG PO TABS
200.0000 mg | ORAL_TABLET | Freq: Every day | ORAL | 3 refills | Status: AC
Start: 2023-10-16 — End: ?

## 2023-10-29 ENCOUNTER — Other Ambulatory Visit: Payer: Self-pay | Admitting: Internal Medicine

## 2023-10-29 DIAGNOSIS — Z1231 Encounter for screening mammogram for malignant neoplasm of breast: Secondary | ICD-10-CM

## 2023-11-26 ENCOUNTER — Other Ambulatory Visit: Payer: Self-pay | Admitting: Internal Medicine

## 2023-11-30 ENCOUNTER — Ambulatory Visit
Admission: RE | Admit: 2023-11-30 | Discharge: 2023-11-30 | Disposition: A | Discharge status: Home / Self Care | Source: Ambulatory Visit | Attending: Internal Medicine | Admitting: Internal Medicine

## 2023-11-30 DIAGNOSIS — Z1231 Encounter for screening mammogram for malignant neoplasm of breast: Secondary | ICD-10-CM | POA: Diagnosis not present

## 2023-12-04 ENCOUNTER — Other Ambulatory Visit: Payer: Self-pay | Admitting: Internal Medicine

## 2023-12-04 DIAGNOSIS — R928 Other abnormal and inconclusive findings on diagnostic imaging of breast: Secondary | ICD-10-CM

## 2023-12-05 ENCOUNTER — Ambulatory Visit: Payer: Self-pay | Admitting: Internal Medicine

## 2023-12-11 ENCOUNTER — Ambulatory Visit
Admission: RE | Admit: 2023-12-11 | Discharge: 2023-12-11 | Disposition: A | Discharge status: Home / Self Care | Source: Ambulatory Visit | Attending: Internal Medicine | Admitting: Internal Medicine

## 2023-12-11 DIAGNOSIS — R928 Other abnormal and inconclusive findings on diagnostic imaging of breast: Secondary | ICD-10-CM | POA: Insufficient documentation

## 2023-12-11 DIAGNOSIS — R92333 Mammographic heterogeneous density, bilateral breasts: Secondary | ICD-10-CM | POA: Diagnosis not present

## 2023-12-13 ENCOUNTER — Ambulatory Visit: Admitting: Internal Medicine

## 2023-12-13 ENCOUNTER — Encounter: Payer: Self-pay | Admitting: Internal Medicine

## 2023-12-13 VITALS — BP 120/62 | HR 54 | Temp 97.5°F | Ht 62.0 in | Wt 112.2 lb

## 2023-12-13 DIAGNOSIS — E785 Hyperlipidemia, unspecified: Secondary | ICD-10-CM

## 2023-12-13 DIAGNOSIS — Z789 Other specified health status: Secondary | ICD-10-CM

## 2023-12-13 DIAGNOSIS — R7303 Prediabetes: Secondary | ICD-10-CM | POA: Diagnosis not present

## 2023-12-13 DIAGNOSIS — G3184 Mild cognitive impairment, so stated: Secondary | ICD-10-CM

## 2023-12-13 DIAGNOSIS — D126 Benign neoplasm of colon, unspecified: Secondary | ICD-10-CM

## 2023-12-13 DIAGNOSIS — Z0000 Encounter for general adult medical examination without abnormal findings: Secondary | ICD-10-CM

## 2023-12-13 DIAGNOSIS — Z79899 Other long term (current) drug therapy: Secondary | ICD-10-CM | POA: Diagnosis not present

## 2023-12-13 DIAGNOSIS — I129 Hypertensive chronic kidney disease with stage 1 through stage 4 chronic kidney disease, or unspecified chronic kidney disease: Secondary | ICD-10-CM

## 2023-12-13 DIAGNOSIS — C8213 Follicular lymphoma grade II, intra-abdominal lymph nodes: Secondary | ICD-10-CM

## 2023-12-13 DIAGNOSIS — R5383 Other fatigue: Secondary | ICD-10-CM

## 2023-12-13 LAB — LIPID PANEL
Cholesterol: 131 mg/dL (ref 0–200)
HDL: 41.6 mg/dL (ref >39.00)
LDL Cholesterol: 71 mg/dL (ref 0–99)
NonHDL: 89.83
Total CHOL/HDL Ratio: 3
Triglycerides: 95 mg/dL (ref 0.0–149.0)
VLDL: 19 mg/dL (ref 0.0–40.0)

## 2023-12-13 LAB — MICROALBUMIN / CREATININE URINE RATIO
Creatinine,U: 220.3 mg/dL
Microalb Creat Ratio: 18.4 mg/g (ref 0.0–30.0)
Microalb, Ur: 4.1 mg/dL — ABNORMAL HIGH (ref 0.0–1.9)

## 2023-12-13 LAB — COMPREHENSIVE METABOLIC PANEL WITH GFR
ALT: 108 U/L — ABNORMAL HIGH (ref 0–35)
AST: 109 U/L — ABNORMAL HIGH (ref 0–37)
Albumin: 4.1 g/dL (ref 3.5–5.2)
Alkaline Phosphatase: 134 U/L — ABNORMAL HIGH (ref 39–117)
BUN: 17 mg/dL (ref 6–23)
CO2: 30 meq/L (ref 19–32)
Calcium: 8.9 mg/dL (ref 8.4–10.5)
Chloride: 102 meq/L (ref 96–112)
Creatinine, Ser: 0.91 mg/dL (ref 0.40–1.20)
GFR: 58.81 mL/min — ABNORMAL LOW (ref >60.00)
Glucose, Bld: 80 mg/dL (ref 70–99)
Potassium: 3.9 meq/L (ref 3.5–5.1)
Sodium: 138 meq/L (ref 135–145)
Total Bilirubin: 0.7 mg/dL (ref 0.2–1.2)
Total Protein: 7.5 g/dL (ref 6.0–8.3)

## 2023-12-13 LAB — HEMOGLOBIN A1C: Hgb A1c MFr Bld: 5.9 % (ref 4.6–6.5)

## 2023-12-13 LAB — LDL CHOLESTEROL, DIRECT: Direct LDL: 72 mg/dL

## 2023-12-13 LAB — TSH: TSH: 0.83 u[IU]/mL (ref 0.35–5.50)

## 2023-12-13 MED ORDER — AMLODIPINE BESYLATE 5 MG PO TABS: 5.0000 mg | ORAL_TABLET | Freq: Every day | ORAL | 1 refills | Status: AC

## 2023-12-13 MED ORDER — LOSARTAN POTASSIUM 100 MG PO TABS: 100.0000 mg | ORAL_TABLET | Freq: Every day | ORAL | 1 refills | Status: DC

## 2023-12-13 NOTE — Assessment & Plan Note (Addendum)
 Managed by [facility] [place]

## 2023-12-13 NOTE — Assessment & Plan Note (Addendum)
 Prior MMSE was 21/30 and MRI noted vascular changes  (4 prior CVAs)  She was referred to Dr Maree in July 2024 but did not return their calls so the referral was cancelled .  She declines further workup at this time and reports that her memory has improved.

## 2023-12-13 NOTE — Progress Notes (Unsigned)
 Patient ID: Trula DELENA Northern, female    DOB: 03-21-1941  Age: 83 y.o. MRN: [id number]  The patient is here for annual preventive  examination and management of other chronic and acute problems.   The risk factors are reflected in the social history.  The roster of all physicians providing medical care to patient - is listed in the Snapshot section of the chart.  Activities of daily living:  The patient is 100% independent in all ADLs: dressing, toileting, feeding as well as independent mobility  Home safety : The patient has smoke detectors in the home. They wear seatbelts.  There are no firearms at home. There is no violence in the home.   There is no risks for hepatitis, STDs or HIV. There is no   history of blood transfusion. They have no travel history to infectious disease endemic areas of the world.  The patient has seen their dentist in the last six month. They have seen their eye doctor in the last year. They admit to slight hearing difficulty with regard to whispered voices and some television programs.  They have deferred audiologic testing in the last year.  They do not  have excessive sun exposure. Discussed the need for sun protection: hats, long sleeves and use of sunscreen if there is significant sun exposure.   Diet: the importance of a healthy diet is discussed. They do have a healthy diet.  The benefits of regular aerobic exercise were discussed. She walks 4 times per week ,  20 minutes.   Depression screen: there are no signs or vegative symptoms of depression- irritability, change in appetite, anhedonia, sadness/tearfullness.  Cognitive assessment: the patient manages all their financial and personal affairs and is actively engaged. They could relate day,date,year and events; recalled 2/3 objects at 3 minutes; performed clock-face test normally.  The following portions of the patient's history were reviewed and updated as appropriate: allergies, current medications,  past family history, past medical history,  past surgical history, past social history  and problem list.  Visual acuity was not assessed per patient preference since she has regular follow up with her ophthalmologist. Hearing and body mass index were assessed and reviewed.   During the course of the visit the patient was educated and counseled about appropriate screening and preventive services including : fall prevention , diabetes screening, nutrition counseling, colorectal cancer screening, and recommended immunizations.    CC: The primary encounter diagnosis was Preventative health care. Diagnoses of Renal hypertension, Hyperlipidemia LDL goal <130, Prediabetes, Other fatigue, Long-term current use of high risk medication other than anticoagulant, Statin intolerance, Mild cognitive impairment, Follicular lymphoma grade II of intra-abdominal lymph nodes (HCC), and Tubular adenoma of colon were also pertinent to this visit.  Follicular lymphoma grade ii, intra-abdominal lymph nodes (HCC) # RETROPERITONEAL LN [incidental/asymptomatic]- follicular lymphoma grade 2; likely stage II; status post rituximab . FEB 2019 PET scan NED- Clinically no evidence recurrence. will get scans on clinical basis-  stable.   History [place] has a past medical history of Atrial fibrillation (HCC), Basal cell carcinoma of nose, Bronchitis, Follicular lymphoma (HCC), GERD (gastroesophageal reflux disease), Grade II diastolic dysfunction, Heart murmur, Hemorrhoids, History of basal cell carcinoma, History of colon polyps, History of dysplastic nevus (12/05/2001), History of kidney stones, History of mammogram (2016), History of squamous cell carcinoma (02/07/2005), Hyperlipidemia, Hypertension, Mild aortic regurgitation, Mild cognitive impairment, Mild dementia with anxiety (HCC), Mild tricuspid regurgitation by prior echocardiogram, Moderate mitral regurgitation by prior echocardiogram, Non Hodgkin's lymphoma (HCC), Non  Hodgkin's lymphoma (HCC), and 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   She has a past surgical history that includes Tonsillectomy (1971); Tubal ligation (1975); Abdominal hysterectomy (1984); Vaginal prolapse repair (July 2001); Cholecystectomy (06-01-03); Lithotripsy (2005); Bladder suspension (2010); local exicsion skin cancer (2004); Colonoscopy (2003); Oophorectomy; Cardioversion (N/A, 11/30/2020); Extracorporeal shock wave lithotripsy (Left, 06/16/2021); Cataract extraction w/PHACO (Right, 01/29/2023); and Cataract extraction w/PHACO (Left, 02/12/2023).   Her family history includes Bipolar disorder in her son; Breast cancer (age of onset: 41) in her paternal aunt; Breast cancer (age of onset: 51) in her mother; Cancer in her brother; Dementia in her father; Heart attack in her brother; Stroke in her brother, father, and mother.She reports that she has never smoked. She has never used smokeless tobacco. She reports that she does not drink alcohol and does not use drugs.  Outpatient Medications Prior to Visit  Medication Sig Dispense Refill   albuterol  (VENTOLIN  HFA) 108 (90 Base) MCG/ACT inhaler INHALE 1 PUFFS INTO THE LUNGS EVERY 6 HOURS AS NEEDED FOR WHEEZING OR SHORTNESS OF BREATH 18 g 2   amiodarone  (PACERONE ) 200 MG tablet Take 1 tablet (200 mg total) by mouth daily. 90 tablet 3   ELIQUIS  2.5 MG TABS tablet TAKE 1 TABLET BY MOUTH TWICE DAILY 180 tablet 1   pantoprazole  (PROTONIX ) 40 MG tablet Take 40 mg by mouth daily.     potassium chloride  SA (KLOR-CON  M) 20 MEQ tablet TAKE 1 TABLET BY MOUTH TWICE DAILY 60 tablet 3   rosuvastatin  (CRESTOR ) 20 MG tablet Take 1 tablet (20 mg total) by mouth at bedtime. 90 tablet 3   amLODipine  (NORVASC ) 5 MG tablet TAKE 1 TABLET BY MOUTH ONCE DAILY 90 tablet 1   losartan  (COZAAR ) 100 MG tablet TAKE 1 TABLET BY MOUTH ONCE DAILY 30 tablet 0   Cholecalciferol  (VITAMIN D3) 1000 units CAPS Take 1,000 Units by mouth daily. (Patient not taking:  Reported on 12/13/2023)     Co-Enzyme Q10 200 MG CAPS Take 1 tablet by mouth. Taking 1 every other day (Patient not taking: Reported on 12/13/2023)     oseltamivir  (TAMIFLU ) 75 MG capsule Take 1 capsule (75 mg total) by mouth 2 (two) times daily. (Patient not taking: Reported on 12/13/2023) 10 capsule 0   No facility-administered medications prior to visit.    Review of Systems  Patient denies headache, fevers, malaise, unintentional weight loss, skin rash, eye pain, sinus congestion and sinus pain, sore throat, dysphagia,  hemoptysis , cough, dyspnea, wheezing, chest pain, palpitations, orthopnea, edema, abdominal pain, nausea, melena, diarrhea, constipation, flank pain, dysuria, hematuria, urinary  Frequency, nocturia, numbness, tingling, seizures,  Focal weakness, Loss of consciousness,  Tremor, insomnia, depression, anxiety, and suicidal ideation.     Objective:  BP 120/62 (BP Location: Left Arm, Patient Position: Sitting, Cuff Size: Normal)   Pulse (!) 54   Temp (!) 97.5 F (36.4 C) (Oral)   Ht 5' 2 (1.575 m)   Wt 112 lb 3.2 oz (50.9 kg)   SpO2 97%   BMI 20.52 kg/m   Physical Exam Vitals reviewed.  Constitutional:      General: She is not in acute distress.    Appearance: Normal appearance. She is normal weight. She is not ill-appearing, toxic-appearing or diaphoretic.  HENT:     Head: Normocephalic.  Eyes:     General: No scleral icterus.       Right eye: No discharge.        Left eye: No discharge.  Conjunctiva/sclera: Conjunctivae normal.  Cardiovascular:     Rate and Rhythm: Normal rate and regular rhythm.     Heart sounds: Normal heart sounds.  Pulmonary:     Effort: Pulmonary effort is normal. No respiratory distress.     Breath sounds: Normal breath sounds.  Musculoskeletal:        General: Normal range of motion.  Skin:    General: Skin is warm and dry.  Neurological:     General: No focal deficit present.     Mental Status: She is alert and oriented to  person, place, and time. Mental status is at baseline.  Psychiatric:        Mood and Affect: Mood normal.        Behavior: Behavior normal.        Thought Content: Thought content normal.        Judgment: Judgment normal.     Assessment & Plan:  Preventative health care Assessment & Plan: age appropriate education and counseling updated, referrals for preventative services and immunizations addressed, dietary and smoking counseling addressed, most recent labs reviewed.  I have personally reviewed and have noted:   1) the patient's medical and social history 2) The pt's use of alcohol, tobacco, and illicit drugs 3) The patient's current medications and supplements 4) Functional ability including ADL's, fall risk, home safety risk, hearing and visual impairment 5) Diet and physical activities 6) Evidence for depression or mood disorder 7) The patient's height, weight, and BMI have been recorded in the chart 8) MAMMOGRAM REVIEWED    I have REVIEWED PREVIOUS REFERRALS , and provided counseling and education based on review of the above    Renal hypertension Assessment & Plan: Well controlled on current regimen of amlodipine  and losartan  . Renal function stable, no changes today.   Orders: -     amLODIPine  Besylate; Take 1 tablet (5 mg total) by mouth daily.  Dispense: 90 tablet; Refill: 1 -     Losartan  Potassium; Take 1 tablet (100 mg total) by mouth daily.  Dispense: 90 tablet; Refill: 1 -     Comprehensive metabolic panel with GFR -     Microalbumin / creatinine urine ratio  Hyperlipidemia LDL goal <130 -     Lipid panel -     LDL cholesterol, direct  Prediabetes -     Hemoglobin A1c  Other fatigue  Long-term current use of high risk medication other than anticoagulant Assessment & Plan: Checking thyroid  function today due to longterm use of amiodarone  for PAF  Orders: -     TSH  Statin intolerance Assessment & Plan: She is tolerating rosuvastatin     Mild  cognitive impairment Assessment & Plan: Prior MMSE was 21/30 and MRI noted vascular changes  (4 prior CVAs)  She was referred to Dr Maree in July 2024 but did not return their calls so the referral was cancelled .  She declines further workup at this time and reports that her memory has improved.     Follicular lymphoma grade II of intra-abdominal lymph nodes [facility]) Assessment & Plan: Managed by [facility] [place]   Tubular adenoma of colon Assessment & Plan: She was referred to GI and offered colonoscopy in May 2024  and DECLINED        I provided 40 minutes of  face-to-face time during this encounter reviewing patient's current problems and past surgeries,  recent labs and imaging studies, providing counseling on the above mentioned problems , and coordination  of care .  Follow-up: Return in about 6 months (around 06/14/2024) for hypertension.   Verneita LITTIE Kettering, MD

## 2023-12-13 NOTE — Assessment & Plan Note (Signed)
 Checking thyroid  function today due to longterm use of amiodarone  for PAF

## 2023-12-13 NOTE — Assessment & Plan Note (Signed)
Well controlled on current regimen of amlodipine and losartan. Renal function stable, no changes today.

## 2023-12-13 NOTE — Assessment & Plan Note (Signed)
 She was referred to GI and offered colonoscopy in May 2024  and DECLINED

## 2023-12-13 NOTE — Assessment & Plan Note (Signed)
She is tolerating rosuvastatin

## 2023-12-13 NOTE — Assessment & Plan Note (Addendum)
 age appropriate education and counseling updated, referrals for preventative services and immunizations addressed, dietary and smoking counseling addressed, most recent labs reviewed.  I have personally reviewed and have noted:   1) the patient's medical and social history 2) The pt's use of alcohol, tobacco, and illicit drugs 3) The patient's current medications and supplements 4) Functional ability including ADL's, fall risk, home safety risk, hearing and visual impairment 5) Diet and physical activities 6) Evidence for depression or mood disorder 7) The patient's height, weight, and BMI have been recorded in the chart 8) MAMMOGRAM REVIEWED    I have REVIEWED PREVIOUS REFERRALS , and provided counseling and education based on review of the above

## 2023-12-15 ENCOUNTER — Ambulatory Visit: Payer: Self-pay | Admitting: Internal Medicine

## 2023-12-15 ENCOUNTER — Encounter: Payer: Self-pay | Admitting: Internal Medicine

## 2023-12-15 DIAGNOSIS — C8213 Follicular lymphoma grade II, intra-abdominal lymph nodes: Secondary | ICD-10-CM

## 2023-12-15 DIAGNOSIS — R748 Abnormal levels of other serum enzymes: Secondary | ICD-10-CM

## 2023-12-15 DIAGNOSIS — Z9049 Acquired absence of other specified parts of digestive tract: Secondary | ICD-10-CM | POA: Insufficient documentation

## 2023-12-20 ENCOUNTER — Telehealth: Payer: Self-pay | Admitting: Internal Medicine

## 2023-12-20 DIAGNOSIS — G3184 Mild cognitive impairment, so stated: Secondary | ICD-10-CM

## 2023-12-20 NOTE — Telephone Encounter (Signed)
 LMTCB

## 2023-12-20 NOTE — Telephone Encounter (Signed)
 Copied from CRM [id number]. Topic: Referral - Question >> Dec 20, 2023 11:45 AM Drema MATSU wrote: Reason for CRM: Patient stated that Dr. Marylynn advised that she sees a Dr. Waddell because she forget what she going to say at times. She is requesting a callback to clarify what she should do.

## 2023-12-25 NOTE — Telephone Encounter (Unsigned)
 Copied from CRM [id number]. Topic: General - Other >> Dec 25, 2023  4:01 PM Delon DASEN wrote: Reason for CRM: patient returning call from office-  [phone]

## 2023-12-26 NOTE — Addendum Note (Signed)
 Addended by: MARYLYNN VERNEITA CROME on: 12/26/2023 04:43 PM   Modules accepted: Orders

## 2023-12-26 NOTE — Telephone Encounter (Signed)
 Pt stated that she would like to know go forward with the referral.

## 2023-12-26 NOTE — Telephone Encounter (Signed)
 Spoke with pt and she stated that she has not heard from Dr. Adrian office about scheduling an appt. I let pt know that  I would check into this for her and give her a call back. I do not see that a referral was placed for Dr. Adrian office.

## 2023-12-26 NOTE — Telephone Encounter (Unsigned)
 Copied from CRM #[id number]. Topic: General - Other >> Dec 26, 2023 11:52 AM Revonda D wrote: Reason for CRM: Patient stated that Dr. Marylynn advised that she sees a Dr. Waddell because she forget what she going to say at times. She is requesting a callback to clarify what she should do.  UPDATE: Pt is calling back to get the correct phone number for Dr.Taylor so she can schedule an appt. Pt would like a callback today.

## 2023-12-27 ENCOUNTER — Telehealth: Payer: Self-pay

## 2023-12-27 NOTE — Telephone Encounter (Signed)
-----   Message from Whitney Molina sent at 12/18/2023 11:27 PM EDT ----- Regarding: RE: elevated liver enzymes I am doing well, Hope you are. Thank you for reaching out-I think an ultrasound would be reasonable to start.  If no explanation noted for the elevated LFTs-a CT scan would be reasonable to check for any worsening abdominal adenopathy etc.  GB ----- Message ----- From: Molina Whitney CROME, MD Sent: 12/15/2023   9:02 AM EDT To: Whitney JONELLE Joe, MD Subject: elevated liver enzymes                         Good morning , I hop you are well. FYI.  Whitney Molina's liver enzymes never  normalized after her influenza bout in February and they are 3x normal currently..  if she is not due  for surveillance imaging for retroperitoneal follicular lymphoma,  I will order an abdominal ultrasound ; let me know what you'd like me to do.   Regards,   Whitney Marylynn, MD

## 2023-12-27 NOTE — Telephone Encounter (Signed)
 LMTCB

## 2023-12-27 NOTE — Telephone Encounter (Signed)
 LMTCB. Please let pt know Dr. Lula message below.

## 2023-12-27 NOTE — Telephone Encounter (Signed)
-----   Message from Verneita LITTIE Kettering sent at 12/27/2023  7:42 AM EDT ----- Regarding: ultasound of liver needed Dr Rennie and I have spoken about her elevated liver enzymes and he would like her to have an ultasound of her liver which I have ordered

## 2023-12-31 NOTE — Telephone Encounter (Signed)
 Pt is aware and gave a verbal understanding.

## 2024-01-04 ENCOUNTER — Other Ambulatory Visit: Payer: Self-pay | Admitting: Internal Medicine

## 2024-01-08 ENCOUNTER — Ambulatory Visit
Admission: RE | Admit: 2024-01-08 | Discharge: 2024-01-08 | Disposition: A | Discharge status: Home / Self Care | Source: Ambulatory Visit | Attending: Internal Medicine | Admitting: Internal Medicine

## 2024-01-08 DIAGNOSIS — Z9049 Acquired absence of other specified parts of digestive tract: Secondary | ICD-10-CM | POA: Diagnosis not present

## 2024-01-08 DIAGNOSIS — R748 Abnormal levels of other serum enzymes: Secondary | ICD-10-CM | POA: Insufficient documentation

## 2024-01-08 DIAGNOSIS — C8213 Follicular lymphoma grade II, intra-abdominal lymph nodes: Secondary | ICD-10-CM | POA: Diagnosis not present

## 2024-01-12 ENCOUNTER — Ambulatory Visit: Payer: Self-pay | Admitting: Internal Medicine

## 2024-01-28 ENCOUNTER — Telehealth: Payer: Self-pay | Admitting: Internal Medicine

## 2024-01-28 NOTE — Telephone Encounter (Signed)
 Called patient to reschedule upcoming appointment dur to provider being out of office. No answer left voicemail requesting a call back.

## 2024-02-28 ENCOUNTER — Other Ambulatory Visit

## 2024-02-28 ENCOUNTER — Ambulatory Visit: Admitting: Internal Medicine

## 2024-03-03 ENCOUNTER — Other Ambulatory Visit: Payer: Self-pay | Admitting: Medical

## 2024-03-03 ENCOUNTER — Other Ambulatory Visit: Payer: Self-pay | Admitting: Internal Medicine

## 2024-03-03 DIAGNOSIS — I129 Hypertensive chronic kidney disease with stage 1 through stage 4 chronic kidney disease, or unspecified chronic kidney disease: Secondary | ICD-10-CM

## 2024-03-21 ENCOUNTER — Other Ambulatory Visit: Payer: Self-pay | Admitting: Medical

## 2024-03-21 DIAGNOSIS — I480 Paroxysmal atrial fibrillation: Secondary | ICD-10-CM

## 2024-03-21 NOTE — Telephone Encounter (Signed)
 Eliquis  2.5mg  refill request received. Patient is 83 years old, weight-50.9kg, Crea-0.91 on 12/13/23, Diagnosis-Afib, and last seen by Dr. Budd Kindle on 08/10/23. Dose is appropriate based on dosing criteria. Will send in refill to requested pharmacy.

## 2024-03-26 ENCOUNTER — Inpatient Hospital Stay: Admitting: Internal Medicine

## 2024-03-26 ENCOUNTER — Inpatient Hospital Stay: Attending: Internal Medicine

## 2024-03-26 ENCOUNTER — Inpatient Hospital Stay

## 2024-03-26 ENCOUNTER — Encounter: Payer: Self-pay | Admitting: Internal Medicine

## 2024-03-26 VITALS — BP 147/71 | HR 56 | Temp 97.1°F | Resp 20 | Ht 62.0 in | Wt 112.5 lb

## 2024-03-26 DIAGNOSIS — C8213 Follicular lymphoma grade II, intra-abdominal lymph nodes: Secondary | ICD-10-CM

## 2024-03-26 DIAGNOSIS — R7989 Other specified abnormal findings of blood chemistry: Secondary | ICD-10-CM | POA: Diagnosis not present

## 2024-03-26 LAB — CMP (CANCER CENTER ONLY)
ALT: 90 U/L — ABNORMAL HIGH (ref 0–44)
AST: 104 U/L — ABNORMAL HIGH (ref 15–41)
Albumin: 3.7 g/dL (ref 3.5–5.0)
Alkaline Phosphatase: 237 U/L — ABNORMAL HIGH (ref 38–126)
Anion gap: 8 (ref 5–15)
BUN: 17 mg/dL (ref 8–23)
CO2: 25 mmol/L (ref 22–32)
Calcium: 8.7 mg/dL — ABNORMAL LOW (ref 8.9–10.3)
Chloride: 101 mmol/L (ref 98–111)
Creatinine: 0.84 mg/dL (ref 0.44–1.00)
GFR, Estimated: >60 mL/min (ref >60)
Glucose, Bld: 77 mg/dL (ref 70–99)
Potassium: 3.5 mmol/L (ref 3.5–5.1)
Sodium: 134 mmol/L — ABNORMAL LOW (ref 135–145)
Total Bilirubin: 0.6 mg/dL (ref 0.0–1.2)
Total Protein: 7.5 g/dL (ref 6.5–8.1)

## 2024-03-26 LAB — CBC WITH DIFFERENTIAL (CANCER CENTER ONLY)
Abs Immature Granulocytes: 0.04 K/uL (ref 0.00–0.07)
Basophils Absolute: 0.1 K/uL (ref 0.0–0.1)
Basophils Relative: 1 %
Eosinophils Absolute: 0.1 K/uL (ref 0.0–0.5)
Eosinophils Relative: 1 %
HCT: 35.7 % — ABNORMAL LOW (ref 36.0–46.0)
Hemoglobin: 12 g/dL (ref 12.0–15.0)
Immature Granulocytes: 1 %
Lymphocytes Relative: 18 %
Lymphs Abs: 1.5 K/uL (ref 0.7–4.0)
MCH: 32.3 pg (ref 26.0–34.0)
MCHC: 33.6 g/dL (ref 30.0–36.0)
MCV: 96.2 fL (ref 80.0–100.0)
Monocytes Absolute: 0.8 K/uL (ref 0.1–1.0)
Monocytes Relative: 9 %
Neutro Abs: 6 K/uL (ref 1.7–7.7)
Neutrophils Relative %: 70 %
Platelet Count: 211 K/uL (ref 150–400)
RBC: 3.71 MIL/uL — ABNORMAL LOW (ref 3.87–5.11)
RDW: 13.3 % (ref 11.5–15.5)
WBC Count: 8.5 K/uL (ref 4.0–10.5)
nRBC: 0 % (ref 0.0–0.2)

## 2024-03-26 LAB — HEPATIC FUNCTION PANEL
ALT: 91 U/L — ABNORMAL HIGH (ref 0–44)
AST: 104 U/L — ABNORMAL HIGH (ref 15–41)
Albumin: 3.8 g/dL (ref 3.5–5.0)
Alkaline Phosphatase: 232 U/L — ABNORMAL HIGH (ref 38–126)
Bilirubin, Direct: 0.2 mg/dL (ref 0.0–0.2)
Indirect Bilirubin: 0.4 mg/dL (ref 0.3–0.9)
Total Bilirubin: 0.6 mg/dL (ref 0.0–1.2)
Total Protein: 7.6 g/dL (ref 6.5–8.1)

## 2024-03-26 LAB — LACTATE DEHYDROGENASE: LDH: 189 U/L (ref 105–235)

## 2024-03-26 NOTE — Progress Notes (Signed)
 [facility] OFFICE PROGRESS NOTE  Patient Care Team: Marylynn Verneita CROME, MD as PCP - General (Internal Medicine) Darliss Rogue, MD as PCP - Cardiology (Cardiology) Cindie Ole DASEN, MD as PCP - Electrophysiology (Cardiology) Dessa, Reyes ORN, MD (General Surgery) Rennie Cindy SAUNDERS, MD as Consulting Physician (Oncology)   Cancer Staging  No matching staging information was found for the patient.    Oncology History Overview Note   # NOV 2016- FOLLICULAR LYMPHOMA G-2; [s/p Bx- RETROPERITONEAL/LEFT Peri-aortic LN ~ 2.5CM [incidental on CT scan in 2016; CT- Nov 2014- ~1CM]/ PET 2016-Left Peri-aortic LN; Suv ~15; Ext Iliac LN 6 mm; Feb 2017- Unchanged LN; START Rituxan  q week x4 [finished march 15th]; PET JAN 30th 2018- NED.   # Hx of Shingles [Sep 2016]-   # Kidney cysts [previous Dr.Wolfe]; Afib on eliquis  [Dr.Brian; [facility]]  DIAGNOSIS: Follicle lymphoma-G-2  STAGE: Stage II       ;GOALS: Control  CURRENT/MOST RECENT THERAPY: Surveillance    Follicular lymphoma grade ii, intra-abdominal lymph nodes (HCC)    INTERVAL HISTORY:Accompanied by husband.  Walk independently.   Whitney Molina 83 y.o.  female pleasant patient above history of stage II follicular lymphoma-grade 2 on surveillance is here for follow-up.  Discussed the use of AI scribe software for clinical note transcription with the patient, who gave verbal consent to proceed.  History of Present Illness   Whitney Molina is an 83 year old female with follicular lymphoma who presents for follow-up.  She has a history of follicular lymphoma, grade two, stage two, and underwent treatment with single agent Rituxan  in 2016. She is currently on surveillance following her treatment. Her last treatment was approximately nine years ago. S  Her liver numbers were previously high, but recent follow-up including an ultrasound showed normal results. .  Her appetite is good, and her weight  has been stable at around 112-113 pounds. She mentions that she can eat without gaining weight.   Review of Systems  Constitutional:  Positive for malaise/fatigue and weight loss. Negative for chills, diaphoresis and fever.  HENT:  Negative for nosebleeds and sore throat.   Eyes:  Negative for double vision.  Respiratory:  Negative for cough, hemoptysis, sputum production, shortness of breath and wheezing.   Cardiovascular:  Negative for chest pain, palpitations, orthopnea and leg swelling.  Gastrointestinal:  Negative for abdominal pain, blood in stool, constipation, diarrhea, heartburn, melena, nausea and vomiting.  Genitourinary:  Negative for dysuria, frequency and urgency.  Musculoskeletal:  Negative for back pain and joint pain.  Skin: Negative.  Negative for itching and rash.  Neurological:  Negative for dizziness, tingling, focal weakness, weakness and headaches.  Endo/Heme/Allergies:  Does not bruise/bleed easily.  Psychiatric/Behavioral:  Negative for depression. The patient is not nervous/anxious and does not have insomnia.       PAST MEDICAL HISTORY :  Past Medical History:  Diagnosis Date   Atrial fibrillation (HCC)    Basal cell carcinoma of nose    Bronchitis    Follicular lymphoma (HCC)    GERD (gastroesophageal reflux disease)    Grade II diastolic dysfunction    Heart murmur    Hemorrhoids    History of basal cell carcinoma    right upper lip, right ant shoulder   History of colon polyps    History of dysplastic nevus 12/05/2001   left paraspinal upper back, upper back spinal   History of kidney stones    History of mammogram 2016  History of squamous cell carcinoma 02/07/2005   left ant chest   Hyperlipidemia    Hypertension    Mild aortic regurgitation    Mild cognitive impairment    Mild dementia with anxiety (HCC)    Mild tricuspid regurgitation by prior echocardiogram    Moderate mitral regurgitation by prior echocardiogram    Non Hodgkin's  lymphoma (HCC)    Non Hodgkin's lymphoma (HCC)    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   removed at [facility]    PAST SURGICAL HISTORY :   Past Surgical History:  Procedure Laterality Date   ABDOMINAL HYSTERECTOMY  1984   BLADDER SUSPENSION  2010   CARDIOVERSION N/A 11/30/2020   Procedure: CARDIOVERSION;  Surgeon: Darliss Rogue, MD;  Location: [facility];  Service: Cardiovascular;  Laterality: N/A;   CATARACT EXTRACTION W/PHACO Right 01/29/2023   Procedure: CATARACT EXTRACTION PHACO AND INTRAOCULAR LENS PLACEMENT (IOC) RIGHT 2.54 00:22.1;  Surgeon: Myrna Adine Anes, MD;  Location: [facility];  Service: Ophthalmology;  Laterality: Right;   CATARACT EXTRACTION W/PHACO Left 02/12/2023   Procedure: CATARACT EXTRACTION PHACO AND INTRAOCULAR LENS PLACEMENT (IOC) LEFT 4.09 00:29.3;  Surgeon: Myrna Adine Anes, MD;  Location: [facility];  Service: Ophthalmology;  Laterality: Left;   CHOLECYSTECTOMY  06-01-03   COLONOSCOPY  2003   EXTRACORPOREAL SHOCK WAVE LITHOTRIPSY Left 06/16/2021   Procedure: EXTRACORPOREAL SHOCK WAVE LITHOTRIPSY (ESWL);  Surgeon: Penne Knee, MD;  Location: [facility];  Service: Urology;  Laterality: Left;   LITHOTRIPSY  2005   local exicsion skin cancer  2004   squamous cell of chest wall. left chest   OOPHORECTOMY     TONSILLECTOMY  1971   TUBAL LIGATION  1975   VAGINAL PROLAPSE REPAIR  July 2001   Pelvic Prolapse    FAMILY HISTORY :   Family History  Problem Relation Age of Onset   Stroke Mother    Breast cancer Mother 69   Stroke Father    Dementia Father    Cancer Brother        bladder   Stroke Brother    Heart attack Brother    Breast cancer Paternal Aunt 71   Bipolar disorder Son     SOCIAL HISTORY:   Social History   Tobacco Use   Smoking status: Never   Smokeless tobacco: Never  Vaping Use   Vaping status: Never Used  Substance Use Topics   Alcohol use: No   Drug use: No    ALLERGIES:  has no known  allergies.  MEDICATIONS:  Current Outpatient Medications  Medication Sig Dispense Refill   albuterol  (VENTOLIN  HFA) 108 (90 Base) MCG/ACT inhaler INHALE 1 PUFFS INTO THE LUNGS EVERY 6 HOURS AS NEEDED FOR WHEEZING OR SHORTNESS OF BREATH 18 g 2   amiodarone  (PACERONE ) 200 MG tablet Take 1 tablet (200 mg total) by mouth daily. 90 tablet 3   amLODipine  (NORVASC ) 5 MG tablet Take 1 tablet (5 mg total) by mouth daily. 90 tablet 1   ELIQUIS  2.5 MG TABS tablet TAKE 1 TABLET BY MOUTH TWICE DAILY 180 tablet 1   KLOR-CON  M20 20 MEQ tablet TAKE 1 TABLET BY MOUTH TWICE DAILY 60 tablet 3   losartan  (COZAAR ) 100 MG tablet TAKE 1 TABLET BY MOUTH ONCE DAILY 90 tablet 1   pantoprazole  (PROTONIX ) 40 MG tablet Take 40 mg by mouth daily.     rosuvastatin  (CRESTOR ) 20 MG tablet TAKE 1 TABLET BY MOUTH AT BEDTIME 90 tablet 1  No current facility-administered medications for this visit.    PHYSICAL EXAMINATION: ECOG PERFORMANCE STATUS: 0 - Asymptomatic  BP (!) 147/71 (BP Location: Right Arm, Patient Position: Sitting, Cuff Size: Small) Comment: pt advised bp elevated, keep check at home, contact pcp if con't to stay elevated  Pulse (!) 56   Temp (!) 97.1 F (36.2 C) (Temporal)   Resp 20   Ht 5' 2 (1.575 m)   Wt 112 lb 8 oz (51 kg)   SpO2 100%   BMI 20.58 kg/m   Filed Weights   03/26/24 1310  Weight: 112 lb 8 oz (51 kg)      Physical Exam HENT:     Head: Normocephalic and atraumatic.     Mouth/Throat:     Pharynx: No oropharyngeal exudate.  Eyes:     Pupils: Pupils are equal, round, and reactive to light.  Cardiovascular:     Rate and Rhythm: Normal rate. Rhythm irregular.  Pulmonary:     Effort: Pulmonary effort is normal. No respiratory distress.     Breath sounds: Normal breath sounds. No wheezing.  Abdominal:     General: Bowel sounds are normal. There is no distension.     Palpations: Abdomen is soft. There is no mass.     Tenderness: There is no abdominal tenderness. There is no  guarding or rebound.  Musculoskeletal:        General: No tenderness. Normal range of motion.     Cervical back: Normal range of motion and neck supple.  Skin:    General: Skin is warm.  Neurological:     Mental Status: She is alert and oriented to person, place, and time.  Psychiatric:        Mood and Affect: Affect normal.     LABORATORY DATA:  I have reviewed the data as listed    Component Value Date/Time   NA 134 (L) 03/26/2024 1310   NA 142 01/17/2023 1407   NA 138 09/10/2013 0758   K 3.5 03/26/2024 1310   K 3.3 (L) 09/10/2013 0758   CL 101 03/26/2024 1310   CL 100 09/10/2013 0758   CO2 25 03/26/2024 1310   CO2 32 09/10/2013 0758   GLUCOSE 77 03/26/2024 1310   GLUCOSE 85 09/10/2013 0758   BUN 17 03/26/2024 1310   BUN 19 01/17/2023 1407   BUN 13 09/10/2013 0758   CREATININE 0.84 03/26/2024 1310   CREATININE 0.96 12/22/2013 0857   CALCIUM  8.7 (L) 03/26/2024 1310   CALCIUM  9.2 09/10/2013 0758   PROT 7.5 03/26/2024 1310   PROT 6.8 02/15/2023 1100   PROT 8.3 (H) 09/10/2013 0758   ALBUMIN 3.7 03/26/2024 1310   ALBUMIN 4.3 02/15/2023 1100   ALBUMIN 3.5 09/10/2013 0758   AST 104 (H) 03/26/2024 1310   ALT 90 (H) 03/26/2024 1310   ALT 28 09/10/2013 0758   ALKPHOS 237 (H) 03/26/2024 1310   ALKPHOS 78 09/10/2013 0758   BILITOT 0.6 03/26/2024 1310   GFRNONAA >60 03/26/2024 1310   GFRNONAA 59 (L) 12/22/2013 0857   GFRAA >60 01/21/2020 1309   GFRAA 68 12/22/2013 0857    No results found for: SPEP, UPEP  Lab Results  Component Value Date   WBC 8.5 03/26/2024   NEUTROABS 6.0 03/26/2024   HGB 12.0 03/26/2024   HCT 35.7 (L) 03/26/2024   MCV 96.2 03/26/2024   PLT 211 03/26/2024      Chemistry      Component Value Date/Time   NA 134 (L)  03/26/2024 1310   NA 142 01/17/2023 1407   NA 138 09/10/2013 0758   K 3.5 03/26/2024 1310   K 3.3 (L) 09/10/2013 0758   CL 101 03/26/2024 1310   CL 100 09/10/2013 0758   CO2 25 03/26/2024 1310   CO2 32 09/10/2013 0758    BUN 17 03/26/2024 1310   BUN 19 01/17/2023 1407   BUN 13 09/10/2013 0758   CREATININE 0.84 03/26/2024 1310   CREATININE 0.96 12/22/2013 0857      Component Value Date/Time   CALCIUM  8.7 (L) 03/26/2024 1310   CALCIUM  9.2 09/10/2013 0758   ALKPHOS 237 (H) 03/26/2024 1310   ALKPHOS 78 09/10/2013 0758   AST 104 (H) 03/26/2024 1310   ALT 90 (H) 03/26/2024 1310   ALT 28 09/10/2013 0758   BILITOT 0.6 03/26/2024 1310       RADIOGRAPHIC STUDIES: I have personally reviewed the radiological images as listed and agreed with the findings in the report. No results found.   ASSESSMENT & PLAN:  Follicular lymphoma grade ii, intra-abdominal lymph nodes (HCC) # RETROPERITONEAL LN [incidental/asymptomatic]- follicular lymphoma grade 2; likely stage II; status post rituximab . FEB 2019 PET scan NED- Clinically no evidence recurrence. will get scans on clinical basis-  stable.  # hypokalemia- 3.3 ? Laxatives/causing diarrhea. continue Kdur 20 meq - once a day Dr.Tullo/PCP.   # Elevated LFTs--intermittently-however elevated even in July; ultrasound 2025 negative for any acute process.  Recommend checking hepatitis panel; also MRCP.   # CKD- III-GFR 53-stable  [s/p Dr.Kolluru nephrology]   # A.fib- on eliquis / amiodarone  [Dr.Etang]- 2.5 mg stable.   # Tremors-benign versus others- off levbid - ? Memory issues-defer to PCP- stable.   # Hypocalcemia- ca- 8.7; on  ca+vit D- stable.   # DISPOSITION: # MRCP in 1 week-  # follow up in 6 months-MD/ labs-cbc/cmp,ldh-Dr.B  Cc: Dr.Tullo     Orders Placed This Encounter  Procedures   MR ABDOMEN MRCP W WO CONTAST    Standing Status:   Future    Expected Date:   04/02/2024    Expiration Date:   03/26/2025    If indicated for the ordered procedure, I authorize the administration of contrast media per Radiology protocol:   Yes    What is the patient's sedation requirement?:   No Sedation    Does the patient have a pacemaker or implanted devices?:    No    Preferred imaging location?:   [facility]   Hepatic function panel    Standing Status:   Future    Number of Occurrences:   1    Expiration Date:   03/26/2025   CBC with Differential (Cancer Center Only)    Standing Status:   Future    Expected Date:   09/23/2024    Expiration Date:   03/26/2025   CMP (Cancer Center only)    Standing Status:   Future    Expected Date:   09/23/2024    Expiration Date:   03/26/2025   Lactate dehydrogenase    Standing Status:   Future    Expected Date:   09/23/2024    Expiration Date:   03/26/2025   All questions were answered. The patient knows to call the clinic with any problems, questions or concerns.      Cindy JONELLE Joe, MD 03/26/2024 2:04 PM

## 2024-03-26 NOTE — Assessment & Plan Note (Addendum)
#   RETROPERITONEAL LN [incidental/asymptomatic]- follicular lymphoma grade 2; likely stage II; status post rituximab . FEB 2019 PET scan NED- Clinically no evidence recurrence. will get scans on clinical basis-  stable.  # hypokalemia- 3.3 ? Laxatives/causing diarrhea. continue Kdur 20 meq - once a day Dr.Tullo/PCP.   # Elevated LFTs--intermittently-however elevated even in July; ultrasound 2025 negative for any acute process.  Recommend checking hepatitis panel; also MRCP.   # CKD- III-GFR 53-stable  [s/p Dr.Kolluru nephrology]   # A.fib- on eliquis / amiodarone  [Dr.Etang]- 2.5 mg stable.   # Tremors-benign versus others- off levbid - ? Memory issues-defer to PCP- stable.   # Hypocalcemia- ca- 8.7; on  ca+vit D- stable.   # DISPOSITION: # MRCP in 1 week-  # follow up in 6 months-MD/ labs-cbc/cmp,ldh-Dr.B  Cc: Dr.Tullo

## 2024-03-26 NOTE — Progress Notes (Signed)
No concerns toady.

## 2024-04-01 NOTE — Telephone Encounter (Signed)
 open in error

## 2024-04-02 ENCOUNTER — Ambulatory Visit: Admission: RE | Admit: 2024-04-02 | Source: Ambulatory Visit

## 2024-04-02 ENCOUNTER — Other Ambulatory Visit: Payer: Self-pay | Admitting: Internal Medicine

## 2024-04-02 ENCOUNTER — Telehealth: Payer: Self-pay | Admitting: Internal Medicine

## 2024-04-02 DIAGNOSIS — C8213 Follicular lymphoma grade II, intra-abdominal lymph nodes: Secondary | ICD-10-CM

## 2024-04-02 DIAGNOSIS — R7989 Other specified abnormal findings of blood chemistry: Secondary | ICD-10-CM

## 2024-04-02 NOTE — Telephone Encounter (Signed)
 This patient husband Art) called answering service ands left msg to cancel her MRI today. She is too sick to come. I got this msg after time of appt, so they had cancelled b/c of NOSHOW.

## 2024-05-14 ENCOUNTER — Telehealth: Payer: Self-pay | Admitting: Internal Medicine

## 2024-05-14 NOTE — Telephone Encounter (Signed)
 Pt called to r/s MRI - transferred to [facility]

## 2024-05-17 ENCOUNTER — Ambulatory Visit
Admission: RE | Admit: 2024-05-17 | Discharge: 2024-05-17 | Disposition: A | Discharge status: Home / Self Care | Source: Ambulatory Visit | Attending: Internal Medicine | Admitting: Internal Medicine

## 2024-05-17 DIAGNOSIS — R7989 Other specified abnormal findings of blood chemistry: Secondary | ICD-10-CM | POA: Insufficient documentation

## 2024-05-17 DIAGNOSIS — C8213 Follicular lymphoma grade II, intra-abdominal lymph nodes: Secondary | ICD-10-CM | POA: Insufficient documentation

## 2024-05-17 MED ORDER — GADOBUTROL 1 MMOL/ML IV SOLN
5.0000 mL | Freq: Once | INTRAVENOUS | Status: AC | PRN
  Administered 2024-05-17: 5 mL via INTRAVENOUS

## 2024-05-19 ENCOUNTER — Other Ambulatory Visit: Payer: Self-pay | Admitting: Internal Medicine

## 2024-06-16 ENCOUNTER — Ambulatory Visit: Admitting: Internal Medicine

## 2024-07-04 ENCOUNTER — Ambulatory Visit: Admitting: Internal Medicine

## 2024-09-23 ENCOUNTER — Inpatient Hospital Stay: Admitting: Internal Medicine

## 2024-09-23 ENCOUNTER — Inpatient Hospital Stay
